# Patient Record
Sex: Male | Born: 1945 | Race: White | Hispanic: No | Marital: Married | Smoking: Former smoker
Health system: Southern US, Community
[De-identification: ages and names within clinical notes are randomized; demographics above are authoritative.]

## PROBLEM LIST (undated history)

## (undated) DIAGNOSIS — I252 Old myocardial infarction: Secondary | ICD-10-CM

## (undated) DIAGNOSIS — J309 Allergic rhinitis, unspecified: Secondary | ICD-10-CM

## (undated) DIAGNOSIS — G4733 Obstructive sleep apnea (adult) (pediatric): Secondary | ICD-10-CM

## (undated) DIAGNOSIS — K219 Gastro-esophageal reflux disease without esophagitis: Secondary | ICD-10-CM

## (undated) DIAGNOSIS — E785 Hyperlipidemia, unspecified: Secondary | ICD-10-CM

## (undated) DIAGNOSIS — Z87442 Personal history of urinary calculi: Secondary | ICD-10-CM

## (undated) DIAGNOSIS — J1282 Pneumonia due to coronavirus disease 2019: Secondary | ICD-10-CM

## (undated) DIAGNOSIS — J45909 Unspecified asthma, uncomplicated: Secondary | ICD-10-CM

## (undated) DIAGNOSIS — I10 Essential (primary) hypertension: Secondary | ICD-10-CM

## (undated) DIAGNOSIS — U071 COVID-19: Secondary | ICD-10-CM

## (undated) DIAGNOSIS — Z955 Presence of coronary angioplasty implant and graft: Secondary | ICD-10-CM

## (undated) DIAGNOSIS — T7840XA Allergy, unspecified, initial encounter: Secondary | ICD-10-CM

## (undated) DIAGNOSIS — N189 Chronic kidney disease, unspecified: Secondary | ICD-10-CM

## (undated) DIAGNOSIS — R001 Bradycardia, unspecified: Secondary | ICD-10-CM

## (undated) DIAGNOSIS — S83206A Unspecified tear of unspecified meniscus, current injury, right knee, initial encounter: Secondary | ICD-10-CM

## (undated) DIAGNOSIS — Z808 Family history of malignant neoplasm of other organs or systems: Secondary | ICD-10-CM

## (undated) DIAGNOSIS — Z87898 Personal history of other specified conditions: Secondary | ICD-10-CM

## (undated) DIAGNOSIS — H269 Unspecified cataract: Secondary | ICD-10-CM

## (undated) DIAGNOSIS — G4730 Sleep apnea, unspecified: Secondary | ICD-10-CM

## (undated) DIAGNOSIS — I2510 Atherosclerotic heart disease of native coronary artery without angina pectoris: Secondary | ICD-10-CM

## (undated) HISTORY — DX: Sleep apnea, unspecified: G47.30

## (undated) HISTORY — DX: Chronic kidney disease, unspecified: N18.9

## (undated) HISTORY — PX: LUMBAR SPINE SURGERY: SHX701

## (undated) HISTORY — PX: COLONOSCOPY: SHX174

## (undated) HISTORY — DX: Atherosclerotic heart disease of native coronary artery without angina pectoris: I25.10

## (undated) HISTORY — PX: TONSILLECTOMY: SUR1361

## (undated) HISTORY — DX: Unspecified cataract: H26.9

## (undated) HISTORY — PX: LITHOTRIPSY: SUR834

## (undated) HISTORY — DX: Allergy, unspecified, initial encounter: T78.40XA

## (undated) HISTORY — DX: Hyperlipidemia, unspecified: E78.5

## (undated) HISTORY — PX: EXTRACORPOREAL SHOCK WAVE LITHOTRIPSY: SHX1557

## (undated) HISTORY — DX: Pneumonia due to coronavirus disease 2019: J12.82

## (undated) HISTORY — DX: COVID-19: U07.1

## (undated) HISTORY — DX: Essential (primary) hypertension: I10

## (undated) HISTORY — DX: Family history of malignant neoplasm of other organs or systems: Z80.8

## (undated) HISTORY — PX: CATARACT EXTRACTION, BILATERAL: SHX1313

---

## 2004-04-17 ENCOUNTER — Ambulatory Visit: Payer: Self-pay | Admitting: Internal Medicine

## 2004-08-08 ENCOUNTER — Ambulatory Visit: Payer: Self-pay | Admitting: Internal Medicine

## 2005-01-10 ENCOUNTER — Ambulatory Visit: Payer: Self-pay | Admitting: Internal Medicine

## 2005-01-14 ENCOUNTER — Ambulatory Visit: Payer: Self-pay | Admitting: Internal Medicine

## 2006-02-02 ENCOUNTER — Ambulatory Visit: Payer: Self-pay | Admitting: Internal Medicine

## 2007-01-22 DIAGNOSIS — J309 Allergic rhinitis, unspecified: Secondary | ICD-10-CM | POA: Insufficient documentation

## 2007-01-22 DIAGNOSIS — K219 Gastro-esophageal reflux disease without esophagitis: Secondary | ICD-10-CM | POA: Insufficient documentation

## 2007-01-22 DIAGNOSIS — R05 Cough: Secondary | ICD-10-CM

## 2007-01-22 DIAGNOSIS — J45909 Unspecified asthma, uncomplicated: Secondary | ICD-10-CM | POA: Insufficient documentation

## 2007-01-25 ENCOUNTER — Encounter: Payer: Self-pay | Admitting: Internal Medicine

## 2007-02-25 ENCOUNTER — Ambulatory Visit: Payer: Self-pay | Admitting: Internal Medicine

## 2007-11-16 ENCOUNTER — Telehealth (INDEPENDENT_AMBULATORY_CARE_PROVIDER_SITE_OTHER): Payer: Self-pay | Admitting: *Deleted

## 2009-04-05 ENCOUNTER — Encounter: Payer: Self-pay | Admitting: Internal Medicine

## 2009-09-27 ENCOUNTER — Encounter: Admission: RE | Admit: 2009-09-27 | Discharge: 2009-09-27 | Payer: Self-pay | Admitting: Podiatry

## 2010-02-10 ENCOUNTER — Encounter: Payer: Self-pay | Admitting: Podiatry

## 2010-02-19 NOTE — Medication Information (Signed)
Summary: CIG[employer]  CIGNA   Imported By: Lester [place] 04/10/2009 10:15:38  _____________________________________________________________________  External Attachment:    Type:   Image     Comment:   External Document

## 2010-06-07 NOTE — Assessment & Plan Note (Signed)
[facility]                             PULMONARY OFFICE NOTE   NAME:[name], Berger                    MRN:          [id number]  DATE:02/02/2006                            DOB:          [birth year]    PROBLEM LIST:  1. Asthma.  2. Allergic rhinitis.  3. He dropped off of allergy vaccine since 6 months ago when the      pharmacy he used stopped carrying the particular size allergy      syringes he had been using. So far he has not had changes and I      would not expect any. He keeps a mild chronic nonproductive cough      which is unchanged. He denies nasal congestion and says he is not      aware of reflux although it was bad in the past. He usually      expects to cough a little more in the winter. 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   MEDICATIONS:  1. Advair 100/50.  2. Flonase.  3. Allegra D used only in the morning.  4. Occasional plain Allegra 60 mg.  5. Albuterol inhaler.   No medication allergy.   OBJECTIVE:  Weight 190 pounds, blood pressure 140/82, pulse regular 71,  room air saturation 98%. Lung fields sound clear. He seemed to clear his  throat once. Pharynx is not red, there was no stridor. No evident post  nasal drainage or nasal congestion. Heart sounds regular.   IMPRESSION:  1. Allergic asthma.  2. Allergic rhinitis.  3. Esophageal reflux.  4. Cough probably related to all of the above.   PLAN:  Dust and environmental precautions, reflux precautions. Refill  albuterol inhaler. Schedule return in 1 year, earlier p.r.n.     Clinton D. Maple Hudson, MD, Tonny Bollman, FACP  Electronically Signed    CDY/MedQ  DD: 02/02/2006  DT: 02/03/2006  Job #: [id number]   cc:   Wyvonnia Lora

## 2011-01-21 HISTORY — PX: SHOULDER ARTHROSCOPY: SHX128

## 2013-01-20 DIAGNOSIS — I219 Acute myocardial infarction, unspecified: Secondary | ICD-10-CM

## 2013-01-20 HISTORY — DX: Acute myocardial infarction, unspecified: I21.9

## 2013-08-04 ENCOUNTER — Encounter (HOSPITAL_COMMUNITY): Payer: Self-pay | Admitting: Emergency Medicine

## 2013-08-04 ENCOUNTER — Emergency Department (HOSPITAL_COMMUNITY): Payer: Medicare HMO

## 2013-08-04 ENCOUNTER — Inpatient Hospital Stay (HOSPITAL_COMMUNITY)
Admission: EM | Admit: 2013-08-04 | Discharge: 2013-08-06 | DRG: 247 | Disposition: A | Discharge status: Home / Self Care | Payer: Medicare HMO | Attending: Internal Medicine | Admitting: Internal Medicine

## 2013-08-04 DIAGNOSIS — E785 Hyperlipidemia, unspecified: Secondary | ICD-10-CM | POA: Diagnosis present

## 2013-08-04 DIAGNOSIS — I214 Non-ST elevation (NSTEMI) myocardial infarction: Principal | ICD-10-CM | POA: Diagnosis present

## 2013-08-04 DIAGNOSIS — R0789 Other chest pain: Secondary | ICD-10-CM | POA: Diagnosis present

## 2013-08-04 DIAGNOSIS — E876 Hypokalemia: Secondary | ICD-10-CM | POA: Diagnosis not present

## 2013-08-04 DIAGNOSIS — Z955 Presence of coronary angioplasty implant and graft: Secondary | ICD-10-CM

## 2013-08-04 DIAGNOSIS — K219 Gastro-esophageal reflux disease without esophagitis: Secondary | ICD-10-CM | POA: Diagnosis present

## 2013-08-04 DIAGNOSIS — R059 Cough, unspecified: Secondary | ICD-10-CM

## 2013-08-04 DIAGNOSIS — I2510 Atherosclerotic heart disease of native coronary artery without angina pectoris: Secondary | ICD-10-CM | POA: Diagnosis present

## 2013-08-04 DIAGNOSIS — I25110 Atherosclerotic heart disease of native coronary artery with unstable angina pectoris: Secondary | ICD-10-CM

## 2013-08-04 DIAGNOSIS — E7800 Pure hypercholesterolemia, unspecified: Secondary | ICD-10-CM | POA: Diagnosis present

## 2013-08-04 DIAGNOSIS — R079 Chest pain, unspecified: Secondary | ICD-10-CM

## 2013-08-04 DIAGNOSIS — R072 Precordial pain: Secondary | ICD-10-CM

## 2013-08-04 DIAGNOSIS — I252 Old myocardial infarction: Secondary | ICD-10-CM

## 2013-08-04 DIAGNOSIS — J45909 Unspecified asthma, uncomplicated: Secondary | ICD-10-CM | POA: Diagnosis present

## 2013-08-04 DIAGNOSIS — J309 Allergic rhinitis, unspecified: Secondary | ICD-10-CM

## 2013-08-04 DIAGNOSIS — Z8249 Family history of ischemic heart disease and other diseases of the circulatory system: Secondary | ICD-10-CM

## 2013-08-04 DIAGNOSIS — R05 Cough: Secondary | ICD-10-CM

## 2013-08-04 LAB — CBC
HEMATOCRIT: 43.4 % (ref 39.0–52.0)
HEMATOCRIT: 42.9 % (ref 39.0–52.0)
HEMOGLOBIN: 14.8 g/dL (ref 13.0–17.0)
Hemoglobin: 15.3 g/dL (ref 13.0–17.0)
MCH: 29.1 pg (ref 26.0–34.0)
MCH: 28.6 pg (ref 26.0–34.0)
MCHC: 35.3 g/dL (ref 30.0–36.0)
MCHC: 34.5 g/dL (ref 30.0–36.0)
MCV: 82.7 fL (ref 78.0–100.0)
MCV: 83 fL (ref 78.0–100.0)
PLATELETS: 213 10*3/uL (ref 150–400)
Platelets: 206 10*3/uL (ref 150–400)
RBC: 5.25 MIL/uL (ref 4.22–5.81)
RBC: 5.17 MIL/uL (ref 4.22–5.81)
RDW: 12.8 % (ref 11.5–15.5)
RDW: 12.8 % (ref 11.5–15.5)
WBC: 9.7 10*3/uL (ref 4.0–10.5)
WBC: 11.6 10*3/uL — ABNORMAL HIGH (ref 4.0–10.5)

## 2013-08-04 LAB — BASIC METABOLIC PANEL
Anion gap: 16 — ABNORMAL HIGH (ref 5–15)
BUN: 14 mg/dL (ref 6–23)
CALCIUM: 9.5 mg/dL (ref 8.4–10.5)
CO2: 24 mEq/L (ref 19–32)
Chloride: 103 mEq/L (ref 96–112)
Creatinine, Ser: 1.11 mg/dL (ref 0.50–1.35)
GFR calc Af Amer: 79 mL/min — ABNORMAL LOW (ref >90)
GFR, EST NON AFRICAN AMERICAN: 68 mL/min — AB (ref >90)
Glucose, Bld: 105 mg/dL — ABNORMAL HIGH (ref 70–99)
Potassium: 3.5 mEq/L — ABNORMAL LOW (ref 3.7–5.3)
Sodium: 143 mEq/L (ref 137–147)

## 2013-08-04 LAB — LIPID PANEL
CHOLESTEROL: 233 mg/dL — AB (ref 0–200)
HDL: 38 mg/dL — ABNORMAL LOW (ref >39)
LDL Cholesterol: 154 mg/dL — ABNORMAL HIGH (ref 0–99)
TRIGLYCERIDES: 204 mg/dL — AB (ref <150)
Total CHOL/HDL Ratio: 6.1 RATIO
VLDL: 41 mg/dL — AB (ref 0–40)

## 2013-08-04 LAB — CREATININE, SERUM
Creatinine, Ser: 1.07 mg/dL (ref 0.50–1.35)
GFR calc Af Amer: 82 mL/min — ABNORMAL LOW (ref >90)
GFR calc non Af Amer: 71 mL/min — ABNORMAL LOW (ref >90)

## 2013-08-04 LAB — TROPONIN I: Troponin I: 3.54 ng/mL (ref <0.30)

## 2013-08-04 LAB — PRO B NATRIURETIC PEPTIDE: Pro B Natriuretic peptide (BNP): 83.4 pg/mL (ref 0–125)

## 2013-08-04 LAB — I-STAT TROPONIN, ED: Troponin i, poc: 0.07 ng/mL (ref 0.00–0.08)

## 2013-08-04 LAB — TSH: TSH: 3.33 u[IU]/mL (ref 0.350–4.500)

## 2013-08-04 LAB — MAGNESIUM: Magnesium: 1.9 mg/dL (ref 1.5–2.5)

## 2013-08-04 MED ORDER — ONDANSETRON HCL 4 MG/2ML IJ SOLN
4.0000 mg | Freq: Four times a day (QID) | INTRAMUSCULAR | Status: DC | PRN
Start: 2013-08-04 — End: 2013-08-06

## 2013-08-04 MED ORDER — POTASSIUM CHLORIDE CRYS ER 20 MEQ PO TBCR
40.0000 meq | EXTENDED_RELEASE_TABLET | Freq: Once | ORAL | Status: AC
  Administered 2013-08-04: 40 meq via ORAL
  Filled 2013-08-04: qty 2

## 2013-08-04 MED ORDER — METOPROLOL TARTRATE 12.5 MG HALF TABLET
12.5000 mg | ORAL_TABLET | Freq: Two times a day (BID) | ORAL | Status: DC
  Administered 2013-08-04 – 2013-08-06 (×4): 12.5 mg via ORAL
  Filled 2013-08-04 (×6): qty 1

## 2013-08-04 MED ORDER — ATORVASTATIN CALCIUM 80 MG PO TABS
80.0000 mg | ORAL_TABLET | Freq: Every day | ORAL | Status: DC
  Administered 2013-08-04: 80 mg via ORAL
  Filled 2013-08-04 (×3): qty 1

## 2013-08-04 MED ORDER — ASPIRIN 81 MG PO CHEW
162.0000 mg | CHEWABLE_TABLET | Freq: Once | ORAL | Status: AC
  Administered 2013-08-04: 162 mg via ORAL
  Filled 2013-08-04: qty 2

## 2013-08-04 MED ORDER — HEPARIN BOLUS VIA INFUSION
4000.0000 [IU] | Freq: Once | INTRAVENOUS | Status: AC
Start: 2013-08-04 — End: 2013-08-04
  Administered 2013-08-04: 4000 [IU] via INTRAVENOUS
  Filled 2013-08-04: qty 4000

## 2013-08-04 MED ORDER — HEPARIN (PORCINE) IN NACL 100-0.45 UNIT/ML-% IJ SOLN
1200.0000 [IU]/h | INTRAMUSCULAR | Status: DC
  Administered 2013-08-04: 1200 [IU]/h via INTRAVENOUS
  Filled 2013-08-04 (×2): qty 250

## 2013-08-04 MED ORDER — NITROGLYCERIN 0.4 MG SL SUBL
0.4000 mg | SUBLINGUAL_TABLET | SUBLINGUAL | Status: DC | PRN
  Administered 2013-08-04 (×2): 0.4 mg via SUBLINGUAL
  Filled 2013-08-04: qty 1

## 2013-08-04 MED ORDER — ASPIRIN EC 325 MG PO TBEC: 325.0000 mg | DELAYED_RELEASE_TABLET | Freq: Every day | ORAL | Status: DC

## 2013-08-04 MED ORDER — ZOLPIDEM TARTRATE 5 MG PO TABS
5.0000 mg | ORAL_TABLET | Freq: Every evening | ORAL | Status: DC | PRN
  Administered 2013-08-04: 5 mg via ORAL
  Filled 2013-08-04: qty 1

## 2013-08-04 MED ORDER — HEPARIN SODIUM (PORCINE) 5000 UNIT/ML IJ SOLN: 5000.0000 [IU] | Freq: Three times a day (TID) | INTRAMUSCULAR | Status: DC

## 2013-08-04 MED ORDER — ACETAMINOPHEN 325 MG PO TABS: 650.0000 mg | ORAL_TABLET | ORAL | Status: DC | PRN

## 2013-08-04 NOTE — Consult Note (Signed)
CONSULTATION NOTE  Reason for Consult: Chest pain  Requesting Physician: Dr. Ernestina Patches  Cardiologist: None (NEW)  HPI: This is a 68 y.o. male with a past medical history significant for hyperlipidemia. He presents today with chest pain and pressure while working under his house. He felt somewhat dehydrated and try to  Drink some water but this did not improve his symptoms. He developed progressive onset of nausea shortness of breath and sweatiness. He was given aspirin with some relief in his symptoms and brought to the emergency room with concern for  Unstable angina.  His symptoms have improved once reaching the emergency room. No specific abnormalities were noted on his blood work. His first troponin was negative. There is subtle less than 1 mm ST depression anterolaterally on his EKG.  He was given sublingual nitroglycerin on arrival to the ER which totally resolved his chest pressure.  There is a family history of heart disease in his mother and sister.  PMHx:  History reviewed. No pertinent past medical history. Past Surgical History  Procedure Laterality Date  . Back surgery      FAMHx: Family History  Problem Relation Age of Onset  . Coronary artery disease Mother     CABG  . CAD Sister   . Alzheimer's disease Father     SOCHx:  reports that he has never smoked. He does not have any smokeless tobacco history on file. He reports that he does not drink alcohol. His drug history is not on file.  ALLERGIES: Allergies  Allergen Reactions  . Percocet [Oxycodone-Acetaminophen]     Nausea     ROS: A comprehensive review of systems was negative except for: Cardiovascular: positive for chest pressure/discomfort Gastrointestinal: positive for nausea and diaphoresis  HOME MEDICATIONS:   Medication List    ASK your doctor about these medications       zolpidem 10 MG tablet  Commonly known as:  AMBIEN  Take 10 mg by mouth at bedtime.        HOSPITAL  MEDICATIONS: Prior to Admission:  (Not in a hospital admission)  VITALS: Blood pressure 141/68, pulse 59, temperature 97.6 F (36.4 C), temperature source Oral, resp. rate 20, SpO2 100.00%.  PHYSICAL EXAM: General appearance: alert and no distress Neck: no carotid bruit and no JVD Lungs: clear to auscultation bilaterally Heart: regular rate and rhythm, S1, S2 normal, no murmur, click, rub or gallop Abdomen: soft, non-tender; bowel sounds normal; no masses,  no organomegaly Extremities: extremities normal, atraumatic, no cyanosis or edema Pulses: 2+ and symmetric Skin: Skin color, texture, turgor normal. No rashes or lesions Neurologic: Grossly normal Psych: Normal  LABS: Results for orders placed during the hospital encounter of 08/04/13 (from the past 48 hour(s))  CBC     Status: None   Collection Time    08/04/13  4:08 PM      Result Value Ref Range   WBC 9.7  4.0 - 10.5 K/uL   RBC 5.25  4.22 - 5.81 MIL/uL   Hemoglobin 15.3  13.0 - 17.0 g/dL   HCT 43.4  39.0 - 52.0 %   MCV 82.7  78.0 - 100.0 fL   MCH 29.1  26.0 - 34.0 pg   MCHC 35.3  30.0 - 36.0 g/dL   RDW 12.8  11.5 - 15.5 %   Platelets 213  150 - 400 K/uL  BASIC METABOLIC PANEL     Status: Abnormal   Collection Time    08/04/13  4:08 PM  Result Value Ref Range   Sodium 143  137 - 147 mEq/L   Potassium 3.5 (*) 3.7 - 5.3 mEq/L   Chloride 103  96 - 112 mEq/L   CO2 24  19 - 32 mEq/L   Glucose, Bld 105 (*) 70 - 99 mg/dL   BUN 14  6 - 23 mg/dL   Creatinine, Ser 1.11  0.50 - 1.35 mg/dL   Calcium 9.5  8.4 - 10.5 mg/dL   GFR calc non Af Amer 68 (*) >90 mL/min   GFR calc Af Amer 79 (*) >90 mL/min   Comment: (NOTE)     The eGFR has been calculated using the CKD EPI equation.     This calculation has not been validated in all clinical situations.     eGFR's persistently <90 mL/min signify possible Chronic Kidney     Disease.   Anion gap 16 (*) 5 - 15  PRO B NATRIURETIC PEPTIDE     Status: None   Collection Time     08/04/13  4:08 PM      Result Value Ref Range   Pro B Natriuretic peptide (BNP) 83.4  0 - 125 pg/mL  I-STAT TROPOININ, ED     Status: None   Collection Time    08/04/13  4:13 PM      Result Value Ref Range   Troponin i, poc 0.07  0.00 - 0.08 ng/mL   Comment 3            Comment: Due to the release kinetics of cTnI,     a negative result within the first hours     of the onset of symptoms does not rule out     myocardial infarction with certainty.     If myocardial infarction is still suspected,     repeat the test at appropriate intervals.  CBC     Status: Abnormal   Collection Time    08/04/13  7:40 PM      Result Value Ref Range   WBC 11.6 (*) 4.0 - 10.5 K/uL   RBC 5.17  4.22 - 5.81 MIL/uL   Hemoglobin 14.8  13.0 - 17.0 g/dL   HCT 42.9  39.0 - 52.0 %   MCV 83.0  78.0 - 100.0 fL   MCH 28.6  26.0 - 34.0 pg   MCHC 34.5  30.0 - 36.0 g/dL   RDW 12.8  11.5 - 15.5 %   Platelets 206  150 - 400 K/uL    IMAGING: Dg Chest 2 View  08/04/2013   CLINICAL DATA:  Chest pain, shortness of breath  EXAM: CHEST  2 VIEW  COMPARISON:  None.  FINDINGS: Normal cardiac silhouette and mediastinal contours. There is mild elevation of the right hemidiaphragm. No focal parenchymal opacities. No pleural effusion or pneumothorax. No evidence of edema. No acute osseus abnormalities.  IMPRESSION: No acute cardiopulmonary disease.   Electronically Signed   By: Sandi Mariscal M.D.   On: 08/04/2013 18:10    HOSPITAL DIAGNOSES: Principal Problem:   Chest pain   IMPRESSION: 1. Chest pain, symptoms concerning for possible unstable angina  RECOMMENDATION: 1. Mr. 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 The symptoms were somewhat relieved with aspirin and totally relieved with 2 nitroglycerin. EKG shows subtle anterolateral ST depression. I agree with admission to evaluate for ACS. I would hold off on heparin for now unless he develops chest pain, positive troponin or  dynamic  EKG changes. Please keep NPO p MN for myoview stress test (if cardiac enzymes are negative) or cardiac catheterization if he rules-in.  Thanks for consulting [place].  Time Spent Directly with Patient: 45 minutes  Pixie Casino, MD, [facility] Attending Cardiologist [facility]  Ranald Alessio C 08/04/2013, 7:59 PM

## 2013-08-04 NOTE — ED Notes (Signed)
Dr. Walton at bedside 

## 2013-08-04 NOTE — Progress Notes (Signed)
CRITICAL VALUE ALERT  Critical value received: troponin 3.54  Date of notification:  08/04/2013  Time of notification:  2034  Critical value read back:Yes.    Nurse who received alert:  Nevin Bloodgood  MD notified (1st page):  Monica Becton  Time of first page:  2035  MD notified (2nd page):  Time of second page:  Responding MD:  Jules Husbands, MD  Time MD responded:  2038  New orders to start the pt on Heparin Iv. Will continue to monitor the pt. Hoover Brunette, RN

## 2013-08-04 NOTE — H&P (Signed)
Hospitalist Admission History and Physical  Patient name: Ronald Berger Medical record number: [id number] Date of birth: [birth year] Age: 68 y.o. Gender: male  Primary Care Provider: No primary provider on file.  Chief Complaint: chest pain  History of Present Illness:This is a 68 y.o. year old male with significant past medical history of HLD  presenting with chest pain. 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 Stopped working and drank some water. Pt states that sxs seemed to worsen over this time frame with progressive onset of nausea, dyspnea and diaphoresis. Contacted his wife and son about this time. Son arrived and gave him ASA with some improvement in sxs. Was brought to the ER for further evaluation. Pt denies any prior hx/o sxs like this in the past. CP is predominantly central without radiation and was initally fairly constant.  On presentation, hemodynamically stable. Blood work essentially WNL. Trop neg x1. Initial EKG with faint t wave abnormalities in lateral leads. Was given sub lingual NTG x2 with near resolution of chest pressure.  Heart score: 6 CV RFs: age, hx/o HLD, + family history   Assessment and Plan: Ronald Berger is a 68 y.o. year old male presenting with Chest Pain   Chest Pain: fairly typical sxs on presentation. No active CP currently. Full dose ASA. Prn NTG. Cards consult pending. Risk stratification labs. Telemetry bed.   FEN/GI: heart healthy diet. NPO PMN Prophylaxis: sub q heparin  Disposition: pending further evaluation  Code Status:Full code    Patient Active Problem List   Diagnosis Date Noted  . Chest pain 08/04/2013  . ALLERGIC RHINITIS 01/22/2007  . ASTHMA 01/22/2007  . ESOPHAGEAL REFLUX 01/22/2007  . COUGH 01/22/2007   Past Medical History: History reviewed. No pertinent past medical history.  Past Surgical History: Past Surgical History  Procedure  Laterality Date  . Back surgery      Social History: History   Social History  . Marital Status: Married    Spouse Name: N/A    Number of Children: N/A  . Years of Education: N/A   Social History Main Topics  . Smoking status: Never Smoker   . Smokeless tobacco: None  . Alcohol Use: No  . Drug Use: None  . Sexual Activity: None   Other Topics Concern  . None   Social History Narrative  . None    Family History: No family history on file.  Allergies: Allergies  Allergen Reactions  . Percocet [Oxycodone-Acetaminophen]     Nausea     Current Facility-Administered Medications  Medication Dose Route Frequency Provider Last Rate Last Dose  . acetaminophen (TYLENOL) tablet 650 mg  650 mg Oral Q4H PRN Shanda Howells, MD      . aspirin EC tablet 325 mg  325 mg Oral Daily Shanda Howells, MD      . heparin injection 5,000 Units  5,000 Units Subcutaneous 3 times per day Shanda Howells, MD      . nitroGLYCERIN (NITROSTAT) SL tablet 0.4 mg  0.4 mg Sublingual Q5 min PRN Freddi Che, MD   0.4 mg at 08/04/13 1832  . ondansetron (ZOFRAN) injection 4 mg  4 mg Intravenous Q6H PRN Shanda Howells, MD       Current Outpatient Prescriptions  Medication Sig Dispense Refill  . zolpidem (AMBIEN) 10 MG tablet Take 10 mg by mouth at bedtime.       Review Of Systems: 12 point ROS negative except as noted  above in HPI.  Physical Exam: Filed Vitals:   08/04/13 1815  BP: 143/81  Pulse: 52  Temp:   Resp: 8    General: alert and cooperative HEENT: PERRLA and extra ocular movement intact Heart: S1, S2 normal, no murmur, rub or gallop, regular rate and rhythm Lungs: clear to auscultation, no wheezes or rales and unlabored breathing Abdomen: abdomen is soft without significant tenderness, masses, organomegaly or guarding Extremities: extremities normal, atraumatic, no cyanosis or edema Skin:no rashes Neurology: normal without focal findings  Labs and Imaging: Lab Results  Component  Value Date/Time   NA 143 08/04/2013  4:08 PM   K 3.5* 08/04/2013  4:08 PM   CL 103 08/04/2013  4:08 PM   CO2 24 08/04/2013  4:08 PM   BUN 14 08/04/2013  4:08 PM   CREATININE 1.11 08/04/2013  4:08 PM   GLUCOSE 105* 08/04/2013  4:08 PM   Lab Results  Component Value Date   WBC 9.7 08/04/2013   HGB 15.3 08/04/2013   HCT 43.4 08/04/2013   MCV 82.7 08/04/2013   PLT 213 08/04/2013    Dg Chest 2 View  08/04/2013   CLINICAL DATA:  Chest pain, shortness of breath  EXAM: CHEST  2 VIEW  COMPARISON:  None.  FINDINGS: Normal cardiac silhouette and mediastinal contours. There is mild elevation of the right hemidiaphragm. No focal parenchymal opacities. No pleural effusion or pneumothorax. No evidence of edema. No acute osseus abnormalities.  IMPRESSION: No acute cardiopulmonary disease.   Electronically Signed   By: Sandi Mariscal M.D.   On: 08/04/2013 18:10           Shanda Howells MD  Pager: [phone]

## 2013-08-04 NOTE — ED Provider Notes (Signed)
CSN: [id number]     Arrival date & time 08/04/13  1543 History   First MD Initiated Contact with Patient 08/04/13 1702     Chief Complaint  Patient presents with  . Chest Pain     (Consider location/radiation/quality/duration/timing/severity/associated sxs/prior Treatment) Patient is a 68 y.o. male presenting with chest pain.  Chest Pain Pain location:  Substernal area and R chest Pain quality: pressure   Pain radiates to:  Mid back Pain radiates to the back: yes   Pain severity:  Severe Onset quality:  Gradual Timing:  Constant Progression:  Partially resolved Chronicity:  New Relieved by:  None tried Associated symptoms: diaphoresis, nausea and shortness of breath   Associated symptoms: no abdominal pain, no back pain, no cough, no fever, no headache and not vomiting   Risk factors: male sex   Risk factors: no coronary artery disease, no diabetes mellitus, no high cholesterol, no hypertension, no prior DVT/PE and no smoking     History reviewed. No pertinent past medical history. Past Surgical History  Procedure Laterality Date  . Back surgery     Family History  Problem Relation Age of Onset  . Coronary artery disease Mother     CABG  . CAD Sister   . Alzheimer's disease Father    History  Substance Use Topics  . Smoking status: Never Smoker   . Smokeless tobacco: Not on file  . Alcohol Use: No    Review of Systems  Constitutional: Positive for diaphoresis. Negative for fever and chills.  HENT: Negative for sore throat.   Eyes: Negative for pain.  Respiratory: Positive for shortness of breath. Negative for cough.   Cardiovascular: Positive for chest pain.  Gastrointestinal: Positive for nausea. Negative for vomiting and abdominal pain.  Genitourinary: Negative for dysuria and flank pain.  Musculoskeletal: Negative for back pain and neck pain.  Skin: Negative for rash.  Neurological: Negative for seizures and headaches.      Allergies  Percocet  Home  Medications   Prior to Admission medications   Medication Sig Start Date End Date Taking? Authorizing Provider  zolpidem (AMBIEN) 10 MG tablet Take 10 mg by mouth at bedtime.   Yes Historical Provider, MD   BP 168/82  Pulse 59  Temp(Src) 97.7 F (36.5 C) (Oral)  Resp 20  Ht 5\' 9"  (1.753 m)  Wt 197 lb 4.8 oz (89.495 kg)  BMI 29.12 kg/m2  SpO2 99% Physical Exam  Constitutional: He is oriented to person, place, and time. He appears well-developed and well-nourished. No distress.  HENT:  Head: Normocephalic and atraumatic.  Eyes: Pupils are equal, round, and reactive to light.  Neck: Normal range of motion.  Cardiovascular: Normal rate and regular rhythm.   Pulmonary/Chest: Effort normal and breath sounds normal.  Abdominal: Soft. He exhibits no distension. There is no tenderness.  Musculoskeletal: Normal range of motion.  Neurological: He is alert and oriented to person, place, and time.  Skin: Skin is warm. He is not diaphoretic.    ED Course  Procedures (including critical care time) Labs Review Labs Reviewed  BASIC METABOLIC PANEL - Abnormal; Notable for the following:    Potassium 3.5 (*)    Glucose, Bld 105 (*)    GFR calc non Af Amer 68 (*)    GFR calc Af Amer 79 (*)    Anion gap 16 (*)    All other components within normal limits  TROPONIN I - Abnormal; Notable for the following:    Troponin I  3.54 (*)    All other components within normal limits  CBC - Abnormal; Notable for the following:    WBC 11.6 (*)    All other components within normal limits  CREATININE, SERUM - Abnormal; Notable for the following:    GFR calc non Af Amer 71 (*)    GFR calc Af Amer 82 (*)    All other components within normal limits  LIPID PANEL - Abnormal; Notable for the following:    Cholesterol 233 (*)    Triglycerides 204 (*)    HDL 38 (*)    VLDL 41 (*)    LDL Cholesterol 154 (*)    All other components within normal limits  CBC  PRO B NATRIURETIC PEPTIDE  TSH  MAGNESIUM   HEMOGLOBIN A1C  HEPARIN LEVEL (UNFRACTIONATED)  CBC  TROPONIN I  TROPONIN I  I-STAT TROPOININ, ED    Imaging Review Dg Chest 2 View  08/04/2013   CLINICAL DATA:  Chest pain, shortness of breath  EXAM: CHEST  2 VIEW  COMPARISON:  None.  FINDINGS: Normal cardiac silhouette and mediastinal contours. There is mild elevation of the right hemidiaphragm. No focal parenchymal opacities. No pleural effusion or pneumothorax. No evidence of edema. No acute osseus abnormalities.  IMPRESSION: No acute cardiopulmonary disease.   Electronically Signed   By: Sandi Mariscal M.D.   On: 08/04/2013 18:10     EKG Interpretation   Date/Time:  Thursday August 04 2013 15:44:11 EDT Ventricular Rate:  69 PR Interval:  174 QRS Duration: 94 QT Interval:  370 QTC Calculation: 396 R Axis:   75 Text Interpretation:  Normal sinus rhythm Nonspecific ST abnormality  Abnormal ECG Confirmed by RAY MD, Andee Poles [phone]) on 08/04/2013 5:27:25 PM      MDM   Final diagnoses:  Precordial pain   68 year old male with no significant past medical history(no history of hypertension, hyperlipidemia, diabetes, smoking), who presents today for chest pain that started while working. Patient describes the chest pain has pressure-like, with associated nausea, diaphoresis.   Patient has no chest pain upon arrival.  Patient is hemodynamically stable. Patient is a normal heart rate and a normal blood pressure. He is afebrile. Initial EKG demonstrates nonspecific ST abnormalities in the lateral leads. Patient given aspirin. Nitroglycerin held as the patient is not having any chest pain at this time.   First troponin is negative.  Given the patient's age and description of his chest pain, concern for possible ACS. Will admit to the hospitalist for ACS rule out. Consulted with cardiology, who agree with that plan. Patient was admitted to the floor in stable condition with no chest pain. Patient was seen and evaluated by myself and by the  attending Dr. Jeanell Sparrow.    Freddi Che, MD 08/05/13 0040

## 2013-08-04 NOTE — ED Notes (Signed)
Patient returned from xray.

## 2013-08-04 NOTE — Progress Notes (Addendum)
Brief Cardiology Note  Primary RN notified me Ronald Berger troponin came back at 3.5. He's currently Cp free. ECG reviewed from late afternoon with ST depression V2-V5 with NSR. Cr 1.1. Vitals reviewed; BP 141/68  Pulse 59  Temp(Src) 97.6 F (36.4 C) (Oral)  Resp 20  Ht 5\' 9"  (1.753 m)  Wt 89.495 kg (197 lb 4.8 oz)  BMI 29.12 kg/m2  SpO2 100%Will start IV heparin. No known current bleeding risk, stable h/h. Continue NPO. Likely LHC in AM. Updated Ronald Berger of status of apparent NSTEMI with likely LHC in AM. Will also start atorvastatin 80 mg first dose now and low dose metoprolol 12.5 mg bid.  Jules Husbands, MD

## 2013-08-04 NOTE — ED Notes (Signed)
Pt states that he began having central chest pain today that was constant while working outside. Pt took asprin at home.

## 2013-08-04 NOTE — Progress Notes (Signed)
ANTICOAGULATION CONSULT NOTE - Initial Consult  Pharmacy Consult for Heparin Indication: chest pain/ACS  Allergies  Allergen Reactions  . Percocet [Oxycodone-Acetaminophen]     Nausea     Patient Measurements: Height: 5\' 9"  (175.3 cm) Weight: 197 lb 4.8 oz (89.495 kg) IBW/kg (Calculated) : 70.7 Heparin Dosing Weight: 89 kg  Vital Signs: Temp: 97.7 F (36.5 C) (07/16 2100) Temp src: Oral (07/16 2100) BP: 168/82 mmHg (07/16 2100) Pulse Rate: 59 (07/16 2100)  Labs:  Recent Labs  08/04/13 1608 08/04/13 1940  HGB 15.3 14.8  HCT 43.4 42.9  PLT 213 206  CREATININE 1.11 1.07  TROPONINI  --  3.54*    Estimated Creatinine Clearance: 76.1 ml/min (by C-G formula based on Cr of 1.07).   Medical History: History reviewed. No pertinent past medical history.  Medications:  SQ heparin - no doses given.  No anticoagulation prior to admission reported.   Assessment: 77 YOM admitted for chest pain with a troponin of 3.54 and ST depression on EKG to start IV heparin.  CBc stable. No bleeding reported. SCr 1.07/estCrCl~76 mL/min.   Goal of Therapy:  Heparin level 0.3-0.7 units/ml Monitor platelets by anticoagulation protocol: Yes   Plan:  1. Heparin bolus of 4000 units x1. 2. Heparin drip at 1200 units/hr.  3. Heparin level in 6 hours- ok with AM labs. 4. Daily heparin level and CBC.   Sloan Leiter, PharmD, BCPS Clinical Pharmacist [phone] 08/04/2013,9:17 PM

## 2013-08-05 ENCOUNTER — Encounter (HOSPITAL_COMMUNITY)
Admission: EM | Disposition: A | Discharge status: Home / Self Care | Payer: Medicare HMO | Source: Home / Self Care | Attending: Family Medicine

## 2013-08-05 ENCOUNTER — Encounter (HOSPITAL_COMMUNITY): Payer: Self-pay | Admitting: *Deleted

## 2013-08-05 DIAGNOSIS — I2510 Atherosclerotic heart disease of native coronary artery without angina pectoris: Secondary | ICD-10-CM

## 2013-08-05 DIAGNOSIS — I214 Non-ST elevation (NSTEMI) myocardial infarction: Secondary | ICD-10-CM

## 2013-08-05 DIAGNOSIS — J45909 Unspecified asthma, uncomplicated: Secondary | ICD-10-CM | POA: Diagnosis present

## 2013-08-05 DIAGNOSIS — Z8249 Family history of ischemic heart disease and other diseases of the circulatory system: Secondary | ICD-10-CM | POA: Diagnosis not present

## 2013-08-05 DIAGNOSIS — E7800 Pure hypercholesterolemia, unspecified: Secondary | ICD-10-CM | POA: Diagnosis present

## 2013-08-05 DIAGNOSIS — E876 Hypokalemia: Secondary | ICD-10-CM | POA: Diagnosis not present

## 2013-08-05 DIAGNOSIS — K219 Gastro-esophageal reflux disease without esophagitis: Secondary | ICD-10-CM | POA: Diagnosis present

## 2013-08-05 DIAGNOSIS — R072 Precordial pain: Secondary | ICD-10-CM

## 2013-08-05 DIAGNOSIS — E785 Hyperlipidemia, unspecified: Secondary | ICD-10-CM | POA: Diagnosis present

## 2013-08-05 DIAGNOSIS — I252 Old myocardial infarction: Secondary | ICD-10-CM

## 2013-08-05 HISTORY — PX: LEFT HEART CATHETERIZATION WITH CORONARY ANGIOGRAM: SHX5451

## 2013-08-05 LAB — CBC
HCT: 42.5 % (ref 39.0–52.0)
Hemoglobin: 14.3 g/dL (ref 13.0–17.0)
MCH: 28.5 pg (ref 26.0–34.0)
MCHC: 33.6 g/dL (ref 30.0–36.0)
MCV: 84.8 fL (ref 78.0–100.0)
PLATELETS: 192 10*3/uL (ref 150–400)
RBC: 5.01 MIL/uL (ref 4.22–5.81)
RDW: 13.1 % (ref 11.5–15.5)
WBC: 9.7 10*3/uL (ref 4.0–10.5)

## 2013-08-05 LAB — BASIC METABOLIC PANEL
ANION GAP: 12 (ref 5–15)
BUN: 15 mg/dL (ref 6–23)
CALCIUM: 8.8 mg/dL (ref 8.4–10.5)
CHLORIDE: 106 meq/L (ref 96–112)
CO2: 24 meq/L (ref 19–32)
Creatinine, Ser: 1.15 mg/dL (ref 0.50–1.35)
GFR calc Af Amer: 75 mL/min — ABNORMAL LOW (ref >90)
GFR calc non Af Amer: 65 mL/min — ABNORMAL LOW (ref >90)
Glucose, Bld: 104 mg/dL — ABNORMAL HIGH (ref 70–99)
Potassium: 3.9 mEq/L (ref 3.7–5.3)
SODIUM: 142 meq/L (ref 137–147)

## 2013-08-05 LAB — TROPONIN I
TROPONIN I: 4.92 ng/mL — AB (ref <0.30)
Troponin I: 11.14 ng/mL (ref <0.30)
Troponin I: 10.5 ng/mL (ref <0.30)

## 2013-08-05 LAB — PROTIME-INR
INR: 1.16 (ref 0.00–1.49)
Prothrombin Time: 14.8 seconds (ref 11.6–15.2)

## 2013-08-05 LAB — HEMOGLOBIN A1C
Hgb A1c MFr Bld: 5.7 % — ABNORMAL HIGH (ref <5.7)
MEAN PLASMA GLUCOSE: 117 mg/dL — AB (ref <117)

## 2013-08-05 LAB — HEPARIN LEVEL (UNFRACTIONATED)
HEPARIN UNFRACTIONATED: 0.43 [IU]/mL (ref 0.30–0.70)
Heparin Unfractionated: 0.44 IU/mL (ref 0.30–0.70)
Heparin Unfractionated: 0.44 IU/mL (ref 0.30–0.70)

## 2013-08-05 LAB — POCT ACTIVATED CLOTTING TIME: ACTIVATED CLOTTING TIME: 602 s

## 2013-08-05 SURGERY — LEFT HEART CATHETERIZATION WITH CORONARY ANGIOGRAM
Anesthesia: LOCAL

## 2013-08-05 MED ORDER — ASPIRIN 81 MG PO CHEW: 81.0000 mg | CHEWABLE_TABLET | ORAL | Status: DC

## 2013-08-05 MED ORDER — SODIUM CHLORIDE 0.9 % IJ SOLN: 3.0000 mL | INTRAMUSCULAR | Status: DC | PRN

## 2013-08-05 MED ORDER — PRASUGREL HCL 10 MG PO TABS
10.0000 mg | ORAL_TABLET | Freq: Every day | ORAL | Status: DC
  Administered 2013-08-06: 11:00:00 10 mg via ORAL
  Filled 2013-08-05: qty 1

## 2013-08-05 MED ORDER — PRASUGREL HCL 10 MG PO TABS
ORAL_TABLET | ORAL | Status: AC
  Filled 2013-08-05: qty 1

## 2013-08-05 MED ORDER — SODIUM CHLORIDE 0.9 % IV SOLN: 1.0000 mL/kg/h | INTRAVENOUS | Status: AC

## 2013-08-05 MED ORDER — MIDAZOLAM HCL 2 MG/2ML IJ SOLN
INTRAMUSCULAR | Status: AC
  Filled 2013-08-05: qty 2

## 2013-08-05 MED ORDER — SODIUM CHLORIDE 0.9 % IV SOLN: 250.0000 mL | INTRAVENOUS | Status: DC | PRN

## 2013-08-05 MED ORDER — FENTANYL CITRATE 0.05 MG/ML IJ SOLN
INTRAMUSCULAR | Status: AC
  Filled 2013-08-05: qty 2

## 2013-08-05 MED ORDER — ASPIRIN 81 MG PO CHEW
81.0000 mg | CHEWABLE_TABLET | Freq: Every day | ORAL | Status: DC
  Administered 2013-08-06: 81 mg via ORAL
  Filled 2013-08-05: qty 1

## 2013-08-05 MED ORDER — SODIUM CHLORIDE 0.9 % IV SOLN
INTRAVENOUS | Status: DC
  Administered 2013-08-05: 08:00:00 via INTRAVENOUS

## 2013-08-05 MED ORDER — VERAPAMIL HCL 2.5 MG/ML IV SOLN
INTRAVENOUS | Status: AC
  Filled 2013-08-05: qty 2

## 2013-08-05 MED ORDER — BIVALIRUDIN 250 MG IV SOLR
INTRAVENOUS | Status: AC
  Filled 2013-08-05: qty 250

## 2013-08-05 MED ORDER — HEART ATTACK BOUNCING BOOK
Freq: Once | Status: AC
  Administered 2013-08-05: 21:00:00
  Filled 2013-08-05: qty 1

## 2013-08-05 MED ORDER — ZOLPIDEM TARTRATE 5 MG PO TABS
5.0000 mg | ORAL_TABLET | Freq: Every day | ORAL | Status: DC
  Administered 2013-08-05: 5 mg via ORAL
  Filled 2013-08-05: qty 1

## 2013-08-05 MED ORDER — HEPARIN SODIUM (PORCINE) 1000 UNIT/ML IJ SOLN
INTRAMUSCULAR | Status: AC
  Filled 2013-08-05: qty 1

## 2013-08-05 MED ORDER — PNEUMOCOCCAL VAC POLYVALENT 25 MCG/0.5ML IJ INJ
0.5000 mL | INJECTION | INTRAMUSCULAR | Status: AC
  Administered 2013-08-06: 0.5 mL via INTRAMUSCULAR
  Filled 2013-08-05: qty 0.5

## 2013-08-05 MED ORDER — ASPIRIN 81 MG PO CHEW
81.0000 mg | CHEWABLE_TABLET | ORAL | Status: AC
  Administered 2013-08-05: 81 mg via ORAL
  Filled 2013-08-05: qty 1

## 2013-08-05 MED ORDER — LIDOCAINE HCL (PF) 1 % IJ SOLN
INTRAMUSCULAR | Status: AC
  Filled 2013-08-05: qty 30

## 2013-08-05 MED ORDER — NITROGLYCERIN 0.2 MG/ML ON CALL CATH LAB
INTRAVENOUS | Status: AC
  Filled 2013-08-05: qty 1

## 2013-08-05 MED ORDER — NITROGLYCERIN 1 MG/10 ML FOR IR/CATH LAB
INTRA_ARTERIAL | Status: AC
  Filled 2013-08-05: qty 10

## 2013-08-05 MED ORDER — SODIUM CHLORIDE 0.9 % IJ SOLN: 3.0000 mL | Freq: Two times a day (BID) | INTRAMUSCULAR | Status: DC

## 2013-08-05 MED ORDER — HEPARIN (PORCINE) IN NACL 2-0.9 UNIT/ML-% IJ SOLN
INTRAMUSCULAR | Status: AC
  Filled 2013-08-05: qty 1500

## 2013-08-05 MED ORDER — SODIUM CHLORIDE 0.9 % IJ SOLN
3.0000 mL | Freq: Two times a day (BID) | INTRAMUSCULAR | Status: DC
  Administered 2013-08-05: 3 mL via INTRAVENOUS

## 2013-08-05 MED ORDER — SODIUM CHLORIDE 0.9 % IJ SOLN
3.0000 mL | INTRAMUSCULAR | Status: DC | PRN
Start: 2013-08-05 — End: 2013-08-06

## 2013-08-05 NOTE — Progress Notes (Signed)
In review of labs and notes, I have ordered left heart cath with possible PCI.  NSTEMI.   Dr. Debara Pickett note reviewed.   Candee Furbish, MD

## 2013-08-05 NOTE — Progress Notes (Signed)
[facility] PROGRESS NOTE  Ronald Berger OFB:[id number] DOB: Jun 19, 1945 DOA: 08/04/2013 PCP: Deloria Lair, MD  Assessment/Plan:  Principal Problem:   Chest pain/NSTEMI (non-ST elevated myocardial infarction) - Cardiology on board and managing  Active Problems:   ASTHMA - stable    Esophageal reflux - stable   Code Status: full Family Communication: Discussed with patient and family member at bedside. Disposition Plan: Pending further recommendations from specialist involved.   Consultants:  Cardiology  Procedures:  Cardiac cath  Antibiotics:  none (indicate start date, and stop date if known)  HPI/Subjective: Pt has no new complaints. Denies any chest pain  Objective: Filed Vitals:   08/05/13 1133  BP: 121/76  Pulse: 55  Temp: 97.9 F (36.6 C)  Resp: 19   No intake or output data in the 24 hours ending 08/05/13 1728 Filed Weights   08/04/13 2036  Weight: 89.495 kg (197 lb 4.8 oz)    Exam:   General:  Pt in nad, alert and awake  Cardiovascular: rrr, no rubs  Respiratory: cta bl, no wheezes  Abdomen: soft, Nt, nd  Musculoskeletal: no cyanosis or clubbing   Data Reviewed: Basic Metabolic Panel:  Recent Labs Lab 08/04/13 1608 08/04/13 1940 08/05/13 0727  NA 143  --  142  K 3.5*  --  3.9  CL 103  --  106  CO2 24  --  24  GLUCOSE 105*  --  104*  BUN 14  --  15  CREATININE 1.11 1.07 1.15  CALCIUM 9.5  --  8.8  MG  --  1.9  --    Liver Function Tests: No results found for this basename: AST, ALT, ALKPHOS, BILITOT, PROT, ALBUMIN,  in the last 168 hours No results found for this basename: LIPASE, AMYLASE,  in the last 168 hours No results found for this basename: AMMONIA,  in the last 168 hours CBC:  Recent Labs Lab 08/04/13 1608 08/04/13 1940 08/05/13 0030  WBC 9.7 11.6* 9.7  HGB 15.3 14.8 14.3  HCT 43.4 42.9 42.5  MCV 82.7 83.0 84.8  PLT 213 206 192   Cardiac Enzymes:  Recent Labs Lab 08/04/13 1940  08/05/13 0030 08/05/13 0727 08/05/13 1302  TROPONINI 3.54* 11.14* 10.50* 4.92*   BNP (last 3 results)  Recent Labs  08/04/13 1608  PROBNP 83.4   CBG: No results found for this basename: GLUCAP,  in the last 168 hours  No results found for this or any previous visit (from the past 240 hour(s)).   Studies: Dg Chest 2 View  08/04/2013   CLINICAL DATA:  Chest pain, shortness of breath  EXAM: CHEST  2 VIEW  COMPARISON:  None.  FINDINGS: Normal cardiac silhouette and mediastinal contours. There is mild elevation of the right hemidiaphragm. No focal parenchymal opacities. No pleural effusion or pneumothorax. No evidence of edema. No acute osseus abnormalities.  IMPRESSION: No acute cardiopulmonary disease.   Electronically Signed   By: Sandi Mariscal M.D.   On: 08/04/2013 18:10    Scheduled Meds: . aspirin EC  325 mg Oral Daily  . atorvastatin  80 mg Oral q1800  . metoprolol tartrate  12.5 mg Oral BID  . [START ON 08/06/2013] pneumococcal 23 valent vaccine  0.5 mL Intramuscular Tomorrow-1000  . sodium chloride  3 mL Intravenous Q12H   Continuous Infusions: . sodium chloride 75 mL/hr at 08/05/13 0806  . heparin Stopped (08/05/13 1627)    Time spent: > 35 minutes    Kahne Helfand, [facility]  [facility]  Pager [phone]. If 7PM-7AM, please contact night-coverage at [email or web address], password [facility] 08/05/2013, 5:28 PM  LOS: 1 day

## 2013-08-05 NOTE — Progress Notes (Signed)
ANTICOAGULATION CONSULT NOTE - Follow Up Consult  Pharmacy Consult for heparin Indication: NSTEMI  Labs:  Recent Labs  08/04/13 1608 08/04/13 1940 08/05/13 0030 08/05/13 0630  HGB 15.3 14.8 14.3  --   HCT 43.4 42.9 42.5  --   PLT 213 206 192  --   HEPARINUNFRC  --   --  0.44 0.43  CREATININE 1.11 1.07  --   --   TROPONINI  --  3.54* 11.14*  --     Assessment/Plan:  68yo male therapeutic on heparin with initial dosing for NSTEMI. Will continue gtt at current rate and confirm stable with additional level.   Wynona Neat, PharmD, BCPS  08/05/2013,7:00 AM

## 2013-08-05 NOTE — Progress Notes (Signed)
ANTICOAGULATION CONSULT NOTE - Follow-up  Pharmacy Consult for Heparin Indication: chest pain/ACS  Allergies  Allergen Reactions  . Percocet [Oxycodone-Acetaminophen]     Nausea     Patient Measurements: Height: 5\' 9"  (175.3 cm) Weight: 197 lb 4.8 oz (89.495 kg) IBW/kg (Calculated) : 70.7 Heparin Dosing Weight: 89 kg  Vital Signs: Temp: 97.9 F (36.6 C) (07/17 1133) Temp src: Oral (07/17 1133) BP: 121/76 mmHg (07/17 1133) Pulse Rate: 55 (07/17 1133)  Labs:  Recent Labs  08/04/13 1608  08/04/13 1940 08/05/13 0030 08/05/13 0630 08/05/13 0727 08/05/13 1302  HGB 15.3  --  14.8 14.3  --   --   --   HCT 43.4  --  42.9 42.5  --   --   --   PLT 213  --  206 192  --   --   --   LABPROT  --   --   --   --   --  14.8  --   INR  --   --   --   --   --  1.16  --   HEPARINUNFRC  --   --   --  0.44 0.43  --  0.44  CREATININE 1.11  --  1.07  --   --  1.15  --   TROPONINI  --   < > 3.54* 11.14*  --  10.50* 4.92*  < > = values in this interval not displayed.  Estimated Creatinine Clearance: 70.8 ml/min (by C-G formula based on Cr of 1.15).  Medications:  Heparin gtt 1200 units/hr  Assessment: 67 YOM continues on IV heparin for CP. Repeat heparin level is therapeutic at 0.44. No bleeding noted. Planning cath this afternoon.   Goal of Therapy:  Heparin level 0.3-0.7 units/ml Monitor platelets by anticoagulation protocol: Yes   Plan:  1. Continue heparin gtt 1200 units/hr 2. F/u plans post-cath  Salome Arnt, PharmD, BCPS Pager # [phone] 08/05/2013 1:56 PM

## 2013-08-05 NOTE — Progress Notes (Signed)
UR Completed Yobana Culliton Graves-Bigelow, RN,BSN [phone]  

## 2013-08-05 NOTE — ED Provider Notes (Signed)
68 y.o. Male wit chest pain concerning for acs.  Patient pain free here. EKG with nsst changes and first troponin negative  I performed a history and physical examination of RASHEEM FIGIEL and discussed his management with Dr. Silvio Clayman.  I agree with the history, physical, assessment, and plan of care, with the following exceptions: None  I was present for the following procedures: None Time Spent in Critical Care of the patient: None Time spent in discussions with the patient and family: [place], MD 08/05/13 1649

## 2013-08-05 NOTE — CV Procedure (Signed)
Cardiac Catheterization Procedure Note  Name: Ronald Berger MRN: [id number] DOB: 05-20-1945  Procedure: Left Heart Cath, Selective Coronary Angiography, LV angiography, PTCA and stenting of the LCx, first OM, and mid-LAD  Indication: NSTEMI  Procedural Details:  The right wrist was prepped, draped, and anesthetized with 1% lidocaine. Using the modified Seldinger technique, a 5 French sheath was introduced into the right radial artery. 3 mg of verapamil was administered through the sheath, weight-based unfractionated heparin was administered intravenously. Standard Judkins catheters were used for selective coronary angiography and left ventriculography. Catheter exchanges were performed over an exchange length guidewire.  PROCEDURAL FINDINGS Hemodynamics: AO 124/69 LV 124/20   Coronary angiography: Coronary dominance: right  Left mainstem: The left main is patent with mild 20-30% distal stenosis. It divides into the LAD and left circumflex.  Left anterior descending (LAD): The LAD is a large caliber vessel. There is heavy calcification vessel with associated 75% stenosis. Beyond the area of stenosis the LAD is mildly dilated. There is no further significant disease portion of the LAD there is another 75% stenosis. I  Left circumflex (LCx): The left circumflex is of large caliber. The mid circumflex has a 95% eccentric plaque leading into a large posterolateral branch. The proximal circumflex has 20-30% stenosis. The first OM branch is medium in caliber and it has a 90% stenosis in its midportion. The ostium of that OM has nonobstructive disease with 30% stenosis.  Right coronary artery (RCA): The vessel is diffusely diseased. The proximal vessel is mildly ectatic without significant stenosis. The mid vessel has 30% stenosis. The distal vessel is irregular without high-grade stenosis. The PDA branch is small with no significant disease. PLA branch is medium in caliber with mild  diffuse stenosis. The acute marginal branch is patent without significant stenosis.  Left ventriculography: Left ventricular systolic function is normal, LVEF is estimated at 55-65%, there is no significant mitral regurgitation   PCI Note:  Following the diagnostic procedure, the decision was made to proceed with PCI. The patient had focal disease involving the mid circumflex which I thought was his culprit vessel. He also had severe stenosis of the first OM branch and the mid LAD. Attention was first turned to the mid circumflex lesion as it was the most critical blockage. The patient was loaded with effient 60 mg. Angiomax was used for anticoagulation.  Once a therapeutic ACT was achieved, a 6 [place] XB LAD 3.5 cm guide catheter was inserted.  A cougar coronary guidewire was used to cross the lesion.  The lesion was predilated with a 2.5 mm balloon.  The lesion was then stented with a 3.0 by 63mm Promus premier drug-eluting stent.  The stent was postdilated with a 3.25 mm noncompliant balloon to 16 atmospheres.  Following PCI, there was 0% residual stenosis and TIMI-3 flow. The cougar wire was then redirected beyond the severe stenosis in the first obtuse marginal branch. The lesion was predilated with the same 2.5 x 12 mm balloon to 6 atmospheres. It was then stented with a 2.5 by 44mm Promus premier DES to 12 atmospheres. The stent was well expanded and appear to have a step up and step down off the proximal and distal edges, respectively. Post dilatation was not performed. Attention was then turned to the mid LAD. The same guidewire was advanced across the lesion in the mid LAD. The vessel was predilated with a 2.5 x 12 mm balloon. It was then stented with a 3.5 x 20 mm Promus premier  DES which was deployed at 16 atmospheres. The stent was postdilated with a 3.75 mm noncompliant balloon to 18 atmospheres. There was 0% residual stenosis and TIMI-3 flow. Final angiography confirmed an excellent result. The  patient tolerated the procedure well. There were no immediate procedural complications. A TR band was used for radial hemostasis. The patient was transferred to the post catheterization recovery area for further monitoring.  PCI Data: Lesion 1 Vessel - LCx/Segment - mid Percent Stenosis (pre)  95 TIMI-flow 3 Stent 3.0x16 Promus DES Percent Stenosis (post) 0 TIMI-flow (post) 3  Lesion 2 Vessel - OM1/Segment - proximal Percent Stenosis (pre)  90 TIMI-flow 3 Stent 2.5x16 mm Promus DES Percent Stenosis (post) 0 TIMI-flow (post) 3  Lesion 3 Vessel - LAD/Segment - mid Percent Stenosis (pre)  75 TIMI-flow 3 Stent 3.5x20 mm Promus DES Percent Stenosis (post) 3 TIMI-flow (post) 0  Final Conclusions:   1. Severe 2 vessel coronary artery disease with severe stenosis of the mid left circumflex, first obtuse marginal, and mid LAD 2. Nonobstructive RCA stenosis 3. Preserved LV systolic function 4. Successful PCI of the LAD, left circumflex, and OM branches as outlined above  Recommendations:  Aspirin and effient for a minimum of 12 months.  [name] 08/05/2013, 6:37 PM

## 2013-08-05 NOTE — Interval H&P Note (Signed)
History and Physical Interval Note:  08/05/2013 5:20 PM  Ronald Berger  has presented today for surgery, with the diagnosis of nstemi  The various methods of treatment have been discussed with the patient and family. After consideration of risks, benefits and other options for treatment, the patient has consented to  Procedure(s): LEFT HEART CATHETERIZATION WITH CORONARY ANGIOGRAM (N/A) as a surgical intervention .  The patient's history has been reviewed, patient examined, no change in status, stable for surgery.  I have reviewed the patient's chart and labs.  Questions were answered to the patient's satisfaction.    Cath Lab Visit (complete for each Cath Lab visit)  Clinical Evaluation Leading to the Procedure:   ACS: Yes.    Non-ACS:    Anginal Classification: CCS IV  Anti-ischemic medical therapy: Minimal Therapy (1 class of medications)  Non-Invasive Test Results: No non-invasive testing performed  Prior CABG: No previous CABG       [name]

## 2013-08-05 NOTE — Progress Notes (Signed)
Patient Name: Ronald Berger Date of Encounter: 08/05/2013     Principal Problem:   Chest pain    SUBJECTIVE  No chest pain since admission. He has ruled in for a NSTEMI. EKG is normal this am. CURRENT MEDS . [START ON 08/06/2013] aspirin  81 mg Oral Pre-Cath  . aspirin EC  325 mg Oral Daily  . atorvastatin  80 mg Oral q1800  . metoprolol tartrate  12.5 mg Oral BID  . sodium chloride  3 mL Intravenous Q12H    OBJECTIVE  Filed Vitals:   08/04/13 2100 08/04/13 2246 08/05/13 0010 08/05/13 0500  BP: 168/82 144/66 127/64 125/72  Pulse: 59 59 62 65  Temp: 97.7 F (36.5 C)  98 F (36.7 C) 98.3 F (36.8 C)  TempSrc: Oral  Oral Oral  Resp:   18 18  Height:      Weight:      SpO2: 99%  99% 97%   No intake or output data in the 24 hours ending 08/05/13 0750 Filed Weights   08/04/13 2036  Weight: 197 lb 4.8 oz (89.495 kg)    PHYSICAL EXAM  General: Pleasant, NAD. Neuro: Alert and oriented X 3. Moves all extremities spontaneously. Psych: Normal affect. HEENT:  Normal  Neck: Supple without bruits or JVD. Lungs:  Resp regular and unlabored, CTA. Heart: RRR no s3, s4, or murmurs. Abdomen: Soft, non-tender, non-distended, BS + x 4.  Extremities: No clubbing, cyanosis or edema. DP/PT/Radials 2+ and equal bilaterally.  Accessory Clinical Findings  CBC  Recent Labs  08/04/13 1940 08/05/13 0030  WBC 11.6* 9.7  HGB 14.8 14.3  HCT 42.9 42.5  MCV 83.0 84.8  PLT 206 376   Basic Metabolic Panel  Recent Labs  08/04/13 1608 08/04/13 1940  NA 143  --   K 3.5*  --   CL 103  --   CO2 24  --   GLUCOSE 105*  --   BUN 14  --   CREATININE 1.11 1.07  CALCIUM 9.5  --   MG  --  1.9   Liver Function Tests No results found for this basename: AST, ALT, ALKPHOS, BILITOT, PROT, ALBUMIN,  in the last 72 hours No results found for this basename: LIPASE, AMYLASE,  in the last 72 hours Cardiac Enzymes  Recent Labs  08/04/13 1940 08/05/13 0030  TROPONINI 3.54*  11.14*   BNP No components found with this basename: POCBNP,  D-Dimer No results found for this basename: DDIMER,  in the last 72 hours Hemoglobin A1C  Recent Labs  08/04/13 1940  HGBA1C 5.7*   Fasting Lipid Panel  Recent Labs  08/04/13 1940  CHOL 233*  HDL 38*  LDLCALC 154*  TRIG 204*  CHOLHDL 6.1   Thyroid Function Tests  Recent Labs  08/04/13 1940  TSH 3.330    TELE  NSR  ECG  Normal this am  Radiology/Studies  Dg Chest 2 View  08/04/2013   CLINICAL DATA:  Chest pain, shortness of breath  EXAM: CHEST  2 VIEW  COMPARISON:  None.  FINDINGS: Normal cardiac silhouette and mediastinal contours. There is mild elevation of the right hemidiaphragm. No focal parenchymal opacities. No pleural effusion or pneumothorax. No evidence of edema. No acute osseus abnormalities.  IMPRESSION: No acute cardiopulmonary disease.   Electronically Signed   By: Sandi Mariscal M.D.   On: 08/04/2013 18:10    ASSESSMENT AND PLAN  1. NSTEMI 2. Hypercholesterolemia. 3. Hypokalemia  Plan: Cardiac cath/PCI today. The risks  and benefits of a cardiac catheterization including, but not limited to, death, stroke, MI, kidney damage and bleeding were discussed with the patient who indicates understanding and agrees to proceed.  Recheck BMET this am. He did receive KCL last pm.  Signed, Darlin Coco MD

## 2013-08-05 NOTE — Care Management Note (Unsigned)
    Page 1 of 1   08/05/2013     9:55:46 AM CARE MANAGEMENT NOTE 08/05/2013  Patient:  Ronald Berger, Ronald Berger   Account Number:  [id number]  Date Initiated:  08/05/2013  Documentation initiated by:  GRAVES-BIGELOW,Deonna Krummel  Subjective/Objective Assessment:   Pt admitted for cp-NSTEMI. Plan for cath today.     Action/Plan:   CM will continue to monitor for disposition needs.   Anticipated DC Date:  08/07/2013   Anticipated DC Plan:  [place]  CM consult      Choice offered to / List presented to:             Status of service:  In process, will continue to follow Medicare Important Message given?   (If response is "NO", the following Medicare IM given date fields will be blank) Date Medicare IM given:   Medicare IM given by:   Date Additional Medicare IM given:   Additional Medicare IM given by:    Discharge Disposition:    Per UR Regulation:  Reviewed for med. necessity/level of care/duration of stay  If discussed at [place] of Stay Meetings, dates discussed:    Comments:

## 2013-08-05 NOTE — H&P (View-Only) (Signed)
Patient Name: Ronald Berger Date of Encounter: 08/05/2013     Principal Problem:   Chest pain    SUBJECTIVE  No chest pain since admission. He has ruled in for a NSTEMI. EKG is normal this am. CURRENT MEDS . [START ON 08/06/2013] aspirin  81 mg Oral Pre-Cath  . aspirin EC  325 mg Oral Daily  . atorvastatin  80 mg Oral q1800  . metoprolol tartrate  12.5 mg Oral BID  . sodium chloride  3 mL Intravenous Q12H    OBJECTIVE  Filed Vitals:   08/04/13 2100 08/04/13 2246 08/05/13 0010 08/05/13 0500  BP: 168/82 144/66 127/64 125/72  Pulse: 59 59 62 65  Temp: 97.7 F (36.5 C)  98 F (36.7 C) 98.3 F (36.8 C)  TempSrc: Oral  Oral Oral  Resp:   18 18  Height:      Weight:      SpO2: 99%  99% 97%   No intake or output data in the 24 hours ending 08/05/13 0750 Filed Weights   08/04/13 2036  Weight: 197 lb 4.8 oz (89.495 kg)    PHYSICAL EXAM  General: Pleasant, NAD. Neuro: Alert and oriented X 3. Moves all extremities spontaneously. Psych: Normal affect. HEENT:  Normal  Neck: Supple without bruits or JVD. Lungs:  Resp regular and unlabored, CTA. Heart: RRR no s3, s4, or murmurs. Abdomen: Soft, non-tender, non-distended, BS + x 4.  Extremities: No clubbing, cyanosis or edema. DP/PT/Radials 2+ and equal bilaterally.  Accessory Clinical Findings  CBC  Recent Labs  08/04/13 1940 08/05/13 0030  WBC 11.6* 9.7  HGB 14.8 14.3  HCT 42.9 42.5  MCV 83.0 84.8  PLT 206 782   Basic Metabolic Panel  Recent Labs  08/04/13 1608 08/04/13 1940  NA 143  --   K 3.5*  --   CL 103  --   CO2 24  --   GLUCOSE 105*  --   BUN 14  --   CREATININE 1.11 1.07  CALCIUM 9.5  --   MG  --  1.9   Liver Function Tests No results found for this basename: AST, ALT, ALKPHOS, BILITOT, PROT, ALBUMIN,  in the last 72 hours No results found for this basename: LIPASE, AMYLASE,  in the last 72 hours Cardiac Enzymes  Recent Labs  08/04/13 1940 08/05/13 0030  TROPONINI 3.54*  11.14*   BNP No components found with this basename: POCBNP,  D-Dimer No results found for this basename: DDIMER,  in the last 72 hours Hemoglobin A1C  Recent Labs  08/04/13 1940  HGBA1C 5.7*   Fasting Lipid Panel  Recent Labs  08/04/13 1940  CHOL 233*  HDL 38*  LDLCALC 154*  TRIG 204*  CHOLHDL 6.1   Thyroid Function Tests  Recent Labs  08/04/13 1940  TSH 3.330    TELE  NSR  ECG  Normal this am  Radiology/Studies  Dg Chest 2 View  08/04/2013   CLINICAL DATA:  Chest pain, shortness of breath  EXAM: CHEST  2 VIEW  COMPARISON:  None.  FINDINGS: Normal cardiac silhouette and mediastinal contours. There is mild elevation of the right hemidiaphragm. No focal parenchymal opacities. No pleural effusion or pneumothorax. No evidence of edema. No acute osseus abnormalities.  IMPRESSION: No acute cardiopulmonary disease.   Electronically Signed   By: Sandi Mariscal M.D.   On: 08/04/2013 18:10    ASSESSMENT AND PLAN  1. NSTEMI 2. Hypercholesterolemia. 3. Hypokalemia  Plan: Cardiac cath/PCI today. The risks  and benefits of a cardiac catheterization including, but not limited to, death, stroke, MI, kidney damage and bleeding were discussed with the patient who indicates understanding and agrees to proceed.  Recheck BMET this am. He did receive KCL last pm.  Signed, Darlin Coco MD

## 2013-08-06 DIAGNOSIS — I200 Unstable angina: Secondary | ICD-10-CM

## 2013-08-06 DIAGNOSIS — I2510 Atherosclerotic heart disease of native coronary artery without angina pectoris: Secondary | ICD-10-CM

## 2013-08-06 DIAGNOSIS — Z9861 Coronary angioplasty status: Secondary | ICD-10-CM

## 2013-08-06 DIAGNOSIS — I214 Non-ST elevation (NSTEMI) myocardial infarction: Secondary | ICD-10-CM

## 2013-08-06 DIAGNOSIS — J45909 Unspecified asthma, uncomplicated: Secondary | ICD-10-CM

## 2013-08-06 LAB — BASIC METABOLIC PANEL
ANION GAP: 13 (ref 5–15)
BUN: 12 mg/dL (ref 6–23)
CALCIUM: 8.3 mg/dL — AB (ref 8.4–10.5)
CO2: 22 meq/L (ref 19–32)
CREATININE: 0.98 mg/dL (ref 0.50–1.35)
Chloride: 105 mEq/L (ref 96–112)
GFR calc Af Amer: >90 mL/min (ref >90)
GFR, EST NON AFRICAN AMERICAN: 84 mL/min — AB (ref >90)
Glucose, Bld: 103 mg/dL — ABNORMAL HIGH (ref 70–99)
Potassium: 3.8 mEq/L (ref 3.7–5.3)
SODIUM: 140 meq/L (ref 137–147)

## 2013-08-06 LAB — CBC
HEMATOCRIT: 42 % (ref 39.0–52.0)
Hemoglobin: 14.2 g/dL (ref 13.0–17.0)
MCH: 28.7 pg (ref 26.0–34.0)
MCHC: 33.8 g/dL (ref 30.0–36.0)
MCV: 84.8 fL (ref 78.0–100.0)
PLATELETS: 189 10*3/uL (ref 150–400)
RBC: 4.95 MIL/uL (ref 4.22–5.81)
RDW: 13.5 % (ref 11.5–15.5)
WBC: 10.3 10*3/uL (ref 4.0–10.5)

## 2013-08-06 MED ORDER — NITROGLYCERIN 0.4 MG SL SUBL: 0.4000 mg | SUBLINGUAL_TABLET | SUBLINGUAL | Status: DC | PRN

## 2013-08-06 MED ORDER — METOPROLOL TARTRATE 25 MG PO TABS: 12.5000 mg | ORAL_TABLET | Freq: Two times a day (BID) | ORAL | Status: DC

## 2013-08-06 MED ORDER — PRASUGREL HCL 10 MG PO TABS: 10.0000 mg | ORAL_TABLET | Freq: Every day | ORAL | Status: DC

## 2013-08-06 MED ORDER — ATORVASTATIN CALCIUM 80 MG PO TABS: 80.0000 mg | ORAL_TABLET | Freq: Every day | ORAL | Status: DC

## 2013-08-06 MED ORDER — ASPIRIN 81 MG PO CHEW: 81.0000 mg | CHEWABLE_TABLET | Freq: Every day | ORAL | Status: AC

## 2013-08-06 NOTE — Progress Notes (Signed)
Patient Name: Ronald Berger Date of Encounter: 08/06/2013     Principal Problem:   Chest pain Active Problems:   ASTHMA   Esophageal reflux   NSTEMI (non-ST elevated myocardial infarction)    SUBJECTIVE  NO CP. Ready to go home.  CURRENT MEDS . aspirin  81 mg Oral Daily  . atorvastatin  80 mg Oral q1800  . metoprolol tartrate  12.5 mg Oral BID  . pneumococcal 23 valent vaccine  0.5 mL Intramuscular Tomorrow-1000  . prasugrel  10 mg Oral Daily  . zolpidem  5 mg Oral QHS    OBJECTIVE  Filed Vitals:   08/06/13 0012 08/06/13 0100 08/06/13 0436 08/06/13 0747  BP: 138/80 143/69 114/73 136/72  Pulse: 66 58 64 61  Temp: 97.9 F (36.6 C)  98 F (36.7 C) 98.5 F (36.9 C)  TempSrc: Oral  Oral Oral  Resp: 18  17 18   Height:      Weight: 200 lb 2.8 oz (90.8 kg)     SpO2: 98% 96% 97% 100%    Intake/Output Summary (Last 24 hours) at 08/06/13 0812 Last data filed at 08/06/13 0748  Gross per 24 hour  Intake 1857.5 ml  Output   1400 ml  Net  457.5 ml   Filed Weights   08/04/13 2036 08/06/13 0012  Weight: 197 lb 4.8 oz (89.495 kg) 200 lb 2.8 oz (90.8 kg)    PHYSICAL EXAM  General: Pleasant, NAD. Neuro: Alert and oriented X 3. Moves all extremities spontaneously. Psych: Normal affect. HEENT:  Normal  Neck: Supple without bruits or JVD. Lungs:  Resp regular and unlabored, CTA. Heart: RRR no s3, s4, or murmurs. Abdomen: Soft, non-tender, non-distended, BS + x 4.  Extremities: No clubbing, cyanosis or edema. DP/PT/Radials 2+ and equal bilaterally. Right radial cath site looks great.   Accessory Clinical Findings  CBC  Recent Labs  08/05/13 0030 08/06/13 0345  WBC 9.7 10.3  HGB 14.3 14.2  HCT 42.5 42.0  MCV 84.8 84.8  PLT 192 035   Basic Metabolic Panel  Recent Labs  08/04/13 1608 08/04/13 1940 08/05/13 0727 08/06/13 0345  NA 143  --  142 140  K 3.5*  --  3.9 3.8  CL 103  --  106 105  CO2 24  --  24 22  GLUCOSE 105*  --  104* 103*  BUN 14   --  15 12  CREATININE 1.11 1.07 1.15 0.98  CALCIUM 9.5  --  8.8 8.3*  MG  --  1.9  --   --    L Cardiac Enzymes  Recent Labs  08/05/13 0030 08/05/13 0727 08/05/13 1302  TROPONINI 11.14* 10.50* 4.92*   Hemoglobin A1C  Recent Labs  08/04/13 1940  HGBA1C 5.7*   Fasting Lipid Panel  Recent Labs  08/04/13 1940  CHOL 233*  HDL 38*  LDLCALC 154*  TRIG 204*  CHOLHDL 6.1   Thyroid Function Tests  Recent Labs  08/04/13 1940  TSH 3.330    TELE  NSR, periods of sinus brady. Freq PVCs  Radiology/Studies  Dg Chest 2 View  08/04/2013   CLINICAL DATA:  Chest pain, shortness of breath  EXAM: CHEST  2 VIEW  COMPARISON:  None.  FINDINGS: Normal cardiac silhouette and mediastinal contours. There is mild elevation of the right hemidiaphragm. No focal parenchymal opacities. No pleural effusion or pneumothorax. No evidence of edema. No acute osseus abnormalities.  IMPRESSION: No acute cardiopulmonary disease.  ASSESSMENT AND PLAN Ronald Berger  is a 68 y.o. male with a history of HLD who presented to [facility]13 with chest pain and found to have NSTEMI.  NSTEMI- s/p LHC w/ 1. Severe 2 vessel coronary artery disease with severe stenosis of the mid left circumflex, first obtuse marginal, and mid LAD  2. Nonobstructive RCA stenosis  3. Preserved LV systolic function  4. Successful PCI of the LAD, left circumflex, and OM branches as outlined above  Hypercholesterolemia- continue statin  Hypokalemia - resolved.    Likely okay for discharge home. Radial site stable.  MD to see. I have left a VM with the office to call to make a TOC appointment with the patient. Dr Debara Pickett will be his new cardiologist.    Tyrell Antonio PA-C  Pager [phone]  Attending Note:   The patient was seen and examined.  Agree with assessment and plan as noted above.  Changes made to the above note as needed.  Ronald Berger has done well. No angina. Has ambulated with rehab without  complications. DC to home today ASA and Effient for > 1 year Aspirin 81 mg a day Atorvastatin 80 mg a day Nitroglycerin 0.4 mg sublingually as needed Effient  10 mg a day  Follow up with Dr. Debara Pickett in the office in several weeks .  Nell Range has left message for office to call him.    Thayer Headings, Brooke Bonito., MD, [facility] [address] 08/06/2013, 8:59 AM

## 2013-08-06 NOTE — Progress Notes (Signed)
Still deflating TR band slowly due to oozing, level 1 for bruise and firmness proximal to band. Pt pleasant and cooperative.  BP 138/80.  Tele SB 50's.  MI teaching begun including medications, s/s, when to call MD, SL NTG, activation of EMS.  Pt denies CP or SOB.

## 2013-08-06 NOTE — Progress Notes (Signed)
CARDIAC REHAB PHASE I   PRE:  Rate/Rhythm: 82 SR walking    BP: sitting     SaO2:   MODE:  Ambulation: 450 ft   POST:  Rate/Rhythm: 87 SR    BP: sitting 143/75     SaO2:   Up walking independently. Feels well. Ed completed with good reception. Appropriate questions. Interested in [facility] and will send referral to [place]. [place], [place], ACSM 08/06/2013 9:27 AM

## 2013-08-06 NOTE — Discharge Instructions (Signed)

## 2013-08-06 NOTE — Discharge Summary (Signed)
Physician Discharge Summary  Ronald Berger:[id number] DOB: 1945-12-22 DOA: 08/04/2013  PCP: Deloria Lair, MD  Admit date: 08/04/2013 Discharge date: 08/06/2013  Time spent: greater than 30 minutes  Recommendations for Outpatient Follow-up:  1.   Discharge Diagnoses:  Principal Problem:   Chest pain Active Problems:   ASTHMA   Esophageal reflux   NSTEMI (non-ST elevated myocardial infarction)   Discharge Condition: stable  Filed Weights   08/04/13 2036 08/06/13 0012  Weight: 89.495 kg (197 lb 4.8 oz) 90.8 kg (200 lb 2.8 oz)    History of present illness:  68 y.o. year old male with significant past medical history of HLD presenting with chest pain. 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 Stopped working and drank some water. Pt states that sxs seemed to worsen over this time frame with progressive onset of nausea, dyspnea and diaphoresis. Contacted his wife and son about this time. Son arrived and gave him ASA with some improvement in sxs. Was brought to the ER for further evaluation. Pt denies any prior hx/o sxs like this in the past. CP is predominantly central without radiation and was initally fairly constant.  On presentation, hemodynamically stable. Blood work essentially WNL. Trop neg x1. Initial EKG with faint t wave abnormalities in lateral leads. Was given sub lingual NTG x2 with near resolution of chest pressure.  Heart score: 6.  subtle less than 1 mm ST depression anterolaterally on his EKG CV RFs: age, hx/o HLD, + family history   Hospital Course:  Admitted to tele. Cardiology consulted. Started on aspirin, statin, heparin gtt, beta blocker. Ruled in for MI.  Had cardiac cath and stenting. See below. No futher CP and cardiology has cleared pt for discharge  Procedures: Cardiac catheterization and PCI of LAD, left circomflex and OM branches Final Conclusions:  1. Severe 2 vessel  coronary artery disease with severe stenosis of the mid left circumflex, first obtuse marginal, and mid LAD  2. Nonobstructive RCA stenosis  3. Preserved LV systolic function  4. Successful PCI of the LAD, left circumflex, and OM branches as outlined above  Recommendations:  Aspirin and effient for a minimum of 12 months.  Consultations:  [facility]  Discharge Exam: Filed Vitals:   08/06/13 0747  BP: 136/72  Pulse: 61  Temp: 98.5 F (36.9 C)  Resp: 18    General: comfortable Cardiovascular: RRR Respiratory: CTA  Discharge Instructions   Activity as tolerated - No restrictions    Complete by:  As directed      Amb Referral to Cardiac Rehabilitation    Complete by:  As directed      Diet - low sodium heart healthy    Complete by:  As directed             Medication List         aspirin 81 MG chewable tablet  Chew 1 tablet (81 mg total) by mouth daily.     atorvastatin 80 MG tablet  Commonly known as:  LIPITOR  Take 1 tablet (80 mg total) by mouth daily at 6 PM.     metoprolol tartrate 25 MG tablet  Commonly known as:  LOPRESSOR  Take 0.5 tablets (12.5 mg total) by mouth 2 (two) times daily.     nitroGLYCERIN 0.4 MG SL tablet  Commonly known as:  NITROSTAT  Place 1 tablet (0.4 mg total) under the tongue every 5 (five) minutes as needed for  chest pain.     prasugrel 10 MG Tabs tablet  Commonly known as:  EFFIENT  Take 1 tablet (10 mg total) by mouth daily.     zolpidem 10 MG tablet  Commonly known as:  AMBIEN  Take 10 mg by mouth at bedtime.       Allergies  Allergen Reactions  . Percocet [Oxycodone-Acetaminophen]     Nausea        Follow-up Information   Follow up with Pixie Casino, MD. (The office will call to make an appointment with a PA or NP in the next 1-2 weeks. IF you do not hear from them by monday please call. )    Specialty:  Cardiology   Contact information:   [place] [address] [phone]        Follow up with TAPPER,DAVID B, MD. (As needed)    Specialty:  Family Medicine   Contact information:   [address] [place] [address] [phone]        The results of significant diagnostics from this hospitalization (including imaging, microbiology, ancillary and laboratory) are listed below for reference.    Significant Diagnostic Studies: Dg Chest 2 View  08/04/2013   CLINICAL DATA:  Chest pain, shortness of breath  EXAM: CHEST  2 VIEW  COMPARISON:  None.  FINDINGS: Normal cardiac silhouette and mediastinal contours. There is mild elevation of the right hemidiaphragm. No focal parenchymal opacities. No pleural effusion or pneumothorax. No evidence of edema. No acute osseus abnormalities.  IMPRESSION: No acute cardiopulmonary disease.   Electronically Signed   By: Sandi Mariscal M.D.   On: 08/04/2013 18:10   EKG NSR. Subtle anterolateral ST depression   Microbiology: No results found for this or any previous visit (from the past 240 hour(s)).   Labs: Basic Metabolic Panel:  Recent Labs Lab 08/04/13 1608 08/04/13 1940 08/05/13 0727 08/06/13 0345  NA 143  --  142 140  K 3.5*  --  3.9 3.8  CL 103  --  106 105  CO2 24  --  24 22  GLUCOSE 105*  --  104* 103*  BUN 14  --  15 12  CREATININE 1.11 1.07 1.15 0.98  CALCIUM 9.5  --  8.8 8.3*  MG  --  1.9  --   --    Liver Function Tests: No results found for this basename: AST, ALT, ALKPHOS, BILITOT, PROT, ALBUMIN,  in the last 168 hours No results found for this basename: LIPASE, AMYLASE,  in the last 168 hours No results found for this basename: AMMONIA,  in the last 168 hours CBC:  Recent Labs Lab 08/04/13 1608 08/04/13 1940 08/05/13 0030 08/06/13 0345  WBC 9.7 11.6* 9.7 10.3  HGB 15.3 14.8 14.3 14.2  HCT 43.4 42.9 42.5 42.0  MCV 82.7 83.0 84.8 84.8  PLT 213 206 192 189   Cardiac Enzymes:  Recent Labs Lab 08/04/13 1940 08/05/13 0030 08/05/13 0727 08/05/13 1302  TROPONINI 3.54* 11.14* 10.50* 4.92*    BNP: BNP (last 3 results)  Recent Labs  08/04/13 1608  PROBNP 83.4   CBG: No results found for this basename: GLUCAP,  in the last 168 hours     Signed:  Hayfield L  [facility] 08/06/2013, 9:56 AM

## 2013-08-08 MED FILL — Sodium Chloride IV Soln 0.9%: INTRAVENOUS | Qty: 50 | Status: AC

## 2013-08-16 ENCOUNTER — Telehealth: Payer: Self-pay | Admitting: Internal Medicine

## 2013-08-16 NOTE — Telephone Encounter (Signed)
Pt. Called no answer message left on machine to call me back

## 2013-08-16 NOTE — Telephone Encounter (Signed)
Pt have been having some pressure in his chest. He does not know if he should wait until his appt on 08-23-13.

## 2013-08-18 NOTE — Telephone Encounter (Signed)
Attempted to reach patient. Left message on machine call was returned . Call back if assistance still needed.

## 2013-08-18 NOTE — Telephone Encounter (Signed)
Left another message to call back if assistance still needed.

## 2013-08-23 ENCOUNTER — Encounter: Payer: Self-pay | Admitting: Cardiology

## 2013-08-23 ENCOUNTER — Ambulatory Visit (INDEPENDENT_AMBULATORY_CARE_PROVIDER_SITE_OTHER): Payer: Medicare HMO | Admitting: Cardiology

## 2013-08-23 VITALS — BP 128/80 | HR 55 | Ht 69.0 in | Wt 195.7 lb

## 2013-08-23 DIAGNOSIS — E785 Hyperlipidemia, unspecified: Secondary | ICD-10-CM

## 2013-08-23 DIAGNOSIS — I2510 Atherosclerotic heart disease of native coronary artery without angina pectoris: Secondary | ICD-10-CM

## 2013-08-23 DIAGNOSIS — I214 Non-ST elevation (NSTEMI) myocardial infarction: Secondary | ICD-10-CM

## 2013-08-23 MED ORDER — ATORVASTATIN CALCIUM 80 MG PO TABS: 80.0000 mg | ORAL_TABLET | Freq: Every day | ORAL | Status: DC

## 2013-08-23 MED ORDER — PRASUGREL HCL 10 MG PO TABS: 10.0000 mg | ORAL_TABLET | Freq: Every day | ORAL | Status: DC

## 2013-08-23 MED ORDER — OMEPRAZOLE 20 MG PO CPDR: 20.0000 mg | DELAYED_RELEASE_CAPSULE | Freq: Every day | ORAL | Status: DC

## 2013-08-23 MED ORDER — METOPROLOL TARTRATE 25 MG PO TABS: 12.5000 mg | ORAL_TABLET | Freq: Two times a day (BID) | ORAL | Status: DC

## 2013-08-23 NOTE — Assessment & Plan Note (Signed)
Now on lipitor 80 mg.  Will check lipid panel in 5 weeks.

## 2013-08-23 NOTE — Patient Instructions (Signed)
Your physician recommends that you schedule a follow-up appointment in: [place] physician recommends that you return for lab work in: 5 WEEKS=DO NOT EAT PRIOR TO LAB WORK

## 2013-08-23 NOTE — Assessment & Plan Note (Signed)
Stable without recurrent angina.

## 2013-08-23 NOTE — Progress Notes (Signed)
08/23/2013   PCP: Deloria Lair, MD   Chief Complaint  Patient presents with  . Follow-up    post stent    Primary Cardiologist:Dr. Alma Friendly   HPI:  68 year old male admitted with NSTEMI, undergoing PCI with promus DES to LCX, OM1, LAD, peak troponin was 11.  EF 55-65%.  His LDL was 154.  He is back today for follow up.  He had some chest discomfort after he got home, he called the office but never heard back.  He took a rolaids and it resolved.  He is on prilosec.  No further chest pain, he is walking an hour in the morning and some in the evening.  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  He is eating healthy.  No complaints today.  We discussed it would be beneficial to go to cardiac rehab.  He is planning to go to [facility] in Sept.   Allergies  Allergen Reactions  . Percocet [Oxycodone-Acetaminophen]     Nausea     Current Outpatient Prescriptions  Medication Sig Dispense Refill  . aspirin 81 MG chewable tablet Chew 1 tablet (81 mg total) by mouth daily.      Marland Kitchen atorvastatin (LIPITOR) 80 MG tablet Take 1 tablet (80 mg total) by mouth daily at 6 PM.  90 tablet  3  . metoprolol tartrate (LOPRESSOR) 25 MG tablet Take 0.5 tablets (12.5 mg total) by mouth 2 (two) times daily.  90 tablet  3  . nitroGLYCERIN (NITROSTAT) 0.4 MG SL tablet Place 1 tablet (0.4 mg total) under the tongue every 5 (five) minutes as needed for chest pain.  20 tablet  1  . omeprazole (PRILOSEC) 20 MG capsule Take 1 capsule (20 mg total) by mouth daily.  90 capsule  3  . prasugrel (EFFIENT) 10 MG TABS tablet Take 1 tablet (10 mg total) by mouth daily.  90 tablet  4  . zolpidem (AMBIEN) 10 MG tablet Take 10 mg by mouth at bedtime.       No current facility-administered medications for this visit.    Past Medical History  Diagnosis Date  . Myocardial infarction 08/02/13  . Environmental allergies   . Kidney stone   . Transfusion history     autologous  transfusion during back surgery  . NSTEMI, initial episode of care 07/2013  . 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 Hyperlipidemia LDL goal <70     Past Surgical History  Procedure Laterality Date  . Back surgery      x3  . Kidney stone surgery    . Coronary angioplasty with stent placement  08/06/13    Promus DES to LCX, OM1, & LAD     QIH:KVQQVZD:GL colds or fevers, no weight changes Skin:no rashes or ulcers HEENT:no blurred vision, no congestion CV:see HPI PUL:see HPI GI:no diarrhea constipation or melena, no indigestion GU:no hematuria, no dysuria MS:no joint pain, no claudication Neuro:no syncope, no lightheadedness Endo:no diabetes, no thyroid disease  Wt Readings from Last 3 Encounters:  08/23/13 195 lb 11.2 oz (88.769 kg)  08/06/13 200 lb 2.8 oz (90.8 kg)  08/06/13 200 lb 2.8 oz (90.8 kg)    PHYSICAL EXAM BP 128/80  Pulse 55  Ht 5\' 9"  (1.753 m)  Wt 195 lb 11.2 oz (88.769 kg)  BMI 28.89 kg/m2 General:Pleasant affect, NAD Skin:Warm and dry,  brisk capillary refill HEENT:normocephalic, sclera clear, mucus membranes moist Neck:supple, no JVD, no bruits  Heart:S1S2 RRR without murmur, gallup, rub or click Lungs:clear without rales, rhonchi, or wheezes RCB:ULAG, non tender, + BS, do not palpate liver spleen or masses Ext:no lower ext edema, 2+ pedal pulses, 2+ radial pulses Neuro:alert and oriented, MAE, follows commands, + facial symmetry  EKG:S brady with no acute changes  ASSESSMENT AND PLAN NSTEMI (non-ST elevated myocardial infarction) Stable without recurrent angina.  CAD in native artery Promus DESs placed to LCX, OM1 and LAD.  Stable today.  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  He will follow up with Dr. Alma Friendly in 6 weeks, but would like him seen prior to trip [place].    Hyperlipidemia LDL goal <70 Now on lipitor 80 mg.  Will check lipid panel in 5 weeks.

## 2013-08-23 NOTE — Assessment & Plan Note (Addendum)
Promus DESs placed to LCX, OM1 and LAD.  Stable today.  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  He will follow up with Dr. Alma Friendly in 6 weeks, but would like him seen prior to trip to [place].

## 2013-08-24 ENCOUNTER — Telehealth: Payer: Self-pay | Admitting: Internal Medicine

## 2013-08-25 NOTE — Telephone Encounter (Signed)
Closed encounter °

## 2013-08-29 ENCOUNTER — Telehealth: Payer: Self-pay | Admitting: Cardiovascular Disease

## 2013-08-29 NOTE — Telephone Encounter (Signed)
Calling about her echo that she had a few weeks ago . Please Call    Thanks

## 2013-08-29 NOTE — Telephone Encounter (Signed)
Spoke with pt, he is wondering if he needs to do blood work prior to seeing dr Debara Pickett. Explained his blood work would be 5 weeks after starting the lipitor 80 mg. Patient voiced understanding

## 2013-09-06 ENCOUNTER — Encounter: Payer: Self-pay | Admitting: Internal Medicine

## 2013-09-06 ENCOUNTER — Other Ambulatory Visit: Payer: Self-pay | Admitting: *Deleted

## 2013-09-06 ENCOUNTER — Ambulatory Visit (INDEPENDENT_AMBULATORY_CARE_PROVIDER_SITE_OTHER): Payer: Medicare HMO | Admitting: Internal Medicine

## 2013-09-06 VITALS — BP 132/76 | HR 56 | Ht 69.0 in | Wt 192.9 lb

## 2013-09-06 DIAGNOSIS — I2510 Atherosclerotic heart disease of native coronary artery without angina pectoris: Secondary | ICD-10-CM

## 2013-09-06 DIAGNOSIS — E785 Hyperlipidemia, unspecified: Secondary | ICD-10-CM

## 2013-09-06 DIAGNOSIS — I214 Non-ST elevation (NSTEMI) myocardial infarction: Secondary | ICD-10-CM

## 2013-09-06 MED ORDER — PRASUGREL HCL 10 MG PO TABS: 10.0000 mg | ORAL_TABLET | Freq: Every day | ORAL | Status: DC

## 2013-09-06 NOTE — Patient Instructions (Signed)
Your physician wants you to follow-up in: 6 months with Dr. Hilty. You will receive a reminder letter in the mail two months in advance. If you don't receive a letter, please call our office to schedule the follow-up appointment.    

## 2013-09-06 NOTE — Progress Notes (Signed)
Patient ID: Ronald Berger, male   DOB: [birth year], 68 y.o.   MRN: [id number]    OFFICE NOTE  Chief Complaint:  Follow-up status post cath and stent placement  Primary Care Physician: Ronald Lair, MD  HPI:  Ronald Berger 68 year old male admitted with NSTEMI, undergoing PCI with promus DES to LCX, OM1, LAD, peak troponin was 11. EF 55-65%. His LDL was 154. He is back today for follow up.   States he feels well. No recurrence of chest pain, dyspnea, palpitation, light headedness, or syncope. Does note some fatigue late in the day. Notes this is after being particularly active. This has been getting better. Is going to cardiac rehab in September. Is going to [place] in September. Notes taking nitroglycerin one time a few days after leaving hospital. He discussed this with Ronald Berger at his last visit. Notes this did not help chest pressure. Took tums and this resolved pain. No issues with this since that time.  Notes bruising easier than previously. Small amount of bleeding with brushing teeth. All since starting on effient.  PMHx:  Past Medical History  Diagnosis Date  . Myocardial infarction 08/02/13  . Environmental allergies   . Kidney stone   . Transfusion history     autologous transfusion during back surgery  . NSTEMI, initial episode of care 07/2013  . 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 Hyperlipidemia LDL goal <70     Past Surgical History  Procedure Laterality Date  . Back surgery      x3  . Kidney stone surgery    . Coronary angioplasty with stent placement  08/06/13    Promus DES to LCX, OM1, & LAD     FAMHx:  Family History  Problem Relation Age of Onset  . Coronary artery disease Mother     CABG  . CAD Sister   . Cancer Sister   . Alzheimer's disease Father   . Cancer Brother     SOCHx:   reports that he has never smoked. He has never used smokeless tobacco. He reports that he drinks about .6 ounces of alcohol per  week. He reports that he does not use illicit drugs.  ALLERGIES:  Allergies  Allergen Reactions  . Percocet [Oxycodone-Acetaminophen]     Nausea     ROS: A comprehensive review of systems was negative except for: Hematologic/lymphatic: positive for bleeding and easy bruising  HOME MEDS: Current Outpatient Prescriptions  Medication Sig Dispense Refill  . aspirin 81 MG chewable tablet Chew 1 tablet (81 mg total) by mouth daily.      Marland Kitchen atorvastatin (LIPITOR) 80 MG tablet Take 1 tablet (80 mg total) by mouth daily at 6 PM.  90 tablet  3  . metoprolol tartrate (LOPRESSOR) 25 MG tablet Take 0.5 tablets (12.5 mg total) by mouth 2 (two) times daily.  90 tablet  3  . nitroGLYCERIN (NITROSTAT) 0.4 MG SL tablet Place 1 tablet (0.4 mg total) under the tongue every 5 (five) minutes as needed for chest pain.  20 tablet  1  . omeprazole (PRILOSEC) 20 MG capsule Take 1 capsule (20 mg total) by mouth daily.  90 capsule  3  . prasugrel (EFFIENT) 10 MG TABS tablet Take 1 tablet (10 mg total) by mouth daily.  90 tablet  4  . zolpidem (AMBIEN) 10 MG tablet Take 10 mg by mouth at bedtime.       No current  facility-administered medications for this visit.    LABS/IMAGING: No results found for this or any previous visit (from the past 48 hour(s)). No results found.  VITALS: BP 132/76  Pulse 56  Ht 5\' 9"  (1.753 m)  Wt 192 lb 14.4 oz (87.499 kg)  BMI 28.47 kg/m2  EXAM: General appearance: alert, cooperative and no distress Neck: no adenopathy, no carotid bruit, no JVD and supple, symmetrical, trachea midline Lungs: clear to auscultation bilaterally Heart: regular rate and rhythm, S1, S2 normal, no murmur, click, rub or gallop Abdomen: soft, non-tender; bowel sounds normal; no masses,  no organomegaly Extremities: extremities normal, atraumatic, no cyanosis or edema Pulses: 2+ and symmetric radial    ASSESSMENT: 1. NSTEMI - stable with no recurrent symptoms 2. CAD in native artery - Promus  DESs placed to LCX, OM1 and LAD. Stable today. 3. Hyperlipidemia - LDL goal <70, last LDL 154  PLAN: 1.   Will continue effient and aspirin at this time. Discussed continuing effient at least for the next 6 months, with goal for up to one year. Follow-up in 6 months. 2.   Plan to start cardiac rehab in mid-September once he returns from his [place]. He is to start walking at home currently. 3.   Will continue lipitor 80 mg daily.  4.   Will return for labs at his convenience in the next several weeks to recheck lipid panel.  Ronald Berger 09/06/2013, 10:17 AM  Pt. Seen and examined. Agree with the resident note as written.  Ronald Berger is doing remarkably better after his multivessel PCI. He continues to have some problems with easy bruising and bleeding and is concerned about the cost of Effient, however understand the importance of this medicine. I would like for him to stand this plus aspirin for at least 6 months and then we could consider switching to Plavix at that time. He plans to start cardiac rehabilitation after he returns from his trip to [place]. Continue his high-dose statin and we will recheck a lipid profile as previously ordered.  Ronald Casino, MD, [facility] Attending Cardiologist Ronald Berger

## 2013-09-14 LAB — LIPID PANEL
CHOL/HDL RATIO: 2.2 ratio
CHOLESTEROL: 80 mg/dL (ref 0–200)
HDL: 36 mg/dL — AB (ref >39)
LDL Cholesterol: 31 mg/dL (ref 0–99)
Triglycerides: 66 mg/dL (ref <150)
VLDL: 13 mg/dL (ref 0–40)

## 2013-09-14 LAB — COMPREHENSIVE METABOLIC PANEL
ALK PHOS: 93 U/L (ref 39–117)
ALT: 34 U/L (ref 0–53)
AST: 26 U/L (ref 0–37)
Albumin: 4 g/dL (ref 3.5–5.2)
BILIRUBIN TOTAL: 0.5 mg/dL (ref 0.2–1.2)
BUN: 14 mg/dL (ref 6–23)
CO2: 28 mEq/L (ref 19–32)
Calcium: 9.1 mg/dL (ref 8.4–10.5)
Chloride: 105 mEq/L (ref 96–112)
Creat: 1.06 mg/dL (ref 0.50–1.35)
Glucose, Bld: 96 mg/dL (ref 70–99)
Potassium: 4.3 mEq/L (ref 3.5–5.3)
Sodium: 139 mEq/L (ref 135–145)
Total Protein: 6.8 g/dL (ref 6.0–8.3)

## 2013-09-20 ENCOUNTER — Telehealth: Payer: Self-pay | Admitting: *Deleted

## 2013-09-20 MED ORDER — ATORVASTATIN CALCIUM 40 MG PO TABS: 40.0000 mg | ORAL_TABLET | Freq: Every day | ORAL | Status: DC

## 2013-09-20 NOTE — Progress Notes (Signed)
LMTCB for lab results.  

## 2013-09-20 NOTE — Telephone Encounter (Signed)
Message copied by Fidel Levy on Tue Sep 20, 2013 11:45 AM ------      Message from: Isaiah Serge      Created: Mon Sep 19, 2013 10:08 PM       Can you notify mr. Loschiavo       ----- Message -----         From: Pixie Casino, MD         Sent: 09/19/2013   1:14 PM           To: Cecilie Kicks, NP            Yes .. Based on those numbers, please decrease lipitor to 40 mg. Newer randomized studies do not show benefit from fish oil, so I do not recommend that.            ----- Message -----         From: Cecilie Kicks, NP         Sent: 09/16/2013   8:03 AM           To: Pixie Casino, MD            Dr. Debara Pickett:  Could we decrease Lipitor to 40 mg and add fish oil?             ------

## 2013-09-20 NOTE — Telephone Encounter (Signed)
Patient notified of lab results and instructed to decrease lipitor to 40mg . Patient agreed with plan and voiced understanding. Rx for lipitor 40mg  sent to pharmacy

## 2013-10-06 ENCOUNTER — Ambulatory Visit: Payer: Medicare HMO | Admitting: Internal Medicine

## 2013-10-06 ENCOUNTER — Telehealth: Payer: Self-pay | Admitting: *Deleted

## 2013-10-06 NOTE — Telephone Encounter (Signed)
Faxed orders for phase 2 cardiac rehab - no graded exercise required.

## 2013-11-17 ENCOUNTER — Encounter (HOSPITAL_COMMUNITY): Payer: Self-pay

## 2013-11-17 ENCOUNTER — Encounter (HOSPITAL_COMMUNITY)
Admission: RE | Admit: 2013-11-17 | Discharge: 2013-11-17 | Disposition: A | Discharge status: Home / Self Care | Payer: Medicare HMO | Source: Ambulatory Visit | Attending: Internal Medicine | Admitting: Internal Medicine

## 2013-11-17 VITALS — BP 104/52 | HR 60 | Ht 69.0 in | Wt 183.3 lb

## 2013-11-17 DIAGNOSIS — I214 Non-ST elevation (NSTEMI) myocardial infarction: Secondary | ICD-10-CM

## 2013-11-17 DIAGNOSIS — Z9861 Coronary angioplasty status: Secondary | ICD-10-CM

## 2013-11-17 NOTE — Progress Notes (Signed)
Referred by Dr. Debara Pickett post MI ([phone])/Cardiac stent x 3 V45.82. During orientation advised patient on arrival and appointment times what to wear, what to do before, during and after exercise. Reviewed attendance and class policy. Talked about inclement weather and class consultation policy. Pt is scheduled to start Cardiac Rehab on 11/21/2013 at 0815. Pt was advised to come to class 5 minutes before class starts. He was also given instructions on meeting with the dietician and attending the Family Structure classes. Pt is eager to get started.  Patient was able to finish six minute walk test.

## 2013-11-17 NOTE — Patient Instructions (Signed)
Pt has finished orientation and is scheduled to start CR on 11/21/13 at 0815. Pt has been instructed to arrive to class 15 minutes early for scheduled class. Pt has been instructed to wear comfortable clothing and shoes with rubber soles. Pt has been told to take their medications 1 hour prior to coming to class.  If the patient is not going to attend class, he/she has been instructed to call.

## 2013-11-21 ENCOUNTER — Encounter (HOSPITAL_COMMUNITY)
Admission: RE | Admit: 2013-11-21 | Discharge: 2013-11-21 | Disposition: A | Discharge status: Home / Self Care | Payer: Medicare HMO | Source: Ambulatory Visit | Attending: Internal Medicine | Admitting: Internal Medicine

## 2013-11-21 DIAGNOSIS — Z5189 Encounter for other specified aftercare: Secondary | ICD-10-CM | POA: Insufficient documentation

## 2013-11-21 DIAGNOSIS — Z955 Presence of coronary angioplasty implant and graft: Secondary | ICD-10-CM | POA: Diagnosis not present

## 2013-11-21 DIAGNOSIS — I252 Old myocardial infarction: Secondary | ICD-10-CM | POA: Diagnosis not present

## 2013-11-23 ENCOUNTER — Encounter (HOSPITAL_COMMUNITY): Payer: Medicare HMO

## 2013-11-23 DIAGNOSIS — Z5189 Encounter for other specified aftercare: Secondary | ICD-10-CM | POA: Diagnosis not present

## 2013-11-25 ENCOUNTER — Encounter (HOSPITAL_COMMUNITY): Payer: Medicare HMO

## 2013-11-28 ENCOUNTER — Encounter (HOSPITAL_COMMUNITY)
Admission: RE | Admit: 2013-11-28 | Discharge: 2013-11-28 | Disposition: A | Discharge status: Home / Self Care | Payer: Medicare HMO | Source: Ambulatory Visit | Attending: Internal Medicine | Admitting: Internal Medicine

## 2013-11-28 DIAGNOSIS — Z5189 Encounter for other specified aftercare: Secondary | ICD-10-CM | POA: Diagnosis not present

## 2013-11-28 NOTE — Progress Notes (Signed)
Cardiac Rehabilitation Program Outcomes Report   Orientation:  11/17/13 Graduate Date:  tbd Discharge Date:  tbd # of sessions completed: 3  Cardiologist: Hilty Family MD:  Lorette Ang Time:  0815  A.  Exercise Program:  Tolerates exercise @ 4.0 METS for 15 minutes and Walk Test Results:  Pre: 3.3 mets  B.  Mental Health:  Good mental attitude  C.  Education/Instruction/Skills  Accurately checks own pulse.  Rest:  50  Exercise:  119 and Knows THR for exercise  Uses Perceived Exertion Scale and/or Dyspnea Scale  D.  Nutrition/Weight Control/Body Composition:  Adherence to prescribed nutrition program: good    E.  Blood Lipids    Lab Results  Component Value Date   CHOL 80 09/14/2013   HDL 36* 09/14/2013   LDLCALC 31 09/14/2013   TRIG 66 09/14/2013   CHOLHDL 2.2 09/14/2013    F.  Lifestyle Changes:  Making positive lifestyle changes  G.  Symptoms noted with exercise:  Asymptomatic  Report Completed By:  Benay Pillow RN   Comments:  First week progress note.

## 2013-11-30 ENCOUNTER — Encounter (HOSPITAL_COMMUNITY): Payer: Medicare HMO

## 2013-12-02 ENCOUNTER — Encounter (HOSPITAL_COMMUNITY)
Admission: RE | Admit: 2013-12-02 | Discharge: 2013-12-02 | Disposition: A | Discharge status: Home / Self Care | Payer: Medicare HMO | Source: Ambulatory Visit | Attending: Internal Medicine | Admitting: Internal Medicine

## 2013-12-02 DIAGNOSIS — Z5189 Encounter for other specified aftercare: Secondary | ICD-10-CM | POA: Diagnosis not present

## 2013-12-05 ENCOUNTER — Encounter (HOSPITAL_COMMUNITY)
Admission: RE | Admit: 2013-12-05 | Discharge: 2013-12-05 | Disposition: A | Discharge status: Home / Self Care | Payer: Medicare HMO | Source: Ambulatory Visit | Attending: Internal Medicine | Admitting: Internal Medicine

## 2013-12-05 DIAGNOSIS — Z5189 Encounter for other specified aftercare: Secondary | ICD-10-CM | POA: Diagnosis not present

## 2013-12-07 ENCOUNTER — Encounter (HOSPITAL_COMMUNITY)
Admission: RE | Admit: 2013-12-07 | Discharge: 2013-12-07 | Disposition: A | Discharge status: Home / Self Care | Payer: Medicare HMO | Source: Ambulatory Visit | Attending: Internal Medicine | Admitting: Internal Medicine

## 2013-12-07 DIAGNOSIS — Z5189 Encounter for other specified aftercare: Secondary | ICD-10-CM | POA: Diagnosis not present

## 2013-12-09 ENCOUNTER — Encounter (HOSPITAL_COMMUNITY)
Admission: RE | Admit: 2013-12-09 | Discharge: 2013-12-09 | Disposition: A | Discharge status: Home / Self Care | Payer: Medicare HMO | Source: Ambulatory Visit | Attending: Internal Medicine | Admitting: Internal Medicine

## 2013-12-09 DIAGNOSIS — Z5189 Encounter for other specified aftercare: Secondary | ICD-10-CM | POA: Diagnosis not present

## 2013-12-12 ENCOUNTER — Encounter (HOSPITAL_COMMUNITY)
Admission: RE | Admit: 2013-12-12 | Discharge: 2013-12-12 | Disposition: A | Discharge status: Home / Self Care | Payer: Medicare HMO | Source: Ambulatory Visit | Attending: Internal Medicine | Admitting: Internal Medicine

## 2013-12-12 DIAGNOSIS — Z5189 Encounter for other specified aftercare: Secondary | ICD-10-CM | POA: Diagnosis not present

## 2013-12-14 ENCOUNTER — Encounter (HOSPITAL_COMMUNITY)
Admission: RE | Admit: 2013-12-14 | Discharge: 2013-12-14 | Disposition: A | Discharge status: Home / Self Care | Payer: Medicare HMO | Source: Ambulatory Visit | Attending: Internal Medicine | Admitting: Internal Medicine

## 2013-12-14 DIAGNOSIS — Z5189 Encounter for other specified aftercare: Secondary | ICD-10-CM | POA: Diagnosis not present

## 2013-12-16 ENCOUNTER — Encounter (HOSPITAL_COMMUNITY): Payer: Medicare HMO

## 2013-12-19 ENCOUNTER — Encounter (HOSPITAL_COMMUNITY)
Admission: RE | Admit: 2013-12-19 | Discharge: 2013-12-19 | Disposition: A | Discharge status: Home / Self Care | Payer: Medicare HMO | Source: Ambulatory Visit | Attending: Internal Medicine | Admitting: Internal Medicine

## 2013-12-19 DIAGNOSIS — Z5189 Encounter for other specified aftercare: Secondary | ICD-10-CM | POA: Diagnosis not present

## 2013-12-21 ENCOUNTER — Encounter (HOSPITAL_COMMUNITY)
Admission: RE | Admit: 2013-12-21 | Discharge: 2013-12-21 | Disposition: A | Discharge status: Home / Self Care | Payer: Medicare HMO | Source: Ambulatory Visit | Attending: Internal Medicine | Admitting: Internal Medicine

## 2013-12-21 DIAGNOSIS — Z5189 Encounter for other specified aftercare: Secondary | ICD-10-CM | POA: Insufficient documentation

## 2013-12-21 DIAGNOSIS — I252 Old myocardial infarction: Secondary | ICD-10-CM | POA: Diagnosis not present

## 2013-12-21 DIAGNOSIS — Z955 Presence of coronary angioplasty implant and graft: Secondary | ICD-10-CM | POA: Diagnosis not present

## 2013-12-22 ENCOUNTER — Telehealth: Payer: Self-pay | Admitting: Internal Medicine

## 2013-12-22 ENCOUNTER — Other Ambulatory Visit: Payer: Self-pay | Admitting: *Deleted

## 2013-12-22 MED ORDER — NITROGLYCERIN 0.4 MG SL SUBL: 0.4000 mg | SUBLINGUAL_TABLET | SUBLINGUAL | Status: DC | PRN

## 2013-12-22 NOTE — Telephone Encounter (Signed)
Script sent to the pharm. 

## 2013-12-22 NOTE — Telephone Encounter (Signed)
Pt need a new prescription for his nitroglycerin. Please call to [phone].

## 2013-12-23 ENCOUNTER — Encounter (HOSPITAL_COMMUNITY)
Admission: RE | Admit: 2013-12-23 | Discharge: 2013-12-23 | Disposition: A | Discharge status: Home / Self Care | Payer: Medicare HMO | Source: Ambulatory Visit | Attending: Internal Medicine | Admitting: Internal Medicine

## 2013-12-23 DIAGNOSIS — Z5189 Encounter for other specified aftercare: Secondary | ICD-10-CM | POA: Diagnosis not present

## 2013-12-26 ENCOUNTER — Encounter (HOSPITAL_COMMUNITY)
Admission: RE | Admit: 2013-12-26 | Discharge: 2013-12-26 | Disposition: A | Discharge status: Home / Self Care | Payer: Medicare HMO | Source: Ambulatory Visit | Attending: Internal Medicine | Admitting: Internal Medicine

## 2013-12-26 DIAGNOSIS — Z5189 Encounter for other specified aftercare: Secondary | ICD-10-CM | POA: Diagnosis not present

## 2013-12-28 ENCOUNTER — Encounter (HOSPITAL_COMMUNITY)
Admission: RE | Admit: 2013-12-28 | Discharge: 2013-12-28 | Disposition: A | Discharge status: Home / Self Care | Payer: Medicare HMO | Source: Ambulatory Visit | Attending: Internal Medicine | Admitting: Internal Medicine

## 2013-12-28 DIAGNOSIS — Z5189 Encounter for other specified aftercare: Secondary | ICD-10-CM | POA: Diagnosis not present

## 2013-12-29 ENCOUNTER — Encounter (HOSPITAL_COMMUNITY): Payer: Self-pay | Admitting: Cardiovascular Disease

## 2013-12-30 ENCOUNTER — Encounter (HOSPITAL_COMMUNITY)
Admission: RE | Admit: 2013-12-30 | Discharge: 2013-12-30 | Disposition: A | Discharge status: Home / Self Care | Payer: Medicare HMO | Source: Ambulatory Visit | Attending: Internal Medicine | Admitting: Internal Medicine

## 2013-12-30 DIAGNOSIS — Z5189 Encounter for other specified aftercare: Secondary | ICD-10-CM | POA: Diagnosis not present

## 2014-01-02 ENCOUNTER — Encounter (HOSPITAL_COMMUNITY)
Admission: RE | Admit: 2014-01-02 | Discharge: 2014-01-02 | Disposition: A | Discharge status: Home / Self Care | Payer: Medicare HMO | Source: Ambulatory Visit | Attending: Internal Medicine | Admitting: Internal Medicine

## 2014-01-02 DIAGNOSIS — Z5189 Encounter for other specified aftercare: Secondary | ICD-10-CM | POA: Diagnosis not present

## 2014-01-04 ENCOUNTER — Encounter (HOSPITAL_COMMUNITY)
Admission: RE | Admit: 2014-01-04 | Discharge: 2014-01-04 | Disposition: A | Discharge status: Home / Self Care | Payer: Medicare HMO | Source: Ambulatory Visit | Attending: Internal Medicine | Admitting: Internal Medicine

## 2014-01-04 DIAGNOSIS — Z5189 Encounter for other specified aftercare: Secondary | ICD-10-CM | POA: Diagnosis not present

## 2014-01-06 ENCOUNTER — Encounter (HOSPITAL_COMMUNITY)
Admission: RE | Admit: 2014-01-06 | Discharge: 2014-01-06 | Disposition: A | Discharge status: Home / Self Care | Payer: Medicare HMO | Source: Ambulatory Visit | Attending: Internal Medicine | Admitting: Internal Medicine

## 2014-01-06 DIAGNOSIS — Z5189 Encounter for other specified aftercare: Secondary | ICD-10-CM | POA: Diagnosis not present

## 2014-01-06 NOTE — Progress Notes (Signed)
.  cr                           Cardiac Rehabilitation Program Outcomes Report   Orientation:  11/17/13 Graduate Date:  tbd  Discharge Date:  tbd # of sessions completed: 18  Cardiologist: Hilty Family MD:  Lorette Ang Time:  0815  A.  Exercise Program:  Tolerates exercise @ 3.56 METS for 30 minutes  B.  Mental Health:  Good mental attitude  C.  Education/Instruction/Skills  Accurately checks own pulse.  Rest:  54  Exercise:  99, Knows THR for exercise and Uses Perceived Exertion Scale and/or Dyspnea Scale   D.  Nutrition/Weight Control/Body Composition:  Adherence to prescribed nutrition program: good    E.  Blood Lipids    Lab Results  Component Value Date   CHOL 80 09/14/2013   HDL 36* 09/14/2013   LDLCALC 31 09/14/2013   TRIG 66 09/14/2013   CHOLHDL 2.2 09/14/2013    F.  Lifestyle Changes:  Making positive lifestyle changes  G.  Symptoms noted with exercise:  Asymptomatic  Report Completed By:  Felicity Coyer, RN   Comments:  Halfway note.

## 2014-01-09 ENCOUNTER — Encounter (HOSPITAL_COMMUNITY)
Admission: RE | Admit: 2014-01-09 | Discharge: 2014-01-09 | Disposition: A | Discharge status: Home / Self Care | Payer: Medicare HMO | Source: Ambulatory Visit | Attending: Internal Medicine | Admitting: Internal Medicine

## 2014-01-09 DIAGNOSIS — Z5189 Encounter for other specified aftercare: Secondary | ICD-10-CM | POA: Diagnosis not present

## 2014-01-11 ENCOUNTER — Encounter (HOSPITAL_COMMUNITY)
Admission: RE | Admit: 2014-01-11 | Discharge: 2014-01-11 | Disposition: A | Discharge status: Home / Self Care | Payer: Medicare HMO | Source: Ambulatory Visit | Attending: Internal Medicine | Admitting: Internal Medicine

## 2014-01-11 DIAGNOSIS — Z5189 Encounter for other specified aftercare: Secondary | ICD-10-CM | POA: Diagnosis not present

## 2014-01-13 ENCOUNTER — Encounter (HOSPITAL_COMMUNITY): Payer: Medicare HMO

## 2014-01-16 ENCOUNTER — Encounter (HOSPITAL_COMMUNITY)
Admission: RE | Admit: 2014-01-16 | Discharge: 2014-01-16 | Disposition: A | Discharge status: Home / Self Care | Payer: Medicare HMO | Source: Ambulatory Visit | Attending: Internal Medicine | Admitting: Internal Medicine

## 2014-01-16 DIAGNOSIS — Z5189 Encounter for other specified aftercare: Secondary | ICD-10-CM | POA: Diagnosis not present

## 2014-01-18 ENCOUNTER — Encounter (HOSPITAL_COMMUNITY)
Admission: RE | Admit: 2014-01-18 | Discharge: 2014-01-18 | Disposition: A | Discharge status: Home / Self Care | Payer: Medicare HMO | Source: Ambulatory Visit | Attending: Internal Medicine | Admitting: Internal Medicine

## 2014-01-18 DIAGNOSIS — Z5189 Encounter for other specified aftercare: Secondary | ICD-10-CM | POA: Diagnosis not present

## 2014-01-20 ENCOUNTER — Encounter (HOSPITAL_COMMUNITY): Payer: Medicare HMO

## 2014-01-23 ENCOUNTER — Encounter (HOSPITAL_COMMUNITY)
Admission: RE | Admit: 2014-01-23 | Discharge: 2014-01-23 | Disposition: A | Discharge status: Home / Self Care | Payer: Medicare HMO | Source: Ambulatory Visit | Attending: Internal Medicine | Admitting: Internal Medicine

## 2014-01-23 DIAGNOSIS — I252 Old myocardial infarction: Secondary | ICD-10-CM | POA: Diagnosis not present

## 2014-01-23 DIAGNOSIS — Z955 Presence of coronary angioplasty implant and graft: Secondary | ICD-10-CM | POA: Diagnosis not present

## 2014-01-23 DIAGNOSIS — Z5189 Encounter for other specified aftercare: Secondary | ICD-10-CM | POA: Insufficient documentation

## 2014-01-25 ENCOUNTER — Encounter (HOSPITAL_COMMUNITY)
Admission: RE | Admit: 2014-01-25 | Discharge: 2014-01-25 | Disposition: A | Discharge status: Home / Self Care | Payer: Medicare HMO | Source: Ambulatory Visit | Attending: Internal Medicine | Admitting: Internal Medicine

## 2014-01-25 DIAGNOSIS — Z5189 Encounter for other specified aftercare: Secondary | ICD-10-CM | POA: Diagnosis not present

## 2014-01-27 ENCOUNTER — Encounter (HOSPITAL_COMMUNITY)
Admission: RE | Admit: 2014-01-27 | Discharge: 2014-01-27 | Disposition: A | Discharge status: Home / Self Care | Payer: Medicare HMO | Source: Ambulatory Visit | Attending: Internal Medicine | Admitting: Internal Medicine

## 2014-01-27 DIAGNOSIS — Z5189 Encounter for other specified aftercare: Secondary | ICD-10-CM | POA: Diagnosis not present

## 2014-01-30 ENCOUNTER — Encounter (HOSPITAL_COMMUNITY)
Admission: RE | Admit: 2014-01-30 | Discharge: 2014-01-30 | Disposition: A | Discharge status: Home / Self Care | Payer: Medicare HMO | Source: Ambulatory Visit | Attending: Internal Medicine | Admitting: Internal Medicine

## 2014-01-30 DIAGNOSIS — Z5189 Encounter for other specified aftercare: Secondary | ICD-10-CM | POA: Diagnosis not present

## 2014-02-01 ENCOUNTER — Encounter (HOSPITAL_COMMUNITY)
Admission: RE | Admit: 2014-02-01 | Discharge: 2014-02-01 | Disposition: A | Discharge status: Home / Self Care | Payer: Medicare HMO | Source: Ambulatory Visit | Attending: Internal Medicine | Admitting: Internal Medicine

## 2014-02-01 DIAGNOSIS — Z5189 Encounter for other specified aftercare: Secondary | ICD-10-CM | POA: Diagnosis not present

## 2014-02-03 ENCOUNTER — Encounter (HOSPITAL_COMMUNITY)
Admission: RE | Admit: 2014-02-03 | Discharge: 2014-02-03 | Disposition: A | Discharge status: Home / Self Care | Payer: Medicare HMO | Source: Ambulatory Visit | Attending: Internal Medicine | Admitting: Internal Medicine

## 2014-02-03 DIAGNOSIS — Z5189 Encounter for other specified aftercare: Secondary | ICD-10-CM | POA: Diagnosis not present

## 2014-02-06 ENCOUNTER — Encounter (HOSPITAL_COMMUNITY)
Admission: RE | Admit: 2014-02-06 | Discharge: 2014-02-06 | Disposition: A | Discharge status: Home / Self Care | Payer: Medicare HMO | Source: Ambulatory Visit | Attending: Internal Medicine | Admitting: Internal Medicine

## 2014-02-06 DIAGNOSIS — Z5189 Encounter for other specified aftercare: Secondary | ICD-10-CM | POA: Diagnosis not present

## 2014-02-08 ENCOUNTER — Encounter (HOSPITAL_COMMUNITY): Payer: Medicare HMO

## 2014-02-10 ENCOUNTER — Encounter (HOSPITAL_COMMUNITY): Payer: Medicare HMO

## 2014-02-14 ENCOUNTER — Observation Stay (HOSPITAL_COMMUNITY)
Admission: EM | Admit: 2014-02-14 | Discharge: 2014-02-15 | Disposition: A | Discharge status: Home / Self Care | Payer: Medicare HMO | Attending: Cardiovascular Disease | Admitting: Cardiovascular Disease

## 2014-02-14 ENCOUNTER — Telehealth: Payer: Self-pay | Admitting: Internal Medicine

## 2014-02-14 ENCOUNTER — Emergency Department (HOSPITAL_COMMUNITY): Payer: Medicare HMO

## 2014-02-14 ENCOUNTER — Encounter (HOSPITAL_COMMUNITY): Payer: Self-pay

## 2014-02-14 DIAGNOSIS — K219 Gastro-esophageal reflux disease without esophagitis: Secondary | ICD-10-CM | POA: Diagnosis present

## 2014-02-14 DIAGNOSIS — Z87442 Personal history of urinary calculi: Secondary | ICD-10-CM | POA: Insufficient documentation

## 2014-02-14 DIAGNOSIS — R079 Chest pain, unspecified: Secondary | ICD-10-CM

## 2014-02-14 DIAGNOSIS — R0789 Other chest pain: Principal | ICD-10-CM | POA: Diagnosis present

## 2014-02-14 DIAGNOSIS — G4733 Obstructive sleep apnea (adult) (pediatric): Secondary | ICD-10-CM | POA: Insufficient documentation

## 2014-02-14 DIAGNOSIS — I252 Old myocardial infarction: Secondary | ICD-10-CM | POA: Insufficient documentation

## 2014-02-14 DIAGNOSIS — Z955 Presence of coronary angioplasty implant and graft: Secondary | ICD-10-CM | POA: Diagnosis not present

## 2014-02-14 DIAGNOSIS — I2510 Atherosclerotic heart disease of native coronary artery without angina pectoris: Secondary | ICD-10-CM | POA: Diagnosis not present

## 2014-02-14 DIAGNOSIS — R001 Bradycardia, unspecified: Secondary | ICD-10-CM | POA: Diagnosis present

## 2014-02-14 DIAGNOSIS — E785 Hyperlipidemia, unspecified: Secondary | ICD-10-CM | POA: Diagnosis present

## 2014-02-14 HISTORY — DX: Bradycardia, unspecified: R00.1

## 2014-02-14 HISTORY — DX: Gastro-esophageal reflux disease without esophagitis: K21.9

## 2014-02-14 HISTORY — DX: Obstructive sleep apnea (adult) (pediatric): G47.33

## 2014-02-14 HISTORY — DX: Unspecified asthma, uncomplicated: J45.909

## 2014-02-14 LAB — BASIC METABOLIC PANEL
Anion gap: 6 (ref 5–15)
BUN: 14 mg/dL (ref 6–23)
CO2: 28 mmol/L (ref 19–32)
Calcium: 9.3 mg/dL (ref 8.4–10.5)
Chloride: 107 mmol/L (ref 96–112)
Creatinine, Ser: 1.12 mg/dL (ref 0.50–1.35)
GFR calc Af Amer: 77 mL/min — ABNORMAL LOW (ref >90)
GFR, EST NON AFRICAN AMERICAN: 67 mL/min — AB (ref >90)
Glucose, Bld: 77 mg/dL (ref 70–99)
Potassium: 4.2 mmol/L (ref 3.5–5.1)
Sodium: 141 mmol/L (ref 135–145)

## 2014-02-14 LAB — CBC
HCT: 45.8 % (ref 39.0–52.0)
Hemoglobin: 15.5 g/dL (ref 13.0–17.0)
MCH: 28.4 pg (ref 26.0–34.0)
MCHC: 33.8 g/dL (ref 30.0–36.0)
MCV: 84 fL (ref 78.0–100.0)
Platelets: 182 10*3/uL (ref 150–400)
RBC: 5.45 MIL/uL (ref 4.22–5.81)
RDW: 12.7 % (ref 11.5–15.5)
WBC: 9 10*3/uL (ref 4.0–10.5)

## 2014-02-14 LAB — TROPONIN I
Troponin I: <0.03 ng/mL (ref <0.031)
Troponin I: <0.03 ng/mL (ref <0.031)

## 2014-02-14 LAB — I-STAT TROPONIN, ED: Troponin i, poc: 0 ng/mL (ref 0.00–0.08)

## 2014-02-14 LAB — TSH: TSH: 4.248 u[IU]/mL (ref 0.350–4.500)

## 2014-02-14 MED ORDER — PANTOPRAZOLE SODIUM 40 MG PO TBEC
40.0000 mg | DELAYED_RELEASE_TABLET | Freq: Every day | ORAL | Status: DC
  Administered 2014-02-15: 40 mg via ORAL
  Filled 2014-02-14: qty 1

## 2014-02-14 MED ORDER — ZOLPIDEM TARTRATE 5 MG PO TABS: 10.0000 mg | ORAL_TABLET | Freq: Every day | ORAL | Status: DC

## 2014-02-14 MED ORDER — KETOROLAC TROMETHAMINE 30 MG/ML IJ SOLN
30.0000 mg | Freq: Four times a day (QID) | INTRAMUSCULAR | Status: DC | PRN
  Filled 2014-02-14: qty 1

## 2014-02-14 MED ORDER — NITROGLYCERIN 0.4 MG SL SUBL: 0.4000 mg | SUBLINGUAL_TABLET | SUBLINGUAL | Status: DC | PRN

## 2014-02-14 MED ORDER — ATORVASTATIN CALCIUM 40 MG PO TABS
40.0000 mg | ORAL_TABLET | Freq: Every day | ORAL | Status: DC
  Administered 2014-02-14: 40 mg via ORAL
  Filled 2014-02-14 (×2): qty 1

## 2014-02-14 MED ORDER — HEPARIN SODIUM (PORCINE) 5000 UNIT/ML IJ SOLN
5000.0000 [IU] | Freq: Three times a day (TID) | INTRAMUSCULAR | Status: DC
  Filled 2014-02-14 (×3): qty 1

## 2014-02-14 MED ORDER — METOPROLOL TARTRATE 12.5 MG HALF TABLET
12.5000 mg | ORAL_TABLET | Freq: Two times a day (BID) | ORAL | Status: DC
  Administered 2014-02-14 – 2014-02-15 (×2): 12.5 mg via ORAL
  Filled 2014-02-14 (×3): qty 1

## 2014-02-14 MED ORDER — ONDANSETRON HCL 4 MG/2ML IJ SOLN: 4.0000 mg | Freq: Four times a day (QID) | INTRAMUSCULAR | Status: DC | PRN

## 2014-02-14 MED ORDER — ZOLPIDEM TARTRATE 5 MG PO TABS
5.0000 mg | ORAL_TABLET | Freq: Every day | ORAL | Status: DC
  Administered 2014-02-14: 5 mg via ORAL
  Filled 2014-02-14: qty 1

## 2014-02-14 MED ORDER — PRASUGREL HCL 10 MG PO TABS
10.0000 mg | ORAL_TABLET | Freq: Every day | ORAL | Status: DC
  Administered 2014-02-15: 10 mg via ORAL
  Filled 2014-02-14: qty 1

## 2014-02-14 MED ORDER — ASPIRIN 81 MG PO CHEW
81.0000 mg | CHEWABLE_TABLET | Freq: Every day | ORAL | Status: DC
Start: 2014-02-15 — End: 2014-02-15
  Administered 2014-02-15: 81 mg via ORAL
  Filled 2014-02-14: qty 1

## 2014-02-14 MED ORDER — ACETAMINOPHEN 325 MG PO TABS: 650.0000 mg | ORAL_TABLET | ORAL | Status: DC | PRN

## 2014-02-14 NOTE — ED Provider Notes (Signed)
CSN: [id number]     Arrival date & time 02/14/14  1239 History   First MD Initiated Contact with Patient 02/14/14 1445     Chief Complaint  Patient presents with  . Chest Pain     (Consider location/radiation/quality/duration/timing/severity/associated sxs/prior Treatment) Patient is a 69 y.o. male presenting with chest pain. The history is provided by the patient. No language interpreter was used.  Chest Pain Pain location:  L chest Pain quality: aching   Pain radiates to:  Does not radiate Pain radiates to the back: no   Pain severity:  Moderate Onset quality:  Unable to specify Timing:  Constant Progression:  Worsening Chronicity:  New Relieved by:  Nothing Worsened by:  Nothing tried Ineffective treatments:  None tried Associated symptoms: no nausea, no numbness and no orthopnea   Risk factors: no hypertension   Pt had an MI  With  cardiac cath and 3 vessel bypass August 05 2013.  Pt reports he had chest tightness for the past 3 days.  Pt reports pain left chest today.   Pt called Dr. Lysbeth Penner office and he was advised to take nitroglycerin.   Pt reports no relief.  Pt reports this pain reminds him of pain her had when he had an MI.  Past Medical History  Diagnosis Date  . Myocardial infarction 08/02/13  . Environmental allergies   . Kidney stone   . Transfusion history     autologous transfusion during back surgery  . NSTEMI, initial episode of care 07/2013  . 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 Hyperlipidemia LDL goal <70    Past Surgical History  Procedure Laterality Date  . Back surgery      x3  . Kidney stone surgery    . Coronary angioplasty with stent placement  08/06/13    Promus DES to LCX, OM1, & LAD   . Left heart catheterization with coronary angiogram N/A 08/05/2013    Procedure: LEFT HEART CATHETERIZATION WITH CORONARY ANGIOGRAM;  Surgeon: Blane Ohara, MD;  Location: [facility] CATH LAB;  Service: Cardiovascular;  Laterality: N/A;    Family History  Problem Relation Age of Onset  . Coronary artery disease Mother     CABG  . CAD Sister   . Cancer Sister   . Alzheimer's disease Father   . Cancer Brother    History  Substance Use Topics  . Smoking status: Never Smoker   . Smokeless tobacco: Never Used  . Alcohol Use: 0.6 oz/week    1 Cans of beer per week     Comment: occ     Review of Systems  Cardiovascular: Positive for chest pain. Negative for orthopnea.  Gastrointestinal: Negative for nausea.  Neurological: Negative for numbness.  All other systems reviewed and are negative.     Allergies  Percocet  Home Medications   Prior to Admission medications   Medication Sig Start Date End Date Taking? Authorizing Provider  aspirin 81 MG chewable tablet Chew 1 tablet (81 mg total) by mouth daily. 08/06/13   Delfina Redwood, MD  atorvastatin (LIPITOR) 40 MG tablet Take 1 tablet (40 mg total) by mouth daily at 6 PM. 09/20/13   Isaiah Serge, NP  metoprolol tartrate (LOPRESSOR) 25 MG tablet Take 0.5 tablets (12.5 mg total) by mouth 2 (two) times daily. 08/23/13   Isaiah Serge, NP  nitroGLYCERIN (NITROSTAT) 0.4 MG SL tablet Place 1 tablet (0.4 mg total) under the tongue  every 5 (five) minutes as needed for chest pain. 12/22/13   Lelon Perla, MD  omeprazole (PRILOSEC) 20 MG capsule Take 1 capsule (20 mg total) by mouth daily. 08/23/13   Isaiah Serge, NP  prasugrel (EFFIENT) 10 MG TABS tablet Take 1 tablet (10 mg total) by mouth daily. 09/06/13   Pixie Casino, MD  zolpidem (AMBIEN) 10 MG tablet Take 10 mg by mouth at bedtime.    Historical Provider, MD   BP 119/71 mmHg  Pulse 58  Temp(Src) 97.9 F (36.6 C) (Oral)  Resp 15  Ht 5\' 10"  (1.778 m)  Wt 180 lb (81.647 kg)  BMI 25.83 kg/m2  SpO2 99% Physical Exam  Constitutional: He is oriented to person, place, and time. He appears well-developed and well-nourished.  HENT:  Head: Normocephalic and atraumatic.  Mouth/Throat: Oropharynx is clear and  moist.  Eyes: Conjunctivae and EOM are normal. Pupils are equal, round, and reactive to light.  Neck: Normal range of motion.  Cardiovascular: Normal rate and regular rhythm.   Pulmonary/Chest: Effort normal and breath sounds normal.  Abdominal: Soft. He exhibits no distension.  Musculoskeletal: Normal range of motion.  Neurological: He is alert and oriented to person, place, and time.  Skin: Skin is warm.  Psychiatric: He has a normal mood and affect.  Nursing note and vitals reviewed.   ED Course  Procedures (including critical care time) Labs Review Labs Reviewed  BASIC METABOLIC PANEL - Abnormal; Notable for the following:    GFR calc non Af Amer 67 (*)    GFR calc Af Amer 77 (*)    All other components within normal limits  CBC  I-STAT TROPOININ, ED    Imaging Review Dg Chest 2 View  02/14/2014   CLINICAL DATA:  Three-day history of left-sided chest pain  EXAM: CHEST  2 VIEW  COMPARISON:  August 04, 2013  FINDINGS: There is no edema or consolidation. The heart size and pulmonary vascularity are normal. No adenopathy. No bone lesions. There are stents in the left anterior descending and circumflex coronary arteries.  IMPRESSION: No edema or consolidation.   Electronically Signed   By: Lowella Grip M.D.   On: 02/14/2014 13:57     EKG Interpretation None    sinus brady 59 normal axis, no st or t changes.   MDM   Final diagnoses:  Chest pain    I spoke with trish with cardiology.   Cardiology will see and evaluate.    Hollace Kinnier [place], PA-C 02/14/14 [place], MD 02/14/14 [phone]

## 2014-02-14 NOTE — H&P (Signed)
History and Physical  Patient ID: Ronald Berger MRN: [id number], DOB: 05-09-1945 Date of Encounter: 02/14/2014, 4:24 PM Primary Physician: Deloria Lair, MD Primary Cardiologist: Dr. Debara Pickett  Chief Complaint: chest pain Reason for Admission: chest pain  HPI: Ronald Berger is a 69 y/o M with history of CAD (NSTEMI 07/2013 s/p DES to mLCx/OM1/mLAD with nonobstructive RCA disease, normal EF), HLD, GERD who presented to [facility] today with constant chest pain.  He has done well since his PCI. He has been participating regularly in cardiac rehab without any recent exertional symptoms. He has been compliant with medication. For the past 3 days he developed sharp chest discomfort slightly to the left of his sternum. It does not radiate. He denies any associated SOB, nausea, vomiting, diaphoresis, palpitations, near-syncope, syncope or bleeding. The pain is not similar to his prior angina (that was more pressure-like and with associated sx). It is worse with palpation. It is not usually worse with exertion but has been on a few occasions. Pain is not worse with inspiration or recumbency. He took Tums and several SL NTG without any change in discomfort. The discomfort has tended to come on in the morning, last all day long, then resolve at night when lying down. He walks his dog 3-4 times a day and wonders if walking her in the snow (which was more difficult) could've led to some shoulder straining. No recent travel, surgery, bedrest, LEE, or orthopnea. His wife is a Marine scientist and urged him to get checked out today. In the ED, VSS except mild sinus bradycardia, not tachycardic, tachypneic or hypoxic. Normal CBC, BMET, troponin x 1. CXR no edema or consolidation.   Past Medical History  Diagnosis Date  . Myocardial infarction 08/02/13  . Environmental allergies   . Kidney stone   . Transfusion history     autologous transfusion during back surgery  . 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 NSTEMI 07/2013 - DES to mLCx,  DES to prox OM1, DES to mLAD with nonobstructive RCA stenosis, EF 55-65%.  . Hyperlipidemia LDL goal <70   . GERD (gastroesophageal reflux disease)   . Sinus bradycardia      Most Recent Cardiac Studies: Cath 07/2013 Cardiac Catheterization Procedure Note  Procedure: Left Heart Cath, Selective Coronary Angiography, LV angiography, PTCA and stenting of the LCx, first OM, and mid-LAD  Indication: NSTEMI  Procedural Details: The right wrist was prepped, draped, and anesthetized with 1% lidocaine. Using the modified Seldinger technique, a 5 French sheath was introduced into the right radial artery. 3 mg of verapamil was administered through the sheath, weight-based unfractionated heparin was administered intravenously. Standard Judkins catheters were used for selective coronary angiography and left ventriculography. Catheter exchanges were performed over an exchange length guidewire.  PROCEDURAL FINDINGS Hemodynamics: AO 124/69 LV 124/20  Coronary angiography: Coronary dominance: right  Left mainstem: The left main is patent with mild 20-30% distal stenosis. It divides into the LAD and left circumflex.  Left anterior descending (LAD): The LAD is a large caliber vessel. There is heavy calcification vessel with associated 75% stenosis. Beyond the area of stenosis the LAD is mildly dilated. There is no further significant disease portion of the LAD there is another 75% stenosis. I  Left circumflex (LCx): The left circumflex is of large caliber. The mid circumflex has a 95% eccentric plaque leading into a large posterolateral branch. The proximal circumflex has 20-30% stenosis. The first OM branch is medium in caliber and it has a 90% stenosis in  its midportion. The ostium of that OM has nonobstructive disease with 30% stenosis.  Right coronary artery (RCA): The vessel is diffusely diseased. The proximal vessel is mildly ectatic without significant stenosis. The mid vessel has 30%  stenosis. The distal vessel is irregular without high-grade stenosis. The PDA branch is small with no significant disease. PLA branch is medium in caliber with mild diffuse stenosis. The acute marginal branch is patent without significant stenosis.  Left ventriculography: Left ventricular systolic function is normal, LVEF is estimated at 55-65%, there is no significant mitral regurgitation   PCI Note: Following the diagnostic procedure, the decision was made to proceed with PCI. The patient had focal disease involving the mid circumflex which I thought was his culprit vessel. He also had severe stenosis of the first OM branch and the mid LAD. Attention was first turned to the mid circumflex lesion as it was the most critical blockage. The patient was loaded with effient 60 mg. Angiomax was used for anticoagulation. Once a therapeutic ACT was achieved, a 6 [place] XB LAD 3.5 cm guide catheter was inserted. A cougar coronary guidewire was used to cross the lesion. The lesion was predilated with a 2.5 mm balloon. The lesion was then stented with a 3.0 by 62mm Promus premier drug-eluting stent. The stent was postdilated with a 3.25 mm noncompliant balloon to 16 atmospheres. Following PCI, there was 0% residual stenosis and TIMI-3 flow. The cougar wire was then redirected beyond the severe stenosis in the first obtuse marginal branch. The lesion was predilated with the same 2.5 x 12 mm balloon to 6 atmospheres. It was then stented with a 2.5 by 94mm Promus premier DES to 12 atmospheres. The stent was well expanded and appear to have a step up and step down off the proximal and distal edges, respectively. Post dilatation was not performed. Attention was then turned to the mid LAD. The same guidewire was advanced across the lesion in the mid LAD. The vessel was predilated with a 2.5 x 12 mm balloon. It was then stented with a 3.5 x 20 mm Promus premier DES which was deployed at 16 atmospheres. The stent was  postdilated with a 3.75 mm noncompliant balloon to 18 atmospheres. There was 0% residual stenosis and TIMI-3 flow. Final angiography confirmed an excellent result. The patient tolerated the procedure well. There were no immediate procedural complications. A TR band was used for radial hemostasis. The patient was transferred to the post catheterization recovery area for further monitoring.  PCI Data: Lesion 1 Vessel - LCx/Segment - mid Percent Stenosis (pre) 95 TIMI-flow 3 Stent 3.0x16 Promus DES Percent Stenosis (post) 0 TIMI-flow (post) 3  Lesion 2 Vessel - OM1/Segment - proximal Percent Stenosis (pre) 90 TIMI-flow 3 Stent 2.5x16 mm Promus DES Percent Stenosis (post) 0 TIMI-flow (post) 3  Lesion 3 Vessel - LAD/Segment - mid Percent Stenosis (pre) 75 TIMI-flow 3 Stent 3.5x20 mm Promus DES Percent Stenosis (post) 3 TIMI-flow (post) 0  Final Conclusions:  1. Severe 2 vessel coronary artery disease with severe stenosis of the mid left circumflex, first obtuse marginal, and mid LAD 2. Nonobstructive RCA stenosis 3. Preserved LV systolic function 4. Successful PCI of the LAD, left circumflex, and OM branches as outlined above Recommendations:  Aspirin and effient for a minimum of 12 months. [name] 08/05/2013, 6:37 PM   Surgical History:  Past Surgical History  Procedure Laterality Date  . Back surgery      x3  . Kidney stone surgery    .  Coronary angioplasty with stent placement  08/06/13    Promus DES to LCX, OM1, & LAD   . Left heart catheterization with coronary angiogram N/A 08/05/2013    Procedure: LEFT HEART CATHETERIZATION WITH CORONARY ANGIOGRAM;  Surgeon: Blane Ohara, MD;  Location: [facility] CATH LAB;  Service: Cardiovascular;  Laterality: N/A;     Home Meds: Prior to Admission medications   Medication Sig Start Date End Date Taking? Authorizing Provider  aspirin 81 MG chewable tablet Chew 1 tablet (81 mg total) by mouth daily. 08/06/13   Delfina Redwood, MD  atorvastatin (LIPITOR) 40 MG tablet Take 1 tablet (40 mg total) by mouth daily at 6 PM. 09/20/13   Isaiah Serge, NP  metoprolol tartrate (LOPRESSOR) 25 MG tablet Take 0.5 tablets (12.5 mg total) by mouth 2 (two) times daily. 08/23/13   Isaiah Serge, NP  nitroGLYCERIN (NITROSTAT) 0.4 MG SL tablet Place 1 tablet (0.4 mg total) under the tongue every 5 (five) minutes as needed for chest pain. 12/22/13   Lelon Perla, MD  omeprazole (PRILOSEC) 20 MG capsule Take 1 capsule (20 mg total) by mouth daily. 08/23/13   Isaiah Serge, NP  prasugrel (EFFIENT) 10 MG TABS tablet Take 1 tablet (10 mg total) by mouth daily. 09/06/13   Pixie Casino, MD  zolpidem (AMBIEN) 10 MG tablet Take 10 mg by mouth at bedtime.    Historical Provider, MD    Allergies:  Allergies  Allergen Reactions  . Percocet [Oxycodone-Acetaminophen]     Nausea     History   Social History  . Marital Status: Married    Spouse Name: N/A    Number of Children: N/A  . Years of Education: N/A   Occupational History  . Not on file.   Social History Main Topics  . Smoking status: Never Smoker   . Smokeless tobacco: Never Used  . Alcohol Use: 0.6 oz/week    1 Cans of beer per week     Comment: occ   . Drug Use: No  . Sexual Activity: Yes   Other Topics Concern  . Not on file   Social History Narrative     Family History  Problem Relation Age of Onset  . Coronary artery disease Mother     CABG  . CAD Sister   . Cancer Sister   . Alzheimer's disease Father   . Cancer Brother     Review of Systems: General: negative for chills, fever, night sweats or weight changes.  Cardiovascular: see above Dermatological: negative for rash Respiratory: he tends to get a dry cough this time of year but this is unchanged - no hemoptysis Urologic: negative for hematuria Abdominal: negative for nausea, vomiting, diarrhea, bright red blood per rectum, melena, or hematemesis Neurologic: negative for visual  changes, syncope, or dizziness All other systems reviewed and are otherwise negative except as noted above.  Labs:   Lab Results  Component Value Date   WBC 9.0 02/14/2014   HGB 15.5 02/14/2014   HCT 45.8 02/14/2014   MCV 84.0 02/14/2014   PLT 182 02/14/2014    Recent Labs Lab 02/14/14 1302  NA 141  K 4.2  CL 107  CO2 28  BUN 14  CREATININE 1.12  CALCIUM 9.3  GLUCOSE 77    Lab Results  Component Value Date   CHOL 80 09/14/2013   HDL 36* 09/14/2013   LDLCALC 31 09/14/2013   TRIG 66 09/14/2013   Troponin neg x 1  Radiology/Studies:  Dg Chest 2 View  02/14/2014   CLINICAL DATA:  Three-day history of left-sided chest pain  EXAM: CHEST  2 VIEW  COMPARISON:  August 04, 2013  FINDINGS: There is no edema or consolidation. The heart size and pulmonary vascularity are normal. No adenopathy. No bone lesions. There are stents in the left anterior descending and circumflex coronary arteries.  IMPRESSION: No edema or consolidation.   Electronically Signed   By: Lowella Grip M.D.   On: 02/14/2014 13:57   Wt Readings from Last 3 Encounters:  02/14/14 180 lb (81.647 kg)  09/06/13 192 lb 14.4 oz (87.499 kg)  08/23/13 195 lb 11.2 oz (88.769 kg)   EKG: sinus bradycardia 59bpm, minimal ST upsloped appearance in V4-V6  Physical Exam: Blood pressure 125/73, pulse 49, temperature 97.9 F (36.6 C), temperature source Oral, resp. rate 12, height 5\' 10"  (1.778 m), weight 180 lb (81.647 kg), SpO2 99 %. General: Well developed, well appearing calm WM in no acute distress. Head: Normocephalic, atraumatic, sclera non-icteric, no xanthomas, nares are without discharge.  Neck: Negative for carotid bruits. JVD not elevated. Lungs: Clear bilaterally to auscultation without wheezes, rales, or rhonchi. Breathing is unlabored. Heart: RRR with S1 S2. No murmurs, rubs, or gallops appreciated. Abdomen: Soft, non-tender, non-distended with normoactive bowel sounds. No hepatomegaly. No  rebound/guarding. No obvious abdominal masses. Msk:  Strength and tone appear normal for age. Chest wall TTP to left of sternum - patient says "that's the place" Extremities: No clubbing or cyanosis. No edema.  Distal pedal pulses are 2+ and equal bilaterally. Neuro: Alert and oriented X 3. No focal deficit. No facial asymmetry. Moves all extremities spontaneously. Psych:  Responds to questions appropriately with a normal affect.    ASSESSMENT AND PLAN:   1. Chest discomfort, atypical, suspect musculoskeletal 2. CAD with history of NSTEMI/PCI in 07/2013 as above 3. Hyperlipidemia, on statin 4. GERD, controlled on PPI  Mr. Marschall symptoms are atypical for cardiac pain. Pain is worse with palpation. It also increased slightly a few times with exertion but not every time. Despite symptoms for 3 days, troponin is negative. EKG with minimal nonspecific changes. Given his history we will admit him overnight and continue to cycle enzymes. Will try Toradol for chest wall discomfort. If he rules out and EKG remains unchanged, will plan discharge with outpatient followup. Otherwise continue home regimen. HR upper 40s-upper 50s in ER and tolerating this, thus will continue low dose BB. No symptoms to suggest symptomatic bradycardia. Will repeat lipids in AM.   Signed, Dayna Dunn PA-C 02/14/2014, 4:24 PM   Patient seen and examined. Agree with assessment and plan. Very pleasant 69 yr old WM who is s/p PCI to mid LCX, OM1/and mid LAD with DES stenting. The pain at that time was a pressure sensation.  Today he presents with a different chest pain, more sharp, and suggestive of musculoskeletal etiology. He had been walking his 60 lbs dog with a leash which he held in his left hand over the weekend. Pain is more localized and different than prior chest tightness and is in the left constochondral region. ECG is unremarkable. Hemodynamics are stable. Initial trop is negative. Pts wife is a Marine scientist and she was  very concerned with his 3 day history. He had concomitant CAD at cath.  Will cycle enzymes; keep overnight for observation and give iv toradol.  Probably can dc in am. Consider f/u myoview study as an outpatient 6 mo post multivessel PCI with concomitant CAD.  Troy Sine, MD, [facility] 02/14/2014 5:02 PM

## 2014-02-14 NOTE — ED Notes (Signed)
Pt reports 3 days left chest pressure.  Pt took NTG  x 2 today, no relief in chest pain.  Pt tok ASA today.  No other s/s noted.

## 2014-02-14 NOTE — Progress Notes (Signed)
Pt arrived to unit from ED.  VSS, CCMD notified.  Pt resting comfortably with call bell within reach. Claudette Stapler, RN

## 2014-02-14 NOTE — ED Provider Notes (Signed)
Care assumed from [facility], [place].  Ronald Berger is a 69 y.o. male presents with left aching chest pain for the last 3 days. Patient with CABG and three-vessel bypass on 08/05/2013.  Patient sees Dr. Debara Pickett.  He called the cardiology office and was advised to take a nitroglycerin which gave him no relief. Patient reports chest pain is similar when he had an MRI.   Physical Exam  BP 125/73 mmHg  Pulse 49  Temp(Src) 97.9 F (36.6 C) (Oral)  Resp 12  Ht 5\' 10"  (1.778 m)  Wt 180 lb (81.647 kg)  BMI 25.83 kg/m2  SpO2 99%  Physical Exam   Face to face Exam:   General: Awake  HEENT: Atraumatic  Resp: Normal effort  Abd: Nondistended  Neuro:No focal weakness  Lymph: No adenopathy    EKG Interpretation  Date/Time:  Tuesday February 14 2014 12:58:52 EST Ventricular Rate:  59 PR Interval:  184 QRS Duration: 86 QT Interval:  380 QTC Calculation: 376 R Axis:   73 Text Interpretation:  Sinus bradycardia Otherwise normal ECG Sinus bradycardia Artifact Abnormal ekg Confirmed by Carmin Muskrat  MD [phone]) on 02/14/2014 4:24:44 PM        ED Course  Procedures  MDM   Plan: Patient pending a cardiology assessment and disposition.  6:08 PM Cardiology has assessed and given his history will admit overnight and continue to cycle enzymes  BP 125/73 mmHg  Pulse 49  Temp(Src) 97.9 F (36.6 C) (Oral)  Resp 12  Ht 5\' 10"  (1.778 m)  Wt 180 lb (81.647 kg)  BMI 25.83 kg/m2  SpO2 99%       Abigail Butts, PA-C 02/14/14 1809  Quintella Reichert, MD 02/14/14 1905

## 2014-02-14 NOTE — Telephone Encounter (Signed)
SPOKE TO PATIENT PATIENT STATES HE HAS BEEN HAVING CHEST DISCOMFORT OFF /ON  FOR 3 DAYS, NO CHANGES WHEN USE NTG  PATIENT STATES PRESSURE RATE ABOUT 2 TO 3  - FOR ABOUT AN HOUR  NO BLOOD PRESSURE OBTAINED. NTG x1  Awaited 5 mins,- per patient no change in pressure. Patient states he took medication this morning. Discuss  With Dr Debara Pickett. Per Dr Debara Pickett, try NTG X 1 -if no relief , go to closest ER. Patient states he would if no relief wilL go to [place]

## 2014-02-15 ENCOUNTER — Encounter (HOSPITAL_COMMUNITY): Payer: Self-pay | Admitting: Physician Assistant

## 2014-02-15 LAB — LIPID PANEL
CHOL/HDL RATIO: 3.3 ratio
Cholesterol: 95 mg/dL (ref 0–200)
HDL: 29 mg/dL — AB (ref >39)
LDL Cholesterol: 40 mg/dL (ref 0–99)
TRIGLYCERIDES: 130 mg/dL (ref <150)
VLDL: 26 mg/dL (ref 0–40)

## 2014-02-15 LAB — CBC
HCT: 43.5 % (ref 39.0–52.0)
Hemoglobin: 14.8 g/dL (ref 13.0–17.0)
MCH: 28.5 pg (ref 26.0–34.0)
MCHC: 34 g/dL (ref 30.0–36.0)
MCV: 83.7 fL (ref 78.0–100.0)
PLATELETS: 174 10*3/uL (ref 150–400)
RBC: 5.2 MIL/uL (ref 4.22–5.81)
RDW: 12.9 % (ref 11.5–15.5)
WBC: 7.6 10*3/uL (ref 4.0–10.5)

## 2014-02-15 LAB — BASIC METABOLIC PANEL
Anion gap: 8 (ref 5–15)
BUN: 18 mg/dL (ref 6–23)
CALCIUM: 8.9 mg/dL (ref 8.4–10.5)
CHLORIDE: 107 mmol/L (ref 96–112)
CO2: 24 mmol/L (ref 19–32)
Creatinine, Ser: 1.07 mg/dL (ref 0.50–1.35)
GFR, EST AFRICAN AMERICAN: 82 mL/min — AB (ref >90)
GFR, EST NON AFRICAN AMERICAN: 70 mL/min — AB (ref >90)
Glucose, Bld: 103 mg/dL — ABNORMAL HIGH (ref 70–99)
Potassium: 4.1 mmol/L (ref 3.5–5.1)
SODIUM: 139 mmol/L (ref 135–145)

## 2014-02-15 LAB — TROPONIN I: Troponin I: <0.03 ng/mL (ref <0.031)

## 2014-02-15 MED ORDER — ATORVASTATIN CALCIUM 40 MG PO TABS: 40.0000 mg | ORAL_TABLET | Freq: Every evening | ORAL | Status: DC

## 2014-02-15 NOTE — Progress Notes (Signed)
Discharge instructions given. Pt verbalized understanding and all questions were answered.  

## 2014-02-15 NOTE — Discharge Summary (Signed)
Discharge Summary   Patient ID: Ronald Berger MRN: [id number], DOB/AGE: 67-18-1947 67 y.o. Admit date: 02/14/2014 D/C date:     02/15/2014  Primary Care Provider: Deloria Lair, MD Primary Cardiologist: [facility]  Primary Discharge Diagnoses:  1. Chest discomfort, atypical, suspect musculoskeletal 2. CAD with history of NSTEMI 07/2013 s/p DES to mLCx/OM1/mLAD with nonobstructive RCA disease, normal EF 3. Hyperlipidemia, on statin 4. GERD, controlled on PPI 5. Chronic sinus bradycardia 6. OSA, did not previously tolerate CPAP  Secondary Discharge Diagnoses:  1. H/o environmental allergies 2. H/o kidney stone 3. 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  He has done well since his PCI. He has been participating regularly in cardiac rehab without any recent exertional symptoms. He has been compliant with medication. For the past 3 days he developed sharp chest discomfort slightly to the left of his sternum. It does not radiate. He denies any associated SOB, nausea, vomiting, diaphoresis, palpitations, near-syncope, syncope or bleeding. The pain is not similar to his prior angina (that was more pressure-like and with associated sx). It is worse with palpation. It is not usually worse with exertion but has been on a few occasions. Pain is not worse with inspiration or recumbency. He took Tums and several SL NTG without any change in discomfort. The discomfort has tended to come on in the morning, last all day long, then resolve at night when lying down. He walks his dog 3-4 times a day and wonders if walking her in the snow (which was more difficult) could've led to some shoulder straining. No recent travel, surgery, bedrest, LEE, or orthopnea. His wife is a Marine scientist and urged  him to get checked out. In the ED, VSS except mild sinus bradycardia, not tachycardic, tachypneic or hypoxic. Normal CBC, BMET, troponin x 1. CXR no edema or consolidation. He was admitted for observation and treated with IV ketorolac with improvement in symptoms. He remained CP free. Troponins remained negative x 5 and labs remained stable. TSH 4.2. He did have some one episode of sinus bradycardia overnight in the low 40's while sleeping (asymptomatic). He does admit to a h/o OSA but has not been able to tolerate CPAP. He denies any dizziness, near-syncope, syncope or any symptoms of bradycardia. LDL remains at goal at 40. He is feeling good today. No further CP. Dr. Gwenlyn Found has seen and examined the patient today and feels he is stable for discharge. He was instructed to f/u PCP to reconsider different mask for OSA. I have sent a message to our [facility] office's scheduler requesting a follow-up appointment with Dr. Debara Pickett, and our office will call the patient with this information.    Vital Signs. BP 106/67 mmHg  Pulse 52  Temp(Src) 98.1 F (36.7 C) (Oral)  Resp 20  Ht 5\' 9"  (1.753 m)  Wt 182 lb 15.7 oz (83 kg)  BMI 27.01 kg/m2  SpO2 100% General: Well developed, well nourished, in no acute distress. Head: Normocephalic, atraumatic, sclera non-icteric, no xanthomas, nares are without discharge. Neck: Negative for carotid bruits. JVP not elevated. Lungs: Clear bilaterally to auscultation without wheezes, rales, or rhonchi. Breathing is unlabored. Heart: RRR S1 S2 without murmurs, rubs, or gallops.  Abdomen: Soft, non-tender, non-distended with normoactive bowel sounds. No rebound/guarding. Extremities: No  clubbing or cyanosis. No edema. Distal pedal pulses are 2+ and equal bilaterally. Neuro: Alert and oriented X 3. Moves all extremities spontaneously. Psych:  Responds to questions appropriately with a normal affect.  Labs: Lab Results  Component Value Date   WBC 7.6 02/15/2014   HGB 14.8  02/15/2014   HCT 43.5 02/15/2014   MCV 83.7 02/15/2014   PLT 174 02/15/2014    Recent Labs Lab 02/15/14 0730  NA 139  K 4.1  CL 107  CO2 24  BUN 18  CREATININE 1.07  CALCIUM 8.9  GLUCOSE 103*    Recent Labs  02/14/14 1628 02/14/14 2010 02/15/14 0154 02/15/14 0730  TROPONINI <0.03 <0.03 <0.03 <0.03   Lab Results  Component Value Date   CHOL 95 02/15/2014   HDL 29* 02/15/2014   LDLCALC 40 02/15/2014   TRIG 130 02/15/2014   Diagnostic Studies/Procedures   Dg Chest 2 View  02/14/2014   CLINICAL DATA:  Three-day history of left-sided chest pain  EXAM: CHEST  2 VIEW  COMPARISON:  August 04, 2013  FINDINGS: There is no edema or consolidation. The heart size and pulmonary vascularity are normal. No adenopathy. No bone lesions. There are stents in the left anterior descending and circumflex coronary arteries.  IMPRESSION: No edema or consolidation.   Electronically Signed   By: Lowella Grip M.D.   On: 02/14/2014 13:57    Discharge Medications     Medication List    TAKE these medications        aspirin 81 MG chewable tablet  Chew 1 tablet (81 mg total) by mouth daily.     atorvastatin 40 MG tablet  Commonly known as:  LIPITOR  Take 1 tablet (40 mg total) by mouth every evening.     metoprolol tartrate 25 MG tablet  Commonly known as:  LOPRESSOR  Take 0.5 tablets (12.5 mg total) by mouth 2 (two) times daily.     nitroGLYCERIN 0.4 MG SL tablet  Commonly known as:  NITROSTAT  Place 1 tablet (0.4 mg total) under the tongue every 5 (five) minutes as needed for chest pain.     omeprazole 20 MG capsule  Commonly known as:  PRILOSEC  Take 1 capsule (20 mg total) by mouth daily.     prasugrel 10 MG Tabs tablet  Commonly known as:  EFFIENT  Take 1 tablet (10 mg total) by mouth daily.     zolpidem 10 MG tablet  Commonly known as:  AMBIEN  Take 10 mg by mouth at bedtime.        . Disposition   The patient will be discharged in stable condition to  home. Discharge Instructions    Diet - low sodium heart healthy    Complete by:  As directed      Increase activity slowly    Complete by:  As directed           Follow-up Information    Follow up with Pixie Casino, MD.   Specialty:  Cardiology   Why:  Office will call you to verify your followup appointment. Call office if you have not heard back in 3 days.   Contact information:   [place] [address] [phone]         Duration of Discharge Encounter: Greater than 30 minutes including physician and PA time.  Signed, Lisbeth Renshaw Dunn PA-C 02/15/2014, 1:13 PM

## 2014-02-15 NOTE — Progress Notes (Signed)
UR completed 

## 2014-02-17 ENCOUNTER — Telehealth: Payer: Self-pay | Admitting: Internal Medicine

## 2014-02-20 ENCOUNTER — Encounter (HOSPITAL_COMMUNITY)
Admission: RE | Admit: 2014-02-20 | Discharge: 2014-02-20 | Disposition: A | Discharge status: Home / Self Care | Payer: Medicare HMO | Source: Ambulatory Visit | Attending: Internal Medicine | Admitting: Internal Medicine

## 2014-02-20 DIAGNOSIS — Z5189 Encounter for other specified aftercare: Secondary | ICD-10-CM | POA: Diagnosis not present

## 2014-02-20 DIAGNOSIS — Z955 Presence of coronary angioplasty implant and graft: Secondary | ICD-10-CM | POA: Insufficient documentation

## 2014-02-20 DIAGNOSIS — I252 Old myocardial infarction: Secondary | ICD-10-CM | POA: Diagnosis not present

## 2014-02-22 ENCOUNTER — Encounter (HOSPITAL_COMMUNITY)
Admission: RE | Admit: 2014-02-22 | Discharge: 2014-02-22 | Disposition: A | Discharge status: Home / Self Care | Payer: Medicare HMO | Source: Ambulatory Visit | Attending: Internal Medicine | Admitting: Internal Medicine

## 2014-02-22 DIAGNOSIS — Z5189 Encounter for other specified aftercare: Secondary | ICD-10-CM | POA: Diagnosis not present

## 2014-02-24 ENCOUNTER — Encounter (HOSPITAL_COMMUNITY)
Admission: RE | Admit: 2014-02-24 | Discharge: 2014-02-24 | Disposition: A | Discharge status: Home / Self Care | Payer: Medicare HMO | Source: Ambulatory Visit | Attending: Internal Medicine | Admitting: Internal Medicine

## 2014-02-24 DIAGNOSIS — Z5189 Encounter for other specified aftercare: Secondary | ICD-10-CM | POA: Diagnosis not present

## 2014-02-27 ENCOUNTER — Encounter (HOSPITAL_COMMUNITY)
Admission: RE | Admit: 2014-02-27 | Discharge: 2014-02-27 | Disposition: A | Discharge status: Home / Self Care | Payer: Medicare HMO | Source: Ambulatory Visit | Attending: Internal Medicine | Admitting: Internal Medicine

## 2014-02-27 DIAGNOSIS — Z5189 Encounter for other specified aftercare: Secondary | ICD-10-CM | POA: Diagnosis not present

## 2014-03-01 ENCOUNTER — Encounter (HOSPITAL_COMMUNITY)
Admission: RE | Admit: 2014-03-01 | Discharge: 2014-03-01 | Disposition: A | Discharge status: Home / Self Care | Payer: Medicare HMO | Source: Ambulatory Visit | Attending: Internal Medicine | Admitting: Internal Medicine

## 2014-03-01 DIAGNOSIS — Z5189 Encounter for other specified aftercare: Secondary | ICD-10-CM | POA: Diagnosis not present

## 2014-03-01 NOTE — Telephone Encounter (Signed)
Closed encounter °

## 2014-03-02 ENCOUNTER — Telehealth: Payer: Self-pay | Admitting: *Deleted

## 2014-03-02 NOTE — Telephone Encounter (Signed)
Faxed order for [facility] cardiac re[facility]  - MI, stent - maintenance referral program

## 2014-03-03 ENCOUNTER — Encounter (HOSPITAL_COMMUNITY)
Admission: RE | Admit: 2014-03-03 | Discharge: 2014-03-03 | Disposition: A | Discharge status: Home / Self Care | Payer: Medicare HMO | Source: Ambulatory Visit | Attending: Internal Medicine | Admitting: Internal Medicine

## 2014-03-03 DIAGNOSIS — Z5189 Encounter for other specified aftercare: Secondary | ICD-10-CM | POA: Diagnosis not present

## 2014-03-06 ENCOUNTER — Encounter (HOSPITAL_COMMUNITY): Payer: Medicare HMO

## 2014-03-08 ENCOUNTER — Encounter (HOSPITAL_COMMUNITY)
Admission: RE | Admit: 2014-03-08 | Discharge: 2014-03-08 | Disposition: A | Discharge status: Home / Self Care | Payer: Medicare HMO | Source: Ambulatory Visit | Attending: Internal Medicine | Admitting: Internal Medicine

## 2014-03-08 DIAGNOSIS — Z5189 Encounter for other specified aftercare: Secondary | ICD-10-CM | POA: Diagnosis not present

## 2014-03-09 NOTE — Progress Notes (Signed)
Cardiac Rehabilitation Program Outcomes Report   Orientation:  11/17/13 Graduate Date:  03/08/14 Discharge Date:  03/08/14 # of sessions completed: 36  Cardiologist: Dr. Debara Pickett Family MD:  Dr. Lorette Ang Time:  5456  A.  Exercise Program:  Tolerates exercise @ 3.53 METS for 30 minutes, Walk Test Results:  Pre: 1650 ft at 3.3 mets, Improved functional capacity  4.13 %, Improved  muscular strength  4.7 %, Improved  flexibility 5.17 %, Progressed to Phase 4 maintenance program and Discharged  B.  Mental Health:  Good mental attitude and Quality of Life (QOL)  improvements:  Overall  4.13 %, Health/Functioning 1.37 %, Socioeconomics 8.11 %, Psych/Spiritual 11.11 %, Family 0 %    C.  Education/Instruction/Skills  Accurately checks own pulse.  Rest:  48  Exercise:  109, Knows THR for exercise, Uses Perceived Exertion Scale and/or Dyspnea Scale and Attended 100% education classes  Uses Perceived Exertion Scale and/or Dyspnea Scale  D.  Nutrition/Weight Control/Body Composition:  Adherence to prescribed nutrition program: good    E.  Blood Lipids    Lab Results  Component Value Date   CHOL 95 02/15/2014   HDL 29* 02/15/2014   LDLCALC 40 02/15/2014   TRIG 130 02/15/2014   CHOLHDL 3.3 02/15/2014    F.  Lifestyle Changes:  Making positive lifestyle changes  G.  Symptoms noted with exercise:  Asymptomatic  Report Completed By:  Felicity Coyer, RN   Comments:  Graduation note.  Pt plans to join CR maintenance program.

## 2014-03-09 NOTE — Progress Notes (Signed)
Patient is discharged from [facility] program today, March 08, 2014 with 36 sessions.  He achieved LTG of 30 minutes of aerobic exercise at max met level of 3.53.  All patient vitals are WNL.  Patient has met with dietician.  Discharge instructions have been reviewed in detail and patient expressed an understanding of material given.  Patient plans to join the maintenance program. Cardiac Rehab will make 1 month, 6 month and 1 year call backs.  Patient had no complaints of any abnormal S/S or pain on their exit visit.

## 2014-03-10 ENCOUNTER — Encounter (HOSPITAL_COMMUNITY)
Admission: RE | Admit: 2014-03-10 | Discharge: 2014-03-10 | Disposition: A | Discharge status: Home / Self Care | Payer: Self-pay | Source: Ambulatory Visit | Attending: Internal Medicine | Admitting: Internal Medicine

## 2014-03-10 DIAGNOSIS — Z955 Presence of coronary angioplasty implant and graft: Secondary | ICD-10-CM | POA: Insufficient documentation

## 2014-03-10 DIAGNOSIS — I252 Old myocardial infarction: Secondary | ICD-10-CM | POA: Insufficient documentation

## 2014-03-13 ENCOUNTER — Encounter (HOSPITAL_COMMUNITY)
Admission: RE | Admit: 2014-03-13 | Discharge: 2014-03-13 | Disposition: A | Discharge status: Home / Self Care | Payer: Self-pay | Source: Ambulatory Visit | Attending: Internal Medicine | Admitting: Internal Medicine

## 2014-03-15 ENCOUNTER — Encounter (HOSPITAL_COMMUNITY)
Admission: RE | Admit: 2014-03-15 | Discharge: 2014-03-15 | Disposition: A | Discharge status: Home / Self Care | Payer: Self-pay | Source: Ambulatory Visit | Attending: Internal Medicine | Admitting: Internal Medicine

## 2014-03-17 ENCOUNTER — Encounter (HOSPITAL_COMMUNITY)
Admission: RE | Admit: 2014-03-17 | Discharge: 2014-03-17 | Disposition: A | Discharge status: Home / Self Care | Payer: Self-pay | Source: Ambulatory Visit | Attending: Internal Medicine | Admitting: Internal Medicine

## 2014-03-20 ENCOUNTER — Encounter (HOSPITAL_COMMUNITY)
Admission: RE | Admit: 2014-03-20 | Discharge: 2014-03-20 | Disposition: A | Discharge status: Home / Self Care | Payer: Self-pay | Source: Ambulatory Visit | Attending: Internal Medicine | Admitting: Internal Medicine

## 2014-03-22 ENCOUNTER — Encounter (HOSPITAL_COMMUNITY)
Admission: RE | Admit: 2014-03-22 | Discharge: 2014-03-22 | Disposition: A | Discharge status: Home / Self Care | Payer: Self-pay | Source: Ambulatory Visit | Attending: Internal Medicine | Admitting: Internal Medicine

## 2014-03-22 DIAGNOSIS — Z955 Presence of coronary angioplasty implant and graft: Secondary | ICD-10-CM | POA: Insufficient documentation

## 2014-03-22 DIAGNOSIS — I252 Old myocardial infarction: Secondary | ICD-10-CM | POA: Insufficient documentation

## 2014-03-24 ENCOUNTER — Encounter (HOSPITAL_COMMUNITY)
Admission: RE | Admit: 2014-03-24 | Discharge: 2014-03-24 | Disposition: A | Discharge status: Home / Self Care | Payer: Self-pay | Source: Ambulatory Visit | Attending: Internal Medicine | Admitting: Internal Medicine

## 2014-03-27 ENCOUNTER — Encounter (HOSPITAL_COMMUNITY)
Admission: RE | Admit: 2014-03-27 | Discharge: 2014-03-27 | Disposition: A | Discharge status: Home / Self Care | Payer: Self-pay | Source: Ambulatory Visit | Attending: Internal Medicine | Admitting: Internal Medicine

## 2014-03-29 ENCOUNTER — Encounter (HOSPITAL_COMMUNITY): Payer: Self-pay

## 2014-03-31 ENCOUNTER — Encounter: Payer: Self-pay | Admitting: Internal Medicine

## 2014-03-31 ENCOUNTER — Encounter (HOSPITAL_COMMUNITY)
Admission: RE | Admit: 2014-03-31 | Discharge: 2014-03-31 | Disposition: A | Discharge status: Home / Self Care | Payer: Self-pay | Source: Ambulatory Visit | Attending: Internal Medicine | Admitting: Internal Medicine

## 2014-03-31 ENCOUNTER — Ambulatory Visit (INDEPENDENT_AMBULATORY_CARE_PROVIDER_SITE_OTHER): Payer: Medicare HMO | Admitting: Internal Medicine

## 2014-03-31 VITALS — BP 122/70 | HR 50 | Ht 69.0 in | Wt 186.7 lb

## 2014-03-31 DIAGNOSIS — I2510 Atherosclerotic heart disease of native coronary artery without angina pectoris: Secondary | ICD-10-CM | POA: Diagnosis not present

## 2014-03-31 DIAGNOSIS — I214 Non-ST elevation (NSTEMI) myocardial infarction: Secondary | ICD-10-CM

## 2014-03-31 DIAGNOSIS — E785 Hyperlipidemia, unspecified: Secondary | ICD-10-CM

## 2014-03-31 NOTE — Progress Notes (Signed)
[facility]    OFFICE NOTE  Chief Complaint:  Hospital follow-up  Primary Care Physician: Deloria Lair, MD  HPI:  Ronald Berger is a 69 y/o M with history of CAD (NSTEMI 07/2013 s/p DES to mLCx/OM1/mLAD with nonobstructive RCA disease, normal EF), HLD, and GERD. He has done well since his PCI. He has been participating regularly in cardiac rehab without any recent exertional symptoms. He has been compliant with medication. He was recently seen in the ER for sharp chest discomfort slightly to the left of his sternum. It does not radiate. He denies any associated SOB, nausea, vomiting, diaphoresis, palpitations, near-syncope, syncope or bleeding. The pain is not similar to his prior angina (that was more pressure-like and with associated sx). It is worse with palpation. It is not usually worse with exertion but has been on a few occasions. Pain is not worse with inspiration or recumbency. He took Tums and several SL NTG without any change in discomfort. The discomfort has tended to come on in the morning, last all day long, then resolve at night when lying down. He walks his dog 3-4 times a day and wonders if walking her in the snow (which was more difficult) could've led to some shoulder straining. No recent travel, surgery, bedrest, LEE, or orthopnea. His wife is a Marine scientist and urged him to get checked out. In the ED, VSS except mild sinus bradycardia, not tachycardic, tachypneic or hypoxic. Normal CBC, BMET, troponin x 1. CXR no edema or consolidation. He was admitted for observation and treated with IV ketorolac with improvement in symptoms. He remained CP free. Troponins remained negative x 5 and labs remained stable. TSH 4.2. He did have some one episode of sinus bradycardia overnight in the low 40's while sleeping (asymptomatic). He does admit to a h/o OSA but has not been able to tolerate CPAP. He denies any dizziness, near-syncope, syncope or any symptoms of bradycardia. LDL remains at goal at 40.  Ronald Berger returns today for follow-up. He denies any chest pain or worsening shortness of breath. He is resume cardiac rehabilitation and doing well. He will need to continue on pressor Grell at least until this summer.  PMHx:  Past Medical History  Diagnosis Date  . Myocardial infarction 08/02/13  . Environmental allergies   . Kidney stone   . Transfusion history     autologous transfusion during back surgery  . 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 NSTEMI 07/2013 - DES to mLCx, DES to prox OM1, DES to mLAD with nonobstructive RCA stenosis, EF 55-65%.  . Hyperlipidemia LDL goal <70   . GERD (gastroesophageal reflux disease)   . Sinus bradycardia   . 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  . OSA (obstructive sleep apnea)     Past Surgical History  Procedure Laterality Date  . Back surgery      x3  . Kidney stone surgery    . Coronary angioplasty with stent placement  08/06/13    Promus DES to LCX, OM1, & LAD   . Left heart catheterization with coronary angiogram N/A 08/05/2013    Procedure: LEFT HEART CATHETERIZATION WITH CORONARY ANGIOGRAM;  Surgeon: Blane Ohara, MD;  Location: [facility] CATH LAB;  Service: Cardiovascular;  Laterality: N/A;  . Shoulder surgery Right     FAMHx:  Family History  Problem Relation Age of Onset  . Coronary artery disease  Mother     CABG  . CAD Sister   . Cancer Sister   . Alzheimer's disease Father   . Cancer Brother     SOCHx:   reports that he has never smoked. He has never used smokeless tobacco. He reports that he drinks about 0.6 oz of alcohol per week. He reports that he does not use illicit drugs.  ALLERGIES:  Allergies  Allergen Reactions  . Percocet [Oxycodone-Acetaminophen]     Nausea     ROS: A comprehensive review of systems was negative.  HOME MEDS: Current Outpatient Prescriptions  Medication Sig Dispense Refill  . aspirin 81 MG chewable tablet  Chew 1 tablet (81 mg total) by mouth daily.    [facility] atorvastatin (LIPITOR) 40 MG tablet Take 1 tablet (40 mg total) by mouth every evening.    . metoprolol tartrate (LOPRESSOR) 25 MG tablet Take 0.5 tablets (12.5 mg total) by mouth 2 (two) times daily. 90 tablet 3  . nitroGLYCERIN (NITROSTAT) 0.4 MG SL tablet Place 1 tablet (0.4 mg total) under the tongue every 5 (five) minutes as needed for chest pain. 25 tablet 12  . omeprazole (PRILOSEC) 20 MG capsule Take 1 capsule (20 mg total) by mouth daily. 90 capsule 3  . prasugrel (EFFIENT) 10 MG TABS tablet Take 1 tablet (10 mg total) by mouth daily. 35 tablet 0  . zolpidem (AMBIEN) 10 MG tablet Take 10 mg by mouth at bedtime.     No current facility-administered medications for this visit.    LABS/IMAGING: No results found for this or any previous visit (from the past 48 hour(s)). No results found.  VITALS: BP 122/70 mmHg  Pulse 50  Ht 5\' 9"  (1.753 m)  Wt 186 lb 11.2 oz (84.687 kg)  BMI 27.56 kg/m2  EXAM: General appearance: alert and no distress Neck: no carotid bruit and no JVD Lungs: clear to auscultation bilaterally Heart: regular rate and rhythm, S1, S2 normal, no murmur, click, rub or gallop Abdomen: soft, non-tender; bowel sounds normal; no masses,  no organomegaly Extremities: extremities normal, atraumatic, no cyanosis or edema Pulses: 2+ and symmetric Skin: Skin color, texture, turgor normal. No rashes or lesions Neurologic: Grossly normal PSych: Pleasant  EKG: Sinus bradycardia 50  ASSESSMENT: 1. Coronary artery disease with NSTEMI (PCI to mLCX, OM1 and mLAD-07/2013) 2. Dyslipidemia 3. GERD  PLAN: 1.   Ronald Berger sees to be doing well and had what was atypical chest pain. He continues in cardiac rehabilitation maintenance per his request. I recommend that we continue him on dual antiplatelet therapy at least until this summer in July, at which time we could consider either coming off of Brilinta or switching him to  Plavix. Cholesterol was recently assessed at the hospital and is at goal with LDL of 40. Reflux is not bothersome on Prilosec. Plan to see him back in a few months.  Pixie Casino, MD, [facility] Attending Cardiologist [facility]  Adore Kithcart C 03/31/2014, 6:27 PM

## 2014-03-31 NOTE — Patient Instructions (Signed)
Please follow up with Dr. Debara Pickett in July 2016.

## 2014-04-03 ENCOUNTER — Encounter (HOSPITAL_COMMUNITY)
Admission: RE | Admit: 2014-04-03 | Discharge: 2014-04-03 | Disposition: A | Discharge status: Home / Self Care | Payer: Self-pay | Source: Ambulatory Visit | Attending: Internal Medicine | Admitting: Internal Medicine

## 2014-04-05 ENCOUNTER — Encounter (HOSPITAL_COMMUNITY)
Admission: RE | Admit: 2014-04-05 | Discharge: 2014-04-05 | Disposition: A | Discharge status: Home / Self Care | Payer: Self-pay | Source: Ambulatory Visit | Attending: Internal Medicine | Admitting: Internal Medicine

## 2014-04-07 ENCOUNTER — Encounter (HOSPITAL_COMMUNITY)
Admission: RE | Admit: 2014-04-07 | Discharge: 2014-04-07 | Disposition: A | Discharge status: Home / Self Care | Payer: Self-pay | Source: Ambulatory Visit | Attending: Internal Medicine | Admitting: Internal Medicine

## 2014-04-10 ENCOUNTER — Encounter (HOSPITAL_COMMUNITY): Payer: Self-pay

## 2014-04-12 ENCOUNTER — Encounter (HOSPITAL_COMMUNITY): Payer: Self-pay

## 2014-04-14 ENCOUNTER — Encounter (HOSPITAL_COMMUNITY): Payer: Self-pay

## 2014-04-17 ENCOUNTER — Encounter (HOSPITAL_COMMUNITY): Payer: Self-pay

## 2014-04-19 ENCOUNTER — Encounter (HOSPITAL_COMMUNITY): Payer: Self-pay

## 2014-04-21 ENCOUNTER — Encounter (HOSPITAL_COMMUNITY): Payer: Self-pay

## 2014-04-24 ENCOUNTER — Encounter (HOSPITAL_COMMUNITY)
Admission: RE | Admit: 2014-04-24 | Discharge: 2014-04-24 | Disposition: A | Discharge status: Home / Self Care | Payer: Self-pay | Source: Ambulatory Visit | Attending: Internal Medicine | Admitting: Internal Medicine

## 2014-04-24 DIAGNOSIS — I252 Old myocardial infarction: Secondary | ICD-10-CM | POA: Insufficient documentation

## 2014-04-24 DIAGNOSIS — Z955 Presence of coronary angioplasty implant and graft: Secondary | ICD-10-CM | POA: Insufficient documentation

## 2014-04-26 ENCOUNTER — Encounter (HOSPITAL_COMMUNITY)
Admission: RE | Admit: 2014-04-26 | Discharge: 2014-04-26 | Disposition: A | Discharge status: Home / Self Care | Payer: Self-pay | Source: Ambulatory Visit | Attending: Internal Medicine | Admitting: Internal Medicine

## 2014-04-28 ENCOUNTER — Encounter (HOSPITAL_COMMUNITY)
Admission: RE | Admit: 2014-04-28 | Discharge: 2014-04-28 | Disposition: A | Discharge status: Home / Self Care | Payer: Self-pay | Source: Ambulatory Visit | Attending: Internal Medicine | Admitting: Internal Medicine

## 2014-05-01 ENCOUNTER — Encounter (HOSPITAL_COMMUNITY)
Admission: RE | Admit: 2014-05-01 | Discharge: 2014-05-01 | Disposition: A | Discharge status: Home / Self Care | Payer: Self-pay | Source: Ambulatory Visit | Attending: Internal Medicine | Admitting: Internal Medicine

## 2014-05-03 ENCOUNTER — Encounter (HOSPITAL_COMMUNITY)
Admission: RE | Admit: 2014-05-03 | Discharge: 2014-05-03 | Disposition: A | Discharge status: Home / Self Care | Payer: Self-pay | Source: Ambulatory Visit | Attending: Internal Medicine | Admitting: Internal Medicine

## 2014-05-05 ENCOUNTER — Encounter (HOSPITAL_COMMUNITY)
Admission: RE | Admit: 2014-05-05 | Discharge: 2014-05-05 | Disposition: A | Discharge status: Home / Self Care | Payer: Self-pay | Source: Ambulatory Visit | Attending: Internal Medicine | Admitting: Internal Medicine

## 2014-05-05 ENCOUNTER — Telehealth: Payer: Self-pay | Admitting: Internal Medicine

## 2014-05-08 ENCOUNTER — Encounter (HOSPITAL_COMMUNITY)
Admission: RE | Admit: 2014-05-08 | Discharge: 2014-05-08 | Disposition: A | Discharge status: Home / Self Care | Payer: Self-pay | Source: Ambulatory Visit | Attending: Internal Medicine | Admitting: Internal Medicine

## 2014-05-08 NOTE — Telephone Encounter (Signed)
Close encounter 

## 2014-05-10 ENCOUNTER — Encounter (HOSPITAL_COMMUNITY)
Admission: RE | Admit: 2014-05-10 | Discharge: 2014-05-10 | Disposition: A | Discharge status: Home / Self Care | Payer: Self-pay | Source: Ambulatory Visit | Attending: Internal Medicine | Admitting: Internal Medicine

## 2014-05-12 ENCOUNTER — Encounter (HOSPITAL_COMMUNITY): Payer: Self-pay

## 2014-05-15 ENCOUNTER — Encounter (HOSPITAL_COMMUNITY)
Admission: RE | Admit: 2014-05-15 | Discharge: 2014-05-15 | Disposition: A | Discharge status: Home / Self Care | Payer: Self-pay | Source: Ambulatory Visit | Attending: Internal Medicine | Admitting: Internal Medicine

## 2014-05-17 ENCOUNTER — Encounter (HOSPITAL_COMMUNITY)
Admission: RE | Admit: 2014-05-17 | Discharge: 2014-05-17 | Disposition: A | Discharge status: Home / Self Care | Payer: Self-pay | Source: Ambulatory Visit | Attending: Internal Medicine | Admitting: Internal Medicine

## 2014-05-19 ENCOUNTER — Encounter (HOSPITAL_COMMUNITY)
Admission: RE | Admit: 2014-05-19 | Discharge: 2014-05-19 | Disposition: A | Discharge status: Home / Self Care | Payer: Self-pay | Source: Ambulatory Visit | Attending: Internal Medicine | Admitting: Internal Medicine

## 2014-05-22 ENCOUNTER — Encounter (HOSPITAL_COMMUNITY)
Admission: RE | Admit: 2014-05-22 | Discharge: 2014-05-22 | Disposition: A | Discharge status: Home / Self Care | Payer: Self-pay | Source: Ambulatory Visit | Attending: Internal Medicine | Admitting: Internal Medicine

## 2014-05-22 DIAGNOSIS — Z955 Presence of coronary angioplasty implant and graft: Secondary | ICD-10-CM | POA: Insufficient documentation

## 2014-05-22 DIAGNOSIS — I252 Old myocardial infarction: Secondary | ICD-10-CM | POA: Insufficient documentation

## 2014-05-24 ENCOUNTER — Encounter (HOSPITAL_COMMUNITY)
Admission: RE | Admit: 2014-05-24 | Discharge: 2014-05-24 | Disposition: A | Discharge status: Home / Self Care | Payer: Self-pay | Source: Ambulatory Visit | Attending: Internal Medicine | Admitting: Internal Medicine

## 2014-05-26 ENCOUNTER — Encounter (HOSPITAL_COMMUNITY): Payer: Self-pay

## 2014-05-29 ENCOUNTER — Encounter (HOSPITAL_COMMUNITY)
Admission: RE | Admit: 2014-05-29 | Discharge: 2014-05-29 | Disposition: A | Discharge status: Home / Self Care | Payer: Self-pay | Source: Ambulatory Visit | Attending: Internal Medicine | Admitting: Internal Medicine

## 2014-05-31 ENCOUNTER — Encounter (HOSPITAL_COMMUNITY): Payer: Self-pay

## 2014-06-02 ENCOUNTER — Encounter (HOSPITAL_COMMUNITY)
Admission: RE | Admit: 2014-06-02 | Discharge: 2014-06-02 | Disposition: A | Discharge status: Home / Self Care | Payer: Self-pay | Source: Ambulatory Visit | Attending: Internal Medicine | Admitting: Internal Medicine

## 2014-06-05 ENCOUNTER — Encounter (HOSPITAL_COMMUNITY)
Admission: RE | Admit: 2014-06-05 | Discharge: 2014-06-05 | Disposition: A | Discharge status: Home / Self Care | Payer: Self-pay | Source: Ambulatory Visit | Attending: Internal Medicine | Admitting: Internal Medicine

## 2014-06-07 ENCOUNTER — Encounter (HOSPITAL_COMMUNITY)
Admission: RE | Admit: 2014-06-07 | Discharge: 2014-06-07 | Disposition: A | Discharge status: Home / Self Care | Payer: Self-pay | Source: Ambulatory Visit | Attending: Internal Medicine | Admitting: Internal Medicine

## 2014-06-09 ENCOUNTER — Encounter (HOSPITAL_COMMUNITY)
Admission: RE | Admit: 2014-06-09 | Discharge: 2014-06-09 | Disposition: A | Discharge status: Home / Self Care | Payer: Self-pay | Source: Ambulatory Visit | Attending: Internal Medicine | Admitting: Internal Medicine

## 2014-06-12 ENCOUNTER — Encounter (HOSPITAL_COMMUNITY)
Admission: RE | Admit: 2014-06-12 | Discharge: 2014-06-12 | Disposition: A | Discharge status: Home / Self Care | Payer: Self-pay | Source: Ambulatory Visit | Attending: Internal Medicine | Admitting: Internal Medicine

## 2014-06-14 ENCOUNTER — Encounter (HOSPITAL_COMMUNITY)
Admission: RE | Admit: 2014-06-14 | Discharge: 2014-06-14 | Disposition: A | Discharge status: Home / Self Care | Payer: Self-pay | Source: Ambulatory Visit | Attending: Internal Medicine | Admitting: Internal Medicine

## 2014-06-16 ENCOUNTER — Encounter (HOSPITAL_COMMUNITY): Payer: Self-pay

## 2014-06-19 ENCOUNTER — Encounter (HOSPITAL_COMMUNITY): Payer: Self-pay

## 2014-06-21 ENCOUNTER — Encounter (HOSPITAL_COMMUNITY)
Admission: RE | Admit: 2014-06-21 | Discharge: 2014-06-21 | Disposition: A | Discharge status: Home / Self Care | Payer: Self-pay | Source: Ambulatory Visit | Attending: Internal Medicine | Admitting: Internal Medicine

## 2014-06-21 DIAGNOSIS — I252 Old myocardial infarction: Secondary | ICD-10-CM | POA: Insufficient documentation

## 2014-06-21 DIAGNOSIS — Z955 Presence of coronary angioplasty implant and graft: Secondary | ICD-10-CM | POA: Insufficient documentation

## 2014-06-22 ENCOUNTER — Other Ambulatory Visit: Payer: Self-pay | Admitting: Cardiology

## 2014-06-22 NOTE — Telephone Encounter (Signed)
Rx has been sent to the pharmacy electronically. ° °

## 2014-06-23 ENCOUNTER — Encounter (HOSPITAL_COMMUNITY)
Admission: RE | Admit: 2014-06-23 | Discharge: 2014-06-23 | Disposition: A | Discharge status: Home / Self Care | Payer: Self-pay | Source: Ambulatory Visit | Attending: Internal Medicine | Admitting: Internal Medicine

## 2014-06-23 ENCOUNTER — Telehealth: Payer: Self-pay | Admitting: Internal Medicine

## 2014-06-23 MED ORDER — ATORVASTATIN CALCIUM 40 MG PO TABS: 40.0000 mg | ORAL_TABLET | Freq: Every evening | ORAL | Status: DC

## 2014-06-23 MED ORDER — PRASUGREL HCL 10 MG PO TABS: 10.0000 mg | ORAL_TABLET | Freq: Every day | ORAL | Status: DC

## 2014-06-23 NOTE — Telephone Encounter (Signed)
Refill submitted to patient's preferred pharmacy. Informed patient. Pt voiced understanding, no other stated concerns at this time.  Also informed/reminded of f/u appt for 7/26.

## 2014-06-23 NOTE — Telephone Encounter (Signed)
°  1. Which medications need to be refilled? Effient and Lipitor-new prescriptions  2. Which pharmacy is medication to be sent to?[facility]  3. Do they need a 30 day or 90 day supply? 90 and refills  4. Would they like a call back once the medication has been sent to the pharmacy? yes

## 2014-06-26 ENCOUNTER — Encounter (HOSPITAL_COMMUNITY)
Admission: RE | Admit: 2014-06-26 | Discharge: 2014-06-26 | Disposition: A | Discharge status: Home / Self Care | Payer: Self-pay | Source: Ambulatory Visit | Attending: Internal Medicine | Admitting: Internal Medicine

## 2014-06-28 ENCOUNTER — Encounter (HOSPITAL_COMMUNITY)
Admission: RE | Admit: 2014-06-28 | Discharge: 2014-06-28 | Disposition: A | Discharge status: Home / Self Care | Payer: Self-pay | Source: Ambulatory Visit | Attending: Internal Medicine | Admitting: Internal Medicine

## 2014-06-30 ENCOUNTER — Encounter (HOSPITAL_COMMUNITY)
Admission: RE | Admit: 2014-06-30 | Discharge: 2014-06-30 | Disposition: A | Discharge status: Home / Self Care | Payer: Self-pay | Source: Ambulatory Visit | Attending: Internal Medicine | Admitting: Internal Medicine

## 2014-07-03 ENCOUNTER — Encounter (HOSPITAL_COMMUNITY)
Admission: RE | Admit: 2014-07-03 | Discharge: 2014-07-03 | Disposition: A | Discharge status: Home / Self Care | Payer: Self-pay | Source: Ambulatory Visit | Attending: Internal Medicine | Admitting: Internal Medicine

## 2014-07-05 ENCOUNTER — Encounter (HOSPITAL_COMMUNITY)
Admission: RE | Admit: 2014-07-05 | Discharge: 2014-07-05 | Disposition: A | Discharge status: Home / Self Care | Payer: Self-pay | Source: Ambulatory Visit | Attending: Internal Medicine | Admitting: Internal Medicine

## 2014-07-07 ENCOUNTER — Encounter (HOSPITAL_COMMUNITY): Payer: Self-pay

## 2014-07-10 ENCOUNTER — Encounter (HOSPITAL_COMMUNITY)
Admission: RE | Admit: 2014-07-10 | Discharge: 2014-07-10 | Disposition: A | Discharge status: Home / Self Care | Payer: Self-pay | Source: Ambulatory Visit | Attending: Internal Medicine | Admitting: Internal Medicine

## 2014-07-12 ENCOUNTER — Encounter (HOSPITAL_COMMUNITY)
Admission: RE | Admit: 2014-07-12 | Discharge: 2014-07-12 | Disposition: A | Discharge status: Home / Self Care | Payer: Self-pay | Source: Ambulatory Visit | Attending: Internal Medicine | Admitting: Internal Medicine

## 2014-07-14 ENCOUNTER — Encounter (HOSPITAL_COMMUNITY)
Admission: RE | Admit: 2014-07-14 | Discharge: 2014-07-14 | Disposition: A | Discharge status: Home / Self Care | Payer: Self-pay | Source: Ambulatory Visit | Attending: Internal Medicine | Admitting: Internal Medicine

## 2014-07-17 ENCOUNTER — Encounter (HOSPITAL_COMMUNITY)
Admission: RE | Admit: 2014-07-17 | Discharge: 2014-07-17 | Disposition: A | Discharge status: Home / Self Care | Payer: Self-pay | Source: Ambulatory Visit | Attending: Internal Medicine | Admitting: Internal Medicine

## 2014-07-19 ENCOUNTER — Encounter (HOSPITAL_COMMUNITY)
Admission: RE | Admit: 2014-07-19 | Discharge: 2014-07-19 | Disposition: A | Discharge status: Home / Self Care | Payer: Self-pay | Source: Ambulatory Visit | Attending: Internal Medicine | Admitting: Internal Medicine

## 2014-07-21 ENCOUNTER — Encounter (HOSPITAL_COMMUNITY): Payer: Self-pay

## 2014-07-24 ENCOUNTER — Encounter (HOSPITAL_COMMUNITY): Payer: Self-pay

## 2014-07-26 ENCOUNTER — Encounter (HOSPITAL_COMMUNITY): Payer: Self-pay

## 2014-07-28 ENCOUNTER — Encounter (HOSPITAL_COMMUNITY): Payer: Self-pay

## 2014-07-28 ENCOUNTER — Telehealth: Payer: Self-pay | Admitting: Internal Medicine

## 2014-07-28 DIAGNOSIS — R002 Palpitations: Secondary | ICD-10-CM

## 2014-07-28 NOTE — Telephone Encounter (Signed)
Please call,pt says he have been having some atrial fib,also a little dizzy when he gets up.

## 2014-07-28 NOTE — Telephone Encounter (Signed)
Agree with recommendations  Peter Jordan MD, FACC  

## 2014-07-28 NOTE — Telephone Encounter (Signed)
Patient of Dr. Debara Pickett.  He notes last day or so, he noticed heart palpitations. Feels irregular. Wife who is RN listened to his heart, noted it sounded irregular. Usually HR runs in 50s - he does not give indication that it is running faster or slower, just notes it feels/sounds irregular.  Has CAD hx. He does not note a history of A Fib. Does state he was working in yard yesterday and thinks may have been overdone it a little.  Denies any chest pain, SOB, fatigue.  Recently upon standing, he gets lightheaded briefly. This is new. He notes his BP typically runs on the low side (95-100/55-60) but usually not symptomatic.  Spoke w/ Roderic Palau at [facility] - she suggests EKG check, BP & HR.  Patient enrolled in cardiac rehab at [facility]. If this plan advisable, will see if he can go there to get checked rather than have to drive to [place]. Will defer to Dr. Martinique ([facility]) for further recommendations.

## 2014-07-28 NOTE — Telephone Encounter (Signed)
That sounds fine. Thanks.  Dr. Lemmie Evens

## 2014-07-28 NOTE — Addendum Note (Signed)
Addended by: Theodore Demark on: 07/28/2014 12:32 PM   Modules accepted: Orders

## 2014-07-28 NOTE — Telephone Encounter (Signed)
Ronald Berger at [facility] confirmed receipt of EKG order.

## 2014-07-28 NOTE — Telephone Encounter (Signed)
Called [facility]. Cardiac Rehab put me in touch w/ Respiratory dept who do the EKGs for outpatient. Spoke w/ them, they requested order to be faxed. Faxed EKG order w/ note requesting BP also, and notification when performed - if no findings may need to put pt on event monitor - will defer to Dr. Debara Pickett for next steps further out. Spoke w/ patient - indicated he was agreeable to plan - would like to have EKG - if further intervention needed (event monitor), will set this up if contacted today, or next week. In the meantime, instructed patient to call for any new concerns. Pt voiced understanding.

## 2014-07-28 NOTE — Telephone Encounter (Signed)
Pt called back - informed me Ronald Berger would not be able to schedule him until week after next. Cannot get to [facility] today.  Advised him to come into our office Monday - will perform EKG check then, BP/orthostatics, event monitor if needed. Pt agreeable - added to calendar for Monday nurse schedule.

## 2014-07-31 ENCOUNTER — Encounter (HOSPITAL_COMMUNITY): Payer: Self-pay

## 2014-07-31 ENCOUNTER — Ambulatory Visit (INDEPENDENT_AMBULATORY_CARE_PROVIDER_SITE_OTHER): Payer: Medicare HMO | Admitting: *Deleted

## 2014-07-31 VITALS — BP 108/68 | HR 60

## 2014-07-31 DIAGNOSIS — R002 Palpitations: Secondary | ICD-10-CM

## 2014-07-31 NOTE — Patient Instructions (Signed)
Follow up with Dr. Debara Pickett at next appointment.

## 2014-07-31 NOTE — Progress Notes (Signed)
Patient came in for complaints listed in last telephone note. States no problems over weekend, issues of palpitations and fatigue resolved. Conditional monitor placement, pt wanted to wait since no problems within past 5 days - if problems recur he will notify.   Pt elected to do EKG & orthostatic BPs this visit.   Orthostatic change negative (see documented vitals) EKG interpreted and signed by Dr. Claiborne Billings.

## 2014-08-02 ENCOUNTER — Encounter (HOSPITAL_COMMUNITY): Payer: Self-pay

## 2014-08-04 ENCOUNTER — Encounter (HOSPITAL_COMMUNITY): Payer: Self-pay

## 2014-08-07 ENCOUNTER — Encounter (HOSPITAL_COMMUNITY): Payer: Self-pay

## 2014-08-09 ENCOUNTER — Encounter (HOSPITAL_COMMUNITY): Payer: Self-pay

## 2014-08-09 ENCOUNTER — Encounter (HOSPITAL_COMMUNITY): Payer: Medicare HMO

## 2014-08-11 ENCOUNTER — Encounter (HOSPITAL_COMMUNITY): Payer: Self-pay

## 2014-08-14 ENCOUNTER — Encounter (HOSPITAL_COMMUNITY): Payer: Self-pay

## 2014-08-15 ENCOUNTER — Encounter: Payer: Self-pay | Admitting: Internal Medicine

## 2014-08-15 ENCOUNTER — Ambulatory Visit (INDEPENDENT_AMBULATORY_CARE_PROVIDER_SITE_OTHER): Payer: Medicare HMO | Admitting: Internal Medicine

## 2014-08-15 VITALS — BP 130/62 | HR 56 | Ht 64.0 in | Wt 185.3 lb

## 2014-08-15 DIAGNOSIS — I214 Non-ST elevation (NSTEMI) myocardial infarction: Secondary | ICD-10-CM | POA: Diagnosis not present

## 2014-08-15 DIAGNOSIS — E785 Hyperlipidemia, unspecified: Secondary | ICD-10-CM

## 2014-08-15 DIAGNOSIS — I2510 Atherosclerotic heart disease of native coronary artery without angina pectoris: Secondary | ICD-10-CM | POA: Diagnosis not present

## 2014-08-15 NOTE — Patient Instructions (Addendum)
Happy Early Rudene Anda!  Your physician has recommended you make the following change in your medication - STOP Effient  Your physician wants you to follow-up in: 1 year with Dr. Debara Pickett. You will receive a reminder letter in the mail two months in advance. If you don't receive a letter, please call our office to schedule the follow-up appointment.

## 2014-08-15 NOTE — Progress Notes (Signed)
Marland Kitchen    OFFICE NOTE  Chief Complaint:  Follow-up palpitations  Primary Care Physician: Deloria Lair, MD  HPI:  FINDLEY VI is a 69 y/o M with history of CAD (NSTEMI 07/2013 s/p DES to mLCx/OM1/mLAD with nonobstructive RCA disease, normal EF), HLD, and GERD. He has done well since his PCI. He has been participating regularly in cardiac rehab without any recent exertional symptoms. He has been compliant with medication. He was recently seen in the ER for sharp chest discomfort slightly to the left of his sternum. It does not radiate. He denies any associated SOB, nausea, vomiting, diaphoresis, palpitations, near-syncope, syncope or bleeding. The pain is not similar to his prior angina (that was more pressure-like and with associated sx). It is worse with palpation. It is not usually worse with exertion but has been on a few occasions. Pain is not worse with inspiration or recumbency. He took Tums and several SL NTG without any change in discomfort. The discomfort has tended to come on in the morning, last all day long, then resolve at night when lying down. He walks his dog 3-4 times a day and wonders if walking her in the snow (which was more difficult) could've led to some shoulder straining. No recent travel, surgery, bedrest, LEE, or orthopnea. His wife is a Marine scientist and urged him to get checked out. In the ED, VSS except mild sinus bradycardia, not tachycardic, tachypneic or hypoxic. Normal CBC, BMET, troponin x 1. CXR no edema or consolidation. He was admitted for observation and treated with IV ketorolac with improvement in symptoms. He remained CP free. Troponins remained negative x 5 and labs remained stable. TSH 4.2. He did have some one episode of sinus bradycardia overnight in the low 40's while sleeping (asymptomatic). He does admit to a h/o OSA but has not been able to tolerate CPAP. He denies any dizziness, near-syncope, syncope or any symptoms of bradycardia. LDL remains at goal at  40.  Mr. Plant returns today for follow-up. He denies any chest pain or worsening shortness of breath. He is resume cardiac rehabilitation and doing well. He will need to continue on Prasugrel at least until this summer.  I saw Mr. Sarvis in the office today. Recently he called in as he woke up one morning and was noted to have a low heart rate and some palpitations. Apparently his wife listened to him who is a Marine scientist and thought he may have A. fib. Ultimately came in for an EKG which was normal. He has not had any reoccurrence. We contemplated a monitor however since he's not had recurrence at this point I don't think there is evidence to monitor him as he has another episode. Were also seeing him today as it's been 1 year since his prior stent. I believe at this point he could come off of Effient. He is not having any chest pain and is physically active.  PMHx:  Past Medical History  Diagnosis Date  . Myocardial infarction 08/02/13  . Environmental allergies   . Kidney stone   . Transfusion history     autologous transfusion during back surgery  . 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 NSTEMI 07/2013 - DES to mLCx, DES to prox OM1, DES to mLAD with nonobstructive RCA stenosis, EF 55-65%.  . Hyperlipidemia LDL goal <70   . GERD (gastroesophageal reflux disease)   . Sinus bradycardia   . 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  . OSA (obstructive sleep apnea)     Past Surgical History  Procedure Laterality Date  . Back surgery      x3  . Kidney stone surgery    . Coronary angioplasty with stent placement  08/06/13    Promus DES to LCX, OM1, & LAD   . Left heart catheterization with coronary angiogram N/A 08/05/2013    Procedure: LEFT HEART CATHETERIZATION WITH CORONARY ANGIOGRAM;  Surgeon: Blane Ohara, MD;  Location: [facility] CATH LAB;  Service: Cardiovascular;  Laterality: N/A;  . Shoulder surgery Right      FAMHx:  Family History  Problem Relation Age of Onset  . Coronary artery disease Mother     CABG  . CAD Sister   . Cancer Sister   . Alzheimer's disease Father   . Cancer Brother     SOCHx:   reports that he has never smoked. He has never used smokeless tobacco. He reports that he drinks about 0.6 oz of alcohol per week. He reports that he does not use illicit drugs.  ALLERGIES:  Allergies  Allergen Reactions  . Percocet [Oxycodone-Acetaminophen]     Nausea     ROS: A comprehensive review of systems was negative.  HOME MEDS: Current Outpatient Prescriptions  Medication Sig Dispense Refill  . aspirin 81 MG chewable tablet Chew 1 tablet (81 mg total) by mouth daily.    Marland Kitchen atorvastatin (LIPITOR) 40 MG tablet Take 1 tablet (40 mg total) by mouth every evening. 90 tablet 1  . metoprolol tartrate (LOPRESSOR) 25 MG tablet TAKE 1/2 TABLET TWICE DAILY 90 tablet 3  . nitroGLYCERIN (NITROSTAT) 0.4 MG SL tablet Place 1 tablet (0.4 mg total) under the tongue every 5 (five) minutes as needed for chest pain. 25 tablet 12  . omeprazole (PRILOSEC) 20 MG capsule Take 1 capsule (20 mg total) by mouth daily. 90 capsule 3  . zolpidem (AMBIEN) 10 MG tablet Take 10 mg by mouth at bedtime.     No current facility-administered medications for this visit.    LABS/IMAGING: No results found for this or any previous visit (from the past 48 hour(s)). No results found.  VITALS: BP 130/62 mmHg  Pulse 56  Ht 5\' 4"  (1.626 m)  Wt 185 lb 4.8 oz (84.052 kg)  BMI 31.79 kg/m2  EXAM: Deferred  EKG: Deferred  ASSESSMENT: 1. Coronary artery disease with NSTEMI (PCI to mLCX, OM1 and mLAD-07/2013) 2. Dyslipidemia 3. GERD 4. Palpitations  PLAN: 1.   Mr. Embleton has had no chest pain or worsening shortness of breath. It's been 1 year since his stent and I feel that he could come off of Effient at this time. He should continue on low-dose aspirin. His cholesterol has been well controlled and he'll  be due for repeat lipid profile next year. He takes Prilosec for his reflux. He recently had an episode of palpitations and irregular heartbeat one morning when waking up. Apparently his wife listened to him and thought he might have A. fib but we do not see any reoccurrence and he is not complaining of any further palpitations. I think we need to see more of frequent episodes before considering monitoring to try to detect atrial fibrillation because there is only been one episode. I advised him to contact me if further palpitations occurring we could consider a monitor. Otherwise plan to see him back annually or sooner as necessary.  Pixie Casino, MD, [facility] Attending  Cardiologist [place] 08/15/2014, 3:45 PM

## 2014-08-16 ENCOUNTER — Encounter (HOSPITAL_COMMUNITY): Payer: Self-pay

## 2014-08-18 ENCOUNTER — Encounter (HOSPITAL_COMMUNITY): Payer: Self-pay

## 2014-08-21 ENCOUNTER — Encounter (HOSPITAL_COMMUNITY): Payer: Self-pay

## 2014-08-23 ENCOUNTER — Encounter (HOSPITAL_COMMUNITY): Payer: Self-pay

## 2014-08-25 ENCOUNTER — Encounter (HOSPITAL_COMMUNITY): Payer: Self-pay

## 2014-08-28 ENCOUNTER — Encounter (HOSPITAL_COMMUNITY): Payer: Self-pay

## 2014-08-30 ENCOUNTER — Encounter (HOSPITAL_COMMUNITY): Payer: Self-pay

## 2014-09-01 ENCOUNTER — Encounter (HOSPITAL_COMMUNITY): Payer: Self-pay

## 2014-09-04 ENCOUNTER — Encounter (HOSPITAL_COMMUNITY): Payer: Self-pay

## 2014-09-06 ENCOUNTER — Encounter (HOSPITAL_COMMUNITY): Payer: Self-pay

## 2014-10-02 ENCOUNTER — Other Ambulatory Visit: Payer: Self-pay | Admitting: Cardiology

## 2014-10-02 NOTE — Telephone Encounter (Signed)
Rx has been sent to the pharmacy electronically. ° °

## 2015-04-24 ENCOUNTER — Telehealth: Payer: Self-pay | Admitting: Internal Medicine

## 2015-04-24 NOTE — Telephone Encounter (Signed)
1. Type of surgery: right knee: KA - medial OR lateral menisectomy  2. Date of surgery: 05/02/2015 3. Surgeon: Dr. Hector Shade 4. Medications that need to be held & how long: none specified - takes aspirin 81mg  5. Fax and/or Phone: (p) [phone]  (f) [phone] -- Fabio Asa (surgical scheduler)

## 2015-04-26 NOTE — Telephone Encounter (Signed)
Clearance printed and faxed to [facility]

## 2015-04-26 NOTE — Telephone Encounter (Signed)
Low risk for surgery. Ok to proceed. Hold aspirin for 7 days prior to surgery and restart as soon as practical afterward.  Dr. Debara Pickett

## 2015-04-26 NOTE — Telephone Encounter (Signed)
Clearance letter routed via EPIC to Dr. Peri Maris office.

## 2015-04-27 ENCOUNTER — Encounter (HOSPITAL_BASED_OUTPATIENT_CLINIC_OR_DEPARTMENT_OTHER): Payer: Self-pay | Admitting: *Deleted

## 2015-04-27 NOTE — Progress Notes (Signed)
NPO AFTER MN.  ARRIVE AT 0830.  NEEDS ISTAT.  CURRENT EKG IN CHART AND EPIC.  WILL TAKE METOPROLOL AM DOS W/ SIPS OF WATER.

## 2015-04-29 ENCOUNTER — Ambulatory Visit: Payer: Self-pay | Admitting: Orthopedic Surgery

## 2015-04-29 NOTE — Progress Notes (Signed)
Preoperative surgical orders have been place into the Epic hospital system for Ronald Berger on 04/29/2015, 7:29 PM  by Mickel Crow for surgery on 05/02/15.  Preop Knee Scope orders including IV Tylenol and IV Decadron as long as there are no contraindications to the above medications. [name], PA-C

## 2015-05-02 ENCOUNTER — Ambulatory Visit (HOSPITAL_BASED_OUTPATIENT_CLINIC_OR_DEPARTMENT_OTHER): Payer: Medicare HMO | Admitting: Anesthesiology

## 2015-05-02 ENCOUNTER — Encounter (HOSPITAL_BASED_OUTPATIENT_CLINIC_OR_DEPARTMENT_OTHER)
Admission: RE | Disposition: A | Discharge status: Home / Self Care | Payer: Self-pay | Source: Ambulatory Visit | Attending: Orthopedic Surgery

## 2015-05-02 ENCOUNTER — Encounter (HOSPITAL_BASED_OUTPATIENT_CLINIC_OR_DEPARTMENT_OTHER): Payer: Self-pay | Admitting: *Deleted

## 2015-05-02 ENCOUNTER — Ambulatory Visit (HOSPITAL_BASED_OUTPATIENT_CLINIC_OR_DEPARTMENT_OTHER)
Admission: RE | Admit: 2015-05-02 | Discharge: 2015-05-02 | Disposition: A | Discharge status: Home / Self Care | Payer: Medicare HMO | Source: Ambulatory Visit | Attending: Orthopedic Surgery | Admitting: Orthopedic Surgery

## 2015-05-02 DIAGNOSIS — K219 Gastro-esophageal reflux disease without esophagitis: Secondary | ICD-10-CM | POA: Insufficient documentation

## 2015-05-02 DIAGNOSIS — Z7982 Long term (current) use of aspirin: Secondary | ICD-10-CM | POA: Insufficient documentation

## 2015-05-02 DIAGNOSIS — S83241A Other tear of medial meniscus, current injury, right knee, initial encounter: Secondary | ICD-10-CM | POA: Insufficient documentation

## 2015-05-02 DIAGNOSIS — G4730 Sleep apnea, unspecified: Secondary | ICD-10-CM | POA: Diagnosis not present

## 2015-05-02 DIAGNOSIS — E785 Hyperlipidemia, unspecified: Secondary | ICD-10-CM | POA: Diagnosis not present

## 2015-05-02 DIAGNOSIS — G4733 Obstructive sleep apnea (adult) (pediatric): Secondary | ICD-10-CM | POA: Insufficient documentation

## 2015-05-02 DIAGNOSIS — I2510 Atherosclerotic heart disease of native coronary artery without angina pectoris: Secondary | ICD-10-CM | POA: Diagnosis not present

## 2015-05-02 DIAGNOSIS — I252 Old myocardial infarction: Secondary | ICD-10-CM | POA: Diagnosis not present

## 2015-05-02 DIAGNOSIS — Z79899 Other long term (current) drug therapy: Secondary | ICD-10-CM | POA: Insufficient documentation

## 2015-05-02 DIAGNOSIS — X58XXXA Exposure to other specified factors, initial encounter: Secondary | ICD-10-CM | POA: Insufficient documentation

## 2015-05-02 DIAGNOSIS — S83249A Other tear of medial meniscus, current injury, unspecified knee, initial encounter: Secondary | ICD-10-CM | POA: Diagnosis present

## 2015-05-02 DIAGNOSIS — Z87891 Personal history of nicotine dependence: Secondary | ICD-10-CM | POA: Diagnosis not present

## 2015-05-02 HISTORY — DX: Old myocardial infarction: I25.2

## 2015-05-02 HISTORY — PX: KNEE ARTHROSCOPY: SHX127

## 2015-05-02 HISTORY — DX: Unspecified tear of unspecified meniscus, current injury, right knee, initial encounter: S83.206A

## 2015-05-02 HISTORY — DX: Allergic rhinitis, unspecified: J30.9

## 2015-05-02 HISTORY — DX: Presence of coronary angioplasty implant and graft: Z95.5

## 2015-05-02 HISTORY — DX: Personal history of urinary calculi: Z87.442

## 2015-05-02 HISTORY — DX: Hyperlipidemia, unspecified: E78.5

## 2015-05-02 HISTORY — DX: Personal history of other specified conditions: Z87.898

## 2015-05-02 LAB — POCT I-STAT 4, (NA,K, GLUC, HGB,HCT)
Glucose, Bld: 107 mg/dL — ABNORMAL HIGH (ref 65–99)
HEMATOCRIT: 44 % (ref 39.0–52.0)
HEMOGLOBIN: 15 g/dL (ref 13.0–17.0)
Potassium: 4.2 mmol/L (ref 3.5–5.1)
Sodium: 142 mmol/L (ref 135–145)

## 2015-05-02 SURGERY — ARTHROSCOPY, KNEE
Anesthesia: General | Site: Knee | Laterality: Right

## 2015-05-02 MED ORDER — ACETAMINOPHEN 10 MG/ML IV SOLN
1000.0000 mg | Freq: Once | INTRAVENOUS | Status: DC
  Filled 2015-05-02: qty 100

## 2015-05-02 MED ORDER — DEXAMETHASONE SODIUM PHOSPHATE 10 MG/ML IJ SOLN
10.0000 mg | Freq: Once | INTRAMUSCULAR | Status: DC
  Filled 2015-05-02: qty 1

## 2015-05-02 MED ORDER — PROPOFOL 10 MG/ML IV BOLUS
INTRAVENOUS | Status: AC
  Filled 2015-05-02: qty 40

## 2015-05-02 MED ORDER — MIDAZOLAM HCL 5 MG/5ML IJ SOLN
INTRAMUSCULAR | Status: DC | PRN
  Administered 2015-05-02: 2 mg via INTRAVENOUS

## 2015-05-02 MED ORDER — HYDROMORPHONE HCL 2 MG PO TABS: 2.0000 mg | ORAL_TABLET | ORAL | Status: DC | PRN

## 2015-05-02 MED ORDER — MIDAZOLAM HCL 2 MG/2ML IJ SOLN
INTRAMUSCULAR | Status: AC
  Filled 2015-05-02: qty 2

## 2015-05-02 MED ORDER — ONDANSETRON HCL 4 MG/2ML IJ SOLN
INTRAMUSCULAR | Status: DC | PRN
  Administered 2015-05-02: 4 mg via INTRAVENOUS

## 2015-05-02 MED ORDER — CHLORHEXIDINE GLUCONATE 4 % EX LIQD
60.0000 mL | Freq: Once | CUTANEOUS | Status: AC
  Administered 2015-05-02: 4 via TOPICAL
  Filled 2015-05-02: qty 60

## 2015-05-02 MED ORDER — KETOROLAC TROMETHAMINE 30 MG/ML IJ SOLN
INTRAMUSCULAR | Status: AC
  Filled 2015-05-02: qty 1

## 2015-05-02 MED ORDER — ACETAMINOPHEN 10 MG/ML IV SOLN
INTRAVENOUS | Status: AC
  Filled 2015-05-02: qty 100

## 2015-05-02 MED ORDER — METHOCARBAMOL 500 MG PO TABS
ORAL_TABLET | ORAL | Status: AC
  Filled 2015-05-02: qty 1

## 2015-05-02 MED ORDER — HYDROMORPHONE HCL 2 MG PO TABS
2.0000 mg | ORAL_TABLET | Freq: Once | ORAL | Status: AC
  Administered 2015-05-02: 2 mg via ORAL
  Filled 2015-05-02: qty 1

## 2015-05-02 MED ORDER — BUPIVACAINE-EPINEPHRINE 0.25% -1:200000 IJ SOLN
INTRAMUSCULAR | Status: DC | PRN
  Administered 2015-05-02: 20 mL

## 2015-05-02 MED ORDER — CEFAZOLIN SODIUM-DEXTROSE 2-4 GM/100ML-% IV SOLN
INTRAVENOUS | Status: AC
  Filled 2015-05-02: qty 100

## 2015-05-02 MED ORDER — ONDANSETRON HCL 4 MG PO TABS: 4.0000 mg | ORAL_TABLET | Freq: Three times a day (TID) | ORAL | Status: DC | PRN

## 2015-05-02 MED ORDER — METHOCARBAMOL 500 MG PO TABS: 500.0000 mg | ORAL_TABLET | Freq: Four times a day (QID) | ORAL | Status: DC

## 2015-05-02 MED ORDER — FENTANYL CITRATE (PF) 100 MCG/2ML IJ SOLN
INTRAMUSCULAR | Status: DC | PRN
  Administered 2015-05-02: 50 ug via INTRAVENOUS
  Administered 2015-05-02: 100 ug via INTRAVENOUS
  Administered 2015-05-02: 50 ug via INTRAVENOUS

## 2015-05-02 MED ORDER — METHOCARBAMOL 500 MG PO TABS
500.0000 mg | ORAL_TABLET | Freq: Once | ORAL | Status: AC
  Administered 2015-05-02: 500 mg via ORAL
  Filled 2015-05-02: qty 1

## 2015-05-02 MED ORDER — SODIUM CHLORIDE 0.9 % IV SOLN
INTRAVENOUS | Status: DC
  Administered 2015-05-02 (×2): via INTRAVENOUS
  Filled 2015-05-02: qty 1000

## 2015-05-02 MED ORDER — SODIUM CHLORIDE 0.9 % IR SOLN
Status: DC | PRN
  Administered 2015-05-02: 6000 mL

## 2015-05-02 MED ORDER — METOCLOPRAMIDE HCL 5 MG/ML IJ SOLN
10.0000 mg | Freq: Once | INTRAMUSCULAR | Status: DC | PRN
  Filled 2015-05-02: qty 2

## 2015-05-02 MED ORDER — ACETAMINOPHEN 10 MG/ML IV SOLN
INTRAVENOUS | Status: DC | PRN
  Administered 2015-05-02: 1000 mg via INTRAVENOUS

## 2015-05-02 MED ORDER — FENTANYL CITRATE (PF) 100 MCG/2ML IJ SOLN
INTRAMUSCULAR | Status: AC
  Filled 2015-05-02: qty 2

## 2015-05-02 MED ORDER — KETOROLAC TROMETHAMINE 30 MG/ML IJ SOLN
INTRAMUSCULAR | Status: DC | PRN
  Administered 2015-05-02: 30 mg via INTRAVENOUS

## 2015-05-02 MED ORDER — HYDROMORPHONE HCL 2 MG PO TABS
ORAL_TABLET | ORAL | Status: AC
  Filled 2015-05-02: qty 1

## 2015-05-02 MED ORDER — LACTATED RINGERS IV SOLN
INTRAVENOUS | Status: DC
  Filled 2015-05-02: qty 1000

## 2015-05-02 MED ORDER — OMEPRAZOLE 20 MG PO CPDR: 20.0000 mg | DELAYED_RELEASE_CAPSULE | Freq: Every evening | ORAL | Status: DC

## 2015-05-02 MED ORDER — POVIDONE-IODINE 10 % EX SWAB
2.0000 "application " | Freq: Once | CUTANEOUS | Status: DC
  Filled 2015-05-02: qty 2

## 2015-05-02 MED ORDER — LIDOCAINE HCL (CARDIAC) 20 MG/ML IV SOLN
INTRAVENOUS | Status: DC | PRN
  Administered 2015-05-02: 80 mg via INTRAVENOUS

## 2015-05-02 MED ORDER — FENTANYL CITRATE (PF) 100 MCG/2ML IJ SOLN
25.0000 ug | INTRAMUSCULAR | Status: DC | PRN
  Filled 2015-05-02: qty 1

## 2015-05-02 MED ORDER — PROPOFOL 10 MG/ML IV BOLUS
INTRAVENOUS | Status: DC | PRN
  Administered 2015-05-02: 30 mg via INTRAVENOUS
  Administered 2015-05-02: 170 mg via INTRAVENOUS

## 2015-05-02 MED ORDER — MEPERIDINE HCL 25 MG/ML IJ SOLN
6.2500 mg | INTRAMUSCULAR | Status: DC | PRN
  Filled 2015-05-02: qty 1

## 2015-05-02 MED ORDER — DEXAMETHASONE SODIUM PHOSPHATE 4 MG/ML IJ SOLN
INTRAMUSCULAR | Status: DC | PRN
  Administered 2015-05-02: 10 mg via INTRAVENOUS

## 2015-05-02 MED ORDER — CEFAZOLIN SODIUM-DEXTROSE 2-4 GM/100ML-% IV SOLN
2.0000 g | INTRAVENOUS | Status: AC
  Administered 2015-05-02: 2 g via INTRAVENOUS
  Filled 2015-05-02: qty 100

## 2015-05-02 MED FILL — METHOCARBAMOL 500 MG TABLET: 500 | 7 days supply | Qty: 30 | Fill #0

## 2015-05-02 MED FILL — HYDROmorphone HCL 2 MG TABS: 2 | 3 days supply | Qty: 30 | Fill #0

## 2015-05-02 MED FILL — ONDANSETRON HCL 4 MG TABLET: 4 | 6 days supply | Qty: 20 | Fill #0

## 2015-05-02 SURGICAL SUPPLY — 42 items
BANDAGE ACE 6X5 VEL STRL LF (GAUZE/BANDAGES/DRESSINGS) ×3 IMPLANT
BANDAGE ELASTIC 6 VELCRO ST LF (GAUZE/BANDAGES/DRESSINGS) ×3 IMPLANT
BLADE 4.2CUDA (BLADE) ×3 IMPLANT
BLADE CUDA SHAVER 3.5 (BLADE) IMPLANT
BLADE CUTTER GATOR 3.5 (BLADE) IMPLANT
CANISTER SUCTION 2500CC (MISCELLANEOUS) IMPLANT
CLOTH BEACON ORANGE TIMEOUT ST (SAFETY) ×3 IMPLANT
CUFF TOURN SGL QUICK 34 (TOURNIQUET CUFF) ×2
CUFF TRNQT CYL 34X4X40X1 (TOURNIQUET CUFF) ×1 IMPLANT
DRAPE ARTHROSCOPY W/POUCH 114 (DRAPES) ×3 IMPLANT
DRAPE U-SHAPE 47X51 STRL (DRAPES) ×3 IMPLANT
DRSG EMULSION OIL 3X3 NADH (GAUZE/BANDAGES/DRESSINGS) ×3 IMPLANT
DRSG PAD ABDOMINAL 8X10 ST (GAUZE/BANDAGES/DRESSINGS) ×3 IMPLANT
DURAPREP 26ML APPLICATOR (WOUND CARE) ×3 IMPLANT
GLOVE BIO SURGEON STRL SZ8 (GLOVE) ×3 IMPLANT
GLOVE INDICATOR 8.0 STRL GRN (GLOVE) ×3 IMPLANT
GOWN STRL REUS W/ TWL LRG LVL3 (GOWN DISPOSABLE) ×1 IMPLANT
GOWN STRL REUS W/ TWL XL LVL3 (GOWN DISPOSABLE) ×1 IMPLANT
GOWN STRL REUS W/TWL LRG LVL3 (GOWN DISPOSABLE) ×2
GOWN STRL REUS W/TWL XL LVL3 (GOWN DISPOSABLE) ×2
IV NS IRRIG 3000ML ARTHROMATIC (IV SOLUTION) ×6 IMPLANT
KIT ROOM TURNOVER WOR (KITS) ×3 IMPLANT
KNEE WRAP E Z 3 GEL PACK (MISCELLANEOUS) ×3 IMPLANT
MANIFOLD NEPTUNE II (INSTRUMENTS) IMPLANT
PACK ARTHROSCOPY DSU (CUSTOM PROCEDURE TRAY) ×3 IMPLANT
PACK BASIN DAY SURGERY FS (CUSTOM PROCEDURE TRAY) ×3 IMPLANT
PADDING CAST ABS 4INX4YD NS (CAST SUPPLIES) ×2
PADDING CAST ABS 6INX4YD NS (CAST SUPPLIES) ×2
PADDING CAST ABS COTTON 4X4 ST (CAST SUPPLIES) ×1 IMPLANT
PADDING CAST ABS COTTON 6X4 NS (CAST SUPPLIES) ×1 IMPLANT
PADDING CAST COTTON 6X4 STRL (CAST SUPPLIES) ×3 IMPLANT
SET ARTHROSCOPY TUBING (MISCELLANEOUS) ×2
SET ARTHROSCOPY TUBING LN (MISCELLANEOUS) ×1 IMPLANT
SPONGE GAUZE 4X4 12PLY (GAUZE/BANDAGES/DRESSINGS) ×3 IMPLANT
SPONGE GAUZE 4X4 12PLY STER LF (GAUZE/BANDAGES/DRESSINGS) ×3 IMPLANT
SUT ETHILON 4 0 PS 2 18 (SUTURE) ×3 IMPLANT
TOWEL OR 17X24 6PK STRL BLUE (TOWEL DISPOSABLE) ×3 IMPLANT
TUBE CONNECTING 12'X1/4 (SUCTIONS)
TUBE CONNECTING 12X1/4 (SUCTIONS) IMPLANT
WAND 30 DEG SABER W/CORD (SURGICAL WAND) IMPLANT
WAND 90 DEG TURBOVAC W/CORD (SURGICAL WAND) ×3 IMPLANT
WATER STERILE IRR 500ML POUR (IV SOLUTION) ×3 IMPLANT

## 2015-05-02 NOTE — Discharge Instructions (Signed)
° °Dr. Frank Aluisio °Total Joint Specialist °[facility] °[address] °[place], [place] [address] °[phone] ° ° °Arthroscopic Procedure, Knee °An arthroscopic procedure can find what is wrong with your knee. °PROCEDURE °Arthroscopy is a surgical technique that allows your orthopedic surgeon to diagnose and treat your knee injury with accuracy. They will look into your knee through a small instrument. This is almost like a small (pencil sized) telescope. Because arthroscopy affects your knee less than open knee surgery, you can anticipate a more rapid recovery. Taking an active role by following your caregiver's instructions will help with rapid and complete recovery. Use crutches, rest, elevation, ice, and knee exercises as instructed. The length of recovery depends on various factors including type of injury, age, physical condition, medical conditions, and your rehabilitation. °Your knee is the joint between the large bones (femur and tibia) in your leg. Cartilage covers these bone ends which are smooth and slippery and allow your knee to bend and move smoothly. Two menisci, thick, semi-lunar shaped pads of cartilage which form a rim inside the joint, help absorb shock and stabilize your knee. Ligaments bind the bones together and support your knee joint. Muscles move the joint, help support your knee, and take stress off the joint itself. Because of this all programs and physical therapy to rehabilitate an injured or repaired knee require rebuilding and strengthening your muscles. °AFTER THE PROCEDURE °· After the procedure, you will be moved to a recovery area until most of the effects of the medication have worn off. Your caregiver will discuss the test results with you.  °· Only take over-the-counter or prescription medicines for pain, discomfort, or fever as directed by your caregiver.  °SEEK MEDICAL CARE IF:  °· You have increased bleeding from your wounds.  °· You see  redness, swelling, or have increasing pain in your wounds.  °· You have pus coming from your wound.  °· You have an oral temperature above 102° F (38.9° C).  °· You notice a bad smell coming from the wound or dressing.  °· You have severe pain with any motion of your knee.  °SEEK IMMEDIATE MEDICAL CARE IF:  °· You develop a rash.  °· You have difficulty breathing.  °· You have any allergic problems.  °FURTHER INSTRUCTIONS:  °· ICE to the affected knee every three hours for 30 minutes at a time and then as needed for pain and swelling.  Continue to use ice on the knee for pain and swelling from surgery. You may notice swelling that will progress down to the foot and ankle.  This is normal after surgery.  Elevate the leg when you are not up walking on it.   ° °DIET °You may resume your previous home diet once your are discharged from the hospital. ° °DRESSING / WOUND CARE / SHOWERING °You may start showering two days after being discharged home but do not submerge the incisions under water.  °Change dressing 48 hours after the procedure and then cover the small incisions with band aids until your follow up visit. °Change the surgical dressings daily and reapply a dry dressing each time.  ° °ACTIVITY °Walk with your walker as instructed. °Use walker as long as suggested by your caregivers. °Avoid periods of inactivity such as sitting longer than an hour when not asleep. This helps prevent blood clots.  °You may resume a sexual relationship in one month or when given the OK by your doctor.  °You may return to   work once you are cleared by your doctor.  °Do not drive a car for 6 weeks or until released by you surgeon.  °Do not drive while taking narcotics. ° °WEIGHT BEARING AS TOLERATED ° °POSTOPERATIVE CONSTIPATION PROTOCOL °Constipation - defined medically as fewer than three stools per week and severe constipation as less than one stool per week. ° °One of the most common issues patients have following surgery is  constipation.  Even if you have a regular bowel pattern at home, your normal regimen is likely to be disrupted due to multiple reasons following surgery.  Combination of anesthesia, postoperative narcotics, change in appetite and fluid intake all can affect your bowels.  In order to avoid complications following surgery, here are some recommendations in order to help you during your recovery period. ° °Colace (docusate) - Pick up an over-the-counter form of Colace or another stool softener and take twice a day as long as you are requiring postoperative pain medications.  Take with a full glass of water daily.  If you experience loose stools or diarrhea, hold the colace until you stool forms back up.  If your symptoms do not get better within 1 week or if they get worse, check with your doctor. ° °Dulcolax (bisacodyl) - Pick up over-the-counter and take as directed by the product packaging as needed to assist with the movement of your bowels.  Take with a full glass of water.  Use this product as needed if not relieved by Colace only.  ° °MiraLax (polyethylene glycol) - Pick up over-the-counter to have on hand.  MiraLax is a solution that will increase the amount of water in your bowels to assist with bowel movements.  Take as directed and can mix with a glass of water, juice, soda, coffee, or tea.  Take if you go more than two days without a movement. °Do not use MiraLax more than once per day. Call your doctor if you are still constipated or irregular after using this medication for 7 days in a row. ° °If you continue to have problems with postoperative constipation, please contact the office for further assistance and recommendations.  If you experience "the worst abdominal pain ever" or develop nausea or vomiting, please contact the office immediatly for further recommendations for treatment. ° °ITCHING ° If you experience itching with your medications, try taking only a single pain pill, or even half a pain pill  at a time.  You can also use Benadryl over the counter for itching or also to help with sleep.  ° °TED HOSE STOCKINGS °Wear the elastic stockings on both legs for three weeks following surgery during the day but you may remove then at night for sleeping. ° °MEDICATIONS °See your medication summary on the “After Visit Summary” that the nursing staff will review with you prior to discharge.  You may have some home medications which will be placed on hold until you complete the course of blood thinner medication.  It is important for you to complete the blood thinner medication as prescribed by your surgeon.  Continue your approved medications as instructed at time of discharge. °Do not drive while taking narcotics.  ° °PRECAUTIONS °If you experience chest pain or shortness of breath - call 911 immediately for transfer to the hospital emergency department.  °If you develop a fever greater that 101 F, purulent drainage from wound, increased redness or drainage from wound, foul odor from the wound/dressing, or calf pain - CONTACT YOUR SURGEON.   °                                                °  FOLLOW-UP APPOINTMENTS °Make sure you keep all of your appointments after your operation with your surgeon and caregivers. You should call the office at [phone]  and make an appointment for approximately one week after the date of your surgery or on the date instructed by your surgeon outlined in the "After Visit Summary". ° °RANGE OF MOTION AND STRENGTHENING EXERCISES  °Rehabilitation of the knee is important following a knee injury or an operation. After just a few days of immobilization, the muscles of the thigh which control the knee become weakened and shrink (atrophy). Knee exercises are designed to build up the tone and strength of the thigh muscles and to improve knee motion. Often times heat used for twenty to thirty minutes before working out will loosen up your tissues and help with improving the range of motion  but do not use heat for the first two weeks following surgery. These exercises can be done on a training (exercise) mat, on the floor, on a table or on a bed. Use what ever works the best and is most comfortable for you Knee exercises include: ° °QUAD STRENGTHENING EXERCISES °Strengthening Quadriceps Sets ° °Tighten muscles on top of thigh by pushing knees down into floor or table. °Hold for 20 seconds. Repeat 10 times. °Do 2 sessions per day. ° ° ° ° °Strengthening Terminal Knee Extension ° °With knee bent over bolster, straighten knee by tightening muscle on top of thigh. Be sure to keep bottom of knee on bolster. °Hold for 20 seconds. Repeat 10 times. °Do 2 sessions per day. ° ° °Straight Leg with Bent Knee ° °Lie on back with opposite leg bent. Keep involved knee slightly bent at knee and raise leg 4-6". Hold for 10 seconds. °Repeat 20 times per set. °Do 2 sets per session. °Do 2 sessions per day. ° ° °Post Anesthesia Home Care Instructions ° °Activity: °Get plenty of rest for the remainder of the day. A responsible adult should stay with you for 24 hours following the procedure.  °For the next 24 hours, DO NOT: °-Drive a car °-Operate machinery °-Drink alcoholic beverages °-Take any medication unless instructed by your physician °-Make any legal decisions or sign important papers. ° °Meals: °Start with liquid foods such as gelatin or soup. Progress to regular foods as tolerated. Avoid greasy, spicy, heavy foods. If nausea and/or vomiting occur, drink only clear liquids until the nausea and/or vomiting subsides. Call your physician if vomiting continues. ° °Special Instructions/Symptoms: °Your throat may feel dry or sore from the anesthesia or the breathing tube placed in your throat during surgery. If this causes discomfort, gargle with warm salt water. The discomfort should disappear within 24 hours. ° °If you had a scopolamine patch placed behind your ear for the management of post- operative nausea and/or  vomiting: ° °1. The medication in the patch is effective for 72 hours, after which it should be removed.  Wrap patch in a tissue and discard in the trash. Wash hands thoroughly with soap and water. °2. You may remove the patch earlier than 72 hours if you experience unpleasant side effects which may include dry mouth, dizziness or visual disturbances. °3. Avoid touching the patch. Wash your hands with soap and water after contact with the patch. °  ° °

## 2015-05-02 NOTE — Anesthesia Procedure Notes (Signed)
Procedure Name: LMA Insertion Date/Time: 05/02/2015 10:29 AM Performed by: Wanita Chamberlain Pre-anesthesia Checklist: Patient identified, Timeout performed, Emergency Drugs available, Suction available and Patient being monitored Patient Re-evaluated:Patient Re-evaluated prior to inductionOxygen Delivery Method: Circle system utilized Preoxygenation: Pre-oxygenation with 100% oxygen Intubation Type: IV induction Ventilation: Mask ventilation without difficulty LMA: LMA inserted LMA Size: 4.0 Number of attempts: 1 Placement Confirmation: breath sounds checked- equal and bilateral and positive ETCO2 Tube secured with: Tape Dental Injury: Teeth and Oropharynx as per pre-operative assessment

## 2015-05-02 NOTE — Anesthesia Preprocedure Evaluation (Addendum)
Anesthesia Evaluation  Patient identified by MRN, date of birth, ID band Patient awake    Reviewed: Allergy & Precautions, NPO status , Patient's Chart, lab work & pertinent test results  Airway Mallampati: II  TM Distance: >3 FB Neck ROM: Full    Dental no notable dental hx. (+) Partial Upper, Dental Advisory Given   Pulmonary asthma , sleep apnea , former smoker,    Pulmonary exam normal breath sounds clear to auscultation       Cardiovascular Exercise Tolerance: Good + CAD, + Past MI and + Cardiac Stents (2015 DESx3)  Normal cardiovascular exam Rhythm:Regular Rate:Normal     Neuro/Psych negative neurological ROS  negative psych ROS   GI/Hepatic negative GI ROS, Neg liver ROS, GERD  Medicated and Controlled,  Endo/Other  negative endocrine ROS  Renal/GU negative Renal ROS  negative genitourinary   Musculoskeletal negative musculoskeletal ROS (+)   Abdominal   Peds negative pediatric ROS (+)  Hematology negative hematology ROS (+)   Anesthesia Other Findings   Reproductive/Obstetrics negative OB ROS                          Anesthesia Physical Anesthesia Plan  ASA: III  Anesthesia Plan: General   Post-op Pain Management:    Induction: Intravenous  Airway Management Planned: LMA  Additional Equipment:   Intra-op Plan:   Post-operative Plan: Extubation in OR  Informed Consent: I have reviewed the patients History and Physical, chart, labs and discussed the procedure including the risks, benefits and alternatives for the proposed anesthesia with the patient or authorized representative who has indicated his/her understanding and acceptance.   Dental advisory given  Plan Discussed with: CRNA  Anesthesia Plan Comments:        Anesthesia Quick Evaluation

## 2015-05-02 NOTE — Transfer of Care (Signed)
Immediate Anesthesia Transfer of Care Note  Patient: Ronald Berger  Procedure(s) Performed: Procedure(s): RIGHT KNEE ARTHROSCOPY WITH medial meniscal DEBRIDEMENT  (Right)  Patient Location: PACU  Anesthesia Type:General  Level of Consciousness: awake, alert , oriented and patient cooperative  Airway & Oxygen Therapy: Patient Spontanous Breathing and Patient connected to nasal cannula oxygen  Post-op Assessment: Report given to RN and Post -op Vital signs reviewed and stable  Post vital signs: Reviewed and stable  Last Vitals:  Filed Vitals:   05/02/15 0903 05/02/15 1120  BP: 126/67   Pulse: 49 59  Temp: 36.5 C   Resp: 14 17    Complications: No apparent anesthesia complications

## 2015-05-02 NOTE — Interval H&P Note (Signed)
History and Physical Interval Note:  05/02/2015 10:20 AM  Ronald Berger  has presented today for surgery, with the diagnosis of RIGHT KNEE MEDIAL MENISCUS TEAR   The various methods of treatment have been discussed with the patient and family. After consideration of risks, benefits and other options for treatment, the patient has consented to  Procedure(s): RIGHT KNEE ARTHROSCOPY WITH DEBRIDEMENT  (Right) as a surgical intervention .  The patient's history has been reviewed, patient examined, no change in status, stable for surgery.  I have reviewed the patient's chart and labs.  Questions were answered to the patient's satisfaction.     Gearlean Alf

## 2015-05-02 NOTE — H&P (Signed)
CC- Ronald Berger is a 70 y.o. male who presents with right knee pain.  HPI- . Knee Pain: Patient presents with knee pain involving the  right knee. Onset of the symptoms was several months ago. Inciting event: slipped in [place] while [place]. Current symptoms include giving out, pain located medially and popping sensation. Pain is aggravated by lateral movements, pivoting, rising after sitting and squatting.  Patient has not hadprior knee problems. Evaluation to date: MRI: abnormal medial meniscal tear. Treatment to date: rest.  Past Medical History  Diagnosis Date  . GERD (gastroesophageal reflux disease)   . Sinus bradycardia   . 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  . CAD, multiple vessel CARDIOLOGIST-  DR HILTY    a. NSTEMI 07/2013 - DES to mLCx, DES to prox OM1, DES to mLAD with nonobstructive RCA stenosis, EF 55-65%.  . History of non-ST elevation myocardial infarction (NSTEMI)     07/ 2015  S/P  DES X3  . S/P drug eluting coronary stent placement     07/ 2015  x3  to mLCFX, OM1, mLAD  . Dyslipidemia   . OSA (obstructive sleep apnea)     cpap intolerant  . Allergic rhinitis   . History of kidney stones   . Right knee meniscal tear   . History of palpitations     Past Surgical History  Procedure Laterality Date  . Left heart catheterization with coronary angiogram N/A 08/05/2013    Procedure: LEFT HEART CATHETERIZATION WITH CORONARY ANGIOGRAM;  Surgeon: Blane Ohara, MD;  Location: [facility] CATH LAB;  Service: Cardiovascular;  Laterality: N/A;  Promus DES's to  mLCFx,  OM1,  mLAD (total 3)/   mRCA 30%,  dLM  20-30%,  preserved LVSF, ef 55-65%  . Lumbar spine surgery  x3  last one 1980's  . Extracorporeal shock wave lithotripsy  x2 last one 2005  . Shoulder arthroscopy Right 2013    Prior to Admission medications   Medication Sig Start Date End Date Taking? Authorizing Provider  aspirin  81 MG chewable tablet Chew 1 tablet (81 mg total) by mouth daily. 08/06/13  Yes Delfina Redwood, MD  atorvastatin (LIPITOR) 40 MG tablet TAKE 1 TABLET DAILY AT 6PM -DOSE CHANGE 10/02/14  Yes Pixie Casino, MD  metoprolol tartrate (LOPRESSOR) 25 MG tablet TAKE 1/2 TABLET TWICE DAILY 06/22/14  Yes Pixie Casino, MD  nitroGLYCERIN (NITROSTAT) 0.4 MG SL tablet Place 1 tablet (0.4 mg total) under the tongue every 5 (five) minutes as needed for chest pain. 12/22/13  Yes Lelon Perla, MD  omeprazole (PRILOSEC) 20 MG capsule Take 1 capsule (20 mg total) by mouth daily. Patient taking differently: Take 20 mg by mouth every evening.  08/23/13  Yes Isaiah Serge, NP  zolpidem (AMBIEN) 10 MG tablet Take 10 mg by mouth at bedtime.   Yes Historical Provider, MD   KNEE EXAM antalgic gait, soft tissue tenderness over medial joint line, effusion, negative drawer sign, collateral ligaments intact  Physical Examination: General appearance - alert, well appearing, and in no distress Mental status - alert, oriented to person, place, and time Chest - clear to auscultation, no wheezes, rales or rhonchi, symmetric air entry Heart - normal rate, regular rhythm, normal S1, S2, no murmurs, rubs, clicks or gallops Abdomen - soft, nontender, nondistended, no masses or organomegaly Neurological - alert, oriented, normal speech, no focal  findings or movement disorder noted    Asessment/Plan--- Right knee medial meniscal tear- - Plan right knee arthroscopy with meniscal debridement. Procedure risks and potential comps discussed with patient who elects to proceed. Goals are decreased pain and increased function with a high likelihood of achieving both

## 2015-05-02 NOTE — Op Note (Signed)
Preoperative diagnosis-  Right knee medial meniscal tear  Postoperative diagnosis Right- knee medial meniscal tear  Procedure- Right knee arthroscopy with medial  meniscal debridement    Surgeon- Dione Plover. Arless Vineyard, MD  Anesthesia-General  EBL-  Minimal  Complications- None  Condition- PACU - hemodynamically stable.  Brief clinical note- -Ronald Berger is a 70 y.o.  male with a several month history of right knee pain and mechanical symptoms. Exam and history suggested medial meniscal tear confirmed by MRI. The patient presents now for arthroscopy and debridement   Procedure in detail -       After successful administration of General anesthetic, a tourmiquet is placed high on the Right  thigh and the Right lower extremity is prepped and draped in the usual sterile fashion. Time out is performed by the surgical team. Standard superomedial and inferolateral portal sites are marked and incisions made with an 11 blade. The inflow cannula is passed through the superomedial portal and camera through the inferolateral portal and inflow is initiated. Arthroscopic visualization proceeds.      The undersurface of the patella and trochlea are visualized and they are normal. The medial and lateral gutters are visualized and there are no loose bodies. Flexion and valgus force is applied to the knee and the medial compartment is entered. A spinal needle is passed into the joint through the site marked for the inferomedial portal. A small incision is made and the dilator passed into the joint. The findings for the medial compartment are unstable tear of body and posterior horn of the medial meniscus . The tear is debrided to a stable base with baskets and a shaver and sealed off with the Arthrocare.  It is probed and found to be stable.    The intercondylar notch is visualized and the ACL appears normal. The lateral compartment is entered and the findings are normal .     The joint is again inspected  and there are no other tears, defects or loose bodies identified. The arthroscopic equipment is then removed from the inferior portals which are closed with interrupted 4-0 nylon. 20 ml of .25% Marcaine with epinephrine are injected through the inflow cannula and the cannula is then removed and the portal closed with nylon. The incisions are cleaned and dried and a bulky sterile dressing is applied. The patient is then awakened and transported to recovery in stable condition.   05/02/2015, 11:01 AM

## 2015-05-03 ENCOUNTER — Encounter (HOSPITAL_BASED_OUTPATIENT_CLINIC_OR_DEPARTMENT_OTHER): Payer: Self-pay | Admitting: Orthopedic Surgery

## 2015-05-03 NOTE — Anesthesia Postprocedure Evaluation (Signed)
Anesthesia Post Note  Patient: Ronald Berger  Procedure(s) Performed: Procedure(s) (LRB): RIGHT KNEE ARTHROSCOPY WITH medial meniscal DEBRIDEMENT  (Right)  Patient location during evaluation: PACU Anesthesia Type: General Level of consciousness: awake and alert Pain management: pain level controlled Vital Signs Assessment: post-procedure vital signs reviewed and stable Respiratory status: spontaneous breathing, nonlabored ventilation, respiratory function stable and patient connected to nasal cannula oxygen Cardiovascular status: blood pressure returned to baseline and stable Postop Assessment: no signs of nausea or vomiting Anesthetic complications: no    Last Vitals:  Filed Vitals:   05/02/15 1200 05/02/15 1330  BP: 105/66 132/69  Pulse: 54 51  Temp:  36.6 C  Resp: 18 16    Last Pain:  Filed Vitals:   05/03/15 0906  PainSc: 0-No pain                 [name]

## 2015-05-05 ENCOUNTER — Other Ambulatory Visit: Payer: Self-pay | Admitting: Internal Medicine

## 2015-05-07 NOTE — Telephone Encounter (Signed)
Rx(s) sent to pharmacy electronically.  

## 2015-06-19 ENCOUNTER — Other Ambulatory Visit: Payer: Self-pay | Admitting: *Deleted

## 2015-06-19 DIAGNOSIS — E785 Hyperlipidemia, unspecified: Secondary | ICD-10-CM

## 2015-06-23 LAB — LIPID PANEL
CHOL/HDL RATIO: 2.1 ratio (ref <=5.0)
Cholesterol: 111 mg/dL — ABNORMAL LOW (ref 125–200)
HDL: 53 mg/dL (ref >=40)
LDL CALC: 41 mg/dL (ref <130)
Triglycerides: 84 mg/dL (ref <150)
VLDL: 17 mg/dL (ref <30)

## 2015-07-06 ENCOUNTER — Telehealth: Payer: Self-pay | Admitting: Internal Medicine

## 2015-07-06 NOTE — Telephone Encounter (Signed)
F/u  Pt stated he was called about lab results. Please call back and discuss.

## 2015-07-06 NOTE — Telephone Encounter (Signed)
Results discussed, pt verbalized understanding and thanks.

## 2015-07-09 ENCOUNTER — Other Ambulatory Visit: Payer: Self-pay | Admitting: Internal Medicine

## 2015-07-16 ENCOUNTER — Other Ambulatory Visit: Payer: Self-pay | Admitting: Internal Medicine

## 2015-07-16 NOTE — Telephone Encounter (Signed)
Rx request sent to pharmacy.  

## 2015-08-20 ENCOUNTER — Ambulatory Visit: Payer: Medicare HMO | Admitting: Internal Medicine

## 2015-08-29 ENCOUNTER — Ambulatory Visit (INDEPENDENT_AMBULATORY_CARE_PROVIDER_SITE_OTHER): Payer: Medicare HMO | Admitting: Internal Medicine

## 2015-08-29 ENCOUNTER — Encounter: Payer: Self-pay | Admitting: Internal Medicine

## 2015-08-29 VITALS — BP 134/81 | HR 49 | Ht 69.0 in | Wt 206.2 lb

## 2015-08-29 DIAGNOSIS — E785 Hyperlipidemia, unspecified: Secondary | ICD-10-CM

## 2015-08-29 DIAGNOSIS — I2510 Atherosclerotic heart disease of native coronary artery without angina pectoris: Secondary | ICD-10-CM

## 2015-08-29 DIAGNOSIS — R002 Palpitations: Secondary | ICD-10-CM | POA: Diagnosis not present

## 2015-08-29 NOTE — Patient Instructions (Signed)
Your physician wants you to follow-up in: 1 year with Dr. Hilty. You will receive a reminder letter in the mail two months in advance. If you don't receive a letter, please call our office to schedule the follow-up appointment.  

## 2015-08-29 NOTE — Progress Notes (Signed)
[facility]    OFFICE NOTE  Chief Complaint:  Routine follow-up  Primary Care Physician: Deloria Lair, MD  HPI:  Ronald Berger is a 70 y/o M with history of CAD (NSTEMI 07/2013 s/p DES to mLCx/OM1/mLAD with nonobstructive RCA disease, normal EF), HLD, and GERD. He has done well since his PCI. He has been participating regularly in cardiac rehab without any recent exertional symptoms. He has been compliant with medication. He was recently seen in the ER for sharp chest discomfort slightly to the left of his sternum. It does not radiate. He denies any associated SOB, nausea, vomiting, diaphoresis, palpitations, near-syncope, syncope or bleeding. The pain is not similar to his prior angina (that was more pressure-like and with associated sx). It is worse with palpation. It is not usually worse with exertion but has been on a few occasions. Pain is not worse with inspiration or recumbency. He took Tums and several SL NTG without any change in discomfort. The discomfort has tended to come on in the morning, last all day long, then resolve at night when lying down. He walks his dog 3-4 times a day and wonders if walking her in the snow (which was more difficult) could've led to some shoulder straining. No recent travel, surgery, bedrest, LEE, or orthopnea. His wife is a Marine scientist and urged him to get checked out. In the ED, VSS except mild sinus bradycardia, not tachycardic, tachypneic or hypoxic. Normal CBC, BMET, troponin x 1. CXR no edema or consolidation. He was admitted for observation and treated with IV ketorolac with improvement in symptoms. He remained CP free. Troponins remained negative x 5 and labs remained stable. TSH 4.2. He did have some one episode of sinus bradycardia overnight in the low 40's while sleeping (asymptomatic). He does admit to a h/o OSA but has not been able to tolerate CPAP. He denies any dizziness, near-syncope, syncope or any symptoms of bradycardia. LDL remains at goal at 40.  [name] returns today for follow-up. He denies any chest pain or worsening shortness of breath. He is resume cardiac rehabilitation and doing well. He will need to continue on Prasugrel at least until this summer.  I saw Ronald Berger in the office today. Recently he called in as he woke up one morning and was noted to have a low heart rate and some palpitations. Apparently his wife listened to him who is a Marine scientist and thought he may have A. fib. Ultimately came in for an EKG which was normal. He has not had any reoccurrence. We contemplated a monitor however since he's not had recurrence at this point I don't think there is evidence to monitor him as he has another episode. Were also seeing him today as it's been 1 year since his prior stent. I believe at this point he could come off of Effient. He is not having any chest pain and is physically active.  08/29/2015  Ronald Berger was seen today in the office in follow-up. Over the past year he denies any cardiac complaints such as chest pain or worsening shortness of breath. Unfortunately recent knee injury and underwent the surgery which was arthroscopic in April. He still recovering from that. [name] amount of weight gain. Despite this recent cholesterol profile in June showed total cholesterol 111, triglycerides 84, HDL 53 and LDL 41. This represents good control. Heart rate remains slow around 50 on low-dose metoprolol, but he is asymptomatic with this. He does not appreciate any significant palpitations although they  are picked up by his wife is a Marine scientist.  PMHx:  Past Medical History:  Diagnosis Date  . Allergic rhinitis   . 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  . CAD, multiple vessel CARDIOLOGIST-  DR Aluel Berger   a. NSTEMI 07/2013 - DES to mLCx, DES to prox OM1, DES to mLAD with nonobstructive RCA stenosis, EF 55-65%.  . Dyslipidemia   . GERD (gastroesophageal reflux  disease)   . History of kidney stones   . History of non-ST elevation myocardial infarction (NSTEMI)    07/ 2015  S/P  DES X3  . History of palpitations   . OSA (obstructive sleep apnea)    cpap intolerant  . Right knee meniscal tear   . S/P drug eluting coronary stent placement    07/ 2015  x3  to mLCFX, OM1, mLAD  . Sinus bradycardia     Past Surgical History:  Procedure Laterality Date  . EXTRACORPOREAL SHOCK WAVE LITHOTRIPSY  x2 last one 2005  . KNEE ARTHROSCOPY Right 05/02/2015   Procedure: RIGHT KNEE ARTHROSCOPY WITH medial meniscal DEBRIDEMENT ;  Surgeon: Gaynelle Arabian, MD;  Location: [facility];  Service: Orthopedics;  Laterality: Right;  . LEFT HEART CATHETERIZATION WITH CORONARY ANGIOGRAM N/A 08/05/2013   Procedure: LEFT HEART CATHETERIZATION WITH CORONARY ANGIOGRAM;  Surgeon: Blane Ohara, MD;  Location: [facility] CATH LAB;  Service: Cardiovascular;  Laterality: N/A;  Promus DES's to  mLCFx,  OM1,  mLAD (total 3)/   mRCA 30%,  dLM  20-30%,  preserved LVSF, ef 55-65%  . LUMBAR SPINE SURGERY  x3  last one 1980's  . SHOULDER ARTHROSCOPY Right 2013    FAMHx:  Family History  Problem Relation Age of Onset  . Coronary artery disease Mother     CABG  . CAD Sister   . Cancer Sister   . Alzheimer's disease Father   . Cancer Brother     SOCHx:   reports that he quit smoking about 49 years ago. His smoking use included Cigarettes. He quit after 1.00 year of use. He has never used smokeless tobacco. He reports that he drinks alcohol. He reports that he does not use drugs.  ALLERGIES:  Allergies  Allergen Reactions  . Oxycodone Nausea And Vomiting    ROS: Pertinent items noted in HPI and remainder of comprehensive ROS otherwise negative.  HOME MEDS: Current Outpatient Prescriptions  Medication Sig Dispense Refill  . aspirin 81 MG chewable tablet Chew 1 tablet (81 mg total) by mouth daily.    [facility] atorvastatin (LIPITOR) 40 MG tablet TAKE 1 TABLET EVERY DAY 90  tablet 0  . HYDROmorphone (DILAUDID) 2 MG tablet Take 1-2 tablets (2-4 mg total) by mouth every 4 (four) hours as needed for severe pain. 30 tablet 0  . metoprolol tartrate (LOPRESSOR) 25 MG tablet TAKE 1/2 TABLET TWICE DAILY 90 tablet 2  . nitroGLYCERIN (NITROSTAT) 0.4 MG SL tablet Place 1 tablet (0.4 mg total) under the tongue every 5 (five) minutes as needed for chest pain. 25 tablet 12  . omeprazole (PRILOSEC) 20 MG capsule Take 1 capsule (20 mg total) by mouth every evening. 90 capsule 3  . ondansetron (ZOFRAN) 4 MG tablet Take 1 tablet (4 mg total) by mouth every 8 (eight) hours as needed for nausea or vomiting. 20 tablet 0  . zolpidem (AMBIEN) 10 MG tablet Take 10 mg by mouth at bedtime.  No current facility-administered medications for this visit.     LABS/IMAGING: No results found for this or any previous visit (from the past 48 hour(s)). No results found.  VITALS: BP 134/81   Pulse (!) 49   Ht 5\' 9"  (1.753 m)   Wt 206 lb 3.2 oz (93.5 kg)   BMI 30.45 kg/m   EXAM: General appearance: alert and no distress Neck: no carotid bruit and no JVD Lungs: clear to auscultation bilaterally Heart: regular rate and rhythm, S1, S2 normal, no murmur, click, rub or gallop Abdomen: soft, non-tender; bowel sounds normal; no masses,  no organomegaly Extremities: extremities normal, atraumatic, no cyanosis or edema Pulses: 2+ and symmetric Skin: Skin color, texture, turgor normal. No rashes or lesions Neurologic: Grossly normal Psych: Pleasant  EKG: Sinus bradycardia at 49  ASSESSMENT: 1. Coronary artery disease with NSTEMI (PCI to mLCX, OM1 and mLAD-07/2013) 2. Dyslipidemia 3. GERD 4. Palpitations - rare  PLAN: 1.   Mr. Sarabia is doing very well. He is recovering from arthroscopic knee surgery to the right knee. He is cholesterol is at goal. He's had no other chest pain or shortness of breath complaints. He started to become more physically active because of a small amount of  weight gain. He has rare palpitations which he is not aware of but are picked up by his wife is a Marine scientist. She is concerned about his bradycardia but he's asymptomatic with this and is able to do exercise and a lot of physical activity without any limitation. Continue current medicines for now. Plan to see him back in a year or sooner as necessary.  Pixie Casino, MD, [facility] Attending Cardiologist [place] 08/29/2015, 8:22 AM

## 2015-09-10 ENCOUNTER — Other Ambulatory Visit: Payer: Self-pay | Admitting: Internal Medicine

## 2016-02-11 ENCOUNTER — Other Ambulatory Visit: Payer: Self-pay | Admitting: Internal Medicine

## 2016-04-19 IMAGING — DX DG CHEST 2V
2 series · 2 of 2 positions shown · non-contrast
Comparison: August 04, 2013

CLINICAL DATA: Three-day history of left-sided chest pain

EXAM:
CHEST  2 VIEW

[chest pa]
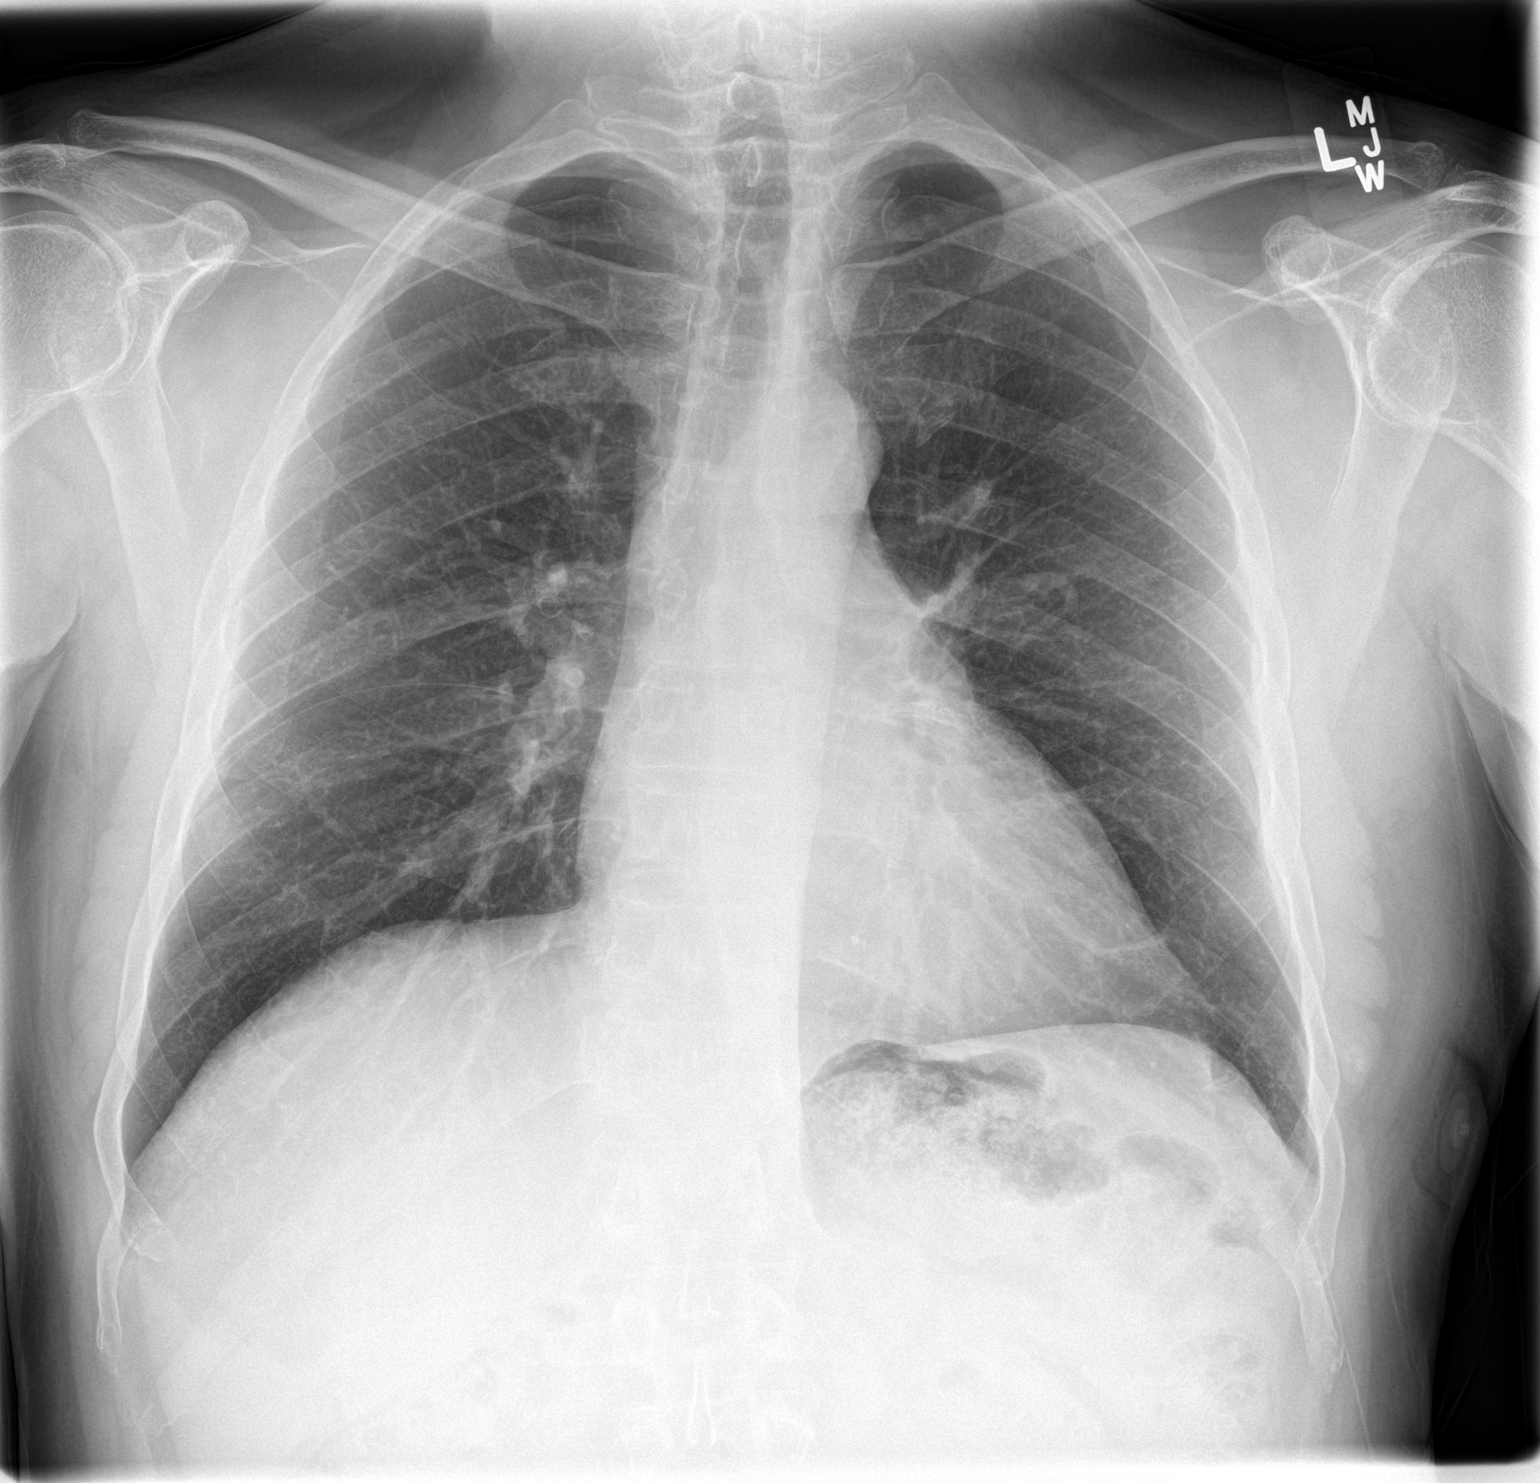

[chest lat]
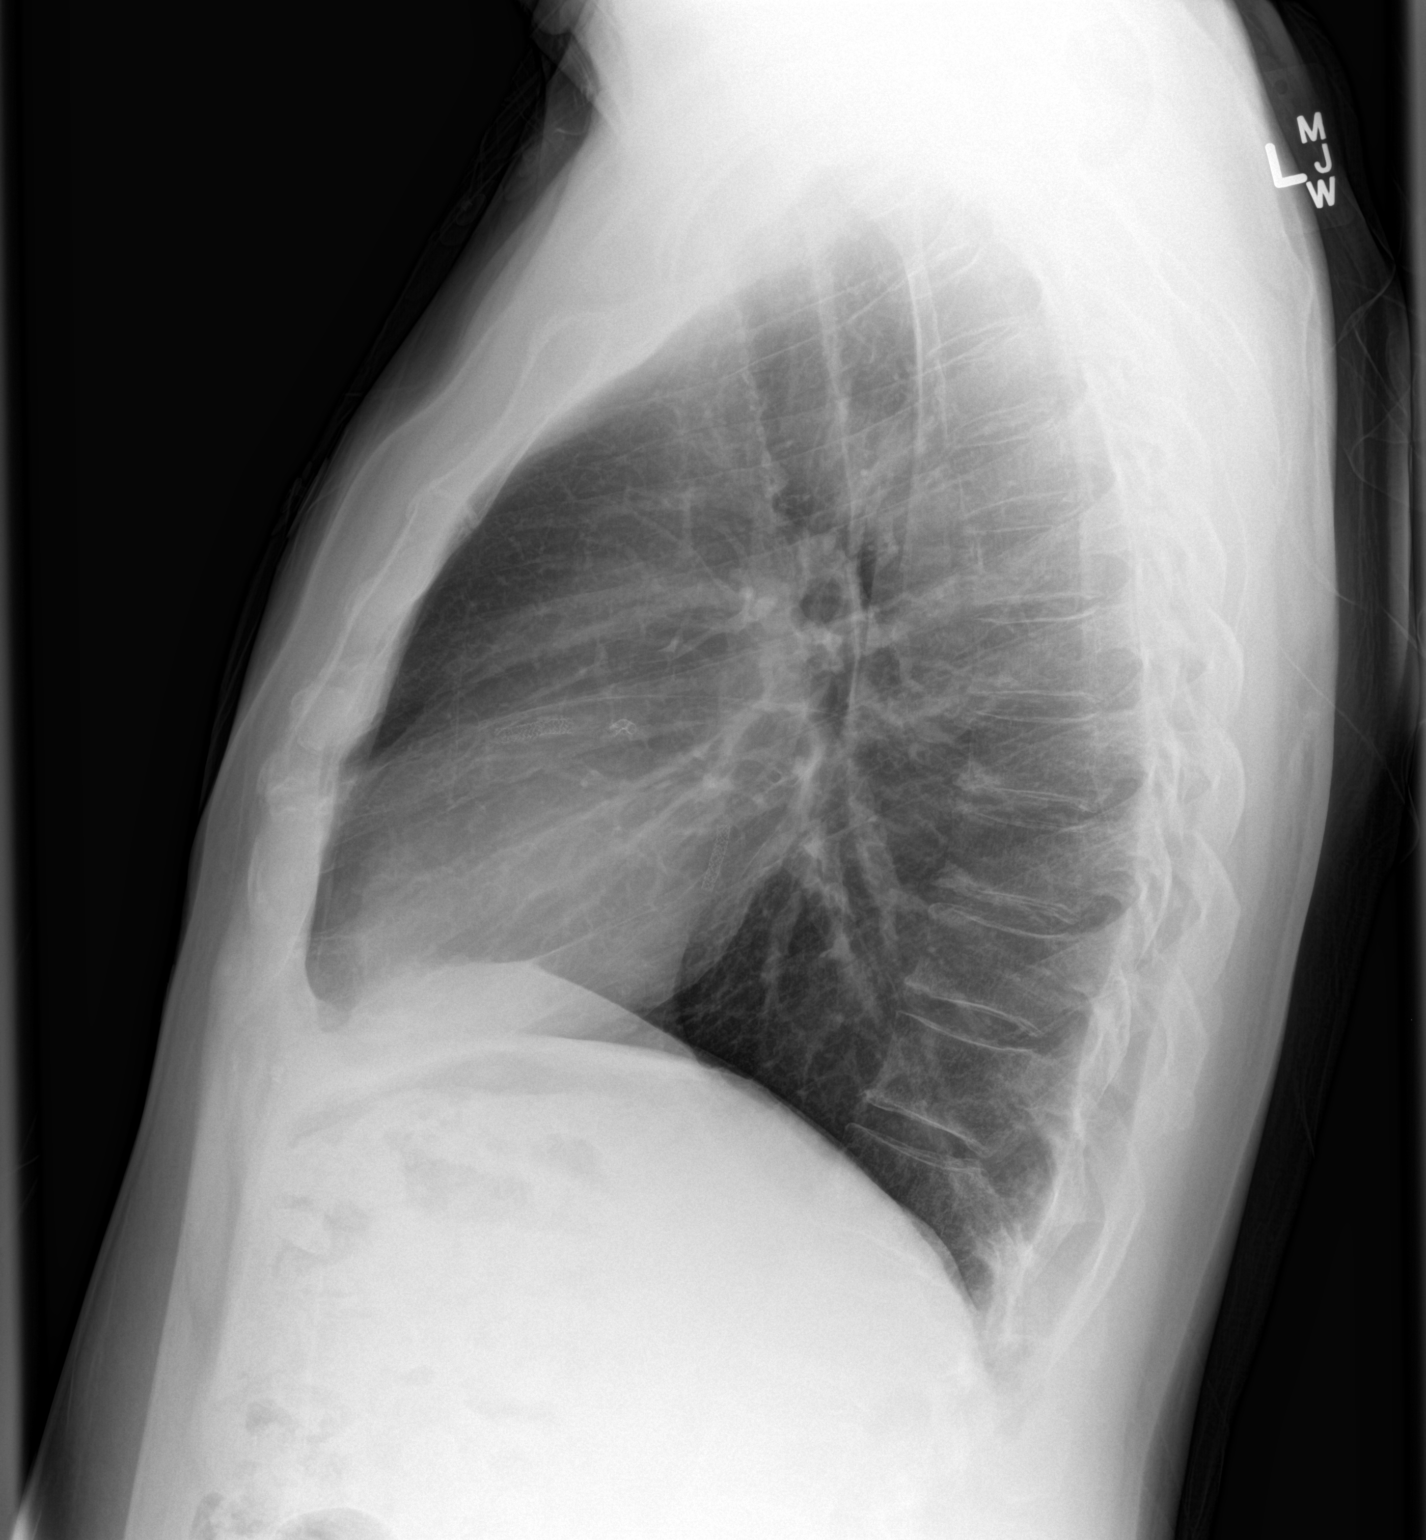

[2 of 2 positions shown; findings below may reference images not displayed]

FINDINGS: There is no edema or consolidation. The heart size and pulmonary
vascularity are normal. No adenopathy. No bone lesions. There are
stents in the left anterior descending and circumflex coronary
arteries.
IMPRESSION: No edema or consolidation.

## 2016-07-22 ENCOUNTER — Other Ambulatory Visit: Payer: Self-pay | Admitting: Internal Medicine

## 2016-08-18 ENCOUNTER — Encounter: Payer: Self-pay | Admitting: *Deleted

## 2016-08-28 ENCOUNTER — Encounter: Payer: Self-pay | Admitting: Internal Medicine

## 2016-08-28 ENCOUNTER — Ambulatory Visit (INDEPENDENT_AMBULATORY_CARE_PROVIDER_SITE_OTHER): Payer: Medicare HMO | Admitting: Internal Medicine

## 2016-08-28 VITALS — BP 130/76 | HR 52 | Ht 69.0 in | Wt 191.0 lb

## 2016-08-28 DIAGNOSIS — E785 Hyperlipidemia, unspecified: Secondary | ICD-10-CM

## 2016-08-28 DIAGNOSIS — R002 Palpitations: Secondary | ICD-10-CM | POA: Diagnosis not present

## 2016-08-28 DIAGNOSIS — I2510 Atherosclerotic heart disease of native coronary artery without angina pectoris: Secondary | ICD-10-CM

## 2016-08-28 NOTE — Progress Notes (Signed)
Marland Kitchen    OFFICE NOTE  Chief Complaint:  No complaints  Primary Care Physician: Deloria Lair., MD  HPI:  Ronald Berger is a 71 y/o M with history of CAD (NSTEMI 07/2013 s/p DES to mLCx/OM1/mLAD with nonobstructive RCA disease, normal EF), HLD, and GERD. He has done well since his PCI. He has been participating regularly in cardiac rehab without any recent exertional symptoms. He has been compliant with medication. He was recently seen in the ER for sharp chest discomfort slightly to the left of his sternum. It does not radiate. He denies any associated SOB, nausea, vomiting, diaphoresis, palpitations, near-syncope, syncope or bleeding. The pain is not similar to his prior angina (that was more pressure-like and with associated sx). It is worse with palpation. It is not usually worse with exertion but has been on a few occasions. Pain is not worse with inspiration or recumbency. He took Tums and several SL NTG without any change in discomfort. The discomfort has tended to come on in the morning, last all day long, then resolve at night when lying down. He walks his dog 3-4 times a day and wonders if walking her in the snow (which was more difficult) could've led to some shoulder straining. No recent travel, surgery, bedrest, LEE, or orthopnea. His wife is a Marine scientist and urged him to get checked out. In the ED, VSS except mild sinus bradycardia, not tachycardic, tachypneic or hypoxic. Normal CBC, BMET, troponin x 1. CXR no edema or consolidation. He was admitted for observation and treated with IV ketorolac with improvement in symptoms. He remained CP free. Troponins remained negative x 5 and labs remained stable. TSH 4.2. He did have some one episode of sinus bradycardia overnight in the low 40's while sleeping (asymptomatic). He does admit to a h/o OSA but has not been able to tolerate CPAP. He denies any dizziness, near-syncope, syncope or any symptoms of bradycardia. LDL remains at goal at 40.  [name] returns today for follow-up. He denies any chest pain or worsening shortness of breath. He is resume cardiac rehabilitation and doing well. He will need to continue on Prasugrel at least until this summer.  I saw Ronald Berger in the office today. Recently he called in as he woke up one morning and was noted to have a low heart rate and some palpitations. Apparently his wife listened to him who is a Marine scientist and thought he may have A. fib. Ultimately came in for an EKG which was normal. He has not had any reoccurrence. We contemplated a monitor however since he's not had recurrence at this point I don't think there is evidence to monitor him as he has another episode. Were also seeing him today as it's been 1 year since his prior stent. I believe at this point he could come off of Effient. He is not having any chest pain and is physically active.  08/29/2015  Ronald Berger was seen today in the office in follow-up. Over the past year he denies any cardiac complaints such as chest pain or worsening shortness of breath. Unfortunately recent knee injury and underwent the surgery which was arthroscopic in April. He still recovering from that. [name] amount of weight gain. Despite this recent cholesterol profile in June showed total cholesterol 111, triglycerides 84, HDL 53 and LDL 41. This represents good control. Heart rate remains slow around 50 on low-dose metoprolol, but he is asymptomatic with this. He does not appreciate any significant palpitations although  they are picked up by his wife is a Marine scientist.  08/28/2016  Ronald Berger returns today for follow-up. Is been exactly one year since I last saw him. He denies any chest pain or worsening shortness of breath. 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 Cholesterol is very well controlled. Blood pressure is at goal. He denies any recurrent chest pain.  PMHx:  Past Medical History:  Diagnosis Date  . Allergic rhinitis   .  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  . 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 NSTEMI 07/2013 - DES to mLCx, DES to prox OM1, DES to mLAD with nonobstructive RCA stenosis, EF 55-65%.  . Dyslipidemia   . GERD (gastroesophageal reflux disease)   . History of kidney stones   . History of non-ST elevation myocardial infarction (NSTEMI)    07/ 2015  S/P  DES X3  . History of palpitations   . OSA (obstructive sleep apnea)    cpap intolerant  . Right knee meniscal tear   . S/P drug eluting coronary stent placement    07/ 2015  x3  to mLCFX, OM1, mLAD  . Sinus bradycardia     Past Surgical History:  Procedure Laterality Date  . EXTRACORPOREAL SHOCK WAVE LITHOTRIPSY  x2 last one 2005  . KNEE ARTHROSCOPY Right 05/02/2015   Procedure: RIGHT KNEE ARTHROSCOPY WITH medial meniscal DEBRIDEMENT ;  Surgeon: Gaynelle Arabian, MD;  Location: [facility];  Service: Orthopedics;  Laterality: Right;  . LEFT HEART CATHETERIZATION WITH CORONARY ANGIOGRAM N/A 08/05/2013   Procedure: LEFT HEART CATHETERIZATION WITH CORONARY ANGIOGRAM;  Surgeon: Blane Ohara, MD;  Location: [facility] CATH LAB;  Service: Cardiovascular;  Laterality: N/A;  Promus DES's to  mLCFx,  OM1,  mLAD (total 3)/   mRCA 30%,  dLM  20-30%,  preserved LVSF, ef 55-65%  . LUMBAR SPINE SURGERY  x3  last one 1980's  . SHOULDER ARTHROSCOPY Right 2013    FAMHx:  Family History  Problem Relation Age of Onset  . Coronary artery disease Mother        CABG  . CAD Sister   . Cancer Sister   . Alzheimer's disease Father   . Cancer Brother     SOCHx:   reports that he quit smoking about 50 years ago. His smoking use included Cigarettes. He quit after 1.00 year of use. He has never used smokeless tobacco. He reports that he drinks alcohol. He reports that he does not use drugs.  ALLERGIES:  Allergies  Allergen Reactions  . Oxycodone Nausea And  Vomiting    ROS: Pertinent items noted in HPI and remainder of comprehensive ROS otherwise negative.  HOME MEDS: Current Outpatient Prescriptions  Medication Sig Dispense Refill  . aspirin 81 MG chewable tablet Chew 1 tablet (81 mg total) by mouth daily.    Marland Kitchen atorvastatin (LIPITOR) 40 MG tablet TAKE 1 TABLET EVERY DAY 90 tablet 0  . HYDROmorphone (DILAUDID) 2 MG tablet Take 1-2 tablets (2-4 mg total) by mouth every 4 (four) hours as needed for severe pain. 30 tablet 0  . metoprolol tartrate (LOPRESSOR) 25 MG tablet TAKE 1/2 TABLET TWICE DAILY 90 tablet 2  . nitroGLYCERIN (NITROSTAT) 0.4 MG SL tablet Place 1 tablet (0.4 mg total) under the tongue every 5 (five) minutes as needed for chest pain. 25 tablet 12  . omeprazole (  PRILOSEC) 20 MG capsule Take 1 capsule (20 mg total) by mouth every evening. 90 capsule 3  . ondansetron (ZOFRAN) 4 MG tablet Take 1 tablet (4 mg total) by mouth every 8 (eight) hours as needed for nausea or vomiting. 20 tablet 0  . zolpidem (AMBIEN) 10 MG tablet Take 10 mg by mouth at bedtime.     No current facility-administered medications for this visit.     LABS/IMAGING: No results found for this or any previous visit (from the past 48 hour(s)). No results found.  VITALS: BP 130/76   Pulse (!) 52   Ht 5\' 9"  (1.753 m)   Wt 191 lb (86.6 kg)   BMI 28.21 kg/m   EXAM: General appearance: alert and no distress Neck: no carotid bruit and no JVD Lungs: clear to auscultation bilaterally Heart: regular rate and rhythm, S1, S2 normal, no murmur, click, rub or gallop Abdomen: soft, non-tender; bowel sounds normal; no masses,  no organomegaly Extremities: extremities normal, atraumatic, no cyanosis or edema Pulses: 2+ and symmetric Skin: Skin color, texture, turgor normal. No rashes or lesions Neurologic: Grossly normal Psych: Pleasant  EKG: Sinus bradycardia at 52  ASSESSMENT: 1. Coronary artery disease with NSTEMI (PCI to mLCX, OM1 and  mLAD-07/2013) 2. Dyslipidemia 3. GERD 4. Palpitations - rare  PLAN: 1.   Ronald Berger continues to feel well without any new symptoms such as chest pain, palpitations or other associated coronary events. Cholesterol well controlled. He is physically active and has managed to lose a little bit of weight but is encouraged to lose a little more. Plan follow-up with me annually or sooner as necessary.  Pixie Casino, MD, [facility] Attending Cardiologist [place] 08/28/2016, 4:48 PM

## 2016-08-28 NOTE — Patient Instructions (Signed)
Your physician wants you to follow-up in: ONE YEAR with Dr. Hilty. You will receive a reminder letter in the mail two months in advance. If you don't receive a letter, please call our office to schedule the follow-up appointment.  

## 2016-09-15 ENCOUNTER — Other Ambulatory Visit: Payer: Self-pay | Admitting: Internal Medicine

## 2016-09-25 ENCOUNTER — Telehealth: Payer: Self-pay | Admitting: Internal Medicine

## 2016-09-25 ENCOUNTER — Other Ambulatory Visit: Payer: Self-pay | Admitting: Internal Medicine

## 2016-09-25 NOTE — Telephone Encounter (Signed)
S/w pt he states that he saw his PCP and she told him to make an appt with his eye doctor because she saw something on the back of his eye, he went to see eye doctor and she stated that pt had "plaque and blood clots on the back of his eye" she said that this is not good and should tell Dr Debara Pickett, she also referred him to a retina specialist, Dr Dahlia Client to have a retinal scan next week-tuesday. Do you have any instructions before his going to the specialist and having a procedure done? Please advise. Please leave detailed mess if pt does not answer.

## 2016-09-25 NOTE — Telephone Encounter (Signed)
Please call,have something going on with his eye. He wants to talk to you about what is going on and see if this is something that Dr Debara Pickett needs to be aware of.

## 2016-09-26 ENCOUNTER — Encounter (INDEPENDENT_AMBULATORY_CARE_PROVIDER_SITE_OTHER): Payer: Self-pay | Admitting: Ophthalmology

## 2016-09-26 NOTE — Telephone Encounter (Signed)
Ok thanks. I don't have anything else to offer. We are aggressively treating his risk. If there is anything else the ophthalmologist thinks we should look at, they should contact me.  Dr. Lemmie Evens

## 2016-09-26 NOTE — Telephone Encounter (Signed)
Pt notified he will tell the doctor on tuesday

## 2016-09-30 ENCOUNTER — Encounter (INDEPENDENT_AMBULATORY_CARE_PROVIDER_SITE_OTHER): Payer: Medicare HMO | Admitting: Ophthalmology

## 2016-09-30 DIAGNOSIS — I10 Essential (primary) hypertension: Secondary | ICD-10-CM | POA: Diagnosis not present

## 2016-09-30 DIAGNOSIS — H2513 Age-related nuclear cataract, bilateral: Secondary | ICD-10-CM | POA: Diagnosis not present

## 2016-09-30 DIAGNOSIS — H43813 Vitreous degeneration, bilateral: Secondary | ICD-10-CM

## 2016-09-30 DIAGNOSIS — H35033 Hypertensive retinopathy, bilateral: Secondary | ICD-10-CM

## 2016-09-30 DIAGNOSIS — H35371 Puckering of macula, right eye: Secondary | ICD-10-CM

## 2017-03-30 ENCOUNTER — Encounter (INDEPENDENT_AMBULATORY_CARE_PROVIDER_SITE_OTHER): Payer: Medicare HMO | Admitting: Ophthalmology

## 2017-03-30 DIAGNOSIS — I10 Essential (primary) hypertension: Secondary | ICD-10-CM

## 2017-03-30 DIAGNOSIS — H2513 Age-related nuclear cataract, bilateral: Secondary | ICD-10-CM | POA: Diagnosis not present

## 2017-03-30 DIAGNOSIS — H4313 Vitreous hemorrhage, bilateral: Secondary | ICD-10-CM

## 2017-03-30 DIAGNOSIS — H35373 Puckering of macula, bilateral: Secondary | ICD-10-CM

## 2017-03-30 DIAGNOSIS — H35033 Hypertensive retinopathy, bilateral: Secondary | ICD-10-CM | POA: Diagnosis not present

## 2017-04-30 DIAGNOSIS — G4733 Obstructive sleep apnea (adult) (pediatric): Secondary | ICD-10-CM | POA: Insufficient documentation

## 2017-07-09 ENCOUNTER — Other Ambulatory Visit: Payer: Self-pay | Admitting: Internal Medicine

## 2017-07-09 NOTE — Telephone Encounter (Signed)
Rx(s) sent to pharmacy electronically.  

## 2017-09-09 ENCOUNTER — Other Ambulatory Visit: Payer: Self-pay | Admitting: Internal Medicine

## 2017-12-01 LAB — HM HEPATITIS C SCREENING LAB: HM Hepatitis Screen: NEGATIVE

## 2017-12-08 ENCOUNTER — Encounter (INDEPENDENT_AMBULATORY_CARE_PROVIDER_SITE_OTHER): Payer: Self-pay | Admitting: *Deleted

## 2017-12-30 ENCOUNTER — Encounter (INDEPENDENT_AMBULATORY_CARE_PROVIDER_SITE_OTHER): Payer: Medicare HMO | Admitting: Ophthalmology

## 2018-01-21 ENCOUNTER — Encounter (INDEPENDENT_AMBULATORY_CARE_PROVIDER_SITE_OTHER): Payer: Medicare HMO | Admitting: Ophthalmology

## 2018-01-21 DIAGNOSIS — I10 Essential (primary) hypertension: Secondary | ICD-10-CM | POA: Diagnosis not present

## 2018-01-21 DIAGNOSIS — H35373 Puckering of macula, bilateral: Secondary | ICD-10-CM | POA: Diagnosis not present

## 2018-01-21 DIAGNOSIS — H35033 Hypertensive retinopathy, bilateral: Secondary | ICD-10-CM

## 2018-01-21 DIAGNOSIS — H43813 Vitreous degeneration, bilateral: Secondary | ICD-10-CM

## 2018-04-22 ENCOUNTER — Encounter (INDEPENDENT_AMBULATORY_CARE_PROVIDER_SITE_OTHER): Payer: Medicare HMO | Admitting: Ophthalmology

## 2018-04-22 ENCOUNTER — Other Ambulatory Visit: Payer: Self-pay

## 2018-04-22 DIAGNOSIS — H43813 Vitreous degeneration, bilateral: Secondary | ICD-10-CM

## 2018-04-22 DIAGNOSIS — I10 Essential (primary) hypertension: Secondary | ICD-10-CM

## 2018-04-22 DIAGNOSIS — H35033 Hypertensive retinopathy, bilateral: Secondary | ICD-10-CM

## 2018-04-22 DIAGNOSIS — H35373 Puckering of macula, bilateral: Secondary | ICD-10-CM

## 2018-04-22 DIAGNOSIS — H2512 Age-related nuclear cataract, left eye: Secondary | ICD-10-CM

## 2018-08-25 ENCOUNTER — Other Ambulatory Visit: Payer: Self-pay

## 2018-08-25 ENCOUNTER — Encounter (INDEPENDENT_AMBULATORY_CARE_PROVIDER_SITE_OTHER): Payer: Medicare HMO | Admitting: Ophthalmology

## 2018-08-25 DIAGNOSIS — H2512 Age-related nuclear cataract, left eye: Secondary | ICD-10-CM

## 2018-08-25 DIAGNOSIS — H35373 Puckering of macula, bilateral: Secondary | ICD-10-CM

## 2018-08-25 DIAGNOSIS — H35033 Hypertensive retinopathy, bilateral: Secondary | ICD-10-CM

## 2018-08-25 DIAGNOSIS — H43813 Vitreous degeneration, bilateral: Secondary | ICD-10-CM | POA: Diagnosis not present

## 2018-08-25 DIAGNOSIS — I10 Essential (primary) hypertension: Secondary | ICD-10-CM

## 2018-10-25 ENCOUNTER — Encounter (INDEPENDENT_AMBULATORY_CARE_PROVIDER_SITE_OTHER): Payer: Medicare HMO | Admitting: Ophthalmology

## 2018-11-15 ENCOUNTER — Encounter (INDEPENDENT_AMBULATORY_CARE_PROVIDER_SITE_OTHER): Payer: Medicare HMO | Admitting: Ophthalmology

## 2018-11-15 DIAGNOSIS — H35033 Hypertensive retinopathy, bilateral: Secondary | ICD-10-CM

## 2018-11-15 DIAGNOSIS — H35373 Puckering of macula, bilateral: Secondary | ICD-10-CM

## 2018-11-15 DIAGNOSIS — H43813 Vitreous degeneration, bilateral: Secondary | ICD-10-CM | POA: Diagnosis not present

## 2018-11-15 DIAGNOSIS — I10 Essential (primary) hypertension: Secondary | ICD-10-CM | POA: Diagnosis not present

## 2018-11-22 ENCOUNTER — Telehealth: Payer: Self-pay | Admitting: Internal Medicine

## 2018-11-22 NOTE — Telephone Encounter (Signed)
Left message with Dr. West Pugh office to verify the type of eye surgery in order to figure out bleeding risk and clearance

## 2018-11-22 NOTE — Telephone Encounter (Signed)
Received call from Dr. Zigmund Daniel  Surgery is [place] Vitrectomy renal surgery. Non-urgent. Done under general anesthesia Need to hold aspirin or any blood thinner for 5 days Also recent recent cardiac study      Primary Cardiologist:Kenneth C Hilty, MD  Chart reviewed as part of pre-operative protocol coverage. Because of Abukar Harp Brzozowski's past medical history and time since last visit, he/she will require a follow-up visit in order to better assess preoperative cardiovascular risk.  Pre-op covering staff: - Please schedule appointment and call patient to inform them. - Please contact requesting surgeon's office via preferred method (i.e, phone, fax) to inform them of need for appointment prior to surgery.  If applicable, this message will also be routed to pharmacy pool and/or primary cardiologist for input on holding anticoagulant/antiplatelet agent as requested below so that this information is available at time of patient's appointment.   Callback pool, please also find the original preop clearance paper that was dropped off by the patient. It has detailed info on there written by the anesthesiologist. It needs to be given to the provider who sees the patient.   Almyra Deforest, [place]  11/22/2018, 4:37 PM

## 2018-11-22 NOTE — Telephone Encounter (Signed)
Note, patient has not been seen since 2018, if the surgery is cataract surgery, then no need to hold aspirin and patient may be cleared given very low risk nature, however if surgery carries more bleeding risk than that and require him to be off of aspirin, then will need preop followup.

## 2018-11-22 NOTE — Telephone Encounter (Signed)
   [facility] Pre-operative Risk Assessment    Request for surgical clearance:  1. What type of surgery is being performed? Ophthalmic surgery    2. When is this surgery scheduled? TBD   3. What type of clearance is required (medical clearance vs. Pharmacy clearance to hold med vs. Both)? Both   4. Are there any medications that need to be held prior to surgery and how long? Aspirin - 5 days prior    5. Practice name and name of physician performing surgery? Dr. Tempie Hoist ([facility])   6. What is your office phone number [phone]    7.   What is your office fax number [phone] (Attn: Dr. Tempie Hoist)  8.   Anesthesia type (None, local, MAC, general) ? General anesthesia   ** request for most recent cardiac studies, echocardiograms, stress test, cath reports, EKG   Ronald Berger 11/22/2018, 12:32 PM  _________________________________________________________________   (provider comments below)

## 2018-11-23 NOTE — Telephone Encounter (Signed)
Note for upcoming appointment has been updated to reflect PreOp clearance.

## 2018-12-02 ENCOUNTER — Other Ambulatory Visit: Payer: Self-pay

## 2018-12-03 ENCOUNTER — Encounter: Payer: Self-pay | Admitting: Family Medicine

## 2018-12-03 ENCOUNTER — Ambulatory Visit (INDEPENDENT_AMBULATORY_CARE_PROVIDER_SITE_OTHER): Payer: Medicare HMO | Admitting: Family Medicine

## 2018-12-03 VITALS — BP 123/69 | HR 55 | Temp 97.4°F | Ht 69.0 in | Wt 205.0 lb

## 2018-12-03 DIAGNOSIS — K219 Gastro-esophageal reflux disease without esophagitis: Secondary | ICD-10-CM

## 2018-12-03 DIAGNOSIS — J302 Other seasonal allergic rhinitis: Secondary | ICD-10-CM | POA: Diagnosis not present

## 2018-12-03 DIAGNOSIS — R002 Palpitations: Secondary | ICD-10-CM

## 2018-12-03 DIAGNOSIS — G8929 Other chronic pain: Secondary | ICD-10-CM

## 2018-12-03 DIAGNOSIS — G4700 Insomnia, unspecified: Secondary | ICD-10-CM

## 2018-12-03 DIAGNOSIS — E785 Hyperlipidemia, unspecified: Secondary | ICD-10-CM | POA: Diagnosis not present

## 2018-12-03 DIAGNOSIS — Z79899 Other long term (current) drug therapy: Secondary | ICD-10-CM | POA: Diagnosis not present

## 2018-12-03 DIAGNOSIS — G4733 Obstructive sleep apnea (adult) (pediatric): Secondary | ICD-10-CM

## 2018-12-03 DIAGNOSIS — Z23 Encounter for immunization: Secondary | ICD-10-CM | POA: Diagnosis not present

## 2018-12-03 DIAGNOSIS — Z130 Encounter for screening for diseases of the blood and blood-forming organs and certain disorders involving the immune mechanism: Secondary | ICD-10-CM

## 2018-12-03 DIAGNOSIS — M25569 Pain in unspecified knee: Secondary | ICD-10-CM

## 2018-12-03 LAB — CMP14+EGFR
ALT: 29 IU/L (ref 0–44)
AST: 25 IU/L (ref 0–40)
Albumin/Globulin Ratio: 1.7 (ref 1.2–2.2)
Albumin: 4.3 g/dL (ref 3.7–4.7)
Alkaline Phosphatase: 96 IU/L (ref 39–117)
BUN/Creatinine Ratio: 12 (ref 10–24)
BUN: 13 mg/dL (ref 8–27)
Bilirubin Total: 0.6 mg/dL (ref 0.0–1.2)
CO2: 26 mmol/L (ref 20–29)
Calcium: 9.4 mg/dL (ref 8.6–10.2)
Chloride: 101 mmol/L (ref 96–106)
Creatinine, Ser: 1.08 mg/dL (ref 0.76–1.27)
GFR calc Af Amer: 79 mL/min/{1.73_m2} (ref >59)
GFR calc non Af Amer: 69 mL/min/{1.73_m2} (ref >59)
Globulin, Total: 2.5 g/dL (ref 1.5–4.5)
Glucose: 100 mg/dL — ABNORMAL HIGH (ref 65–99)
Potassium: 4.6 mmol/L (ref 3.5–5.2)
Sodium: 139 mmol/L (ref 134–144)
Total Protein: 6.8 g/dL (ref 6.0–8.5)

## 2018-12-03 LAB — LIPID PANEL
Chol/HDL Ratio: 2.7 ratio (ref 0.0–5.0)
Cholesterol, Total: 134 mg/dL (ref 100–199)
HDL: 50 mg/dL (ref >39)
LDL Chol Calc (NIH): 54 mg/dL (ref 0–99)
Triglycerides: 184 mg/dL — ABNORMAL HIGH (ref 0–149)
VLDL Cholesterol Cal: 30 mg/dL (ref 5–40)

## 2018-12-03 LAB — CBC WITH DIFFERENTIAL/PLATELET
Basophils Absolute: 0.1 10*3/uL (ref 0.0–0.2)
Basos: 1 %
EOS (ABSOLUTE): 0.2 10*3/uL (ref 0.0–0.4)
Eos: 2 %
Hematocrit: 45.1 % (ref 37.5–51.0)
Hemoglobin: 15.2 g/dL (ref 13.0–17.7)
Immature Grans (Abs): 0 10*3/uL (ref 0.0–0.1)
Immature Granulocytes: 0 %
Lymphocytes Absolute: 2.8 10*3/uL (ref 0.7–3.1)
Lymphs: 38 %
MCH: 29.2 pg (ref 26.6–33.0)
MCHC: 33.7 g/dL (ref 31.5–35.7)
MCV: 87 fL (ref 79–97)
Monocytes Absolute: 0.6 10*3/uL (ref 0.1–0.9)
Monocytes: 9 %
Neutrophils Absolute: 3.6 10*3/uL (ref 1.4–7.0)
Neutrophils: 50 %
Platelets: 217 10*3/uL (ref 150–450)
RBC: 5.2 x10E6/uL (ref 4.14–5.80)
RDW: 12.5 % (ref 11.6–15.4)
WBC: 7.3 10*3/uL (ref 3.4–10.8)

## 2018-12-03 MED ORDER — TEMAZEPAM 15 MG PO CAPS: 15.0000 mg | ORAL_CAPSULE | Freq: Every evening | ORAL | 2 refills | Status: DC | PRN

## 2018-12-03 MED ORDER — METOPROLOL TARTRATE 25 MG PO TABS: 12.5000 mg | ORAL_TABLET | Freq: Two times a day (BID) | ORAL | 3 refills | Status: DC

## 2018-12-03 MED ORDER — ATORVASTATIN CALCIUM 40 MG PO TABS: 40.0000 mg | ORAL_TABLET | Freq: Every evening | ORAL | 3 refills | Status: DC

## 2018-12-03 MED ORDER — DICLOFENAC SODIUM 1 % EX GEL: 4.0000 g | Freq: Four times a day (QID) | CUTANEOUS | 5 refills | Status: DC

## 2018-12-03 MED ORDER — FLUTICASONE PROPIONATE 50 MCG/ACT NA SUSP: 2.0000 | Freq: Every day | NASAL | 5 refills | Status: DC

## 2018-12-03 MED ORDER — OMEPRAZOLE 20 MG PO CPDR: 20.0000 mg | DELAYED_RELEASE_CAPSULE | Freq: Every evening | ORAL | 3 refills | Status: DC

## 2018-12-03 MED ORDER — TETANUS-DIPHTH-ACELL PERTUSSIS 5-2.5-18.5 LF-MCG/0.5 IM SUSP: 0.5000 mL | Freq: Once | INTRAMUSCULAR | 0 refills | Status: AC

## 2018-12-03 MED ORDER — SHINGRIX 50 MCG/0.5ML IM SUSR: 0.5000 mL | Freq: Once | INTRAMUSCULAR | 0 refills | Status: AC

## 2018-12-03 MED ORDER — LEVOCETIRIZINE DIHYDROCHLORIDE 5 MG PO TABS: 5.0000 mg | ORAL_TABLET | Freq: Every evening | ORAL | 3 refills | Status: DC

## 2018-12-03 NOTE — Progress Notes (Signed)
New Patient Office Visit  Assessment & Plan:  1. Insomnia, unspecified type - Patient is going to switch from Ambien 5 mg to temazepam 15 mg to help with sleep. - temazepam (RESTORIL) 15 MG capsule; Take 1 capsule (15 mg total) by mouth at bedtime as needed for sleep.  Dispense: 30 capsule; Refill: 2 - DRUG SCREEN-TOXASSURE  2. Controlled substance agreement signed - Signed for temazepam. - DRUG SCREEN-TOXASSURE  3. Seasonal allergies - Well controlled on current regimen.  - fluticasone (FLONASE) 50 MCG/ACT nasal spray; Place 2 sprays into both nostrils daily.  Dispense: 16 g; Refill: 5 - levocetirizine (XYZAL) 5 MG tablet; Take 1 tablet (5 mg total) by mouth every evening.  Dispense: 90 tablet; Refill: 3  4. Hyperlipidemia LDL goal <70 - Well controlled on current regimen.  - atorvastatin (LIPITOR) 40 MG tablet; Take 1 tablet (40 mg total) by mouth every evening.  Dispense: 90 tablet; Refill: 3 - CMP14+EGFR - Lipid Panel  5. Gastroesophageal reflux disease, unspecified whether esophagitis present - Well controlled on current regimen.  - omeprazole (PRILOSEC) 20 MG capsule; Take 1 capsule (20 mg total) by mouth every evening.  Dispense: 90 capsule; Refill: 3 - CMP14+EGFR  6. Palpitations - Well controlled on current regimen.  - metoprolol tartrate (LOPRESSOR) 25 MG tablet; Take 0.5 tablets (12.5 mg total) by mouth 2 (two) times daily.  Dispense: 90 tablet; Refill: 3 - CMP14+EGFR  7. Chronic knee pain, unspecified laterality - Well controlled on current regimen.  - diclofenac Sodium (VOLTAREN) 1 % GEL; Apply 4 g topically 4 (four) times daily.  Dispense: 350 g; Refill: 5  8. OSA (obstructive sleep apnea) - Does not tolerate CPAP.   9. Immunization due - Tdap (BOOSTRIX) 5-2.5-18.5 LF-MCG/0.5 injection; Inject 0.5 mLs into the muscle once for 1 dose.  Dispense: 0.5 mL; Refill: 0 - SHINGRIX injection; Inject 0.5 mLs into the muscle once for 1 dose.  Dispense: 0.5 mL; Refill:  0  10. Need for immunization against influenza - Flu Vaccine QUAD High Dose(Fluad)  11. Screening for deficiency anemia - CBC with Differential/Platelet   Follow-up: Return in about 6 months (around 06/02/2019) for follow-up of chronic medication conditions.   Hendricks Limes, MSN, APRN, FNP-C [facility]  Subjective:  Patient ID: Ronald Berger, male    DOB: 04-Jan-1946  Age: 73 y.o. MRN: [id number]  Patient Care Team: Loman Brooklyn, FNP as PCP - General (Family Medicine) Debara Pickett Nadean Corwin, MD as PCP - Cardiology (Cardiology)  CC:  Chief Complaint  Patient presents with  . New Patient (Initial Visit)    HPI EDI GORNIAK presents to establish care.  He is transferring care from [place] [facility].    Patient has sleep apnea but is unable to tolerate a CPAP.  Patient has seasonal allergies for which he takes levocetirizine 5 mg once daily and Flonase.  Acid reflux controlled with omeprazole 20 mg once daily.  Patient has insomnia for which he was previously taking Ambien 10 mg at at bedtime.  His previous PCP reduced him to 5 mg due to his age.  He reports he has not been sleeping well since this change was made but he understands why it was made.  He is interested in alternatives.   Review of Systems  Constitutional: Negative for chills, fever, malaise/fatigue and weight loss.  HENT: Negative for congestion, ear discharge, ear pain, nosebleeds, sinus pain, sore throat and tinnitus.   Eyes: Negative for blurred  vision, double vision, pain, discharge and redness.  Respiratory: Negative for cough, shortness of breath and wheezing.   Cardiovascular: Negative for chest pain, palpitations and leg swelling.  Gastrointestinal: Negative for abdominal pain, constipation, diarrhea, heartburn, nausea and vomiting.  Genitourinary: Negative for dysuria, frequency and urgency.  Musculoskeletal: Positive for joint pain. Negative for myalgias.  Skin:  Negative for rash.  Neurological: Negative for dizziness, seizures, weakness and headaches.  Psychiatric/Behavioral: Negative for depression, substance abuse and suicidal ideas. The patient has insomnia. The patient is not nervous/anxious.     Current Outpatient Medications:  .  aspirin 81 MG chewable tablet, Chew 1 tablet (81 mg total) by mouth daily., Disp: , Rfl:  .  atorvastatin (LIPITOR) 40 MG tablet, Take 1 tablet (40 mg total) by mouth every evening., Disp: 90 tablet, Rfl: 3 .  diclofenac Sodium (VOLTAREN) 1 % GEL, Apply 4 g topically 4 (four) times daily., Disp: 350 g, Rfl: 5 .  fluticasone (FLONASE) 50 MCG/ACT nasal spray, Place 2 sprays into both nostrils daily., Disp: 16 g, Rfl: 5 .  levocetirizine (XYZAL) 5 MG tablet, Take 1 tablet (5 mg total) by mouth every evening., Disp: 90 tablet, Rfl: 3 .  metoprolol tartrate (LOPRESSOR) 25 MG tablet, Take 0.5 tablets (12.5 mg total) by mouth 2 (two) times daily., Disp: 90 tablet, Rfl: 3 .  nitroGLYCERIN (NITROSTAT) 0.4 MG SL tablet, Place 1 tablet (0.4 mg total) under the tongue every 5 (five) minutes as needed for chest pain., Disp: 25 tablet, Rfl: 12 .  omeprazole (PRILOSEC) 20 MG capsule, Take 1 capsule (20 mg total) by mouth every evening., Disp: 90 capsule, Rfl: 3 .  temazepam (RESTORIL) 15 MG capsule, Take 1 capsule (15 mg total) by mouth at bedtime as needed for sleep., Disp: 30 capsule, Rfl: 2  Allergies  Allergen Reactions  . Oxycodone Nausea And Vomiting    Past Medical History:  Diagnosis Date  . Allergic rhinitis   . 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  . 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 NSTEMI 07/2013 - DES to mLCx, DES to prox OM1, DES to mLAD with nonobstructive RCA stenosis, EF 55-65%.  . Dyslipidemia   . GERD (gastroesophageal reflux disease)   . History of kidney stones   . History of non-ST elevation myocardial  infarction (NSTEMI)    07/ 2015  S/P  DES X3  . History of palpitations   . Hyperlipidemia   . Hypertension   . OSA (obstructive sleep apnea)    cpap intolerant  . Right knee meniscal tear   . S/P drug eluting coronary stent placement    07/ 2015  x3  to mLCFX, OM1, mLAD  . Sinus bradycardia     Past Surgical History:  Procedure Laterality Date  . EXTRACORPOREAL SHOCK WAVE LITHOTRIPSY  x2 last one 2005  . KNEE ARTHROSCOPY Right 05/02/2015   Procedure: RIGHT KNEE ARTHROSCOPY WITH medial meniscal DEBRIDEMENT ;  Surgeon: Gaynelle Arabian, MD;  [facility];  Service: Orthopedics;  Laterality: Right;  . LEFT HEART CATHETERIZATION WITH CORONARY ANGIOGRAM N/A 08/05/2013   Procedure: LEFT HEART CATHETERIZATION WITH CORONARY ANGIOGRAM;  Surgeon: Blane Ohara, MD;  Location: [facility] CATH LAB;  Service: Cardiovascular;  Laterality: N/A;  Promus DES's to  mLCFx,  OM1,  mLAD (total 3)/   mRCA 30%,  dLM  20-30%,  preserved LVSF, ef 55-65%  .  LUMBAR SPINE SURGERY  x3  last one 1980's  . SHOULDER ARTHROSCOPY Right 2013    Family History  Problem Relation Age of Onset  . Coronary artery disease Mother        CABG  . CAD Sister   . Alzheimer's disease Father   . Aneurysm Brother        brain  . Lung cancer Sister   . Bone cancer Sister     Social History   Socioeconomic History  . Marital status: Married    Spouse name: Not on file  . Number of children: Not on file  . Years of education: Not on file  . Highest education level: Not on file  Occupational History    Comment: Retired - prior [employer]  . Financial resource strain: Not on file  . Food insecurity    Worry: Not on file    Inability: Not on file  . Transportation needs    Medical: Not on file    Non-medical: Not on file  Tobacco Use  . Smoking status: Former Smoker    Years: 1.00    Types: Cigarettes    Quit date: 04/27/1966    Years since quitting: 52.6  . Smokeless tobacco: Never  Used  Substance and Sexual Activity  . Alcohol use: Yes    Comment: occasional  . Drug use: No  . Sexual activity: Not on file  Lifestyle  . Physical activity    Days per week: Not on file    Minutes per session: Not on file  . Stress: Not on file  Relationships  . Social Herbalist on phone: Not on file    Gets together: Not on file    Attends religious service: Not on file    Active member of club or organization: Not on file    Attends meetings of clubs or organizations: Not on file    Relationship status: Not on file  . Intimate partner violence    Fear of current or ex partner: Not on file    Emotionally abused: Not on file    Physically abused: Not on file    Forced sexual activity: Not on file  Other Topics Concern  . Not on file  Social History Narrative  . Not on file    Objective:   Today's Vitals: BP 123/69   Pulse (!) 55   Temp (!) 97.4 F (36.3 C) (Temporal)   Ht _0  (1.753 m)   Wt 205 lb (93 kg)   SpO2 98%   BMI 30.27 kg/m   Physical Exam Vitals signs reviewed.  Constitutional:      General: He is not in acute distress.    Appearance: Normal appearance. He is obese. He is not ill-appearing, toxic-appearing or diaphoretic.  HENT:     Head: Normocephalic and atraumatic.  Eyes:     General: No scleral icterus.       Right eye: No discharge.        Left eye: No discharge.     Conjunctiva/sclera: Conjunctivae normal.  Neck:     Musculoskeletal: Normal range of motion.  Cardiovascular:     Rate and Rhythm: Normal rate and regular rhythm.     Heart sounds: Normal heart sounds. No murmur. No friction rub. No gallop.   Pulmonary:     Effort: Pulmonary effort is normal. No respiratory distress.     Breath sounds: Normal breath sounds. No stridor. No wheezing,  rhonchi or rales.  Musculoskeletal: Normal range of motion.  Skin:    General: Skin is warm and dry.  Neurological:     Mental Status: He is alert and oriented to person, place,  and time. Mental status is at baseline.  Psychiatric:        Mood and Affect: Mood normal.        Behavior: Behavior normal.        Thought Content: Thought content normal.        Judgment: Judgment normal.

## 2018-12-08 ENCOUNTER — Encounter: Payer: Self-pay | Admitting: Family Medicine

## 2018-12-08 DIAGNOSIS — D369 Benign neoplasm, unspecified site: Secondary | ICD-10-CM | POA: Insufficient documentation

## 2018-12-08 DIAGNOSIS — G4700 Insomnia, unspecified: Secondary | ICD-10-CM

## 2018-12-08 DIAGNOSIS — G5603 Carpal tunnel syndrome, bilateral upper limbs: Secondary | ICD-10-CM

## 2018-12-08 HISTORY — DX: Insomnia, unspecified: G47.00

## 2018-12-08 HISTORY — DX: Carpal tunnel syndrome, bilateral upper limbs: G56.03

## 2018-12-09 LAB — TOXASSURE SELECT 13 (MW), URINE

## 2018-12-10 ENCOUNTER — Telehealth: Payer: Self-pay | Admitting: Family Medicine

## 2018-12-10 DIAGNOSIS — Z1211 Encounter for screening for malignant neoplasm of colon: Secondary | ICD-10-CM

## 2018-12-10 NOTE — Telephone Encounter (Signed)
Patient called stating that he was told at his last visit with Hendricks Limes, for him to decide which office he wanted to be referred to, to have his Colonoscopy. Patient said he would like to be referred to [facility].

## 2018-12-14 ENCOUNTER — Encounter: Payer: Self-pay | Admitting: Gastroenterology

## 2018-12-20 ENCOUNTER — Ambulatory Visit: Payer: Medicare HMO | Admitting: Internal Medicine

## 2019-01-06 ENCOUNTER — Telehealth: Payer: Self-pay | Admitting: *Deleted

## 2019-01-06 NOTE — Telephone Encounter (Signed)
Patient no show PV today. Called patient no answer, left message for the patient to call me back before 5 pm to reschedule the nurse visit. If he does not call back today PV and colonoscopy appointments will be cancelled and NO show letter will be mailed.

## 2019-01-20 ENCOUNTER — Encounter: Payer: Medicare HMO | Admitting: Gastroenterology

## 2019-01-27 ENCOUNTER — Ambulatory Visit (AMBULATORY_SURGERY_CENTER): Payer: Medicare HMO | Admitting: *Deleted

## 2019-01-27 ENCOUNTER — Other Ambulatory Visit: Payer: Self-pay

## 2019-01-27 VITALS — Temp 96.9°F | Ht 69.0 in | Wt 210.0 lb

## 2019-01-27 DIAGNOSIS — Z1211 Encounter for screening for malignant neoplasm of colon: Secondary | ICD-10-CM

## 2019-01-27 DIAGNOSIS — Z1159 Encounter for screening for other viral diseases: Secondary | ICD-10-CM

## 2019-01-27 MED ORDER — SUPREP BOWEL PREP KIT 17.5-3.13-1.6 GM/177ML PO SOLN: 1.0000 | Freq: Once | ORAL | 0 refills | Status: AC

## 2019-01-27 NOTE — Progress Notes (Signed)

## 2019-01-28 ENCOUNTER — Ambulatory Visit: Payer: Medicare HMO | Admitting: Internal Medicine

## 2019-01-31 ENCOUNTER — Encounter: Payer: Self-pay | Admitting: Internal Medicine

## 2019-01-31 ENCOUNTER — Other Ambulatory Visit: Payer: Self-pay

## 2019-01-31 ENCOUNTER — Ambulatory Visit (INDEPENDENT_AMBULATORY_CARE_PROVIDER_SITE_OTHER): Payer: Medicare HMO | Admitting: Internal Medicine

## 2019-01-31 VITALS — BP 148/84 | HR 68 | Temp 97.2°F | Ht 69.0 in | Wt 217.0 lb

## 2019-01-31 DIAGNOSIS — E782 Mixed hyperlipidemia: Secondary | ICD-10-CM | POA: Diagnosis not present

## 2019-01-31 DIAGNOSIS — R002 Palpitations: Secondary | ICD-10-CM | POA: Diagnosis not present

## 2019-01-31 DIAGNOSIS — I2510 Atherosclerotic heart disease of native coronary artery without angina pectoris: Secondary | ICD-10-CM

## 2019-01-31 NOTE — Progress Notes (Signed)
Marland Kitchen    OFFICE NOTE  Chief Complaint:  No complaints  Primary Care Physician: Loman Brooklyn, FNP  HPI:  ELDEN AN is a 74 y/o M with history of CAD (NSTEMI 07/2013 s/p DES to mLCx/OM1/mLAD with nonobstructive RCA disease, normal EF), HLD, and GERD. He has done well since his PCI. He has been participating regularly in cardiac rehab without any recent exertional symptoms. He has been compliant with medication. He was recently seen in the ER for sharp chest discomfort slightly to the left of his sternum. It does not radiate. He denies any associated SOB, nausea, vomiting, diaphoresis, palpitations, near-syncope, syncope or bleeding. The pain is not similar to his prior angina (that was more pressure-like and with associated sx). It is worse with palpation. It is not usually worse with exertion but has been on a few occasions. Pain is not worse with inspiration or recumbency. He took Tums and several SL NTG without any change in discomfort. The discomfort has tended to come on in the morning, last all day long, then resolve at night when lying down. He walks his dog 3-4 times a day and wonders if walking her in the snow (which was more difficult) could've led to some shoulder straining. No recent travel, surgery, bedrest, LEE, or orthopnea. His wife is a Marine scientist and urged him to get checked out. In the ED, VSS except mild sinus bradycardia, not tachycardic, tachypneic or hypoxic. Normal CBC, BMET, troponin x 1. CXR no edema or consolidation. He was admitted for observation and treated with IV ketorolac with improvement in symptoms. He remained CP free. Troponins remained negative x 5 and labs remained stable. TSH 4.2. He did have some one episode of sinus bradycardia overnight in the low 40's while sleeping (asymptomatic). He does admit to a h/o OSA but has not been able to tolerate CPAP. He denies any dizziness, near-syncope, syncope or any symptoms of bradycardia. LDL remains at goal at 40.  Mr.  Marsden returns today for follow-up. He denies any chest pain or worsening shortness of breath. He is resume cardiac rehabilitation and doing well. He will need to continue on Prasugrel at least until this summer.  I saw Mr. Troutman in the office today. Recently he called in as he woke up one morning and was noted to have a low heart rate and some palpitations. Apparently his wife listened to him who is a Marine scientist and thought he may have A. fib. Ultimately came in for an EKG which was normal. He has not had any reoccurrence. We contemplated a monitor however since he's not had recurrence at this point I don't think there is evidence to monitor him as he has another episode. Were also seeing him today as it's been 1 year since his prior stent. I believe at this point he could come off of Effient. He is not having any chest pain and is physically active.  08/29/2015  Mr. Demary was seen today in the office in follow-up. Over the past year he denies any cardiac complaints such as chest pain or worsening shortness of breath. Unfortunately recent knee injury and underwent the surgery which was arthroscopic in April. He still recovering from that. [name] amount of weight gain. Despite this recent cholesterol profile in June showed total cholesterol 111, triglycerides 84, HDL 53 and LDL 41. This represents good control. Heart rate remains slow around 50 on low-dose metoprolol, but he is asymptomatic with this. He does not appreciate any significant palpitations although  they are picked up by his wife is a Marine scientist.  08/28/2016  Mr. Luetkemeyer returns today for follow-up. Is been exactly one year since I last saw him. He denies any chest pain or worsening shortness of breath. 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 Cholesterol is very well controlled. Blood pressure is at goal. He denies any recurrent chest pain.  01/31/2019  Mr. Sandra is seen today in follow-up.  He is doing well  denies any chest pain or worsening shortness of breath.  He does report some mild increase in fatigue but he says this has been slowly developing.  He also thinks it may have been because he took 5 weeks off of work and is now getting back [place] the park.  He is pending eye surgery and also is having elective colonoscopy in the near future.  I have no hesitation for him to come off his aspirin if necessary.  Labs a month ago showed total cholesterol 134, triglycerides 184, HDL 50 and LDL 54, however this was nonfasting.  PMHx:  Past Medical History:  Diagnosis Date  . Allergic rhinitis   . Allergy   . 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  . Bilateral carpal tunnel syndrome 12/08/2018  . CAD, multiple vessel CARDIOLOGIST-  DR Remingtyn Depaola   a. NSTEMI 07/2013 - DES to mLCx, DES to prox OM1, DES to mLAD with nonobstructive RCA stenosis, EF 55-65%.  . Chronic kidney disease    kidney stones   . Dyslipidemia   . GERD (gastroesophageal reflux disease)   . History of kidney stones   . History of non-ST elevation myocardial infarction (NSTEMI)    07/ 2015  S/P  DES X3  . History of palpitations   . Hyperlipidemia   . Hypertension   . Insomnia 12/08/2018   x many years  . Myocardial infarction ([place]) 2015   NSTEMI- 3 stents   . OSA (obstructive sleep apnea)    cpap intolerant  . Right knee meniscal tear   . S/P drug eluting coronary stent placement    07/ 2015  x3  to mLCFX, OM1, mLAD  . Sinus bradycardia   . Sleep apnea    no cpap     Past Surgical History:  Procedure Laterality Date  . CATARACT EXTRACTION, BILATERAL    . COLONOSCOPY     >10 yrs ago-polyps per pt- we have no report   . EXTRACORPOREAL SHOCK WAVE LITHOTRIPSY  x2 last one 2005  . KNEE ARTHROSCOPY Right 05/02/2015   Procedure: RIGHT KNEE ARTHROSCOPY WITH medial meniscal DEBRIDEMENT ;  Surgeon: Gaynelle Arabian, MD;  Location: [facility];  Service: Orthopedics;  Laterality: Right;  . LEFT HEART CATHETERIZATION WITH CORONARY ANGIOGRAM N/A 08/05/2013   Procedure: LEFT HEART CATHETERIZATION WITH CORONARY ANGIOGRAM;  Surgeon: Blane Ohara, MD;  Location: [facility] CATH LAB;  Service: Cardiovascular;  Laterality: N/A;  Promus DES's to  mLCFx,  OM1,  mLAD (total 3)/   mRCA 30%,  dLM  20-30%,  preserved LVSF, ef 55-65%  . LUMBAR SPINE SURGERY  x3  last one 1980's  . SHOULDER ARTHROSCOPY Right 2013    FAMHx:  Family History  Problem Relation Age of Onset  . Coronary artery disease Mother        CABG  . CAD Sister   . Bone cancer Sister   .  Alzheimer's disease Father   . Aneurysm Brother        brain  . Lung cancer Sister   . Bone cancer Sister   . Colon cancer Neg Hx   . Colon polyps Neg Hx   . Esophageal cancer Neg Hx   . Rectal cancer Neg Hx   . Stomach cancer Neg Hx     SOCHx:   reports that he quit smoking about 52 years ago. His smoking use included cigarettes. He quit after 1.00 year of use. He has never used smokeless tobacco. He reports current alcohol use. He reports that he does not use drugs.  ALLERGIES:  Allergies  Allergen Reactions  . Oxycodone Nausea And Vomiting    ROS: Pertinent items noted in HPI and remainder of comprehensive ROS otherwise negative.  HOME MEDS: Current Outpatient Medications  Medication Sig Dispense Refill  . aspirin 81 MG chewable tablet Chew 1 tablet (81 mg total) by mouth daily.    Marland Kitchen atorvastatin (LIPITOR) 40 MG tablet Take 1 tablet (40 mg total) by mouth every evening. 90 tablet 3  . diclofenac Sodium (VOLTAREN) 1 % GEL Apply 4 g topically 4 (four) times daily. 350 g 5  . fluticasone (FLONASE) 50 MCG/ACT nasal spray Place 2 sprays into both nostrils daily. 16 g 5  . ibuprofen (ADVIL) 200 MG tablet Take 200 mg by mouth every 6 (six) hours as needed.    Marland Kitchen levocetirizine (XYZAL) 5 MG tablet Take 1 tablet (5 mg total) by mouth every evening. 90 tablet 3  .  metoprolol tartrate (LOPRESSOR) 25 MG tablet Take 0.5 tablets (12.5 mg total) by mouth 2 (two) times daily. 90 tablet 3  . nitroGLYCERIN (NITROSTAT) 0.4 MG SL tablet Place 1 tablet (0.4 mg total) under the tongue every 5 (five) minutes as needed for chest pain. 25 tablet 12  . omeprazole (PRILOSEC) 20 MG capsule Take 1 capsule (20 mg total) by mouth every evening. 90 capsule 3  . temazepam (RESTORIL) 15 MG capsule Take 1 capsule (15 mg total) by mouth at bedtime as needed for sleep. 30 capsule 2   No current facility-administered medications for this visit.    LABS/IMAGING: No results found for this or any previous visit (from the past 48 hour(s)). No results found.  VITALS: BP (!) 148/84 (BP Location: Left Arm, Patient Position: Sitting, Cuff Size: Normal)   Pulse 68   Temp (!) 97.2 F (36.2 C)   Ht 5\' 9"  (1.753 m)   Wt 217 lb (98.4 kg)   BMI 32.05 kg/m   EXAM: General appearance: alert and no distress Neck: no carotid bruit and no JVD Lungs: clear to auscultation bilaterally Heart: regular rate and rhythm, S1, S2 normal, no murmur, click, rub or gallop Abdomen: soft, non-tender; bowel sounds normal; no masses,  no organomegaly Extremities: extremities normal, atraumatic, no cyanosis or edema Pulses: 2+ and symmetric Skin: Skin color, texture, turgor normal. No rashes or lesions Neurologic: Grossly normal Psych: Pleasant  EKG: Normal sinus rhythm 68-personally reviewed  ASSESSMENT: 1. Coronary artery disease with NSTEMI (PCI to mLCX, OM1 and mLAD-07/2013) 2. Dyslipidemia 3. GERD 4. Palpitations - rare  PLAN: 1.   Mr. Helman feels well denies chest pain or worsening shortness of breath.  He has had no recurrent palpitations.  His LDL is at goal less than 70.  Denies any further chest pain.  We will plan a follow-up annually or sooner as necessary.  Pixie Casino, MD, [facility], [place]  [facility] of the [facility] Lipid [facility] &   Cardiovascular Risk [facility] Diplomate of the American Board of Clinical Lipidology Attending Cardiologist  Direct Dial: [phone]  Fax: [phone]  Website:  [email or web address] 01/31/2019, 4:11 PM

## 2019-01-31 NOTE — Patient Instructions (Signed)
Medication Instructions:  NO CHANGES *If you need a refill on your cardiac medications before your next appointment, please call your pharmacy*  Lab Work: If you have labs (blood work) drawn today and your tests are completely normal, you will receive your results only by: Marland Kitchen MyChart Message (if you have MyChart) OR . A paper copy in the mail If you have any lab test that is abnormal or we need to change your treatment, we will call you to review the results.  Follow-Up: At [facility], you and your health needs are our priority.  As part of our continuing mission to provide you with exceptional heart care, we have created designated Provider Care Teams.  These Care Teams include your primary Cardiologist (physician) and Advanced Practice Providers (APPs -  Physician Assistants and Nurse Practitioners) who all work together to provide you with the care you need, when you need it.  Your next appointment:   12 month(s)  The format for your next appointment:   Either In Person or Virtual  Provider:   Raliegh Ip Mali Hilty, MD

## 2019-02-07 ENCOUNTER — Other Ambulatory Visit: Payer: Self-pay

## 2019-02-07 ENCOUNTER — Other Ambulatory Visit (HOSPITAL_COMMUNITY)
Admission: RE | Admit: 2019-02-07 | Discharge: 2019-02-07 | Disposition: A | Discharge status: Home / Self Care | Payer: Medicare HMO | Source: Ambulatory Visit | Attending: Gastroenterology | Admitting: Gastroenterology

## 2019-02-07 DIAGNOSIS — Z20822 Contact with and (suspected) exposure to covid-19: Secondary | ICD-10-CM | POA: Insufficient documentation

## 2019-02-07 DIAGNOSIS — Z01812 Encounter for preprocedural laboratory examination: Secondary | ICD-10-CM | POA: Diagnosis present

## 2019-02-07 LAB — SARS CORONAVIRUS 2 (TAT 6-24 HRS): SARS Coronavirus 2: NEGATIVE

## 2019-02-09 ENCOUNTER — Encounter: Payer: Medicare HMO | Admitting: Gastroenterology

## 2019-02-10 ENCOUNTER — Ambulatory Visit (AMBULATORY_SURGERY_CENTER): Payer: Medicare HMO | Admitting: Gastroenterology

## 2019-02-10 ENCOUNTER — Other Ambulatory Visit: Payer: Self-pay

## 2019-02-10 ENCOUNTER — Encounter: Payer: Self-pay | Admitting: Gastroenterology

## 2019-02-10 VITALS — BP 127/78 | HR 55 | Temp 98.3°F | Resp 16 | Ht 69.0 in | Wt 210.0 lb

## 2019-02-10 DIAGNOSIS — Z1211 Encounter for screening for malignant neoplasm of colon: Secondary | ICD-10-CM | POA: Diagnosis not present

## 2019-02-10 DIAGNOSIS — D123 Benign neoplasm of transverse colon: Secondary | ICD-10-CM | POA: Diagnosis not present

## 2019-02-10 DIAGNOSIS — D122 Benign neoplasm of ascending colon: Secondary | ICD-10-CM

## 2019-02-10 DIAGNOSIS — D125 Benign neoplasm of sigmoid colon: Secondary | ICD-10-CM

## 2019-02-10 DIAGNOSIS — D120 Benign neoplasm of cecum: Secondary | ICD-10-CM

## 2019-02-10 DIAGNOSIS — D128 Benign neoplasm of rectum: Secondary | ICD-10-CM

## 2019-02-10 MED ORDER — SODIUM CHLORIDE 0.9 % IV SOLN: 500.0000 mL | Freq: Once | INTRAVENOUS | Status: DC

## 2019-02-10 NOTE — Progress Notes (Signed)
Called to room to assist during endoscopic procedure.  Patient ID and intended procedure confirmed with present staff. Received instructions for my participation in the procedure from the performing physician.  

## 2019-02-10 NOTE — Progress Notes (Signed)
Vitals-DT Temp-JB  Pt's states no medical or surgical changes since previsit or office visit. 

## 2019-02-10 NOTE — Op Note (Signed)
[facility] Patient Name: Ronald Berger Procedure Date: 02/10/2019 10:24 AM MRN: [id number] Endoscopist: Thornton Park MD, MD Age: 74 Referring MD:  Date of Birth: 11/20/1945 Gender: Male Account #: [id number] Procedure:                Colonoscopy Indications:              Screening for colorectal malignant neoplasm                           Patient reports a colonoscopy 10 years ago in                            [place]. Polyps were removed.                           No known family history of colon cancer or polyps. Medicines:                Monitored Anesthesia Care Procedure:                Pre-Anesthesia Assessment:                           - Prior to the procedure, a History and Physical                            was performed, and patient medications and                            allergies were reviewed. The patient's tolerance of                            previous anesthesia was also reviewed. The risks                            and benefits of the procedure and the sedation                            options and risks were discussed with the patient.                            All questions were answered, and informed consent                            was obtained. Prior Anticoagulants: The patient has                            taken no previous anticoagulant or antiplatelet                            agents. ASA Grade Assessment: III - A patient with                            severe systemic disease. After reviewing the risks  and benefits, the patient was deemed in                            satisfactory condition to undergo the procedure.                           After obtaining informed consent, the colonoscope                            was passed under direct vision. Throughout the                            procedure, the patient's blood pressure, pulse, and                            oxygen saturations were monitored  continuously. The                            Colonoscope was introduced through the anus and                            advanced to the 3 cm into the ileum. A second                            forward view of the right colon was performed. The                            colonoscopy was performed without difficulty. The                            patient tolerated the procedure well. The quality                            of the bowel preparation was good. The terminal                            ileum, ileocecal valve, appendiceal orifice, and                            rectum were photographed. Scope In: 10:29:01 AM Scope Out: 10:48:28 AM Scope Withdrawal Time: 0 hours 17 minutes 19 seconds  Total Procedure Duration: 0 hours 19 minutes 27 seconds  Findings:                 Hemorrhoids were found on perianal exam.                           Non-bleeding internal hemorrhoids were found. The                            hemorrhoids were small.                           Ten sessile polyps were found in the rectum (1),  transverse colon (1), hepatic flexure (2),                            ascending colon (4) and cecum (20. The polyps were                            <1 to 1 mm in size. These polyps were removed with                            a cold snare. Resection and retrieval were                            complete. Estimated blood loss was minimal.                           The exam was otherwise without abnormality on                            direct and retroflexion views. Complications:            No immediate complications. Estimated blood loss:                            Minimal. Estimated Blood Loss:     Estimated blood loss was minimal. Impression:               - Hemorrhoids found on perianal exam.                           - Non-bleeding internal hemorrhoids.                           - Ten <1 to 1 mm polyps in the rectum, in the                             transverse colon, at the hepatic flexure, in the                            ascending colon and in the cecum, removed with a                            cold snare. Resected and retrieved.                           - The examination was otherwise normal on direct                            and retroflexion views. Recommendation:           - Patient has a contact number available for                            emergencies. The signs and symptoms of potential  delayed complications were discussed with the                            patient. Return to normal activities tomorrow.                            Written discharge instructions were provided to the                            patient.                           - Resume previous diet.                           - Continue present medications.                           - Await pathology results.                           - Repeat colonoscopy date to be determined after                            pending pathology results are reviewed for                            surveillance. Thornton Park MD, MD 02/10/2019 10:57:58 AM This report has been signed electronically.

## 2019-02-10 NOTE — Progress Notes (Signed)
To PACU, VSS. Report to [email or web address] 

## 2019-02-10 NOTE — Patient Instructions (Signed)
HANDOUTS PROVIDED ON: POLYPS & HEMORRHOIDS  The polyps removed today have been sent for pathology.  The results can take 1-3 weeks to receive.  When your next colonoscopy should occur will be based on the pathology results.    You may resume your previous diet and medication schedule.  Thank you for allowing [place] to care for you today!!!  YOU HAD AN ENDOSCOPIC PROCEDURE TODAY AT [place]:   Refer to the procedure report that was given to you for any specific questions about what was found during the examination.  If the procedure report does not answer your questions, please call your gastroenterologist to clarify.  If you requested that your care partner not be given the details of your procedure findings, then the procedure report has been included in a sealed envelope for you to review at your convenience later.  YOU SHOULD EXPECT: Some feelings of bloating in the abdomen. Passage of more gas than usual.  Walking can help get rid of the air that was put into your GI tract during the procedure and reduce the bloating. If you had a lower endoscopy (such as a colonoscopy or flexible sigmoidoscopy) you may notice spotting of blood in your stool or on the toilet paper. If you underwent a bowel prep for your procedure, you may not have a normal bowel movement for a few days.  Please Note:  You might notice some irritation and congestion in your nose or some drainage.  This is from the oxygen used during your procedure.  There is no need for concern and it should clear up in a day or so.  SYMPTOMS TO REPORT IMMEDIATELY:   Following lower endoscopy (colonoscopy or flexible sigmoidoscopy):  Excessive amounts of blood in the stool  Significant tenderness or worsening of abdominal pains  Swelling of the abdomen that is new, acute  Fever of 100F or higher  For urgent or emergent issues, a gastroenterologist can be reached at any hour by calling [phone].   DIET:  We do  recommend a small meal at first, but then you may proceed to your regular diet.  Drink plenty of fluids but you should avoid alcoholic beverages for 24 hours.  ACTIVITY:  You should plan to take it easy for the rest of today and you should NOT DRIVE or use heavy machinery until tomorrow (because of the sedation medicines used during the test).    FOLLOW UP: Our staff will call the number listed on your records 48-72 hours following your procedure to check on you and address any questions or concerns that you may have regarding the information given to you following your procedure. If we do not reach you, we will leave a message.  We will attempt to reach you two times.  During this call, we will ask if you have developed any symptoms of COVID 19. If you develop any symptoms (ie: fever, flu-like symptoms, shortness of breath, cough etc.) before then, please call [phone].  If you test positive for Covid 19 in the 2 weeks post procedure, please call and report this information to [place].    If any biopsies were taken you will be contacted by phone or by letter within the next 1-3 weeks.  Please call us at [phone] if you have not heard about the biopsies in 3 weeks.    SIGNATURES/CONFIDENTIALITY: You and/or your care partner have signed paperwork which will be entered into your electronic medical record.  These signatures attest  to the fact that that the information above on your After Visit Summary has been reviewed and is understood.  Full responsibility of the confidentiality of this discharge information lies with you and/or your care-partner.

## 2019-02-14 ENCOUNTER — Telehealth: Payer: Self-pay

## 2019-02-14 NOTE — Telephone Encounter (Signed)
  Follow up Call-  Call back number 02/10/2019  Post procedure Call Back phone  # [phone]  Permission to leave phone message Yes  Some recent data might be hidden     Patient questions:  Do you have a fever, pain , or abdominal swelling? No. Pain Score  0 *  Have you tolerated food without any problems? Yes.    Have you been able to return to your normal activities? Yes.    Do you have any questions about your discharge instructions: Diet   No. Medications  No. Follow up visit  No.  Do you have questions or concerns about your Care? No.  Actions: * If pain score is 4 or above: No action needed, pain <4.    1. Have you developed a fever since your procedure? No  2.   Have you had an respiratory symptoms (SOB or cough) since your procedure? No  3.   Have you tested positive for COVID 19 since your procedure No  4.   Have you had any family members/close contacts diagnosed with the COVID 19 since your procedure?  No   If yes to any of these questions please route to Joylene John, RN and Alphonsa Gin, RN.

## 2019-02-15 ENCOUNTER — Encounter: Payer: Self-pay | Admitting: Gastroenterology

## 2019-02-15 ENCOUNTER — Other Ambulatory Visit: Payer: Self-pay | Admitting: *Deleted

## 2019-02-15 DIAGNOSIS — Z86010 Personal history of colonic polyps: Secondary | ICD-10-CM

## 2019-02-17 ENCOUNTER — Telehealth: Payer: Self-pay | Admitting: Licensed Clinical Social Worker

## 2019-02-17 NOTE — Telephone Encounter (Signed)
Received a genetic counseling referral from [facility] for hx of colon polyps. Ronald Berger has been cld and scheduled to see Faith Rogue for an in person visit on 2/8 at 3pm. Pt doesn't have access to a camera. He's been made aware to arrive 15 minutes early.

## 2019-02-28 ENCOUNTER — Inpatient Hospital Stay: Payer: Medicare HMO | Attending: Genetic Counselor | Admitting: Licensed Clinical Social Worker

## 2019-02-28 ENCOUNTER — Inpatient Hospital Stay: Payer: Medicare HMO

## 2019-02-28 ENCOUNTER — Other Ambulatory Visit: Payer: Self-pay

## 2019-02-28 ENCOUNTER — Encounter: Payer: Self-pay | Admitting: Licensed Clinical Social Worker

## 2019-02-28 ENCOUNTER — Other Ambulatory Visit: Payer: Self-pay | Admitting: Licensed Clinical Social Worker

## 2019-02-28 DIAGNOSIS — D369 Benign neoplasm, unspecified site: Secondary | ICD-10-CM | POA: Diagnosis not present

## 2019-02-28 DIAGNOSIS — Z808 Family history of malignant neoplasm of other organs or systems: Secondary | ICD-10-CM

## 2019-02-28 NOTE — Progress Notes (Signed)
REFERRING PROVIDER: Thornton Park, MD [place],  [place] [address]  PRIMARY PROVIDER:  Loman Brooklyn, FNP  PRIMARY REASON FOR VISIT:  1. Adenomatous polyps   2. Family history of bone cancer      HISTORY OF PRESENT ILLNESS:   Ronald Berger, a 74 y.o. male, was seen for a [facility] cancer genetics consultation at the request of Dr. Tarri Glenn due to a personal history of colon polyps.  Ronald Berger presents to clinic today to discuss the possibility of a hereditary predisposition to cancer, genetic testing, and to further clarify his future cancer risks, as well as potential cancer risks for family members.    Ronald Berger is a 74 y.o. male with no personal history of cancer.    In 2021, he had a colonoscopy that revealed 10 tubular adenomas. He reports history of 4 additional polyps, total of 3 colonoscopies.   CANCER HISTORY:  Oncology History   No history exists.     Past Medical History:  Diagnosis Date  . Allergic rhinitis   . Allergy   . 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  . Bilateral carpal tunnel syndrome 12/08/2018  . 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 NSTEMI 07/2013 - DES to mLCx, DES to prox OM1, DES to mLAD with nonobstructive RCA stenosis, EF 55-65%.  . Chronic kidney disease    kidney stones   . Dyslipidemia   . Family history of bone cancer   . GERD (gastroesophageal reflux disease)   . History of kidney stones   . History of non-ST elevation myocardial infarction (NSTEMI)    07/ 2015  S/P  DES X3  . History of palpitations   . Hyperlipidemia   . Hypertension   . Insomnia 12/08/2018   x many years  . Myocardial infarction ([place]) 2015   NSTEMI- 3 stents   . OSA (obstructive sleep apnea)    cpap intolerant  . Right knee meniscal tear   . S/P drug eluting coronary stent placement    07/ 2015  x3  to mLCFX, OM1, mLAD  . Sinus bradycardia   .  Sleep apnea    no cpap     Past Surgical History:  Procedure Laterality Date  . CATARACT EXTRACTION, BILATERAL    . COLONOSCOPY     >10 yrs ago-polyps per pt- we have no report   . EXTRACORPOREAL SHOCK WAVE LITHOTRIPSY  x2 last one 2005  . KNEE ARTHROSCOPY Right 05/02/2015   Procedure: RIGHT KNEE ARTHROSCOPY WITH medial meniscal DEBRIDEMENT ;  Surgeon: Gaynelle Arabian, MD;  [facility];  Service: Orthopedics;  Laterality: Right;  . LEFT HEART CATHETERIZATION WITH CORONARY ANGIOGRAM N/A 08/05/2013   Procedure: LEFT HEART CATHETERIZATION WITH CORONARY ANGIOGRAM;  Surgeon: Blane Ohara, MD;  Location: [facility] CATH LAB;  Service: Cardiovascular;  Laterality: N/A;  Promus DES's to  mLCFx,  OM1,  mLAD (total 3)/   mRCA 30%,  dLM  20-30%,  preserved LVSF, ef 55-65%  . LUMBAR SPINE SURGERY  x3  last one 1980's  . SHOULDER ARTHROSCOPY Right 2013    Social History   Socioeconomic History  . Marital status: Married    Spouse name: Not on file  . Number of children: Not on file  . Years of education: Not on file  . Highest education level: Not on file  Occupational History  Comment: Retired - prior Oceanographer  Tobacco Use  . Smoking status: Former Smoker    Years: 1.00    Types: Cigarettes    Quit date: 04/27/1966    Years since quitting: 52.8  . Smokeless tobacco: Never Used  Substance and Sexual Activity  . Alcohol use: Yes    Comment: occasional  . Drug use: No  . Sexual activity: Not on file  Other Topics Concern  . Not on file  Social History Narrative  . Not on file   Social Determinants of Health   Financial Resource Strain:   . Difficulty of Paying Living Expenses: Not on file  Food Insecurity:   . Worried About Charity fundraiser in the Last Year: Not on file  . Ran Out of Food in the Last Year: Not on file  Transportation Needs:   . Lack of Transportation (Medical): Not on file  . Lack of Transportation (Non-Medical): Not on file  Physical  Activity:   . Days of Exercise per Week: Not on file  . Minutes of Exercise per Session: Not on file  Stress:   . Feeling of Stress : Not on file  Social Connections:   . Frequency of Communication with Friends and Family: Not on file  . Frequency of Social Gatherings with Friends and Family: Not on file  . Attends Religious Services: Not on file  . Active Member of Clubs or Organizations: Not on file  . Attends Archivist Meetings: Not on file  . Marital Status: Not on file     FAMILY HISTORY:  We obtained a detailed, 4-generation family history.  Significant diagnoses are listed below: Family History  Problem Relation Age of Onset  . Coronary artery disease Mother        CABG  . CAD Sister   . Bone cancer Sister   . Alzheimer's disease Father   . Aneurysm Brother        brain  . Heart failure Sister   . Bone cancer Sister   . Colon cancer Neg Hx   . Colon polyps Neg Hx   . Esophageal cancer Neg Hx   . Rectal cancer Neg Hx   . Stomach cancer Neg Hx    Ronald Berger has 1 son, 12, no history of cancer. Patient had 1 full sister, 2 maternal half sisters and 1 maternal half brother. His full sister and one of his half sisters both had bone cancer. No known history of colon polyps.  Ronald Berger mother died at 65 due to coronary artery disease. Patient has limited information about this side of the family. He is unsure how many maternal aunts/uncles/cousins he has, and does not know of history of cancer or polyps. Maternal grandparents passed in their 63s, no known cancers/polyps.  Ronald Berger father passed in his 75s due to Alzheimer's, no history of cancer or polyps. Patient had 12 paternal aunts/uncles, but he does not have info about their medical history. No information about medical history for paternal cousins. Paternal grandfather passed at 63. Paternal grandmother passed in her 49s.  Ronald Berger is unaware of previous family history of genetic testing for  hereditary cancer risks. Patient's maternal ancestors are of [place] descent, and paternal ancestors are of [place] descent. There is no reported Ashkenazi Jewish ancestry. There is no known consanguinity.  GENETIC COUNSELING ASSESSMENT: Ronald Berger is a 74 y.o. male with a personal history of polyps which is somewhat suggestive of a hereditary polyposis condition  and predisposition to cancer. We, therefore, discussed and recommended the following at today's visit.   DISCUSSION: We discussed that polyps in general are common, however, most people have fewer than 5 lifetime polyps.  When an individual has 10 or more polyps we become concerned about an underlying polyposis syndrome.  The most common hereditary polyposis syndromes are caused by problems in the APC and MUTYH genes, however, more recently, mutations in the [place] and MSH3 genes have been identified in some polyposis families.  We discussed that testing is beneficial for several reasons including knowing how to follow individuals for cancer screenings, and understand if other family members could be at risk for cancer and allow them to undergo genetic testing.   We reviewed the characteristics, features and inheritance patterns of hereditary cancer syndromes. We also discussed genetic testing, including the appropriate family members to test, the process of testing, insurance coverage and turn-around-time for results. We discussed the implications of a negative, positive and/or variant of uncertain significant result. We recommended Ronald Berger pursue genetic testing for the [facility] Common Hereditary Cancers gene panel.   The Common Hereditary Cancers Panel offered by [facility] includes sequencing and/or deletion duplication testing of the following 48 genes: APC, ATM, AXIN2, BARD1, BMPR1A, BRCA1, BRCA2, BRIP1, CDH1, CDKN2A (p14ARF), CDKN2A (p16INK4a), CKD4, CHEK2, CTNNA1, DICER1, EPCAM (Deletion/duplication testing only), GREM1 (promoter  region deletion/duplication testing only), KIT, MEN1, MLH1, MSH2, MSH3, MSH6, MUTYH, NBN, NF1, NHTL1, PALB2, PDGFRA, PMS2, POLD1, POLE, PTEN, RAD50, RAD51C, RAD51D, RNF43, SDHB, SDHC, SDHD, SMAD4, SMARCA4. STK11, TP53, TSC1, TSC2, and VHL.  The following genes were evaluated for sequence changes only: SDHA and HOXB13 c.251G>A variant only.  Based on Ronald Berger personal history of polyps, he meets medical criteria for genetic testing. Despite that he meets criteria, he may still have an out of pocket cost.   PLAN: After considering the risks, benefits, and limitations, Ronald Berger provided informed consent to pursue genetic testing and the blood sample was sent to [facility] for analysis of the Common Hereditary Cancers Panel. Results should be available within approximately 2-3 weeks' time, at which point they will be disclosed by telephone to Ronald Berger, as will any additional recommendations warranted by these results. Ronald Berger will receive a summary of his genetic counseling visit and a copy of his results once available. This information will also be available in Epic.   Ronald Berger questions were answered to his satisfaction today. Our contact information was provided should additional questions or concerns arise. Thank you for the referral and allowing [place] to share in the care of your patient.   Faith Rogue, MS, [facility] Genetic Counselor [email or web address] Phone: [phone]  The patient was seen for a total of 40 minutes in face-to-face genetic counseling.  Drs. Magrinat, Lindi Adie and/or Burr Medico were available for discussion regarding this case.   _______________________________________________________________________ For Office Staff:  Number of people involved in session: 1 Was an Intern/ student involved with case: no

## 2019-03-11 ENCOUNTER — Other Ambulatory Visit: Payer: Self-pay | Admitting: Family Medicine

## 2019-03-11 ENCOUNTER — Ambulatory Visit: Payer: Medicare HMO | Admitting: Family Medicine

## 2019-03-11 ENCOUNTER — Telehealth: Payer: Self-pay | Admitting: Licensed Clinical Social Worker

## 2019-03-11 ENCOUNTER — Ambulatory Visit: Payer: Self-pay | Admitting: Licensed Clinical Social Worker

## 2019-03-11 DIAGNOSIS — G4700 Insomnia, unspecified: Secondary | ICD-10-CM

## 2019-03-11 DIAGNOSIS — Z1379 Encounter for other screening for genetic and chromosomal anomalies: Secondary | ICD-10-CM | POA: Insufficient documentation

## 2019-03-11 DIAGNOSIS — D369 Benign neoplasm, unspecified site: Secondary | ICD-10-CM

## 2019-03-11 DIAGNOSIS — Z808 Family history of malignant neoplasm of other organs or systems: Secondary | ICD-10-CM

## 2019-03-11 NOTE — Progress Notes (Signed)
HPI:  Mr. Amis was previously seen in the [facility] clinic due to a personal history of colon polyps and concerns regarding a hereditary predisposition to cancer. Please refer to our prior cancer genetics clinic note for more information regarding our discussion, assessment and recommendations, at the time. Mr. Maddix recent genetic test results were disclosed to him, as were recommendations warranted by these results. These results and recommendations are discussed in more detail below.  CANCER HISTORY:  Oncology History   No history exists.    FAMILY HISTORY:  We obtained a detailed, 4-generation family history.  Significant diagnoses are listed below: Family History  Problem Relation Age of Onset  . Coronary artery disease Mother        CABG  . CAD Sister   . Bone cancer Sister   . Alzheimer's disease Father   . Aneurysm Brother        brain  . Heart failure Sister   . Bone cancer Sister   . Colon cancer Neg Hx   . Colon polyps Neg Hx   . Esophageal cancer Neg Hx   . Rectal cancer Neg Hx   . Stomach cancer Neg Hx    Mr. Abercrombie has 1 son, 56, no history of cancer. Patient had 1 full sister, 2 maternal half sisters and 1 maternal half brother. His full sister and one of his half sisters both had bone cancer. No known history of colon polyps.  Mr. Ganas mother died at 36 due to coronary artery disease. Patient has limited information about this side of the family. He is unsure how many maternal aunts/uncles/cousins he has, and does not know of history of cancer or polyps. Maternal grandparents passed in their 53s, no known cancers/polyps.  Mr. Nakama father passed in his 56s due to Alzheimer's, no history of cancer or polyps. Patient had 12 paternal aunts/uncles, but he does not have info about their medical history. No information about medical history for paternal cousins. Paternal grandfather passed at 23. Paternal grandmother passed in her  86s.  Mr. Crear is unaware of previous family history of genetic testing for hereditary cancer risks. Patient's maternal ancestors are of [place] descent, and paternal ancestors are of [place] descent. There is no reported Ashkenazi Jewish ancestry. There is no known consanguinity.    GENETIC TEST RESULTS: Genetic testing reported out on 03/10/2019 through the [facility] Common Hereditary cancer panel found no pathogenic mutations.  The Common Hereditary Cancers Panel offered by [facility] includes sequencing and/or deletion duplication testing of the following 48 genes: APC, ATM, AXIN2, BARD1, BMPR1A, BRCA1, BRCA2, BRIP1, CDH1, CDKN2A (p14ARF), CDKN2A (p16INK4a), CKD4, CHEK2, CTNNA1, DICER1, EPCAM (Deletion/duplication testing only), GREM1 (promoter region deletion/duplication testing only), KIT, MEN1, MLH1, MSH2, MSH3, MSH6, MUTYH, NBN, NF1, NHTL1, PALB2, PDGFRA, PMS2, POLD1, POLE, PTEN, RAD50, RAD51C, RAD51D, RNF43, SDHB, SDHC, SDHD, SMAD4, SMARCA4. STK11, TP53, TSC1, TSC2, and VHL.  The following genes were evaluated for sequence changes only: SDHA and HOXB13 c.251G>A variant only.  The test report has been scanned into EPIC and is located under the Molecular Pathology section of the Results Review tab.  A portion of the result report is included below for reference.     We discussed with Mr. Lightner that because current genetic testing is not perfect, it is possible there may be a gene mutation in one of these genes that current testing cannot detect, but that chance is small.  We also discussed, that there could be another gene that has  not yet been discovered, or that we have not yet tested, that is responsible for the colon polyps/cancer diagnoses in the family. It is also possible there is a hereditary cause for the cancer in the family that Mr. Klas did not inherit and therefore was not identified in his testing.  Therefore, it is important to remain in touch with cancer genetics in the  future so that we can continue to offer Mr. Kopka the most up to date genetic testing.   ADDITIONAL GENETIC TESTING: We discussed with Mr. Toledo that his genetic testing was fairly extensive.  If there are genes identified to increase cancer risk that can be analyzed in the future, we would be happy to discuss and coordinate this testing at that time.    CANCER SCREENING RECOMMENDATIONS: Mr. Bevis test result is considered negative (normal).  This means that we have not identified a hereditary cause for his personal history of colon polyps at this time.  This negative genetic test simply tells [place] that we cannot yet define why Mr. Hershman has had an increased number of colorectal polyps. Mr. [facility] medical management and screening should be based on the prospect that he will likely form more colon polyps and should, therefore, undergo more frequent colonoscopy screening at intervals determined by his GI providers.  We also recommended that Mr. Varnell have an upper endoscopy periodically.  While reassuring, this does not definitively rule out a hereditary predisposition to cancer. It is still possible that there could be genetic mutations that are undetectable by current technology. There could be genetic mutations in genes that have not been tested or identified to increase cancer risk.  Therefore, it is recommended he continue to follow the cancer management and screening guidelines provided by his oncology and primary healthcare provider.   An individual's cancer risk and medical management are not determined by genetic test results alone. Overall cancer risk assessment incorporates additional factors, including personal medical history, family history, and any available genetic information that may result in a personalized plan for cancer prevention and surveillance.  RECOMMENDATIONS FOR FAMILY MEMBERS:  Relatives in this family might be at some increased risk of developing cancer, over the  general population risk, simply due to the family history of cancer.  We recommended male relatives in this family have a yearly mammogram beginning at age 71, or 36 years younger than the earliest onset of cancer, an annual clinical breast exam, and perform monthly breast self-exams. Male relatives in this family should also have a gynecological exam as recommended by their primary provider. All family members should have a colonoscopy by age 30, or as directed by their physicians. We recommended his son be aware of the family history of polyps as he may need a colonoscopy before age 63.  FOLLOW-UP: Lastly, we discussed with Mr. Riendeau that cancer genetics is a rapidly advancing field and it is possible that new genetic tests will be appropriate for him and/or his family members in the future. We encouraged him to remain in contact with cancer genetics on an annual basis so we can update his personal and family histories and let him know of advances in cancer genetics that may benefit this family.   Our contact number was provided. Mr. Alton questions were answered to his satisfaction, and he knows he is welcome to call us at anytime with additional questions or concerns.   Faith Rogue, MS, [facility] Genetic Counselor [email or web address] Phone: [phone]]

## 2019-03-11 NOTE — Telephone Encounter (Signed)
error 

## 2019-03-11 NOTE — Telephone Encounter (Signed)
Revealed negative genetic testing. We discussed that we do not know why he has colon polyps. It could be due to a different gene that we are not testing, or something our current technology cannot pick up.  It will be important for him to keep in contact with genetics to learn if additional testing may be needed in the future.

## 2019-04-01 ENCOUNTER — Ambulatory Visit (INDEPENDENT_AMBULATORY_CARE_PROVIDER_SITE_OTHER): Payer: Medicare HMO | Admitting: *Deleted

## 2019-04-01 VITALS — Ht 69.0 in | Wt 210.1 lb

## 2019-04-01 DIAGNOSIS — Z0000 Encounter for general adult medical examination without abnormal findings: Secondary | ICD-10-CM

## 2019-04-01 NOTE — Progress Notes (Signed)
MEDICARE ANNUAL WELLNESS VISIT  04/01/2019  Telephone Visit Disclaimer This Medicare AWV was conducted by telephone due to national recommendations for restrictions regarding the COVID-19 Pandemic (e.g. social distancing).  I verified, using two identifiers, that I am speaking with Ronald Berger or their authorized healthcare agent. I discussed the limitations, risks, security, and privacy concerns of performing an evaluation and management service by telephone and the potential availability of an in-person appointment in the future. The patient expressed understanding and agreed to proceed.   Subjective:  [name] is a 74 y.o. male patient of Loman Brooklyn, FNP who had a Medicare Annual Wellness Visit today via telephone. Saiyan is Working full time and lives with their spouse. he has 1 child. he reports that he is socially active and does interact with friends/family regularly. he is minimally physically active and enjoys fishing.  Patient Care Team: Loman Brooklyn, FNP as PCP - General (Family Medicine) Pixie Casino, MD as PCP - Cardiology (Cardiology)  Advanced Directives 04/01/2019 04/01/2019 05/02/2015 02/14/2014 02/14/2014 11/17/2013 08/04/2013  Does Patient Have a Medical Advance Directive? Yes No No No No Yes;No Patient would not like information  Type of Scientist, forensic Power of [place];Living will - - - - - -  Does patient want to make changes to medical advance directive? No - Patient declined - - - - No - Patient declined -  Copy of [place] in Chart? No - copy requested - - - - No - copy requested -  Would patient like information on creating a medical advance directive? No - Patient declined Yes (MAU/Ambulatory/Procedural Areas - Information given) Yes - Educational materials given No - patient declined information - No - patient declined information -  Pre-existing out of facility DNR order (yellow form or pink MOST form) -  - - - - - No    Hospital Utilization Over the Past 12 Months: # of hospitalizations or ER visits: 0 # of surgeries: 0  Review of Systems    Patient reports that his overall health is unchanged compared to last year.  History obtained from chart review and the patient General ROS: negative  Patient Reported Readings (BP, Pulse, CBG, Weight, etc) none  Pain Assessment Pain : No/denies pain     Current Medications & Allergies (verified) Allergies as of 04/01/2019      Reactions   Oxycodone Nausea And Vomiting      Medication List       Accurate as of April 01, 2019 10:41 AM. If you have any questions, ask your nurse or doctor.        Advil 200 MG tablet Generic drug: ibuprofen Take 200 mg by mouth every 6 (six) hours as needed.   aspirin 81 MG chewable tablet Chew 1 tablet (81 mg total) by mouth daily.   atorvastatin 40 MG tablet Commonly known as: LIPITOR Take 1 tablet (40 mg total) by mouth every evening.   diclofenac Sodium 1 % Gel Commonly known as: Voltaren Apply 4 g topically 4 (four) times daily.   fluticasone 50 MCG/ACT nasal spray Commonly known as: FLONASE Place 2 sprays into both nostrils daily.   levocetirizine 5 MG tablet Commonly known as: XYZAL Take 1 tablet (5 mg total) by mouth every evening.   metoprolol tartrate 25 MG tablet Commonly known as: LOPRESSOR Take 0.5 tablets (12.5 mg total) by mouth 2 (two) times daily.   nitroGLYCERIN 0.4 MG SL tablet Commonly known  as: NITROSTAT Place 1 tablet (0.4 mg total) under the tongue every 5 (five) minutes as needed for chest pain.   omeprazole 20 MG capsule Commonly known as: PRILOSEC Take 1 capsule (20 mg total) by mouth every evening.   temazepam 15 MG capsule Commonly known as: RESTORIL TAKE 1 CAPSULE (15 MG TOTAL) BY MOUTH AT BEDTIME AS NEEDED FOR SLEEP.       History (reviewed): Past Medical History:  Diagnosis Date  . Allergic rhinitis   . Allergy   . 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  . Bilateral carpal tunnel syndrome 12/08/2018  . 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 NSTEMI 07/2013 - DES to mLCx, DES to prox OM1, DES to mLAD with nonobstructive RCA stenosis, EF 55-65%.  . Chronic kidney disease    kidney stones   . Dyslipidemia   . Family history of bone cancer   . GERD (gastroesophageal reflux disease)   . History of kidney stones   . History of non-ST elevation myocardial infarction (NSTEMI)    07/ 2015  S/P  DES X3  . History of palpitations   . Hyperlipidemia   . Hypertension   . Insomnia 12/08/2018   x many years  . Myocardial infarction ([place]) 2015   NSTEMI- 3 stents   . OSA (obstructive sleep apnea)    cpap intolerant  . Right knee meniscal tear   . S/P drug eluting coronary stent placement    07/ 2015  x3  to mLCFX, OM1, mLAD  . Sinus bradycardia   . Sleep apnea    no cpap    Past Surgical History:  Procedure Laterality Date  . CATARACT EXTRACTION, BILATERAL    . COLONOSCOPY     >10 yrs ago-polyps per pt- we have no report   . EXTRACORPOREAL SHOCK WAVE LITHOTRIPSY  x2 last one 2005  . KNEE ARTHROSCOPY Right 05/02/2015   Procedure: RIGHT KNEE ARTHROSCOPY WITH medial meniscal DEBRIDEMENT ;  Surgeon: Gaynelle Arabian, MD;  Location: [facility];  Service: Orthopedics;  Laterality: Right;  . LEFT HEART CATHETERIZATION WITH CORONARY ANGIOGRAM N/A 08/05/2013   Procedure: LEFT HEART CATHETERIZATION WITH CORONARY ANGIOGRAM;  Surgeon: Blane Ohara, MD;  Location: [facility] CATH LAB;  Service: Cardiovascular;  Laterality: N/A;  Promus DES's to  mLCFx,  OM1,  mLAD (total 3)/   mRCA 30%,  dLM  20-30%,  preserved LVSF, ef 55-65%  . LUMBAR SPINE SURGERY  x3  last one 1980's  . SHOULDER ARTHROSCOPY Right 2013   Family History  Problem Relation Age of Onset  . Coronary artery disease Mother        CABG  . CAD Sister   . Bone cancer  Sister   . Alzheimer's disease Father   . Aneurysm Brother        brain  . Heart failure Sister   . Bone cancer Sister   . Colon cancer Neg Hx   . Colon polyps Neg Hx   . Esophageal cancer Neg Hx   . Rectal cancer Neg Hx   . Stomach cancer Neg Hx    Social History   Socioeconomic History  . Marital status: Married    Spouse name: Joelene Millin  . Number of children: 1  . Years of education: 39  . Highest education level: Some college, no degree  Occupational History    Comment: Retired -  prior surveyor  Tobacco Use  . Smoking status: Former Smoker    Years: 1.00    Types: Cigarettes    Quit date: 04/27/1966    Years since quitting: 52.9  . Smokeless tobacco: Never Used  Substance and Sexual Activity  . Alcohol use: Yes    Comment: occasional  . Drug use: No  . Sexual activity: Yes  Other Topics Concern  . Not on file  Social History Narrative  . Not on file   Social Determinants of Health   Financial Resource Strain:   . Difficulty of Paying Living Expenses:   Food Insecurity:   . Worried About Charity fundraiser in the Last Year:   . Arboriculturist in the Last Year:   Transportation Needs:   . Film/video editor (Medical):   Marland Kitchen Lack of Transportation (Non-Medical):   Physical Activity:   . Days of Exercise per Week:   . Minutes of Exercise per Session:   Stress:   . Feeling of Stress :   Social Connections:   . Frequency of Communication with Friends and Family:   . Frequency of Social Gatherings with Friends and Family:   . Attends Religious Services:   . Active Member of Clubs or Organizations:   . Attends Archivist Meetings:   Marland Kitchen Marital Status:     Activities of Daily Living In your present state of health, do you have any difficulty performing the following activities: 04/01/2019  Hearing? N  Vision? N  Difficulty concentrating or making decisions? N  Walking or climbing stairs? N  Dressing or bathing? N  Doing errands, shopping? N    Preparing Food and eating ? N  Using the Toilet? N  In the past six months, have you accidently leaked urine? N  Do you have problems with loss of bowel control? N  Managing your Medications? N  Managing your Finances? N  Housekeeping or managing your Housekeeping? N  Some recent data might be hidden    Patient Education/ Literacy How often do you need to have someone help you when you read instructions, pamphlets, or other written materials from your doctor or pharmacy?: 1 - Never What is the last grade level you completed in school?: Some College  Exercise Current Exercise Habits: The patient does not participate in regular exercise at present, Exercise limited by: None identified  Diet Patient reports consuming 3 meals a day and 1 snack(s) a day Patient reports that his primary diet is: Regular Patient reports that she does have regular access to food.   Depression Screen PHQ 2/9 Scores 04/01/2019 12/03/2018 11/17/2013  PHQ - 2 Score 0 0 0  PHQ- 9 Score - 0 -     Fall Risk Fall Risk  04/01/2019 12/03/2018 11/17/2013  Falls in the past year? 0 0 No  Number falls in past yr: 0 - -  Injury with Fall? 0 - -  Risk for fall due to : No Fall Risks - -  Follow up Falls evaluation completed - -     Objective:  Ronald Berger seemed alert and oriented and he participated appropriately during our telephone visit.  Blood Pressure Weight BMI  BP Readings from Last 3 Encounters:  02/10/19 127/78  01/31/19 (!) 148/84  12/03/18 123/69   Wt Readings from Last 3 Encounters:  04/01/19 210 lb 1.6 oz (95.3 kg)  02/10/19 210 lb (95.3 kg)  01/31/19 217 lb (98.4 kg)   BMI Readings from  Last 1 Encounters:  04/01/19 31.03 kg/m    *Unable to obtain current vital signs, weight, and BMI due to telephone visit type  Hearing/Vision  . Braelynn did not seem to have difficulty with hearing/understanding during the telephone conversation . Reports that he has had a formal eye exam by an  eye care professional within the past year . Reports that he has not had a formal hearing evaluation within the past year *Unable to fully assess hearing and vision during telephone visit type  Cognitive Function: 6CIT Screen 04/01/2019  What Year? 0 points  What month? 0 points  What time? 0 points  Count back from 20 0 points  Months in reverse 0 points  Repeat phrase 0 points  Total Score 0   (Normal:0-7, Significant for Dysfunction: >8)  Normal Cognitive Function Screening: Yes   Immunization & Health Maintenance Record Immunization History  Administered Date(s) Administered  . Fluad Quad(high Dose 65+) 12/03/2018  . Influenza,inj,Quad PF,6+ Mos 11/10/2016, 12/01/2017  . Influenza-Unspecified 11/27/2015  . Pneumococcal Conjugate-13 02/23/2015  . Pneumococcal Polysaccharide-23 08/06/2013    Health Maintenance  Topic Date Due  . TETANUS/TDAP  Never done  . COLONOSCOPY  02/09/2029  . INFLUENZA VACCINE  Completed  . Hepatitis C Screening  Completed  . PNA vac Low Risk Adult  Completed       Assessment  This is a routine wellness examination for Ronald Berger.  Health Maintenance: Due or Overdue Health Maintenance Due  Topic Date Due  . TETANUS/TDAP  Never done    Ronald Berger does not need a referral for Community Assistance: Care Management:   no Social Work:    no Prescription Assistance:  no Nutrition/Diabetes Education:  no   Plan:  Personalized Goals Goals Addressed            This Visit's Progress   . Exercise 3x per week (30 min per time)       Try to exercise for at least 30 minutes, 3 times weekly      Personalized Health Maintenance & Screening Recommendations  Td vaccine  Lung Cancer Screening Recommended: no (Low Dose CT Chest recommended if Age 53-80 years, 30 pack-year currently smoking OR have quit w/in past 15 years) Hepatitis C Screening recommended: no HIV Screening recommended: no  Advanced Directives: Written  information was not prepared per patient's request.  Referrals & Orders No orders of the defined types were placed in this encounter.   Follow-up Plan . Follow-up with Loman Brooklyn, FNP as planned    I have personally reviewed and noted the following in the patient's chart:   . Medical and social history . Use of alcohol, tobacco or illicit drugs  . Current medications and supplements . Functional ability and status . Nutritional status . Physical activity . Advanced directives . List of other physicians . Hospitalizations, surgeries, and ER visits in previous 12 months . Vitals . Screenings to include cognitive, depression, and falls . Referrals and appointments  In addition, I have reviewed and discussed with Ronald Berger certain preventive protocols, quality metrics, and best practice recommendations. A written personalized care plan for preventive services as well as general preventive health recommendations is available and can be mailed to the patient at his request.      Wardell Heath, LPN  [id number]

## 2019-04-01 NOTE — Patient Instructions (Signed)
  Ronald Berger , Thank you for taking time to come for your Medicare Wellness Visit. I appreciate your ongoing commitment to your health goals. Please review the following plan we discussed and let me know if I can assist you in the future.   These are the goals we discussed: Goals    . Exercise 3x per week (30 min per time)     Try to exercise for at least 30 minutes, 3 times weekly       This is a list of the screening recommended for you and due dates:  Health Maintenance  Topic Date Due  . Tetanus Vaccine  Never done  . Colon Cancer Screening  02/09/2029  . Flu Shot  Completed  .  Hepatitis C: One time screening is recommended by Center for Disease Control  (CDC) for  adults born from [birth year] through 1963.   Completed  . Pneumonia vaccines  Completed

## 2019-04-04 ENCOUNTER — Telehealth: Payer: Self-pay | Admitting: Family Medicine

## 2019-04-04 DIAGNOSIS — G8929 Other chronic pain: Secondary | ICD-10-CM

## 2019-04-04 MED ORDER — DICLOFENAC SODIUM 1 % EX GEL: 4.0000 g | Freq: Four times a day (QID) | CUTANEOUS | 3 refills | Status: DC

## 2019-04-04 NOTE — Telephone Encounter (Signed)
Sent to [facility] and pt aware

## 2019-04-04 NOTE — Telephone Encounter (Signed)
  Medication Request  04/04/2019  What is the name of the medication? diclofenac Sodium (VOLTAREN) 1 % GEL    Have you contacted your pharmacy to request a refill? yes  Which pharmacy would you like this sent to? [facility] mail order [facility]    Patient notified that their request is being sent to the clinical staff for review and that they should receive a call once it is complete. If they do not receive a call within 24 hours they can check with their pharmacy or our office.

## 2019-05-30 NOTE — Progress Notes (Signed)
Assessment & Plan:  1. Insomnia, unspecified type - Temazepam D/C'd. Rx'd Ambien CR 6.25 mg.   2-3. Hyperlipidemia LDL goal <70/CAD in native artery - Well controlled on current regimen. Continue statin therapy.  - CMP14+EGFR; Future - Lipid panel; Future  4. Seasonal allergic rhinitis, unspecified trigger - Well controlled on current regimen.   5. Gastroesophageal reflux disease, unspecified whether esophagitis present - Well controlled on current regimen.   6. Palpitations - Well controlled on current regimen.   7. Immunization due - Tdap (BOOSTRIX) 5-2.5-18.5 LF-MCG/0.5 injection; Inject 0.5 mLs into the muscle once for 1 dose.  Dispense: 0.5 mL; Refill: 0   Return in about 4 weeks (around 06/30/2019) for sleep (may do telephone if needed).  Hendricks Limes, MSN, APRN, FNP-C [facility]  Subjective:    Patient ID: Ronald Berger, male    DOB: 1945-03-03, 74 y.o.   MRN: [id number]  Patient Care Team: Loman Brooklyn, FNP as PCP - General (Family Medicine) Debara Pickett Nadean Corwin, MD as PCP - Cardiology (Cardiology)   Chief Complaint:  Chief Complaint  Patient presents with  . Hyperlipidemia    6 month follow up    HPI: Ronald Berger is a 74 y.o. male presenting on 06/02/2019 for Hyperlipidemia (6 month follow up)  Patient reports he is still not sleeping well with temazepam. He was sleeping well with Ambien 10 mg, but his previous PCP decreased his dosage to 5 mg due to his age. He did not sleep well with the lower dosage which was when I switched him to temazepam. He is interested in trying something different.   New complaints: None  Social history:  Relevant past medical, surgical, family and social history reviewed and updated as indicated. Interim medical history since our last visit reviewed.  Allergies and medications reviewed and updated.  DATA REVIEWED: CHART IN EPIC  ROS: Negative unless specifically indicated above in HPI.     Current Outpatient Medications:  .  aspirin 81 MG chewable tablet, Chew 1 tablet (81 mg total) by mouth daily., Disp: , Rfl:  .  atorvastatin (LIPITOR) 40 MG tablet, Take 1 tablet (40 mg total) by mouth every evening., Disp: 90 tablet, Rfl: 3 .  diclofenac Sodium (VOLTAREN) 1 % GEL, Apply 4 g topically 4 (four) times daily., Disp: 350 g, Rfl: 3 .  fluticasone (FLONASE) 50 MCG/ACT nasal spray, Place 2 sprays into both nostrils daily., Disp: 16 g, Rfl: 5 .  ibuprofen (ADVIL) 200 MG tablet, Take 200 mg by mouth every 6 (six) hours as needed., Disp: , Rfl:  .  levocetirizine (XYZAL) 5 MG tablet, Take 1 tablet (5 mg total) by mouth every evening., Disp: 90 tablet, Rfl: 3 .  metoprolol tartrate (LOPRESSOR) 25 MG tablet, Take 0.5 tablets (12.5 mg total) by mouth 2 (two) times daily., Disp: 90 tablet, Rfl: 3 .  nitroGLYCERIN (NITROSTAT) 0.4 MG SL tablet, Place 1 tablet (0.4 mg total) under the tongue every 5 (five) minutes as needed for chest pain., Disp: 25 tablet, Rfl: 12 .  omeprazole (PRILOSEC) 20 MG capsule, Take 1 capsule (20 mg total) by mouth every evening., Disp: 90 capsule, Rfl: 3 .  traZODone (DESYREL) 50 MG tablet, Take 1-2 tablets (50-100 mg total) by mouth at bedtime as needed for sleep., Disp: 30 tablet, Rfl: 2   Allergies  Allergen Reactions  . Oxycodone Nausea And Vomiting   Past Medical History:  Diagnosis Date  . Allergic rhinitis   . Allergy   .  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  . Bilateral carpal tunnel syndrome 12/08/2018  . 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 NSTEMI 07/2013 - DES to mLCx, DES to prox OM1, DES to mLAD with nonobstructive RCA stenosis, EF 55-65%.  . Chronic kidney disease    kidney stones   . Dyslipidemia   . Family history of bone cancer   . GERD (gastroesophageal reflux disease)   . History of kidney stones   . History of non-ST elevation myocardial  infarction (NSTEMI)    07/ 2015  S/P  DES X3  . History of palpitations   . Hyperlipidemia   . Hypertension   . Insomnia 12/08/2018   x many years  . Myocardial infarction ([place]) 2015   NSTEMI- 3 stents   . OSA (obstructive sleep apnea)    cpap intolerant  . Right knee meniscal tear   . S/P drug eluting coronary stent placement    07/ 2015  x3  to mLCFX, OM1, mLAD  . Sinus bradycardia   . Sleep apnea    no cpap     Past Surgical History:  Procedure Laterality Date  . CATARACT EXTRACTION, BILATERAL    . COLONOSCOPY     >10 yrs ago-polyps per pt- we have no report   . EXTRACORPOREAL SHOCK WAVE LITHOTRIPSY  x2 last one 2005  . KNEE ARTHROSCOPY Right 05/02/2015   Procedure: RIGHT KNEE ARTHROSCOPY WITH medial meniscal DEBRIDEMENT ;  Surgeon: Gaynelle Arabian, MD;  Location[facility];  Service: Orthopedics;  Laterality: Right;  . LEFT HEART CATHETERIZATION WITH CORONARY ANGIOGRAM N/A 08/05/2013   Procedure: LEFT HEART CATHETERIZATION WITH CORONARY ANGIOGRAM;  Surgeon: Blane Ohara, MD;  Location: [facility] CATH LAB;  Service: Cardiovascular;  Laterality: N/A;  Promus DES's to  mLCFx,  OM1,  mLAD (total 3)/   mRCA 30%,  dLM  20-30%,  preserved LVSF, ef 55-65%  . LUMBAR SPINE SURGERY  x3  last one 1980's  . SHOULDER ARTHROSCOPY Right 2013    Social History   Socioeconomic History  . Marital status: Married    Spouse name: Joelene Millin  . Number of children: 1  . Years of education: 78  . Highest education level: Some college, no degree  Occupational History    Comment: Retired - prior Oceanographer  Tobacco Use  . Smoking status: Former Smoker    Years: 1.00    Types: Cigarettes    Quit date: 04/27/1966    Years since quitting: 53.1  . Smokeless tobacco: Never Used  Substance and Sexual Activity  . Alcohol use: Yes    Comment: occasional  . Drug use: No  . Sexual activity: Yes  Other Topics Concern  . Not on file  Social History Narrative  . Not on file   Social  Determinants of Health   Financial Resource Strain:   . Difficulty of Paying Living Expenses:   Food Insecurity:   . Worried About Charity fundraiser in the Last Year:   . Arboriculturist in the Last Year:   Transportation Needs:   . Film/video editor (Medical):   Marland Kitchen Lack of Transportation (Non-Medical):   Physical Activity:   . Days of Exercise per Week:   . Minutes of Exercise per Session:   Stress:   . Feeling of Stress :   Social Connections:   . Frequency  of Communication with Friends and Family:   . Frequency of Social Gatherings with Friends and Family:   . Attends Religious Services:   . Active Member of Clubs or Organizations:   . Attends Archivist Meetings:   Marland Kitchen Marital Status:   Intimate Partner Violence:   . Fear of Current or Ex-Partner:   . Emotionally Abused:   Marland Kitchen Physically Abused:   . Sexually Abused:         Objective:    BP 137/78   Pulse (!) 56   Temp (!) 97.1 F (36.2 C) (Temporal)   Ht _0  (1.753 m)   Wt 218 lb 12.8 oz (99.2 kg)   SpO2 95%   BMI 32.31 kg/m   Wt Readings from Last 3 Encounters:  06/02/19 218 lb 12.8 oz (99.2 kg)  04/01/19 210 lb 1.6 oz (95.3 kg)  02/10/19 210 lb (95.3 kg)    Physical Exam Vitals reviewed.  Constitutional:      General: He is not in acute distress.    Appearance: Normal appearance. He is obese. He is not ill-appearing, toxic-appearing or diaphoretic.  HENT:     Head: Normocephalic and atraumatic.  Eyes:     General: No scleral icterus.       Right eye: No discharge.        Left eye: No discharge.     Conjunctiva/sclera: Conjunctivae normal.  Cardiovascular:     Rate and Rhythm: Normal rate and regular rhythm.     Heart sounds: Normal heart sounds. No murmur. No friction rub. No gallop.   Pulmonary:     Effort: Pulmonary effort is normal. No respiratory distress.     Breath sounds: Normal breath sounds. No stridor. No wheezing, rhonchi or rales.  Musculoskeletal:        General:  Normal range of motion.     Cervical back: Normal range of motion.  Skin:    General: Skin is warm and dry.  Neurological:     Mental Status: He is alert and oriented to person, place, and time. Mental status is at baseline.  Psychiatric:        Mood and Affect: Mood normal.        Behavior: Behavior normal.        Thought Content: Thought content normal.        Judgment: Judgment normal.     Lab Results  Component Value Date   TSH 4.248 02/14/2014   Lab Results  Component Value Date   WBC 7.3 12/03/2018   HGB 15.2 12/03/2018   HCT 45.1 12/03/2018   MCV 87 12/03/2018   PLT 217 12/03/2018   Lab Results  Component Value Date   NA 140 06/03/2019   K 4.4 06/03/2019   CO2 23 06/03/2019   GLUCOSE 105 (H) 06/03/2019   BUN 19 06/03/2019   CREATININE 0.93 06/03/2019   BILITOT 0.5 06/03/2019   ALKPHOS 104 06/03/2019   AST 24 06/03/2019   ALT 30 06/03/2019   PROT 6.9 06/03/2019   ALBUMIN 4.2 06/03/2019   CALCIUM 9.0 06/03/2019   ANIONGAP 8 02/15/2014   Lab Results  Component Value Date   CHOL 135 06/03/2019   Lab Results  Component Value Date   HDL 45 06/03/2019   Lab Results  Component Value Date   LDLCALC 66 06/03/2019   Lab Results  Component Value Date   TRIG 137 06/03/2019   Lab Results  Component Value Date   CHOLHDL 3.0 06/03/2019  Lab Results  Component Value Date   HGBA1C 5.7 (H) 08/04/2013

## 2019-06-02 ENCOUNTER — Other Ambulatory Visit: Payer: Self-pay

## 2019-06-02 ENCOUNTER — Ambulatory Visit (INDEPENDENT_AMBULATORY_CARE_PROVIDER_SITE_OTHER): Payer: Medicare HMO | Admitting: Family Medicine

## 2019-06-02 ENCOUNTER — Encounter: Payer: Self-pay | Admitting: Family Medicine

## 2019-06-02 ENCOUNTER — Telehealth: Payer: Self-pay | Admitting: *Deleted

## 2019-06-02 VITALS — BP 137/78 | HR 56 | Temp 97.1°F | Ht 69.0 in | Wt 218.8 lb

## 2019-06-02 DIAGNOSIS — E785 Hyperlipidemia, unspecified: Secondary | ICD-10-CM

## 2019-06-02 DIAGNOSIS — G4700 Insomnia, unspecified: Secondary | ICD-10-CM

## 2019-06-02 DIAGNOSIS — K219 Gastro-esophageal reflux disease without esophagitis: Secondary | ICD-10-CM

## 2019-06-02 DIAGNOSIS — I2510 Atherosclerotic heart disease of native coronary artery without angina pectoris: Secondary | ICD-10-CM

## 2019-06-02 DIAGNOSIS — R002 Palpitations: Secondary | ICD-10-CM

## 2019-06-02 DIAGNOSIS — J302 Other seasonal allergic rhinitis: Secondary | ICD-10-CM

## 2019-06-02 DIAGNOSIS — Z23 Encounter for immunization: Secondary | ICD-10-CM

## 2019-06-02 MED ORDER — TETANUS-DIPHTH-ACELL PERTUSSIS 5-2.5-18.5 LF-MCG/0.5 IM SUSP: 0.5000 mL | Freq: Once | INTRAMUSCULAR | 0 refills | Status: AC

## 2019-06-02 MED ORDER — TRAZODONE HCL 50 MG PO TABS: 50.0000 mg | ORAL_TABLET | Freq: Every evening | ORAL | 2 refills | Status: DC | PRN

## 2019-06-02 MED ORDER — ZOLPIDEM TARTRATE ER 6.25 MG PO TBCR: 6.2500 mg | EXTENDED_RELEASE_TABLET | Freq: Every evening | ORAL | 2 refills | Status: DC | PRN

## 2019-06-02 NOTE — Telephone Encounter (Signed)
Patient aware.

## 2019-06-02 NOTE — Telephone Encounter (Signed)
Please discuss with patient and see what he would like to do.

## 2019-06-02 NOTE — Telephone Encounter (Signed)
Zolpidem Tartrate ER 6.25MG  er tablets-Non Formulary  Must try and fail TRAZODONE,BELSOMRA  Or pharmacy can try using discount code:FOR DISCOUNT,USE [id number] For RxLocal Coupon Price of: $27.33 submit to [facility]: ID:[id number] PCN: CP Group: [facility] --

## 2019-06-02 NOTE — Telephone Encounter (Signed)
lmtbc °

## 2019-06-02 NOTE — Telephone Encounter (Signed)
Patient aware and verbalized understanding. Would like to try one of the other 2 first

## 2019-06-02 NOTE — Patient Instructions (Addendum)
[facility] [address] [place]

## 2019-06-02 NOTE — Telephone Encounter (Signed)
Please advise patient I sent Trazodone. He should start with 50 mg QHS. He may increase to 100 mg after 4-5 days if he needs to increase the dosage.

## 2019-06-03 ENCOUNTER — Other Ambulatory Visit: Payer: Medicare HMO

## 2019-06-03 DIAGNOSIS — E785 Hyperlipidemia, unspecified: Secondary | ICD-10-CM

## 2019-06-04 ENCOUNTER — Encounter: Payer: Self-pay | Admitting: Family Medicine

## 2019-06-04 LAB — LIPID PANEL
Chol/HDL Ratio: 3 ratio (ref 0.0–5.0)
Cholesterol, Total: 135 mg/dL (ref 100–199)
HDL: 45 mg/dL (ref >39)
LDL Chol Calc (NIH): 66 mg/dL (ref 0–99)
Triglycerides: 137 mg/dL (ref 0–149)
VLDL Cholesterol Cal: 24 mg/dL (ref 5–40)

## 2019-06-04 LAB — CMP14+EGFR
ALT: 30 IU/L (ref 0–44)
AST: 24 IU/L (ref 0–40)
Albumin/Globulin Ratio: 1.6 (ref 1.2–2.2)
Albumin: 4.2 g/dL (ref 3.7–4.7)
Alkaline Phosphatase: 104 IU/L (ref 39–117)
BUN/Creatinine Ratio: 20 (ref 10–24)
BUN: 19 mg/dL (ref 8–27)
Bilirubin Total: 0.5 mg/dL (ref 0.0–1.2)
CO2: 23 mmol/L (ref 20–29)
Calcium: 9 mg/dL (ref 8.6–10.2)
Chloride: 104 mmol/L (ref 96–106)
Creatinine, Ser: 0.93 mg/dL (ref 0.76–1.27)
GFR calc Af Amer: 95 mL/min/{1.73_m2} (ref >59)
GFR calc non Af Amer: 82 mL/min/{1.73_m2} (ref >59)
Globulin, Total: 2.7 g/dL (ref 1.5–4.5)
Glucose: 105 mg/dL — ABNORMAL HIGH (ref 65–99)
Potassium: 4.4 mmol/L (ref 3.5–5.2)
Sodium: 140 mmol/L (ref 134–144)
Total Protein: 6.9 g/dL (ref 6.0–8.5)

## 2019-06-10 ENCOUNTER — Other Ambulatory Visit: Payer: Self-pay | Admitting: Family Medicine

## 2019-06-10 DIAGNOSIS — J302 Other seasonal allergic rhinitis: Secondary | ICD-10-CM

## 2019-06-10 MED ORDER — FLUTICASONE PROPIONATE 50 MCG/ACT NA SUSP: 2.0000 | Freq: Every day | NASAL | 1 refills | Status: DC

## 2019-06-10 NOTE — Telephone Encounter (Signed)
Pt aware refill sent to [facility] 

## 2019-06-10 NOTE — Telephone Encounter (Signed)
  Prescription Request  06/10/2019  What is the name of the medication or equipment? GENERIC FOR FLONASE   Have you contacted your pharmacy to request a refill? (if applicable) NO  Which pharmacy would you like this sent to? [facility]   Patient notified that their request is being sent to the clinical staff for review and that they should receive a response within 2 business days.

## 2019-07-01 ENCOUNTER — Other Ambulatory Visit: Payer: Self-pay | Admitting: Family Medicine

## 2019-07-01 DIAGNOSIS — G8929 Other chronic pain: Secondary | ICD-10-CM

## 2019-07-05 ENCOUNTER — Encounter: Payer: Self-pay | Admitting: Family Medicine

## 2019-07-05 ENCOUNTER — Telehealth (INDEPENDENT_AMBULATORY_CARE_PROVIDER_SITE_OTHER): Payer: Medicare HMO | Admitting: Family Medicine

## 2019-07-05 DIAGNOSIS — F5101 Primary insomnia: Secondary | ICD-10-CM | POA: Diagnosis not present

## 2019-07-05 MED ORDER — TRAZODONE HCL 50 MG PO TABS: 50.0000 mg | ORAL_TABLET | Freq: Every evening | ORAL | 2 refills | Status: DC | PRN

## 2019-07-05 NOTE — Progress Notes (Signed)
Virtual Visit via Telephone Note  I connected with Ronald Berger on 07/05/19 at 8:37 AM by telephone and verified that I am speaking with the correct person using two identifiers. Ronald Berger is currently located at work and nobody is currently with him during this visit. The provider, Loman Brooklyn, FNP is located in their home at time of visit.  I discussed the limitations, risks, security and privacy concerns of performing an evaluation and management service by telephone and the availability of in person appointments. I also discussed with the patient that there may be a patient responsible charge related to this service. The patient expressed understanding and agreed to proceed.  Subjective: PCP: Loman Brooklyn, FNP  Chief Complaint  Patient presents with  . Insomnia   Patient has been on trazodone 50 mg at bedtime for the past month and reports he is sleeping much better.  He did not need to increase to the 100 mg that I have told him he could do if needed.   ROS: Per HPI  Current Outpatient Medications:  .  aspirin 81 MG chewable tablet, Chew 1 tablet (81 mg total) by mouth daily., Disp: , Rfl:  .  atorvastatin (LIPITOR) 40 MG tablet, Take 1 tablet (40 mg total) by mouth every evening., Disp: 90 tablet, Rfl: 3 .  diclofenac Sodium (VOLTAREN) 1 % GEL, Apply 4 g topically 4 (four) times daily., Disp: 350 g, Rfl: 3 .  fluticasone (FLONASE) 50 MCG/ACT nasal spray, Place 2 sprays into both nostrils daily., Disp: 48 g, Rfl: 1 .  ibuprofen (ADVIL) 200 MG tablet, Take 200 mg by mouth every 6 (six) hours as needed., Disp: , Rfl:  .  levocetirizine (XYZAL) 5 MG tablet, Take 1 tablet (5 mg total) by mouth every evening., Disp: 90 tablet, Rfl: 3 .  metoprolol tartrate (LOPRESSOR) 25 MG tablet, Take 0.5 tablets (12.5 mg total) by mouth 2 (two) times daily., Disp: 90 tablet, Rfl: 3 .  nitroGLYCERIN (NITROSTAT) 0.4 MG SL tablet, Place 1 tablet (0.4 mg total) under the tongue every  5 (five) minutes as needed for chest pain., Disp: 25 tablet, Rfl: 12 .  omeprazole (PRILOSEC) 20 MG capsule, Take 1 capsule (20 mg total) by mouth every evening., Disp: 90 capsule, Rfl: 3 .  traZODone (DESYREL) 50 MG tablet, Take 1-2 tablets (50-100 mg total) by mouth at bedtime as needed for sleep., Disp: 30 tablet, Rfl: 2  Allergies  Allergen Reactions  . Oxycodone Nausea And Vomiting   Past Medical History:  Diagnosis Date  . Allergic rhinitis   . Allergy   . 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  . Bilateral carpal tunnel syndrome 12/08/2018  . 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 NSTEMI 07/2013 - DES to mLCx, DES to prox OM1, DES to mLAD with nonobstructive RCA stenosis, EF 55-65%.  . Chronic kidney disease    kidney stones   . Dyslipidemia   . Family history of bone cancer   . GERD (gastroesophageal reflux disease)   . History of kidney stones   . History of non-ST elevation myocardial infarction (NSTEMI)    07/ 2015  S/P  DES X3  . History of palpitations   . Hyperlipidemia   . Hypertension   . Insomnia 12/08/2018   x many years  . Myocardial infarction ([place]) 2015   NSTEMI- 3 stents   .  OSA (obstructive sleep apnea)    cpap intolerant  . Right knee meniscal tear   . S/P drug eluting coronary stent placement    07/ 2015  x3  to mLCFX, OM1, mLAD  . Sinus bradycardia   . Sleep apnea    no cpap     Observations/Objective: A&O  No respiratory distress or wheezing audible over the phone Mood, judgement, and thought processes all WNL  Assessment and Plan: 1. Primary insomnia - Well controlled on current regimen.  - traZODone (DESYREL) 50 MG tablet; Take 1 tablet (50 mg total) by mouth at bedtime as needed for sleep.  Dispense: 90 tablet; Refill: 2   Follow Up Instructions: Return in about 6 months (around 01/04/2020) for follow-up of chronic medication conditions.  I  discussed the assessment and treatment plan with the patient. The patient was provided an opportunity to ask questions and all were answered. The patient agreed with the plan and demonstrated an understanding of the instructions.   The patient was advised to call back or seek an in-person evaluation if the symptoms worsen or if the condition fails to improve as anticipated.  The above assessment and management plan was discussed with the patient. The patient verbalized understanding of and has agreed to the management plan. Patient is aware to call the clinic if symptoms persist or worsen. Patient is aware when to return to the clinic for a follow-up visit. Patient educated on when it is appropriate to go to the emergency department.   Time call ended: 8:47 AM  I provided 12 minutes of non-face-to-face time during this encounter.  Hendricks Limes, MSN, APRN, FNP-C [facility] 07/05/19

## 2019-07-19 ENCOUNTER — Other Ambulatory Visit: Payer: Self-pay | Admitting: Family Medicine

## 2019-07-19 DIAGNOSIS — R002 Palpitations: Secondary | ICD-10-CM

## 2019-07-19 DIAGNOSIS — E785 Hyperlipidemia, unspecified: Secondary | ICD-10-CM

## 2019-07-19 DIAGNOSIS — F5101 Primary insomnia: Secondary | ICD-10-CM

## 2019-07-19 DIAGNOSIS — J302 Other seasonal allergic rhinitis: Secondary | ICD-10-CM

## 2019-07-19 DIAGNOSIS — K219 Gastro-esophageal reflux disease without esophagitis: Secondary | ICD-10-CM

## 2019-07-19 NOTE — Telephone Encounter (Signed)
  Prescription Request  07/19/2019  What is the name of the medication or equipment? Pt said [facility] pharmacy said they need new prescriptions called in on all of his meds. He is new to [place]  Have you contacted your pharmacy to request a refill? (if applicable) they contacted pt  Which pharmacy would you like this sent to? [facility]   Patient notified that their request is being sent to the clinical staff for review and that they should receive a response within 2 business days.

## 2019-07-20 MED ORDER — TRAZODONE HCL 50 MG PO TABS: 50.0000 mg | ORAL_TABLET | Freq: Every evening | ORAL | 2 refills | Status: DC | PRN

## 2019-07-20 MED ORDER — METOPROLOL TARTRATE 25 MG PO TABS: 12.5000 mg | ORAL_TABLET | Freq: Two times a day (BID) | ORAL | 3 refills | Status: DC

## 2019-07-20 MED ORDER — OMEPRAZOLE 20 MG PO CPDR: 20.0000 mg | DELAYED_RELEASE_CAPSULE | Freq: Every evening | ORAL | 3 refills | Status: DC

## 2019-07-20 MED ORDER — ATORVASTATIN CALCIUM 40 MG PO TABS: 40.0000 mg | ORAL_TABLET | Freq: Every evening | ORAL | 3 refills | Status: DC

## 2019-07-20 MED ORDER — LEVOCETIRIZINE DIHYDROCHLORIDE 5 MG PO TABS: 5.0000 mg | ORAL_TABLET | Freq: Every evening | ORAL | 3 refills | Status: DC

## 2019-07-20 NOTE — Telephone Encounter (Signed)
Left message advising rx's sent to [facility] per request and to call back with any questions or concerns.

## 2019-09-12 ENCOUNTER — Other Ambulatory Visit: Payer: Self-pay | Admitting: Family Medicine

## 2019-09-12 DIAGNOSIS — J302 Other seasonal allergic rhinitis: Secondary | ICD-10-CM

## 2019-10-26 ENCOUNTER — Ambulatory Visit (INDEPENDENT_AMBULATORY_CARE_PROVIDER_SITE_OTHER): Payer: Medicare HMO | Admitting: Family Medicine

## 2019-10-26 ENCOUNTER — Encounter: Payer: Self-pay | Admitting: Family Medicine

## 2019-10-26 ENCOUNTER — Other Ambulatory Visit: Payer: Self-pay

## 2019-10-26 VITALS — BP 130/81 | HR 67 | Temp 97.9°F | Resp 20 | Ht 69.0 in | Wt 216.0 lb

## 2019-10-26 DIAGNOSIS — J301 Allergic rhinitis due to pollen: Secondary | ICD-10-CM

## 2019-10-26 DIAGNOSIS — Z23 Encounter for immunization: Secondary | ICD-10-CM | POA: Diagnosis not present

## 2019-10-26 DIAGNOSIS — R197 Diarrhea, unspecified: Secondary | ICD-10-CM | POA: Diagnosis not present

## 2019-10-26 LAB — CBC WITH DIFFERENTIAL/PLATELET
Basophils Absolute: 0.1 10*3/uL (ref 0.0–0.2)
Basos: 1 %
EOS (ABSOLUTE): 0.2 10*3/uL (ref 0.0–0.4)
Eos: 3 %
Hematocrit: 47.4 % (ref 37.5–51.0)
Hemoglobin: 16 g/dL (ref 13.0–17.7)
Immature Grans (Abs): 0 10*3/uL (ref 0.0–0.1)
Immature Granulocytes: 0 %
Lymphocytes Absolute: 3 10*3/uL (ref 0.7–3.1)
Lymphs: 35 %
MCH: 29.6 pg (ref 26.6–33.0)
MCHC: 33.8 g/dL (ref 31.5–35.7)
MCV: 88 fL (ref 79–97)
Monocytes Absolute: 0.6 10*3/uL (ref 0.1–0.9)
Monocytes: 7 %
Neutrophils Absolute: 4.7 10*3/uL (ref 1.4–7.0)
Neutrophils: 54 %
Platelets: 217 10*3/uL (ref 150–450)
RBC: 5.41 x10E6/uL (ref 4.14–5.80)
RDW: 12.4 % (ref 11.6–15.4)
WBC: 8.7 10*3/uL (ref 3.4–10.8)

## 2019-10-26 LAB — CMP14+EGFR
ALT: 34 IU/L (ref 0–44)
AST: 26 IU/L (ref 0–40)
Albumin/Globulin Ratio: 1.6 (ref 1.2–2.2)
Albumin: 4.3 g/dL (ref 3.7–4.7)
Alkaline Phosphatase: 95 IU/L (ref 44–121)
BUN/Creatinine Ratio: 13 (ref 10–24)
BUN: 15 mg/dL (ref 8–27)
Bilirubin Total: 0.6 mg/dL (ref 0.0–1.2)
CO2: 23 mmol/L (ref 20–29)
Calcium: 9.5 mg/dL (ref 8.6–10.2)
Chloride: 103 mmol/L (ref 96–106)
Creatinine, Ser: 1.14 mg/dL (ref 0.76–1.27)
GFR calc Af Amer: 74 mL/min/{1.73_m2} (ref >59)
GFR calc non Af Amer: 64 mL/min/{1.73_m2} (ref >59)
Globulin, Total: 2.7 g/dL (ref 1.5–4.5)
Glucose: 100 mg/dL — ABNORMAL HIGH (ref 65–99)
Potassium: 4.7 mmol/L (ref 3.5–5.2)
Sodium: 139 mmol/L (ref 134–144)
Total Protein: 7 g/dL (ref 6.0–8.5)

## 2019-10-26 MED ORDER — BETAMETHASONE SOD PHOS & ACET 6 (3-3) MG/ML IJ SUSP: 6.0000 mg | Freq: Once | INTRAMUSCULAR | Status: DC

## 2019-10-26 NOTE — Progress Notes (Signed)
Subjective:  Patient ID: Ronald Berger, male    DOB: Nov 23, 1945  Age: 74 y.o. MRN: [id number]  CC: Diarrhea   HPI Ronald Berger presents for watery BMs 5-7 times a day. Ongoing for 6-7 months. Not painful. No abdominal and no rectal pain. Denies bleeding from the rectum. Sometimes he will have to get up in the night for a loose bowel movement. He had a colonoscopy several months ago, but the symptoms had already started at that time. No recent chsnges sin medication.   Pt. Having significant sneezing, drainage and rhinorrhea.     Depression screen [facility] 2/9 10/26/2019 06/02/2019 04/01/2019  Decreased Interest 0 0 0  Down, Depressed, Hopeless 0 0 0  PHQ - 2 Score 0 0 0  Altered sleeping - - -  Tired, decreased energy - - -  Change in appetite - - -  Feeling bad or failure about yourself  - - -  Trouble concentrating - - -  Moving slowly or fidgety/restless - - -  Suicidal thoughts - - -  PHQ-9 Score - - -    History Ronald Berger has a past medical history of Allergic rhinitis, Allergy, Asthma due to environmental allergies, Bilateral carpal tunnel syndrome (12/08/2018), CAD, multiple vessel (CARDIOLOGIST-  DR HILTY), Chronic kidney disease, Dyslipidemia, Family history of bone cancer, GERD (gastroesophageal reflux disease), History of kidney stones, History of non-ST elevation myocardial infarction (NSTEMI), History of palpitations, Hyperlipidemia, Hypertension, Insomnia (12/08/2018), Myocardial infarction ([place]) (2015), OSA (obstructive sleep apnea), Right knee meniscal tear, S/P drug eluting coronary stent placement, Sinus bradycardia, and Sleep apnea.   He has a past surgical history that includes left heart catheterization with coronary angiogram (N/A, 08/05/2013); Lumbar spine surgery (x3  last one 1980's); Extracorporeal shock wave lithotripsy (x2 last one 2005); Shoulder arthroscopy (Right, 2013); Knee arthroscopy (Right, 05/02/2015); Colonoscopy; and Cataract extraction, bilateral.     His family history includes Alzheimer's disease in his father; Aneurysm in his brother; Bone cancer in his sister and sister; CAD in his sister; Coronary artery disease in his mother; Heart failure in his sister.He reports that he quit smoking about 53 years ago. His smoking use included cigarettes. He quit after 1.00 year of use. He has never used smokeless tobacco. He reports current alcohol use. He reports that he does not use drugs.    ROS Review of Systems  Constitutional: Negative for chills, diaphoresis, fever and unexpected weight change.  HENT: Negative for rhinorrhea and trouble swallowing.   Respiratory: Negative for cough, chest tightness and shortness of breath.   Cardiovascular: Negative for chest pain.  Gastrointestinal: Negative for abdominal distention, blood in stool, constipation, diarrhea, nausea, rectal pain and vomiting.  Genitourinary: Negative for dysuria, flank pain and hematuria.  Musculoskeletal: Negative for arthralgias and joint swelling.  Skin: Negative for rash.  Neurological: Negative for syncope and headaches.    Objective:  BP 130/81   Pulse 67   Temp 97.9 F (36.6 C) (Temporal)   Resp 20   Ht _0  (1.753 m)   Wt 216 lb (98 kg)   SpO2 98%   BMI 31.90 kg/m   BP Readings from Last 3 Encounters:  10/26/19 130/81  06/02/19 137/78  02/10/19 127/78    Wt Readings from Last 3 Encounters:  10/26/19 216 lb (98 kg)  06/02/19 218 lb 12.8 oz (99.2 kg)  04/01/19 210 lb 1.6 oz (95.3 kg)     Physical Exam Vitals reviewed.  Constitutional:      Appearance: He  is well-developed.  HENT:     Head: Normocephalic and atraumatic.     Right Ear: Tympanic membrane and external ear normal. No decreased hearing noted.     Left Ear: Tympanic membrane and external ear normal. No decreased hearing noted.     Mouth/Throat:     Pharynx: No oropharyngeal exudate or posterior oropharyngeal erythema.  Eyes:     Pupils: Pupils are equal, round, and reactive to  light.  Cardiovascular:     Rate and Rhythm: Normal rate and regular rhythm.     Heart sounds: No murmur heard.   Pulmonary:     Effort: No respiratory distress.     Breath sounds: Normal breath sounds.  Abdominal:     General: Bowel sounds are normal.     Palpations: Abdomen is soft. There is no mass.     Tenderness: There is no abdominal tenderness.  Musculoskeletal:     Cervical back: Normal range of motion and neck supple.       Assessment & Plan:   Ronald Berger was seen today for diarrhea.  Diagnoses and all orders for this visit:  Need for immunization against influenza -     Flu Vaccine QUAD High Dose(Fluad)  Diarrhea, unspecified type -     CBC with Differential/Platelet -     CMP14+EGFR -     Giardia/Cryptosporidium EIA -     Cdiff NAA+O+P+Stool Culture  Seasonal allergic rhinitis due to pollen -     betamethasone acetate-betamethasone sodium phosphate (CELESTONE) injection 6 mg   He is to hold the atorvastatin until further notice since diarrhea can occur with its use.     I have discontinued Islam Eichinger. Kalb's atorvastatin. I am also having him maintain his aspirin, nitroGLYCERIN, ibuprofen, diclofenac Sodium, traZODone, omeprazole, metoprolol tartrate, levocetirizine, and fluticasone. We will continue to administer betamethasone acetate-betamethasone sodium phosphate.  Allergies as of 10/26/2019      Reactions   Oxycodone Nausea And Vomiting      Medication List       Accurate as of October 26, 2019 11:59 PM. If you have any questions, ask your nurse or doctor.        STOP taking these medications   atorvastatin 40 MG tablet Commonly known as: LIPITOR Stopped by: Claretta Fraise, MD     TAKE these medications   Advil 200 MG tablet Generic drug: ibuprofen Take 200 mg by mouth every 6 (six) hours as needed.   aspirin 81 MG chewable tablet Chew 1 tablet (81 mg total) by mouth daily.   diclofenac Sodium 1 % Gel Commonly known as: VOLTAREN APPLY  4 GRAMS TOPICALLY 4 (FOUR) TIMES DAILY.   fluticasone 50 MCG/ACT nasal spray Commonly known as: FLONASE USE 2 SPRAYS IN EACH NOSTRIL EVERY DAY   levocetirizine 5 MG tablet Commonly known as: XYZAL Take 1 tablet (5 mg total) by mouth every evening.   metoprolol tartrate 25 MG tablet Commonly known as: LOPRESSOR Take 0.5 tablets (12.5 mg total) by mouth 2 (two) times daily.   nitroGLYCERIN 0.4 MG SL tablet Commonly known as: NITROSTAT Place 1 tablet (0.4 mg total) under the tongue every 5 (five) minutes as needed for chest pain.   omeprazole 20 MG capsule Commonly known as: PRILOSEC Take 1 capsule (20 mg total) by mouth every evening.   traZODone 50 MG tablet Commonly known as: DESYREL Take 1 tablet (50 mg total) by mouth at bedtime as needed for sleep.       Follow-up: Return in  about 2 weeks (around 11/09/2019).  Claretta Fraise, M.D.

## 2019-10-29 NOTE — Progress Notes (Signed)
Hello Ronald Berger,  Your lab result is normal and/or stable.Some minor variations that are not significant are commonly marked abnormal, but do not represent any medical problem for you.  Best regards, Ambria Mayfield, M.D.

## 2019-10-30 ENCOUNTER — Encounter: Payer: Self-pay | Admitting: Family Medicine

## 2019-11-09 ENCOUNTER — Ambulatory Visit: Payer: Medicare HMO | Admitting: Family Medicine

## 2019-11-18 ENCOUNTER — Other Ambulatory Visit: Payer: Self-pay

## 2019-11-18 ENCOUNTER — Encounter: Payer: Self-pay | Admitting: Family Medicine

## 2019-11-18 ENCOUNTER — Ambulatory Visit (INDEPENDENT_AMBULATORY_CARE_PROVIDER_SITE_OTHER): Payer: Medicare HMO | Admitting: Family Medicine

## 2019-11-18 VITALS — BP 140/70 | HR 59 | Temp 97.9°F | Ht 69.0 in | Wt 218.5 lb

## 2019-11-18 DIAGNOSIS — J302 Other seasonal allergic rhinitis: Secondary | ICD-10-CM

## 2019-11-18 DIAGNOSIS — K529 Noninfective gastroenteritis and colitis, unspecified: Secondary | ICD-10-CM

## 2019-11-18 DIAGNOSIS — J4540 Moderate persistent asthma, uncomplicated: Secondary | ICD-10-CM | POA: Diagnosis not present

## 2019-11-18 DIAGNOSIS — Z860100 Personal history of colon polyps, unspecified: Secondary | ICD-10-CM

## 2019-11-18 DIAGNOSIS — Z91030 Bee allergy status: Secondary | ICD-10-CM

## 2019-11-18 MED ORDER — ALBUTEROL SULFATE HFA 108 (90 BASE) MCG/ACT IN AERS: 2.0000 | INHALATION_SPRAY | Freq: Four times a day (QID) | RESPIRATORY_TRACT | 2 refills | Status: DC | PRN

## 2019-11-18 MED ORDER — EPINEPHRINE 0.3 MG/0.3ML IJ SOAJ: 0.3000 mg | INTRAMUSCULAR | 1 refills | Status: DC | PRN

## 2019-11-18 MED ORDER — BECLOMETHASONE DIPROPIONATE 80 MCG/ACT IN AERS: 2.0000 | INHALATION_SPRAY | Freq: Two times a day (BID) | RESPIRATORY_TRACT | 12 refills | Status: DC

## 2019-11-18 NOTE — Progress Notes (Signed)
Subjective: CC: diarrhea PCP: Loman Brooklyn, FNP  QIH:[id number] Ronald Berger is a 74 y.o. male presenting to clinic today for:  1. Diarrhea Ronald Berger reports diarrhea for the last 6-7 months. He usually has multiple bowel movements in the morning until about 10. He BMs start as applesauce consistency as soon as he wakes up at 6 am. They get looser as the morning goes on. His last BM of the day ends up as chicken broth consistency. He usually no more BMs after the morning. He had a colonoscopy in January and had polyps removed. He has blood work done 2 weeks ago that was normal. He reports that he is unable to collect a stool sample as it makes him gag. He denies nausea, vomiting, abdominal pain, or blood in his stool. Diarrhea occurs regardless of what he eats.   2. Allergies Ronald Berger reports a history of seasonal allergies and asthma. He as had allergy shots and inhalers in the past. He is currently taking flonase and Xyzal. He reports sneezing, cough, nasal congestion, runny nose, and postnasal drip. His allergies are worse outside. He does have some shortness at times with the coughing daily. He occasionally has heard wheezing. He does wake up coughing at night sometimes. He also reports a severe allergy to bee stings that have required ED trips in the past. He does not currently have an epi pen due to the cost. He reports his allergy and asthma symptoms were controlled after retiring, but he has been working outside a lot lately.   Relevant past medical, surgical, family, and social history reviewed and updated as indicated.  Allergies and medications reviewed and updated.  Allergies  Allergen Reactions  . Oxycodone Nausea And Vomiting   Past Medical History:  Diagnosis Date  . Allergic rhinitis   . Allergy   . 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  . Bilateral carpal tunnel syndrome 12/08/2018  . CAD,  multiple vessel CARDIOLOGIST-  DR HILTY   a. NSTEMI 07/2013 - DES to mLCx, DES to prox OM1, DES to mLAD with nonobstructive RCA stenosis, EF 55-65%.  . Chronic kidney disease    kidney stones   . Dyslipidemia   . Family history of bone cancer   . GERD (gastroesophageal reflux disease)   . History of kidney stones   . History of non-ST elevation myocardial infarction (NSTEMI)    07/ 2015  S/P  DES X3  . History of palpitations   . Hyperlipidemia   . Hypertension   . Insomnia 12/08/2018   x many years  . Myocardial infarction ([place]) 2015   NSTEMI- 3 stents   . OSA (obstructive sleep apnea)    cpap intolerant  . Right knee meniscal tear   . S/P drug eluting coronary stent placement    07/ 2015  x3  to mLCFX, OM1, mLAD  . Sinus bradycardia   . Sleep apnea    no cpap     Current Outpatient Medications:  .  aspirin 81 MG chewable tablet, Chew 1 tablet (81 mg total) by mouth daily., Disp: , Rfl:  .  atorvastatin (LIPITOR) 40 MG tablet, Take 40 mg by mouth daily., Disp: , Rfl:  .  diclofenac Sodium (VOLTAREN) 1 % GEL, APPLY 4 GRAMS TOPICALLY 4 (FOUR) TIMES DAILY., Disp: 1400 g, Rfl: 3 .  fluticasone (FLONASE) 50 MCG/ACT nasal spray, USE 2 SPRAYS IN EACH NOSTRIL  EVERY DAY, Disp: 48 g, Rfl: 1 .  ibuprofen (ADVIL) 200 MG tablet, Take 200 mg by mouth every 6 (six) hours as needed., Disp: , Rfl:  .  levocetirizine (XYZAL) 5 MG tablet, Take 1 tablet (5 mg total) by mouth every evening., Disp: 90 tablet, Rfl: 3 .  metoprolol tartrate (LOPRESSOR) 25 MG tablet, Take 0.5 tablets (12.5 mg total) by mouth 2 (two) times daily., Disp: 90 tablet, Rfl: 3 .  nitroGLYCERIN (NITROSTAT) 0.4 MG SL tablet, Place 1 tablet (0.4 mg total) under the tongue every 5 (five) minutes as needed for chest pain., Disp: 25 tablet, Rfl: 12 .  omeprazole (PRILOSEC) 20 MG capsule, Take 1 capsule (20 mg total) by mouth every evening., Disp: 90 capsule, Rfl: 3 .  traZODone (DESYREL) 50 MG tablet, Take 1 tablet (50 mg total) by  mouth at bedtime as needed for sleep., Disp: 90 tablet, Rfl: 2  Current Facility-Administered Medications:  .  betamethasone acetate-betamethasone sodium phosphate (CELESTONE) injection 6 mg, 6 mg, Intramuscular, Once, Claretta Fraise, MD Social History   Socioeconomic History  . Marital status: Married    Spouse name: Joelene Millin  . Number of children: 1  . Years of education: 54  . Highest education level: Some college, no degree  Occupational History    Comment: Retired - prior Oceanographer  Tobacco Use  . Smoking status: Former Smoker    Years: 1.00    Types: Cigarettes    Quit date: 04/27/1966    Years since quitting: 53.5  . Smokeless tobacco: Never Used  Vaping Use  . Vaping Use: Never used  Substance and Sexual Activity  . Alcohol use: Yes    Comment: occasional  . Drug use: No  . Sexual activity: Yes  Other Topics Concern  . Not on file  Social History Narrative  . Not on file   Social Determinants of Health   Financial Resource Strain:   . Difficulty of Paying Living Expenses: Not on file  Food Insecurity:   . Worried About Charity fundraiser in the Last Year: Not on file  . Ran Out of Food in the Last Year: Not on file  Transportation Needs:   . Lack of Transportation (Medical): Not on file  . Lack of Transportation (Non-Medical): Not on file  Physical Activity:   . Days of Exercise per Week: Not on file  . Minutes of Exercise per Session: Not on file  Stress:   . Feeling of Stress : Not on file  Social Connections:   . Frequency of Communication with Friends and Family: Not on file  . Frequency of Social Gatherings with Friends and Family: Not on file  . Attends Religious Services: Not on file  . Active Member of Clubs or Organizations: Not on file  . Attends Archivist Meetings: Not on file  . Marital Status: Not on file  Intimate Partner Violence:   . Fear of Current or Ex-Partner: Not on file  . Emotionally Abused: Not on file  . Physically  Abused: Not on file  . Sexually Abused: Not on file   Family History  Problem Relation Age of Onset  . Coronary artery disease Mother        CABG  . CAD Sister   . Bone cancer Sister   . Alzheimer's disease Father   . Aneurysm Brother        brain  . Heart failure Sister   . Bone cancer Sister   . Colon cancer  Neg Hx   . Colon polyps Neg Hx   . Esophageal cancer Neg Hx   . Rectal cancer Neg Hx   . Stomach cancer Neg Hx     Review of Systems  Per HPI.   Objective: Office vital signs reviewed. BP 140/70   Pulse (!) 59   Temp 97.9 F (36.6 C) (Temporal)   Ht '5\' 9"'  (1.753 m)   Wt 218 lb 8 oz (99.1 kg)   BMI 32.27 kg/m   Physical Examination:  Physical Exam Vitals and nursing note reviewed.  Constitutional:      Appearance: Normal appearance.  HENT:     Head: Normocephalic and atraumatic.     Right Ear: Tympanic membrane, ear canal and external ear normal.     Left Ear: Tympanic membrane, ear canal and external ear normal.     Nose: Nose normal.     Mouth/Throat:     Mouth: Mucous membranes are moist.     Pharynx: Oropharynx is clear.  Eyes:     Extraocular Movements: Extraocular movements intact.     Conjunctiva/sclera: Conjunctivae normal.  Cardiovascular:     Rate and Rhythm: Normal rate and regular rhythm.     Heart sounds: Normal heart sounds. No murmur heard.   Pulmonary:     Effort: Pulmonary effort is normal. No respiratory distress.     Breath sounds: Normal breath sounds. No wheezing.  Abdominal:     General: Abdomen is flat. There is no distension.     Palpations: Abdomen is soft.     Tenderness: There is no abdominal tenderness. There is no right CVA tenderness, left CVA tenderness, guarding or rebound.  Skin:    General: Skin is warm and dry.  Neurological:     General: No focal deficit present.     Mental Status: He is alert and oriented to person, place, and time.  Psychiatric:        Mood and Affect: Mood normal.        Behavior:  Behavior normal.      Results for orders placed or performed in visit on 10/26/19  CBC with Differential/Platelet  Result Value Ref Range   WBC 8.7 3.4 - 10.8 x10E3/uL   RBC 5.41 4.14 - 5.80 x10E6/uL   Hemoglobin 16.0 13.0 - 17.7 g/dL   Hematocrit 47.4 37.5 - 51.0 %   MCV 88 79 - 97 fL   MCH 29.6 26.6 - 33.0 pg   MCHC 33.8 31 - 35 g/dL   RDW 12.4 11.6 - 15.4 %   Platelets 217 150 - 450 x10E3/uL   Neutrophils 54 Not Estab. %   Lymphs 35 Not Estab. %   Monocytes 7 Not Estab. %   Eos 3 Not Estab. %   Basos 1 Not Estab. %   Neutrophils Absolute 4.7 1.40 - 7.00 x10E3/uL   Lymphocytes Absolute 3.0 0 - 3 x10E3/uL   Monocytes Absolute 0.6 0 - 0 x10E3/uL   EOS (ABSOLUTE) 0.2 0.0 - 0.4 x10E3/uL   Basophils Absolute 0.1 0 - 0 x10E3/uL   Immature Granulocytes 0 Not Estab. %   Immature Grans (Abs) 0.0 0.0 - 0.1 x10E3/uL  CMP14+EGFR  Result Value Ref Range   Glucose 100 (H) 65 - 99 mg/dL   BUN 15 8 - 27 mg/dL   Creatinine, Ser 1.14 0.76 - 1.27 mg/dL   GFR calc non Af Amer 64 >59 mL/min/1.73   GFR calc Af Amer 74 >59 mL/min/1.73   BUN/Creatinine Ratio 13  10 - 24   Sodium 139 134 - 144 mmol/L   Potassium 4.7 3.5 - 5.2 mmol/L   Chloride 103 96 - 106 mmol/L   CO2 23 20 - 29 mmol/L   Calcium 9.5 8.6 - 10.2 mg/dL   Total Protein 7.0 6.0 - 8.5 g/dL   Albumin 4.3 3.7 - 4.7 g/dL   Globulin, Total 2.7 1.5 - 4.5 g/dL   Albumin/Globulin Ratio 1.6 1.2 - 2.2   Bilirubin Total 0.6 0.0 - 1.2 mg/dL   Alkaline Phosphatase 95 44 - 121 IU/L   AST 26 0 - 40 IU/L   ALT 34 0 - 44 IU/L     Assessment/ Plan: Neftali was seen today for diarrhea.  Diagnoses and all orders for this visit:  Chronic diarrhea Recent lab work done was unremarkable. Patient unable to provide stool sample. Stressed need for hydration.  -     Ambulatory referral to Gastroenterology  History of colon polyps -     Ambulatory referral to Gastroenterology  Moderate persistent asthma without complication Uncontrolled.  Medications added as below. Discussed stepping up therapy if still uncontrolled. Follow up in 4 weeks to reassess.  -     beclomethasone (QVAR) 80 MCG/ACT inhaler; Inhale 2 puffs into the lungs 2 (two) times daily. -     albuterol (VENTOLIN HFA) 108 (90 Base) MCG/ACT inhaler; Inhale 2 puffs into the lungs every 6 (six) hours as needed for wheezing or shortness of breath. -     Ambulatory referral to Allergy  Bee sting allergy -     EPINEPHrine 0.3 mg/0.3 mL IJ SOAJ injection; Inject 0.3 mg into the muscle as needed for anaphylaxis. -     Ambulatory referral to Allergy  Seasonal allergies Continue Xyzal and Flonase.  -     Ambulatory referral to Allergy  Follow up in 4 weeks for asthma. Return to office for new or worsening symptoms, or if symptoms persist.   The above assessment and management plan was discussed with the patient. The patient verbalized understanding of and has agreed to the management plan. Patient is aware to call the clinic if symptoms persist or worsen. Patient is aware when to return to the clinic for a follow-up visit. Patient educated on when it is appropriate to go to the emergency department.   Marjorie Smolder, FNP-C [facility] [address] [place], [place] [address] [phone]

## 2019-11-18 NOTE — Patient Instructions (Signed)
http://www.aaaai.org/conditions-and-treatments/asthma">  Asthma, Adult  Asthma is a long-term (chronic) condition that causes recurrent episodes in which the airways become tight and narrow. The airways are the passages that lead from the nose and mouth down into the lungs. Asthma episodes, also called asthma attacks, can cause coughing, wheezing, shortness of breath, and chest pain. The airways can also fill with mucus. During an attack, it can be difficult to breathe. Asthma attacks can range from minor to life threatening. Asthma cannot be cured, but medicines and lifestyle changes can help control it and treat acute attacks. What are the causes? This condition is believed to be caused by inherited (genetic) and environmental factors, but its exact cause is not known. There are many things that can bring on an asthma attack or make asthma symptoms worse (triggers). Asthma triggers are different for each person. Common triggers include:  Mold.  Dust.  Cigarette smoke.  Cockroaches.  Things that can cause allergy symptoms (allergens), such as animal dander or pollen from trees or grass.  Air pollutants such as household cleaners, wood smoke, smog, or chemical odors.  Cold air, weather changes, and winds (which increase molds and pollen in the air).  Strong emotional expressions such as crying or laughing hard.  Stress.  Certain medicines (such as aspirin) or types of medicines (such as beta-blockers).  Sulfites in foods and drinks. Foods and drinks that may contain sulfites include dried fruit, potato chips, and sparkling grape juice.  Infections or inflammatory conditions such as the flu, a cold, or inflammation of the nasal membranes (rhinitis).  Gastroesophageal reflux disease (GERD).  Exercise or strenuous activity. What are the signs or symptoms? Symptoms of this condition may occur right after asthma is triggered or many hours later. Symptoms include:  Wheezing. This can  sound like whistling when you breathe.  Excessive nighttime or early morning coughing.  Frequent or severe coughing with a common cold.  Chest tightness.  Shortness of breath.  Tiredness (fatigue) with minimal activity. How is this diagnosed? This condition is diagnosed based on:  Your medical history.  A physical exam.  Tests, which may include: ? Lung function studies and pulmonary studies (spirometry). These tests can evaluate the flow of air in your lungs. ? Allergy tests. ? Imaging tests, such as X-rays. How is this treated? There is no cure for this condition, but treatment can help control your symptoms. Treatment for asthma usually involves:  Identifying and avoiding your asthma triggers.  Using medicines to control your symptoms. Generally, two types of medicines are used to treat asthma: ? Controller medicines. These help prevent asthma symptoms from occurring. They are usually taken every day. ? Fast-acting reliever or rescue medicines. These quickly relieve asthma symptoms by widening the narrow and tight airways. They are used as needed and provide short-term relief.  Using supplemental oxygen. This may be needed during a severe episode.  Using other medicines, such as: ? Allergy medicines, such as antihistamines, if your asthma attacks are triggered by allergens. ? Immune medicines (immunomodulators). These are medicines that help control the immune system.  Creating an asthma action plan. An asthma action plan is a written plan for managing and treating your asthma attacks. This plan includes: ? A list of your asthma triggers and how to avoid them. ? Information about when medicines should be taken and when their dosage should be changed. ? Instructions about using a device called a peak flow meter. A peak flow meter measures how well the lungs are working   and the severity of your asthma. It helps you monitor your condition. Follow these instructions at  home: Controlling your home environment Control your home environment in the following ways to help avoid triggers and prevent asthma attacks:  Change your heating and air conditioning filter regularly.  Limit your use of fireplaces and wood stoves.  Get rid of pests (such as roaches and mice) and their droppings.  Throw away plants if you see mold on them.  Clean floors and dust surfaces regularly. Use unscented cleaning products.  Try to have someone else vacuum for you regularly. Stay out of rooms while they are being vacuumed and for a short while afterward. If you vacuum, use a dust mask from a hardware store, a double-layered or microfilter vacuum cleaner bag, or a vacuum cleaner with a HEPA filter.  Replace carpet with wood, tile, or vinyl flooring. Carpet can trap dander and dust.  Use allergy-proof pillows, mattress covers, and box spring covers.  Keep your bedroom a trigger-free room.  Avoid pets and keep windows closed when allergens are in the air.  Wash beddings every week in hot water and dry them in a dryer.  Use blankets that are made of polyester or cotton.  Clean bathrooms and kitchens with bleach. If possible, have someone repaint the walls in these rooms with mold-resistant paint. Stay out of the rooms that are being cleaned and painted.  Wash your hands often with soap and water. If soap and water are not available, use hand sanitizer.  Do not allow anyone to smoke in your home. General instructions  Take over-the-counter and prescription medicines only as told by your health care provider. ? Speak with your health care provider if you have questions about how or when to take the medicines. ? Make note if you are requiring more frequent dosages.  Do not use any products that contain nicotine or tobacco, such as cigarettes and e-cigarettes. If you need help quitting, ask your health care provider. Also, avoid being exposed to secondhand smoke.  Use a peak  flow meter as told by your health care provider. Record and keep track of the readings.  Understand and use the asthma action plan to help minimize, or stop an asthma attack, without needing to seek medical care.  Make sure you stay up to date on your yearly vaccinations as told by your health care provider. This may include vaccines for the flu and pneumonia.  Avoid outdoor activities when allergen counts are high and when air quality is low.  Wear a ski mask that covers your nose and mouth during outdoor winter activities. Exercise indoors on cold days if you can.  Warm up before exercising, and take time for a cool-down period after exercise.  Keep all follow-up visits as told by your health care provider. This is important. Where to find more information  For information about asthma, turn to the Centers for Disease Control and Prevention at www.cdc.gov/asthma/faqs.htm  For air quality information, turn to AirNow at https://airnow.gov/ Contact a health care provider if:  You have wheezing, shortness of breath, or a cough even while you are taking medicine to prevent attacks.  The mucus you cough up (sputum) is thicker than usual.  Your sputum changes from clear or white to yellow, green, gray, or bloody.  Your medicines are causing side effects, such as a rash, itching, swelling, or trouble breathing.  You need to use a reliever medicine more than 2-3 times a week.  Your peak   flow reading is still at 50-79% of your personal best after following your action plan for 1 hour.  You have a fever. Get help right away if:  You are getting worse and do not respond to treatment during an asthma attack.  You are short of breath when at rest or when doing very little physical activity.  You have difficulty eating, drinking, or talking.  You have chest pain or tightness.  You develop a fast heartbeat or palpitations.  You have a bluish color to your lips or fingernails.  You  are light-headed or dizzy, or you faint.  Your peak flow reading is less than 50% of your personal best.  You feel too tired to breathe normally. Summary  Asthma is a long-term (chronic) condition that causes recurrent episodes in which the airways become tight and narrow. These episodes can cause coughing, wheezing, shortness of breath, and chest pain.  Asthma cannot be cured, but medicines and lifestyle changes can help control it and treat acute attacks.  Make sure you understand how to avoid triggers and how and when to use your medicines.  Asthma attacks can range from minor to life threatening. Get help right away if you have an asthma attack and do not respond to treatment with your usual rescue medicines. This information is not intended to replace advice given to you by your health care provider. Make sure you discuss any questions you have with your health care provider. Document Revised: 03/11/2018 Document Reviewed: 02/11/2016 Elsevier Patient Education  2020 Elsevier Inc.  

## 2019-11-24 ENCOUNTER — Other Ambulatory Visit: Payer: Self-pay | Admitting: Family Medicine

## 2019-11-24 ENCOUNTER — Other Ambulatory Visit: Payer: Self-pay | Admitting: *Deleted

## 2019-11-24 ENCOUNTER — Telehealth: Payer: Self-pay

## 2019-11-24 MED ORDER — ARNUITY ELLIPTA 200 MCG/ACT IN AEPB: 1.0000 | INHALATION_SPRAY | Freq: Every day | RESPIRATORY_TRACT | 2 refills | Status: DC

## 2019-11-24 NOTE — Telephone Encounter (Signed)
Change sent to [facility]

## 2019-11-24 NOTE — Telephone Encounter (Signed)
WHAT DOSE?????

## 2019-11-24 NOTE — Telephone Encounter (Signed)
Ok to change to [employer].

## 2019-11-24 NOTE — Telephone Encounter (Signed)
200 mcg 1 puff daily

## 2019-11-24 NOTE — Telephone Encounter (Signed)
[employer] has requested an alternative for Qvar. Alternatives include Flovent HFA or Arnuity Ellipta  Fax back to [phone]

## 2019-11-24 NOTE — Progress Notes (Unsigned)
ar

## 2019-12-08 ENCOUNTER — Telehealth: Payer: Self-pay

## 2019-12-08 NOTE — Telephone Encounter (Signed)
TC to [facility], pt has refill on file Pt aware refills are on file with [facility], he will contact them for his refill

## 2019-12-08 NOTE — Telephone Encounter (Signed)
  Prescription Request  12/08/2019  What is the name of the medication or equipment? Trazadone  Have you contacted your pharmacy to request a refill? (if applicable) yes  Which pharmacy would you like this sent to? [facility]   Patient notified that their request is being sent to the clinical staff for review and that they should receive a response within 2 business days.   Joyce's pt.  Please call pt.  Do not send to [facility].

## 2020-01-03 ENCOUNTER — Other Ambulatory Visit: Payer: Self-pay

## 2020-01-03 ENCOUNTER — Ambulatory Visit
Admission: RE | Admit: 2020-01-03 | Discharge: 2020-01-03 | Disposition: A | Discharge status: Home / Self Care | Payer: Medicare HMO | Source: Ambulatory Visit | Attending: Emergency Medicine | Admitting: Emergency Medicine

## 2020-01-03 ENCOUNTER — Ambulatory Visit (INDEPENDENT_AMBULATORY_CARE_PROVIDER_SITE_OTHER): Payer: Medicare HMO

## 2020-01-03 VITALS — BP 132/81 | HR 58 | Temp 99.1°F | Resp 18 | Ht 69.0 in | Wt 218.3 lb

## 2020-01-03 DIAGNOSIS — R059 Cough, unspecified: Secondary | ICD-10-CM

## 2020-01-03 DIAGNOSIS — R0989 Other specified symptoms and signs involving the circulatory and respiratory systems: Secondary | ICD-10-CM

## 2020-01-03 DIAGNOSIS — R0602 Shortness of breath: Secondary | ICD-10-CM | POA: Diagnosis not present

## 2020-01-03 DIAGNOSIS — Z1152 Encounter for screening for COVID-19: Secondary | ICD-10-CM

## 2020-01-03 MED ORDER — FLUTICASONE PROPIONATE 50 MCG/ACT NA SUSP: 1.0000 | Freq: Every day | NASAL | 0 refills | Status: DC

## 2020-01-03 MED ORDER — CETIRIZINE HCL 10 MG PO TABS: 10.0000 mg | ORAL_TABLET | Freq: Every day | ORAL | 0 refills | Status: DC

## 2020-01-03 MED ORDER — BENZONATATE 100 MG PO CAPS: 100.0000 mg | ORAL_CAPSULE | Freq: Three times a day (TID) | ORAL | 0 refills | Status: DC | PRN

## 2020-01-03 MED ORDER — PREDNISONE 10 MG PO TABS: 20.0000 mg | ORAL_TABLET | Freq: Every day | ORAL | 0 refills | Status: DC

## 2020-01-03 NOTE — ED Provider Notes (Addendum)
[place]   [id number] 01/03/20 Arrival Time: 1829   Chief Complaint  Patient presents with  . Cough     SUBJECTIVE: History from: patient.  Ronald Berger is a 74 y.o. male who who presented to the urgent care with a complaint of cough, chest congestion for the past 1.5-week..  Denies sick exposure to COVID, flu or strep.  Denies recent travel.  Has tried OTC medication without relief.  Denies aggravating factors.  Denies previous symptoms in the past.   Denies fever, chills, fatigue, sinus pain, rhinorrhea, sore throat, SOB, wheezing, chest pain, nausea, changes in bowel or bladder habits.     ROS: As per HPI.  All other pertinent ROS negative.      Past Medical History:  Diagnosis Date  . Allergic rhinitis   . Allergy   . 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  . Bilateral carpal tunnel syndrome 12/08/2018  . 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 NSTEMI 07/2013 - DES to mLCx, DES to prox OM1, DES to mLAD with nonobstructive RCA stenosis, EF 55-65%.  . Chronic kidney disease    kidney stones   . Dyslipidemia   . Family history of bone cancer   . GERD (gastroesophageal reflux disease)   . History of kidney stones   . History of non-ST elevation myocardial infarction (NSTEMI)    07/ 2015  S/P  DES X3  . History of palpitations   . Hyperlipidemia   . Hypertension   . Insomnia 12/08/2018   x many years  . Myocardial infarction ([place]) 2015   NSTEMI- 3 stents   . OSA (obstructive sleep apnea)    cpap intolerant  . Right knee meniscal tear   . S/P drug eluting coronary stent placement    07/ 2015  x3  to mLCFX, OM1, mLAD  . Sinus bradycardia   . Sleep apnea    no cpap    Past Surgical History:  Procedure Laterality Date  . CATARACT EXTRACTION, BILATERAL    . COLONOSCOPY     >10 yrs ago-polyps per pt- we have no report   . EXTRACORPOREAL SHOCK WAVE  LITHOTRIPSY  x2 last one 2005  . KNEE ARTHROSCOPY Right 05/02/2015   Procedure: RIGHT KNEE ARTHROSCOPY WITH medial meniscal DEBRIDEMENT ;  Surgeon: Gaynelle Arabian, MD;  Location: [facility];  Service: Orthopedics;  Laterality: Right;  . LEFT HEART CATHETERIZATION WITH CORONARY ANGIOGRAM N/A 08/05/2013   Procedure: LEFT HEART CATHETERIZATION WITH CORONARY ANGIOGRAM;  Surgeon: Blane Ohara, MD;  Location: [facility] CATH LAB;  Service: Cardiovascular;  Laterality: N/A;  Promus DES's to  mLCFx,  OM1,  mLAD (total 3)/   mRCA 30%,  dLM  20-30%,  preserved LVSF, ef 55-65%  . LUMBAR SPINE SURGERY  x3  last one 1980's  . SHOULDER ARTHROSCOPY Right 2013   Allergies  Allergen Reactions  . Oxycodone Nausea And Vomiting   Current Facility-Administered Medications on File Prior to Encounter  Medication Dose Route Frequency Provider Last Rate Last Admin  . betamethasone acetate-betamethasone sodium phosphate (CELESTONE) injection 6 mg  6 mg Intramuscular Once Claretta Fraise, MD       Current Outpatient Medications on File Prior to Encounter  Medication Sig Dispense Refill  . albuterol (VENTOLIN HFA) 108 (90 Base) MCG/ACT inhaler Inhale 2 puffs into the lungs every 6 (six) hours as needed for wheezing  or shortness of breath. 8 g 2  . aspirin 81 MG chewable tablet Chew 1 tablet (81 mg total) by mouth daily.    Marland Kitchen atorvastatin (LIPITOR) 40 MG tablet Take 40 mg by mouth daily.    . diclofenac Sodium (VOLTAREN) 1 % GEL APPLY 4 GRAMS TOPICALLY 4 (FOUR) TIMES DAILY. 1400 g 3  . EPINEPHrine 0.3 mg/0.3 mL IJ SOAJ injection Inject 0.3 mg into the muscle as needed for anaphylaxis. 1 each 1  . Fluticasone Furoate (ARNUITY ELLIPTA) 200 MCG/ACT AEPB Inhale 1 puff into the lungs daily. 30 each 2  . ibuprofen (ADVIL) 200 MG tablet Take 200 mg by mouth every 6 (six) hours as needed.    Marland Kitchen levocetirizine (XYZAL) 5 MG tablet Take 1 tablet (5 mg total) by mouth every evening. 90 tablet 3  . metoprolol tartrate  (LOPRESSOR) 25 MG tablet Take 0.5 tablets (12.5 mg total) by mouth 2 (two) times daily. 90 tablet 3  . nitroGLYCERIN (NITROSTAT) 0.4 MG SL tablet Place 1 tablet (0.4 mg total) under the tongue every 5 (five) minutes as needed for chest pain. 25 tablet 12  . omeprazole (PRILOSEC) 20 MG capsule Take 1 capsule (20 mg total) by mouth every evening. 90 capsule 3  . traZODone (DESYREL) 50 MG tablet Take 1 tablet (50 mg total) by mouth at bedtime as needed for sleep. 90 tablet 2   Social History   Socioeconomic History  . Marital status: Married    Spouse name: Joelene Millin  . Number of children: 1  . Years of education: 66  . Highest education level: Some college, no degree  Occupational History    Comment: Retired - prior Oceanographer  Tobacco Use  . Smoking status: Former Smoker    Years: 1.00    Types: Cigarettes    Quit date: 04/27/1966    Years since quitting: 53.7  . Smokeless tobacco: Never Used  Vaping Use  . Vaping Use: Never used  Substance and Sexual Activity  . Alcohol use: Yes    Comment: occasional  . Drug use: No  . Sexual activity: Yes  Other Topics Concern  . Not on file  Social History Narrative  . Not on file   Social Determinants of Health   Financial Resource Strain: Not on file  Food Insecurity: Not on file  Transportation Needs: Not on file  Physical Activity: Not on file  Stress: Not on file  Social Connections: Not on file  Intimate Partner Violence: Not on file   Family History  Problem Relation Age of Onset  . Coronary artery disease Mother        CABG  . CAD Sister   . Bone cancer Sister   . Alzheimer's disease Father   . Aneurysm Brother        brain  . Heart failure Sister   . Bone cancer Sister   . Colon cancer Neg Hx   . Colon polyps Neg Hx   . Esophageal cancer Neg Hx   . Rectal cancer Neg Hx   . Stomach cancer Neg Hx     OBJECTIVE:  Vitals:   01/03/20 1107 01/03/20 1108  BP:  132/81  Pulse:  (!) 58  Resp:  18  Temp:  99.1 F (37.3  C)  TempSrc:  Oral  SpO2:  98%  Weight: 218 lb 4.1 oz (99 kg)   Height: 5\' 9"  (1.753 m)      General appearance: alert; appears fatigued, but nontoxic; speaking in full sentences  and tolerating own secretions HEENT: NCAT; Ears: EACs clear, TMs pearly gray; Eyes: PERRL.  EOM grossly intact. Sinuses: nontender; Nose: nares patent without rhinorrhea, Throat: oropharynx clear, tonsils non erythematous or enlarged, uvula midline  Neck: supple without LAD Lungs: unlabored respirations, symmetrical air entry; cough: moderate; no respiratory distress; CTAB Heart: regular rate and rhythm.  Radial pulses 2+ symmetrical bilaterally Skin: warm and dry Psychological: alert and cooperative; normal mood and affect  LABS:  No results found for this or any previous visit (from the past 24 hour(s)).   RADIOLOGY  DG Chest 2 View  Result Date: 01/03/2020 CLINICAL DATA:  Cough and shortness of breath. EXAM: CHEST - 2 VIEW COMPARISON:  02/14/2014 FINDINGS: The heart size and mediastinal contours are within normal limits. There is no evidence of pulmonary edema, consolidation, pneumothorax, nodule or pleural fluid. The visualized skeletal structures are unremarkable. IMPRESSION: No active cardiopulmonary disease. Electronically Signed   By: Aletta Edouard M.D.   On: 01/03/2020 11:27    Chest X-ray is negative for cardiopulmonary disease.  I have reviewed the x-ray myself and the radiologist interpretation.  I am in agreement with the radiologist interpretation.   ASSESSMENT & PLAN:  1. Cough   2. Encounter for screening for COVID-19   3. Chest congestion     Meds ordered this encounter  Medications  . fluticasone (FLONASE) 50 MCG/ACT nasal spray    Sig: Place 1 spray into both nostrils daily for 14 days.    Dispense:  16 g    Refill:  0  . cetirizine (ZYRTEC ALLERGY) 10 MG tablet    Sig: Take 1 tablet (10 mg total) by mouth daily.    Dispense:  30 tablet    Refill:  0  . benzonatate  (TESSALON) 100 MG capsule    Sig: Take 1 capsule (100 mg total) by mouth 3 (three) times daily as needed for cough.    Dispense:  30 capsule    Refill:  0  . predniSONE (DELTASONE) 10 MG tablet    Sig: Take 2 tablets (20 mg total) by mouth daily.    Dispense:  15 tablet    Refill:  0    Discharge instructions  COVID-19, flu A/B testing ordered.  It will take between 2-7 days for test results.  Someone will contact you regarding abnormal results.    In the meantime: You should remain isolated in your home for 10 days from symptom onset AND greater than 24 hours after symptoms resolution (absence of fever without the use of fever-reducing medication and improvement in respiratory symptoms), whichever is longer Get plenty of rest and push fluids Tessalon Perles prescribed for cough/no more than 6 tabs in 24 hours Zyrtec for nasal congestion, runny nose, and/or sore throat Flonase for nasal congestion and runny nose Prednisone was prescribed Use medications daily for symptom relief Use OTC medications like ibuprofen or tylenol as needed fever or pain Call or go to the ED if you have any new or worsening symptoms such as fever, worsening cough, shortness of breath, chest tightness, chest pain, turning blue, changes in mental status, etc...   Reviewed expectations re: course of current medical issues. Questions answered. Outlined signs and symptoms indicating need for more acute intervention. Patient verbalized understanding. After Visit Summary given.         Emerson Monte, FNP 01/03/20 1144    Emerson Monte, FNP 01/03/20 1147

## 2020-01-03 NOTE — ED Triage Notes (Signed)
Cough and congestion x 1 1/2 weeks. Feels like congestion has went down in his chest.

## 2020-01-03 NOTE — Discharge Instructions (Addendum)
COVID-19, flu A/B testing ordered.  It will take between 2-7 days for test results.  Someone will contact you regarding abnormal results.    In the meantime: You should remain isolated in your home for 10 days from symptom onset AND greater than 24  hours after symptoms resolution (absence of fever without the use of fever-reducing medication and improvement in respiratory symptoms), whichever is longer Get plenty of rest and push fluids Tessalon Perles prescribed for cough/no more than 6 tabs in 24 hours Zyrtec for nasal congestion, runny nose, and/or sore throat Flonase for nasal congestion and runny nose Prednisone was prescribed Use medications daily for symptom relief Use OTC medications like ibuprofen or tylenol as needed fever or pain Call or go to the ED if you have any new or worsening symptoms such as fever, worsening cough, shortness of breath, chest tightness, chest pain, turning blue, changes in mental status, etc..Marland Kitchen

## 2020-01-04 LAB — COVID-19, FLU A+B NAA
Influenza A, NAA: NOT DETECTED
Influenza B, NAA: NOT DETECTED
SARS-CoV-2, NAA: NOT DETECTED

## 2020-01-11 ENCOUNTER — Encounter: Payer: Self-pay | Admitting: Allergy & Immunology

## 2020-01-11 ENCOUNTER — Ambulatory Visit (INDEPENDENT_AMBULATORY_CARE_PROVIDER_SITE_OTHER): Payer: Medicare HMO | Admitting: Allergy & Immunology

## 2020-01-11 ENCOUNTER — Other Ambulatory Visit: Payer: Self-pay

## 2020-01-11 VITALS — BP 142/80 | HR 55 | Temp 98.0°F | Resp 16 | Ht 68.9 in | Wt 216.6 lb

## 2020-01-11 DIAGNOSIS — J302 Other seasonal allergic rhinitis: Secondary | ICD-10-CM | POA: Insufficient documentation

## 2020-01-11 DIAGNOSIS — J3089 Other allergic rhinitis: Secondary | ICD-10-CM | POA: Diagnosis not present

## 2020-01-11 DIAGNOSIS — J4530 Mild persistent asthma, uncomplicated: Secondary | ICD-10-CM | POA: Insufficient documentation

## 2020-01-11 MED ORDER — BUDESONIDE-FORMOTEROL FUMARATE 80-4.5 MCG/ACT IN AERO: 2.0000 | INHALATION_SPRAY | Freq: Two times a day (BID) | RESPIRATORY_TRACT | 5 refills | Status: DC

## 2020-01-11 MED ORDER — MONTELUKAST SODIUM 10 MG PO TABS: 10.0000 mg | ORAL_TABLET | Freq: Every day | ORAL | 5 refills | Status: DC

## 2020-01-11 MED ORDER — FLUTICASONE PROPIONATE 50 MCG/ACT NA SUSP: 1.0000 | Freq: Every day | NASAL | 5 refills | Status: DC

## 2020-01-11 NOTE — Progress Notes (Signed)
NEW PATIENT  Date of Service/Encounter:  01/11/20  Referring provider: Loman Brooklyn, FNP   Assessment:   Mild persistent asthma, uncomplicated  Chronic rhinitis (grasses and indoor molds)  Plan/Recommendations:   1. Mild persistent asthma, uncomplicated - Lung testing looked mostly normal, but it did improve with albuterol treatment. - Since you are needing your rescue inhaler so often, we need to start you on a daily controller medication. - Let's start Symbicort 80/4.5 mcg two puffs twice daily (contains a long acting albuterol combined with an inhaled steroid). - Spacer sample and demonstration provided. - Daily controller medication(s): Symbicort 80/4.1mcg two puffs twice daily with spacer - Prior to physical activity: albuterol 2 puffs 10-15 minutes before physical activity. - Rescue medications: albuterol 4 puffs every 4-6 hours as needed - Asthma control goals:  * Full participation in all desired activities (may need albuterol before activity) * Albuterol use two time or less a week on average (not counting use with activity) * Cough interfering with sleep two time or less a month * Oral steroids no more than once a year * No hospitalizations  2. Chronic rhinitis - Testing today showed: grasses and indoor molds - Copy of test results provided.  - Avoidance measures provided. - Continue with: Xyzal (levocetirizine) 5mg  tablet once daily - Start taking: Singulair (montelukast) 10mg  daily and Flonase (fluticasone) one spray per nostril daily  - Montelukast can cause weird dreams and irritability, so watch out for that (this is rare, however) - You can use an extra dose of the antihistamine, if needed, for breakthrough symptoms.  - Consider nasal saline rinses 1-2 times daily to remove allergens from the nasal cavities as well as help with mucous clearance (this is especially helpful to do before the nasal sprays are given) - Consider allergy shots as a means of  long-term control. - Allergy shots "re-train" and "reset" the immune system to ignore environmental allergens and decrease the resulting immune response to those allergens (sneezing, itchy watery eyes, runny nose, nasal congestion, etc).    - Allergy shots improve symptoms in 75-85% of patients.  - We can discuss more at the next appointment if the medications are not working for you.   3. Return in about 6 weeks (around 02/22/2020).    Subjective:   Ronald Berger is a 74 y.o. male presenting today for evaluation of  Chief Complaint  Patient presents with  . Asthma    Ronald Berger has a history of the following: Patient Active Problem List   Diagnosis Date Noted  . Mild persistent asthma, uncomplicated 67/89/3810  . Seasonal and perennial allergic rhinitis 01/11/2020  . Genetic testing 03/11/2019  . Family history of bone cancer   . Insomnia 12/08/2018  . Bilateral carpal tunnel syndrome 12/08/2018  . Adenomatous polyps 12/08/2018  . OSA (obstructive sleep apnea) 04/30/2017  . Palpitations 08/29/2015  . CAD in native artery 08/23/2013  . Hyperlipidemia LDL goal <70 08/23/2013  . History of non-ST elevation myocardial infarction (NSTEMI) 08/05/2013  . Allergic rhinitis 01/22/2007  . Esophageal reflux 01/22/2007    History obtained from: chart review and patient.  Ronald Berger was referred by Loman Brooklyn, FNP.     [name] is a 74 year old male, with a history of seasonal allergies, asthma, CKD, HTN, HLD, CAD, MI, who presents to the clinic for chronic cough.   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 His cough is nonproductive, and he does endorse feelings of chest tightness during these episodes. His cough is aggravated when he is getting up to move. His symptoms are not worsened at night. He does have an albuterol inhaler, for seasonal allergy induced asthma that he  has been taking once during the day, and before bedtime for the past two weeks.   [name] does endorse a PMHx of allergies years ago. He was unsure what he was allergic too but does state that he underwent allergy shots and was medicated. He is a [place] native, and lives in [place]. He does have both cats and dogs at his house. He denies a history of food allergies including peanuts, tree nuts, fish, or shellfish. 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 He is exposed to fumes and chemicals at his work environment.   Otherwise, there is no history of other atopic diseases, including food allergies, drug allergies, stinging insect allergies, eczema, urticaria or contact dermatitis. There is no significant infectious history. Vaccinations are up to date.    Past Medical History: Patient Active Problem List   Diagnosis Date Noted  . Mild persistent asthma, uncomplicated 01/11/2020  . Seasonal and perennial allergic rhinitis 01/11/2020  . Genetic testing 03/11/2019  . Family history of bone cancer   . Insomnia 12/08/2018  . Bilateral carpal tunnel syndrome 12/08/2018  . Adenomatous polyps 12/08/2018  . OSA (obstructive sleep apnea) 04/30/2017  . Palpitations 08/29/2015  . CAD in native artery 08/23/2013  . Hyperlipidemia LDL goal <70 08/23/2013  . History of non-ST elevation myocardial infarction (NSTEMI) 08/05/2013  . Allergic rhinitis 01/22/2007  . Esophageal reflux 01/22/2007    Medication List:  Allergies as of 01/11/2020      Reactions   Bee Venom Shortness Of Breath   Oxycodone Nausea And Vomiting   Percocet [oxycodone-acetaminophen] Nausea And Vomiting      Medication List       Accurate as of January 11, 2020  3:27 PM. If you have any questions, ask your nurse or doctor.        Advil 200 MG tablet Generic drug: ibuprofen Take 200 mg by mouth every 6 (six)  hours as needed.   albuterol 108 (90 Base) MCG/ACT inhaler Commonly known as: VENTOLIN HFA Inhale 2 puffs into the lungs every 6 (six) hours as needed for wheezing or shortness of breath.   Arnuity Ellipta 200 MCG/ACT Aepb Generic drug: Fluticasone Furoate Inhale 1 puff into the lungs daily.   aspirin 81 MG chewable tablet Chew 1 tablet (81 mg total) by mouth daily.   atorvastatin 40 MG tablet Commonly known as: LIPITOR Take 40 mg by mouth daily.   benzonatate 100 MG capsule Commonly known as: TESSALON Take 1 capsule (100 mg total) by mouth 3 (three) times daily as needed for cough.   budesonide-formoterol 80-4.5 MCG/ACT inhaler Commonly known as: Symbicort Inhale 2 puffs into the lungs 2 (two) times daily. Started by: Alfonse Spruce, MD   cetirizine 10 MG tablet Commonly known as: ZyrTEC Allergy Take 1 tablet (10 mg total) by mouth daily.   diclofenac Sodium 1 % Gel Commonly known as: VOLTAREN APPLY 4 GRAMS TOPICALLY 4 (FOUR) TIMES DAILY.   EPINEPHrine 0.3 mg/0.3 mL Soaj injection Commonly known as: EPI-PEN Inject 0.3 mg into the muscle as needed for anaphylaxis.   fluticasone 50 MCG/ACT nasal spray  Commonly known as: FLONASE Place 1 spray into both nostrils daily.   levocetirizine 5 MG tablet Commonly known as: XYZAL Take 1 tablet (5 mg total) by mouth every evening.   metoprolol tartrate 25 MG tablet Commonly known as: LOPRESSOR Take 0.5 tablets (12.5 mg total) by mouth 2 (two) times daily.   montelukast 10 MG tablet Commonly known as: SINGULAIR Take 1 tablet (10 mg total) by mouth at bedtime. Started by: Alfonse Spruce, MD   nitroGLYCERIN 0.4 MG SL tablet Commonly known as: NITROSTAT Place 1 tablet (0.4 mg total) under the tongue every 5 (five) minutes as needed for chest pain.   omeprazole 20 MG capsule Commonly known as: PRILOSEC Take 1 capsule (20 mg total) by mouth every evening.   predniSONE 10 MG tablet Commonly known as:  DELTASONE Take 2 tablets (20 mg total) by mouth daily.   traZODone 50 MG tablet Commonly known as: DESYREL Take 1 tablet (50 mg total) by mouth at bedtime as needed for sleep.       Birth History: non-contributory  Developmental History: non-contributory  Past Surgical History: Past Surgical History:  Procedure Laterality Date  . CATARACT EXTRACTION, BILATERAL    . COLONOSCOPY     >10 yrs ago-polyps per pt- we have no report   . EXTRACORPOREAL SHOCK WAVE LITHOTRIPSY  x2 last one 2005  . KNEE ARTHROSCOPY Right 05/02/2015   Procedure: RIGHT KNEE ARTHROSCOPY WITH medial meniscal DEBRIDEMENT ;  Surgeon: Ollen Gross, MD;  Location: [facility];  Service: Orthopedics;  Laterality: Right;  . LEFT HEART CATHETERIZATION WITH CORONARY ANGIOGRAM N/A 08/05/2013   Procedure: LEFT HEART CATHETERIZATION WITH CORONARY ANGIOGRAM;  Surgeon: Micheline Chapman, MD;  Location: [facility] CATH LAB;  Service: Cardiovascular;  Laterality: N/A;  Promus DES's to  mLCFx,  OM1,  mLAD (total 3)/   mRCA 30%,  dLM  20-30%,  preserved LVSF, ef 55-65%  . LUMBAR SPINE SURGERY  x3  last one 1980's  . SHOULDER ARTHROSCOPY Right 2013  . TONSILLECTOMY       Family History: Family History  Problem Relation Age of Onset  . Coronary artery disease Mother        CABG  . CAD Sister   . Bone cancer Sister   . Alzheimer's disease Father   . Aneurysm Brother        brain  . Heart failure Sister   . Bone cancer Sister   . Colon cancer Neg Hx   . Colon polyps Neg Hx   . Esophageal cancer Neg Hx   . Rectal cancer Neg Hx   . Stomach cancer Neg Hx      Social History: Mukhtar lives at home with his family. They live in a house that is 72 years old. There is wood in the main living areas and hardwoods in the bedroom. They have gas heating and central cooling. There are cats and dogs in the home. There are no dust mite coverings on the bedding.  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 He is a never smoker.    Review of Systems  Constitutional: Negative.  Negative for chills, fever, malaise/fatigue and weight loss.  HENT: Positive for congestion. Negative for ear discharge, ear pain and sinus pain.   Eyes: Negative for pain, discharge and redness.  Respiratory: Positive for cough. Negative for sputum production, shortness of breath and wheezing.   Cardiovascular: Negative.  Negative for chest pain and palpitations.  Gastrointestinal: Negative for abdominal pain, constipation, diarrhea, heartburn, nausea and vomiting.  Skin: Negative.  Negative for itching and rash.  Neurological: Negative for dizziness and headaches.  Endo/Heme/Allergies: Positive for environmental allergies. Does not bruise/bleed easily.       Objective:   Blood pressure (!) 142/80, pulse (!) 55, temperature 98 F (36.7 C), temperature source Temporal, resp. rate 16, height 5' 8.9" (1.75 m), weight 216 lb 9.6 oz (98.2 kg), SpO2 97 %. Body mass index is 32.08 kg/m.   Physical Exam:   Physical Exam Constitutional:      Appearance: He is well-developed.  HENT:     Head: Normocephalic and atraumatic.     Right Ear: Tympanic membrane, ear canal and external ear normal. No drainage, swelling or tenderness. Tympanic membrane is not injected, scarred, erythematous, retracted or bulging.     Left Ear: Tympanic membrane, ear canal and external ear normal. No drainage, swelling or tenderness. Tympanic membrane is not injected, scarred, erythematous, retracted or bulging.     Nose: No nasal deformity, septal deviation, mucosal edema, rhinorrhea or epistaxis.     Right Sinus: No maxillary sinus tenderness or frontal sinus tenderness.     Left Sinus: No maxillary sinus tenderness or frontal sinus tenderness.     Mouth/Throat:     Mouth: Oropharynx is clear and moist. Mucous membranes are not pale and not dry.     Pharynx: Uvula midline.  Eyes:      General:        Right eye: No discharge.        Left eye: No discharge.     Extraocular Movements: EOM normal.     Conjunctiva/sclera: Conjunctivae normal.     Right eye: Right conjunctiva is not injected. No chemosis.    Left eye: Left conjunctiva is not injected. No chemosis.    Pupils: Pupils are equal, round, and reactive to light.  Cardiovascular:     Rate and Rhythm: Normal rate and regular rhythm.     Heart sounds: Normal heart sounds.  Pulmonary:     Effort: Pulmonary effort is normal. No tachypnea, accessory muscle usage or respiratory distress.     Breath sounds: Normal breath sounds. No wheezing, rhonchi or rales.  Chest:     Chest wall: No tenderness.  Abdominal:     Tenderness: There is no abdominal tenderness. There is no guarding or rebound.  Lymphadenopathy:     Head:     Right side of head: No submandibular, tonsillar or occipital adenopathy.     Left side of head: No submandibular, tonsillar or occipital adenopathy.     Cervical: No cervical adenopathy.  Skin:    Coloration: Skin is not pale.     Findings: No abrasion, erythema, petechiae or rash. Rash is not papular, urticarial or vesicular.  Neurological:     Mental Status: He is alert.  Psychiatric:        Mood and Affect: Mood and affect normal.        Behavior: Behavior is cooperative.      Diagnostic studies:   Spirometry: results abnormal (FEV1: 2.48/82%, FVC: 2.989/72%, FEV1/FVC: 83%).    Spirometry consistent with possible restrictive disease. Albuterol four puffs via MDI treatment given in clinic with no improvement.  Allergy Studies:     Airborne Adult Perc - 01/11/20 1122    Time Antigen Placed 0930    Allergen Manufacturer Lavella Hammock    Location Back    Number  of Test 59    Panel 1 Select          Intradermal - 01/11/20 1121    Time Antigen Placed 1100    Allergen Manufacturer Greer    Location Arm    Number of Test 15    Intradermal Select    Control Negative    [place] Negative     [place] Negative    7 Grass 1+    Ragweed mix Negative    Weed mix Negative    Tree mix Negative    Mold 1 Negative    Mold 2 Negative    Mold 3 Negative    Mold 4 3+    Cat Negative    Dog Negative    Cockroach Negative    Mite mix Negative    Other Negative           Allergy testing results were read and interpreted by myself, documented by clinical staff.         Malachi Bonds, MD [facility]

## 2020-01-11 NOTE — Patient Instructions (Addendum)
1. Mild persistent asthma, uncomplicated - Lung testing looked mostly normal, but it did improve with albuterol treatment. - Since you are needing your rescue inhaler so often, we need to start you on a daily controller medication. - Let's start Symbicort 80/4.5 mcg two puffs twice daily (contains a long acting albuterol combined with an inhaled steroid). - Spacer sample and demonstration provided. - Daily controller medication(s): Symbicort 80/4.38mcg two puffs twice daily with spacer - Prior to physical activity: albuterol 2 puffs 10-15 minutes before physical activity. - Rescue medications: albuterol 4 puffs every 4-6 hours as needed - Asthma control goals:  * Full participation in all desired activities (may need albuterol before activity) * Albuterol use two time or less a week on average (not counting use with activity) * Cough interfering with sleep two time or less a month * Oral steroids no more than once a year * No hospitalizations  2. Chronic rhinitis - Testing today showed: grasses and indoor molds - Copy of test results provided.  - Avoidance measures provided. - Continue with: Xyzal (levocetirizine) 5mg  tablet once daily - Start taking: Singulair (montelukast) 10mg  daily and Flonase (fluticasone) one spray per nostril daily  - Montelukast can cause weird dreams and irritability, so watch out for that (this is rare, however) - You can use an extra dose of the antihistamine, if needed, for breakthrough symptoms.  - Consider nasal saline rinses 1-2 times daily to remove allergens from the nasal cavities as well as help with mucous clearance (this is especially helpful to do before the nasal sprays are given) - Consider allergy shots as a means of long-term control. - Allergy shots "re-train" and "reset" the immune system to ignore environmental allergens and decrease the resulting immune response to those allergens (sneezing, itchy watery eyes, runny nose, nasal congestion, etc).     - Allergy shots improve symptoms in 75-85% of patients.  - We can discuss more at the next appointment if the medications are not working for you.   3. Return in about 6 weeks (around 02/22/2020).      Please inform us of any Emergency Department visits, hospitalizations, or changes in symptoms. Call us before going to the ED for breathing or allergy symptoms since we might be able to fit you in for a sick visit. Feel free to contact us anytime with any questions, problems, or concerns.  It was a pleasure to meet you today!  Websites that have reliable patient information: 1. American Academy of Asthma, Allergy, and Immunology: www.aaaai.org 2. Food Allergy Research and Education (FARE): foodallergy.org 3. Mothers of Asthmatics: http://www.asthmacommunitynetwork.org 4. American College of Allergy, Asthma, and Immunology: www.acaai.org   COVID-19 Vaccine Information can be found at: ShippingScam.co.uk For questions related to vaccine distribution or appointments, please email [email or web address] or call [phone].     "Like" [place] on Facebook and Instagram for our latest updates!       Make sure you are registered to vote! If you have moved or changed any of your contact information, you will need to get this updated before voting!  In some cases, you MAY be able to register to vote online: [email or web address]     Reducing Pollen Exposure  The American Academy of Allergy, Asthma and Immunology suggests the following steps to reduce your exposure to pollen during allergy seasons.    1. Do not hang sheets or clothing out to dry; pollen may collect on these items. 2. Do not mow lawns or spend time around freshly cut grass;  mowing stirs up pollen. 3. Keep windows closed at night.  Keep car windows closed while driving. 4. Minimize morning activities outdoors, a time when pollen counts are  usually at their highest. 5. Stay indoors as much as possible when pollen counts or humidity is high and on windy days when pollen tends to remain in the air longer. 6. Use air conditioning when possible.  Many air conditioners have filters that trap the pollen spores. 7. Use a HEPA room air filter to remove pollen form the indoor air you breathe.  Control of Mold Allergen   Mold and fungi can grow on a variety of surfaces provided certain temperature and moisture conditions exist.  Outdoor molds grow on plants, decaying vegetation and soil.  The major outdoor mold, Alternaria and Cladosporium, are found in very high numbers during hot and dry conditions.  Generally, a late Summer - Fall peak is seen for common outdoor fungal spores.  Rain will temporarily lower outdoor mold spore count, but counts rise rapidly when the rainy period ends.  The most important indoor molds are Aspergillus and Penicillium.  Dark, humid and poorly ventilated basements are ideal sites for mold growth.  The next most common sites of mold growth are the bathroom and the kitchen.   Indoor (Perennial) Mold Control   Positive indoor molds via skin testing: Fusarium, Aureobasidium (Pullulara) and Rhizopus  1. Maintain humidity below 50%. 2. Clean washable surfaces with 5% bleach solution. 3. Remove sources e.g. contaminated carpets.

## 2020-01-12 ENCOUNTER — Telehealth: Payer: Self-pay | Admitting: Allergy & Immunology

## 2020-01-12 NOTE — Telephone Encounter (Signed)
Patient's wife called and states [facility] did not have Flonase or Symbicort.   Please advise.

## 2020-01-12 NOTE — Telephone Encounter (Signed)
I called [facility] and they stated the Symbicort will be ready for patient to pick up today but the Flonase is not due for a refill so insurance will not pay for it at the moment. Patient's wife has been informed and verbalized understanding

## 2020-01-24 ENCOUNTER — Encounter: Payer: Self-pay | Admitting: Gastroenterology

## 2020-01-24 ENCOUNTER — Ambulatory Visit (INDEPENDENT_AMBULATORY_CARE_PROVIDER_SITE_OTHER): Payer: Medicare HMO | Admitting: Gastroenterology

## 2020-01-24 ENCOUNTER — Other Ambulatory Visit (INDEPENDENT_AMBULATORY_CARE_PROVIDER_SITE_OTHER): Payer: Medicare HMO

## 2020-01-24 VITALS — BP 140/70 | HR 62 | Ht 69.0 in | Wt 220.0 lb

## 2020-01-24 DIAGNOSIS — R197 Diarrhea, unspecified: Secondary | ICD-10-CM

## 2020-01-24 DIAGNOSIS — R5382 Chronic fatigue, unspecified: Secondary | ICD-10-CM | POA: Diagnosis not present

## 2020-01-24 LAB — HIGH SENSITIVITY CRP: CRP, High Sensitivity: 1.18 mg/L (ref 0.000–5.000)

## 2020-01-24 LAB — SEDIMENTATION RATE: Sed Rate: 15 mm/hr (ref 0–20)

## 2020-01-24 LAB — TSH: TSH: 2.43 u[IU]/mL (ref 0.35–4.50)

## 2020-01-24 MED ORDER — SUPREP BOWEL PREP KIT 17.5-3.13-1.6 GM/177ML PO SOLN: 1.0000 | ORAL | 0 refills | Status: DC

## 2020-01-24 NOTE — Patient Instructions (Signed)
COLONOSCOPY: You have been scheduled for a colonoscopy. Please follow written instructions given to you at your visit today.   PREP: Please pick up your prep supplies at the pharmacy within the next 1-3 days.  INHALERS: If you use inhalers (even only as needed), please bring them with you on the day of your procedure.  LABS: Your provider has requested that you go to the basement level for lab work before leaving today. Press "B" on the elevator. The lab is located at the first door on the left as you exit the elevator.  HEALTHCARE LAWS AND MY CHART RESULTS: Due to recent changes in healthcare laws, you may see the results of your imaging and laboratory studies on MyChart before your provider has had a chance to review them.  We understand that in some cases there may be results that are confusing or concerning to you. Not all laboratory results come back in the same time frame and the provider may be waiting for multiple results in order to interpret others.  Please give [place] 48 hours in order for your provider to thoroughly review all the results before contacting the office for clarification of your results.   If you are age 55 or older, your body mass index should be between 23-30. Your Body mass index is 32.49 kg/m. If this is out of the aforementioned range listed, please consider follow up with your Primary Care Provider.  Thank you for trusting me with your gastrointestinal care!    Tressia Danas, MD, MPH

## 2020-01-24 NOTE — Progress Notes (Addendum)
Referring Provider: Gwenlyn Perking, FNP Primary Care Physician:  Loman Brooklyn, FNP  Reason for Consultation: Diarrhea   IMPRESSION:  Chronic diarrhea x > 1 year History of colon polyps    -Patient reported polyps on colonoscopy in [place] >10 years ago    -10 tubular adenomas removed on colonoscopy 02/10/2019    -Genetic testing negative  The differential diagnosis of chronic diarrhea without alarm features includes: irritable bowel syndrome, IBD, celiac disease, missed infection (such as giardia), food intolerance, microscopic colitis, thyroid disorder, other functional GI disease. By history, this is less likely to be obstruction.  PLAN: - Fecal calprotectin, ESR, and CRP to screen for IBD - Giardia testing - TSH - IgA tissue transglutaminase, IgA level to test for celiac disease - Avoid milk products, raw fruits, raw vegetables, high fat foods, artifical sweeteners, and carbonated beverages until symptoms resolve.  - Colonoscopy with random biopsies and evaluation of the terminal ileum - Concurrent EGD if labs suggest celiac disease - Consider trial of Xifaxan if the above evaluation is negative - Add a daily dose of Metamucil, increasing to BID after one week if needed  I consented the patient at the bedside today discussing the risks, benefits, and alternatives to endoscopic evaluation. In particular, we discussed the risks that include, but are not limited to, reaction to medication, cardiopulmonary compromise, bleeding requiring blood transfusion, aspiration resulting in pneumonia, perforation requiring surgery, and even death. We reviewed the risk of missed lesion including polyps or even cancer. The patient acknowledges these risks and asks that we proceed.  Please see the "Patient Instructions" section for addition details about the plan.  HPI: Ronald Berger is a 75 y.o. male referred for chronic diarrhea.  The history is obtained through the patient and review  of his electronic health record.  He reports > 1 year of diarrhea reporting 10 bowel movements early in the day.  They start out the consistency of applesauce and are chicken broth by the time he is finished.   Frequently associated with urgency, a sense of incomplete evacuation, and gas.  He has accidents at work.  Will carry toilet paper in his pockets throughout the day.  There is no abdominal pain, tenesmus, or blood or mucus in the stool.  He has a good appetite and his weight is stable.  He has no further bowel movements after approximately 10 a.m.  No nocturnal symptoms.  He has not tried any over-the-counter medications.  He has been unable to collect a stool sample due to the malodorous nature of the stool.  Denies a precipitating event, trauma, close contacts with similar symptoms, changes in diet, recent travel or antibiotic use.   He has not had Covid.  His diet includes limited dairy products due to intolerance.  He eats very little red meat.  No identified food triggers although he initially thought eggs may be related.  Labs from 10/26/2019 show a normal comprehensive metabolic panel except for a glucose of 100 and a normal CBC TSH was normal in 2016  Prior endoscopic history: Patient reports a colonoscopy over 10 years ago in [place].  Polyps were removed.He had a screening colonoscopy with me 02/10/2019 that revealed internal and 10 tubular adenomas.  There is no known family history of colon cancer or polyps.  He reports the diarrhea was going on at that time but that he did not reveal this to me prior to his screening procedure.  Genetic testing was negative.  Past Medical History:  Diagnosis Date  . Allergic rhinitis   . Allergy   . 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  . Bilateral carpal tunnel syndrome 12/08/2018  . 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 NSTEMI 07/2013 - DES  to mLCx, DES to prox OM1, DES to mLAD with nonobstructive RCA stenosis, EF 55-65%.  . Chronic kidney disease    kidney stones   . Dyslipidemia   . Family history of bone cancer   . GERD (gastroesophageal reflux disease)   . History of kidney stones   . History of non-ST elevation myocardial infarction (NSTEMI)    07/ 2015  S/P  DES X3  . History of palpitations   . Hyperlipidemia   . Hypertension   . Insomnia 12/08/2018   x many years  . Myocardial infarction ([place]) 2015   NSTEMI- 3 stents   . OSA (obstructive sleep apnea)    cpap intolerant  . Right knee meniscal tear   . S/P drug eluting coronary stent placement    07/ 2015  x3  to mLCFX, OM1, mLAD  . Sinus bradycardia   . Sleep apnea    no cpap     Past Surgical History:  Procedure Laterality Date  . CATARACT EXTRACTION, BILATERAL    . COLONOSCOPY     >10 yrs ago-polyps per pt- we have no report   . EXTRACORPOREAL SHOCK WAVE LITHOTRIPSY  x2 last one 2005  . KNEE ARTHROSCOPY Right 05/02/2015   Procedure: RIGHT KNEE ARTHROSCOPY WITH medial meniscal DEBRIDEMENT ;  Surgeon: Gaynelle Arabian, MD;  Location: [facility];  Service: Orthopedics;  Laterality: Right;  . LEFT HEART CATHETERIZATION WITH CORONARY ANGIOGRAM N/A 08/05/2013   Procedure: LEFT HEART CATHETERIZATION WITH CORONARY ANGIOGRAM;  Surgeon: Blane Ohara, MD;  Location: [facility] CATH LAB;  Service: Cardiovascular;  Laterality: N/A;  Promus DES's to  mLCFx,  OM1,  mLAD (total 3)/   mRCA 30%,  dLM  20-30%,  preserved LVSF, ef 55-65%  . LUMBAR SPINE SURGERY  x3  last one 1980's  . SHOULDER ARTHROSCOPY Right 2013  . TONSILLECTOMY      Current Outpatient Medications  Medication Sig Dispense Refill  . albuterol (VENTOLIN HFA) 108 (90 Base) MCG/ACT inhaler Inhale 2 puffs into the lungs every 6 (six) hours as needed for wheezing or shortness of breath. 8 g 2  . aspirin 81 MG chewable tablet Chew 1 tablet (81 mg total) by mouth daily.    Marland Kitchen atorvastatin (LIPITOR)  40 MG tablet Take 40 mg by mouth daily.    . budesonide-formoterol (SYMBICORT) 80-4.5 MCG/ACT inhaler Inhale 2 puffs into the lungs 2 (two) times daily. 1 each 5  . cetirizine (ZYRTEC ALLERGY) 10 MG tablet Take 1 tablet (10 mg total) by mouth daily. 30 tablet 0  . diclofenac Sodium (VOLTAREN) 1 % GEL APPLY 4 GRAMS TOPICALLY 4 (FOUR) TIMES DAILY. 1400 g 3  . EPINEPHrine 0.3 mg/0.3 mL IJ SOAJ injection Inject 0.3 mg into the muscle as needed for anaphylaxis. 1 each 1  . fluticasone (FLONASE) 50 MCG/ACT nasal spray Place 1 spray into both nostrils daily. 16 g 5  . Fluticasone Furoate (ARNUITY ELLIPTA) 200 MCG/ACT AEPB Inhale 1 puff into the lungs daily. 30 each 2  . ibuprofen (ADVIL) 200 MG tablet Take 200 mg by mouth every 6 (six) hours as needed.    Marland Kitchen  levocetirizine (XYZAL) 5 MG tablet Take 1 tablet (5 mg total) by mouth every evening. 90 tablet 3  . metoprolol tartrate (LOPRESSOR) 25 MG tablet Take 0.5 tablets (12.5 mg total) by mouth 2 (two) times daily. 90 tablet 3  . montelukast (SINGULAIR) 10 MG tablet Take 1 tablet (10 mg total) by mouth at bedtime. 30 tablet 5  . nitroGLYCERIN (NITROSTAT) 0.4 MG SL tablet Place 1 tablet (0.4 mg total) under the tongue every 5 (five) minutes as needed for chest pain. 25 tablet 12  . omeprazole (PRILOSEC) 20 MG capsule Take 1 capsule (20 mg total) by mouth every evening. 90 capsule 3  . SUPREP BOWEL PREP KIT 17.5-3.13-1.6 GM/177ML SOLN Take 1 kit by mouth as directed. For colonoscopy prep BIN: [id number] PCN: CN GROUP: [id number] ID: [id number] mL 0  . traZODone (DESYREL) 50 MG tablet Take 1 tablet (50 mg total) by mouth at bedtime as needed for sleep. 90 tablet 2   Current Facility-Administered Medications  Medication Dose Route Frequency Provider Last Rate Last Admin  . betamethasone acetate-betamethasone sodium phosphate (CELESTONE) injection 6 mg  6 mg Intramuscular Once Claretta Fraise, MD        Allergies as of 01/24/2020 - Review Complete 01/24/2020   Allergen Reaction Noted  . Bee venom Shortness Of Breath 01/11/2020  . Oxycodone Nausea And Vomiting 04/27/2015  . Percocet [oxycodone-acetaminophen] Nausea And Vomiting 01/11/2020    Family History  Problem Relation Age of Onset  . Coronary artery disease Mother        CABG  . CAD Sister   . Bone cancer Sister   . Alzheimer's disease Father   . Aneurysm Brother        brain  . Heart failure Sister   . Bone cancer Sister   . Colon cancer Neg Hx   . Colon polyps Neg Hx   . Esophageal cancer Neg Hx   . Rectal cancer Neg Hx   . Stomach cancer Neg Hx     Social History   Socioeconomic History  . Marital status: Married    Spouse name: Joelene Millin  . Number of children: 1  . Years of education: 72  . Highest education level: Some college, no degree  Occupational History  . Occupation: [employer]    Comment: Retired - prior Oceanographer  Tobacco Use  . Smoking status: Former Smoker    Years: 1.00    Types: Cigarettes    Quit date: 04/27/1966    Years since quitting: 53.7  . Smokeless tobacco: Never Used  Vaping Use  . Vaping Use: Never used  Substance and Sexual Activity  . Alcohol use: Yes    Comment: occasional  . Drug use: No  . Sexual activity: Yes  Other Topics Concern  . Not on file  Social History Narrative  . Not on file   Social Determinants of Health   Financial Resource Strain: Not on file  Food Insecurity: Not on file  Transportation Needs: Not on file  Physical Activity: Not on file  Stress: Not on file  Social Connections: Not on file  Intimate Partner Violence: Not on file    Review of Systems: 12 system ROS is negative except as noted above.   Physical Exam: General:   Alert,  well-nourished, pleasant and cooperative in NAD Head:  Normocephalic and atraumatic. Eyes:  Sclera clear, no icterus.   Conjunctiva pink. Ears:  Normal auditory acuity. Nose:  No deformity, discharge,  or lesions. Mouth:  No deformity or lesions.    Neck:  Supple; no masses or thyromegaly. Lungs:  Clear throughout to auscultation.   No wheezes. Heart:  Regular rate and rhythm; no murmurs. Abdomen:  Soft,nontender, nondistended, normal bowel sounds, no rebound or guarding. No hepatosplenomegaly.   Rectal:  Deferred  Msk:  Symmetrical. No boney deformities LAD: No inguinal or umbilical LAD Extremities:  No clubbing or edema. Neurologic:  Alert and  oriented x4;  grossly nonfocal Skin:  Intact without significant lesions or rashes. Psych:  Alert and cooperative. Normal mood and affect.     Kyen Taite L. Tarri Glenn, MD, MPH 01/24/2020, 9:44 AM

## 2020-01-25 LAB — TISSUE TRANSGLUTAMINASE, IGA: (tTG) Ab, IgA: <1 U/mL

## 2020-01-25 NOTE — Addendum Note (Signed)
Addended by: Deon Pilling J on: 01/25/2020 04:35 PM   Modules accepted: Orders

## 2020-01-25 NOTE — Progress Notes (Signed)
IgA order added per Dr. Daleen Bo request. Lab notified via fax about need for ADD ON testing. Confirmation received. Lab called to inform that they do not have the reagent to process test as this has been on back order. They will be requesting the tube from an alternate lab for them to obtain specimen. Pt chart reviewed and appears he needs to drop off stool specimen for Giardia and fecal calprotectin. Called pt and informed about add on test. Reminded that he ensure that the lab obtains another specimen for the add on test AT THE TIME he drops off his stool specimen. Verbalized acceptance and understanding.

## 2020-01-27 ENCOUNTER — Other Ambulatory Visit: Payer: Medicare HMO

## 2020-01-27 ENCOUNTER — Encounter: Payer: Medicare HMO | Admitting: Gastroenterology

## 2020-01-27 DIAGNOSIS — R197 Diarrhea, unspecified: Secondary | ICD-10-CM

## 2020-01-27 NOTE — Addendum Note (Signed)
Addended by: Trenda Moots on: 02/22/9796 09:26 AM   Modules accepted: Orders

## 2020-01-31 LAB — GIARDIA AND CRYPTOSPORIDIUM ANTIGEN PANEL
MICRO NUMBER:: 11394351
RESULT:: NOT DETECTED
SPECIMEN QUALITY:: ADEQUATE
Specimen Quality:: ADEQUATE
micro Number:: 11394350

## 2020-01-31 LAB — IGA: Immunoglobulin A: 121 mg/dL (ref 70–320)

## 2020-01-31 LAB — CALPROTECTIN: Calprotectin: 162 mcg/g — ABNORMAL HIGH

## 2020-02-01 NOTE — Progress Notes (Signed)
Patient has been reminded to have labs done.

## 2020-02-08 ENCOUNTER — Telehealth: Payer: Medicare HMO | Admitting: Family

## 2020-02-08 DIAGNOSIS — U071 COVID-19: Secondary | ICD-10-CM

## 2020-02-08 MED ORDER — DEXAMETHASONE 6 MG PO TABS: 6.0000 mg | ORAL_TABLET | Freq: Two times a day (BID) | ORAL | 0 refills | Status: DC

## 2020-02-08 MED ORDER — FLUTICASONE PROPIONATE 50 MCG/ACT NA SUSP: 2.0000 | Freq: Every day | NASAL | 6 refills | Status: DC

## 2020-02-08 MED ORDER — BENZONATATE 100 MG PO CAPS: 100.0000 mg | ORAL_CAPSULE | Freq: Three times a day (TID) | ORAL | 0 refills | Status: DC | PRN

## 2020-02-08 NOTE — Progress Notes (Signed)
E-Visit for Corona Virus Screening  We are sorry you are not feeling well. We are here to help!  You have tested positive for COVID-19, meaning that you were infected with the novel coronavirus and could give the virus to others.  It is vitally important that you stay home so you do not spread it to others.      Please continue isolation at home, for at least 10 days since the start of your symptoms and until you have had 24 hours with no fever (without taking a fever reducer) and with improving of symptoms.  If you have no symptoms but tested positive (or all symptoms resolve after 5 days and you have no fever) you can leave your house but continue to wear a mask around others for an additional 5 days. If you have a fever,continue to stay home until you have had 24 hours of no fever. Most cases improve 5-10 days from onset but we have seen a small number of patients who have gotten worse after the 10 days.  Please be sure to watch for worsening symptoms and remain taking the proper precautions.   Go to the nearest hospital ED for assessment if fever/cough/breathlessness are severe or illness seems like a threat to life.    The following symptoms may appear 2-14 days after exposure: . Fever . Cough . Shortness of breath or difficulty breathing . Chills . Repeated shaking with chills . Muscle pain . Headache . Sore throat . New loss of taste or smell . Fatigue . Congestion or runny nose . Nausea or vomiting . Diarrhea  You have been enrolled in [facility] for COVID-19. Daily you will receive a questionnaire within the [facility] website. Our COVID-19 response team will be monitoring your responses daily.  You can use medication such as A prescription cough medication called Tessalon Perles 100 mg. You may take 1-2 capsules every 8 hours as needed for cough and A prescription for Fluticasone nasal spray 2 sprays in each nostril one time per day And dexamethasone 6 mg twice a day  for 7 days.    You may also take acetaminophen (Tylenol) as needed for fever.  HOME CARE: . Only take medications as instructed by your medical team. . Drink plenty of fluids and get plenty of rest. . A steam or ultrasonic humidifier can help if you have congestion.   GET HELP RIGHT AWAY IF YOU HAVE EMERGENCY WARNING SIGNS.  Call 911 or proceed to your closest emergency facility if: . You develop worsening high fever. . Trouble breathing . Bluish lips or face . Persistent pain or pressure in the chest . New confusion . Inability to wake or stay awake . You cough up blood. . Your symptoms become more severe . Inability to hold down food or fluids  This list is not all possible symptoms. Contact your medical provider for any symptoms that are severe or concerning to you.    Your e-visit answers were reviewed by a board certified advanced clinical practitioner to complete your personal care plan.  Depending on the condition, your plan could have included both over the counter or prescription medications.  If there is a problem please reply once you have received a response from your provider.  Your safety is important to [place].  If you have drug allergies check your prescription carefully.    You can use MyChart to ask questions about today's visit, request a non-urgent call back, or ask for a work  or school excuse for 24 hours related to this e-Visit. If it has been greater than 24 hours you will need to follow up with your provider, or enter a new e-Visit to address those concerns. You will get an e-mail in the next two days asking about your experience.  I hope that your e-visit has been valuable and will speed your recovery. Thank you for using e-visits.    Approximately 5 minutes was spent documenting and reviewing patient's chart.

## 2020-02-09 ENCOUNTER — Other Ambulatory Visit: Payer: Medicare HMO | Admitting: Gastroenterology

## 2020-02-09 ENCOUNTER — Telehealth: Payer: Self-pay | Admitting: Gastroenterology

## 2020-02-09 NOTE — Telephone Encounter (Signed)
Please reschedule when his Covid has resolved. Thanks.

## 2020-02-11 ENCOUNTER — Other Ambulatory Visit: Payer: Self-pay | Admitting: Family Medicine

## 2020-02-11 DIAGNOSIS — U071 COVID-19: Secondary | ICD-10-CM

## 2020-02-20 ENCOUNTER — Other Ambulatory Visit: Payer: Self-pay

## 2020-02-20 ENCOUNTER — Ambulatory Visit
Admission: RE | Admit: 2020-02-20 | Discharge: 2020-02-20 | Disposition: A | Discharge status: Home / Self Care | Payer: Medicare HMO | Source: Ambulatory Visit | Attending: Family Medicine | Admitting: Family Medicine

## 2020-02-20 ENCOUNTER — Inpatient Hospital Stay (HOSPITAL_COMMUNITY)
Admission: EM | Admit: 2020-02-20 | Discharge: 2020-02-23 | DRG: 871 | Disposition: A | Discharge status: Home / Self Care | Payer: Medicare HMO | Attending: Internal Medicine | Admitting: Internal Medicine

## 2020-02-20 ENCOUNTER — Emergency Department (HOSPITAL_COMMUNITY): Payer: Medicare HMO

## 2020-02-20 ENCOUNTER — Ambulatory Visit (INDEPENDENT_AMBULATORY_CARE_PROVIDER_SITE_OTHER): Payer: Medicare HMO

## 2020-02-20 ENCOUNTER — Encounter (HOSPITAL_COMMUNITY): Payer: Self-pay | Admitting: *Deleted

## 2020-02-20 VITALS — BP 147/77 | HR 71 | Temp 97.4°F | Resp 22

## 2020-02-20 DIAGNOSIS — E871 Hypo-osmolality and hyponatremia: Secondary | ICD-10-CM | POA: Diagnosis present

## 2020-02-20 DIAGNOSIS — R5383 Other fatigue: Secondary | ICD-10-CM | POA: Diagnosis not present

## 2020-02-20 DIAGNOSIS — Z8249 Family history of ischemic heart disease and other diseases of the circulatory system: Secondary | ICD-10-CM

## 2020-02-20 DIAGNOSIS — Z87442 Personal history of urinary calculi: Secondary | ICD-10-CM

## 2020-02-20 DIAGNOSIS — J1282 Pneumonia due to coronavirus disease 2019: Secondary | ICD-10-CM | POA: Diagnosis present

## 2020-02-20 DIAGNOSIS — E8809 Other disorders of plasma-protein metabolism, not elsewhere classified: Secondary | ICD-10-CM | POA: Diagnosis present

## 2020-02-20 DIAGNOSIS — I10 Essential (primary) hypertension: Secondary | ICD-10-CM | POA: Diagnosis present

## 2020-02-20 DIAGNOSIS — Z79899 Other long term (current) drug therapy: Secondary | ICD-10-CM

## 2020-02-20 DIAGNOSIS — U071 COVID-19: Secondary | ICD-10-CM

## 2020-02-20 DIAGNOSIS — R531 Weakness: Secondary | ICD-10-CM

## 2020-02-20 DIAGNOSIS — I2510 Atherosclerotic heart disease of native coronary artery without angina pectoris: Secondary | ICD-10-CM | POA: Diagnosis present

## 2020-02-20 DIAGNOSIS — J9601 Acute respiratory failure with hypoxia: Secondary | ICD-10-CM | POA: Diagnosis present

## 2020-02-20 DIAGNOSIS — K219 Gastro-esophageal reflux disease without esophagitis: Secondary | ICD-10-CM | POA: Diagnosis present

## 2020-02-20 DIAGNOSIS — Z808 Family history of malignant neoplasm of other organs or systems: Secondary | ICD-10-CM

## 2020-02-20 DIAGNOSIS — Z7951 Long term (current) use of inhaled steroids: Secondary | ICD-10-CM

## 2020-02-20 DIAGNOSIS — J45909 Unspecified asthma, uncomplicated: Secondary | ICD-10-CM | POA: Diagnosis present

## 2020-02-20 DIAGNOSIS — A4189 Other specified sepsis: Principal | ICD-10-CM | POA: Diagnosis present

## 2020-02-20 DIAGNOSIS — R079 Chest pain, unspecified: Secondary | ICD-10-CM | POA: Diagnosis not present

## 2020-02-20 DIAGNOSIS — R519 Headache, unspecified: Secondary | ICD-10-CM

## 2020-02-20 DIAGNOSIS — R0602 Shortness of breath: Secondary | ICD-10-CM

## 2020-02-20 DIAGNOSIS — I252 Old myocardial infarction: Secondary | ICD-10-CM

## 2020-02-20 DIAGNOSIS — E785 Hyperlipidemia, unspecified: Secondary | ICD-10-CM | POA: Diagnosis present

## 2020-02-20 DIAGNOSIS — J189 Pneumonia, unspecified organism: Secondary | ICD-10-CM | POA: Diagnosis present

## 2020-02-20 DIAGNOSIS — Z820 Family history of epilepsy and other diseases of the nervous system: Secondary | ICD-10-CM

## 2020-02-20 DIAGNOSIS — Z7982 Long term (current) use of aspirin: Secondary | ICD-10-CM

## 2020-02-20 DIAGNOSIS — Z955 Presence of coronary angioplasty implant and graft: Secondary | ICD-10-CM

## 2020-02-20 DIAGNOSIS — J159 Unspecified bacterial pneumonia: Secondary | ICD-10-CM | POA: Diagnosis present

## 2020-02-20 LAB — COMPREHENSIVE METABOLIC PANEL
ALT: 35 U/L (ref 0–44)
AST: 28 U/L (ref 15–41)
Albumin: 2.7 g/dL — ABNORMAL LOW (ref 3.5–5.0)
Alkaline Phosphatase: 71 U/L (ref 38–126)
Anion gap: 9 (ref 5–15)
BUN: 20 mg/dL (ref 8–23)
CO2: 20 mmol/L — ABNORMAL LOW (ref 22–32)
Calcium: 8.1 mg/dL — ABNORMAL LOW (ref 8.9–10.3)
Chloride: 104 mmol/L (ref 98–111)
Creatinine, Ser: 1.16 mg/dL (ref 0.61–1.24)
GFR, Estimated: >60 mL/min (ref >60)
Glucose, Bld: 155 mg/dL — ABNORMAL HIGH (ref 70–99)
Potassium: 4.6 mmol/L (ref 3.5–5.1)
Sodium: 133 mmol/L — ABNORMAL LOW (ref 135–145)
Total Bilirubin: 1.3 mg/dL — ABNORMAL HIGH (ref 0.3–1.2)
Total Protein: 6.1 g/dL — ABNORMAL LOW (ref 6.5–8.1)

## 2020-02-20 LAB — CBC WITH DIFFERENTIAL/PLATELET
Abs Immature Granulocytes: 0.12 10*3/uL — ABNORMAL HIGH (ref 0.00–0.07)
Basophils Absolute: 0 10*3/uL (ref 0.0–0.1)
Basophils Relative: 0 %
Eosinophils Absolute: 0.1 10*3/uL (ref 0.0–0.5)
Eosinophils Relative: 1 %
HCT: 44.1 % (ref 39.0–52.0)
Hemoglobin: 14.9 g/dL (ref 13.0–17.0)
Immature Granulocytes: 1 %
Lymphocytes Relative: 5 %
Lymphs Abs: 1.1 10*3/uL (ref 0.7–4.0)
MCH: 29.5 pg (ref 26.0–34.0)
MCHC: 33.8 g/dL (ref 30.0–36.0)
MCV: 87.3 fL (ref 80.0–100.0)
Monocytes Absolute: 0.9 10*3/uL (ref 0.1–1.0)
Monocytes Relative: 5 %
Neutro Abs: 17.4 10*3/uL — ABNORMAL HIGH (ref 1.7–7.7)
Neutrophils Relative %: 88 %
Platelets: 276 10*3/uL (ref 150–400)
RBC: 5.05 MIL/uL (ref 4.22–5.81)
RDW: 12.7 % (ref 11.5–15.5)
WBC: 19.7 10*3/uL — ABNORMAL HIGH (ref 4.0–10.5)
nRBC: 0 % (ref 0.0–0.2)

## 2020-02-20 LAB — URINALYSIS, ROUTINE W REFLEX MICROSCOPIC
Bilirubin Urine: NEGATIVE
Glucose, UA: NEGATIVE mg/dL
Hgb urine dipstick: NEGATIVE
Ketones, ur: NEGATIVE mg/dL
Leukocytes,Ua: NEGATIVE
Nitrite: NEGATIVE
Protein, ur: NEGATIVE mg/dL
Specific Gravity, Urine: 1.023 (ref 1.005–1.030)
pH: 5 (ref 5.0–8.0)

## 2020-02-20 LAB — LACTIC ACID, PLASMA: Lactic Acid, Venous: 1.5 mmol/L (ref 0.5–1.9)

## 2020-02-20 MED ORDER — PROMETHAZINE-DM 6.25-15 MG/5ML PO SYRP: 5.0000 mL | ORAL_SOLUTION | Freq: Four times a day (QID) | ORAL | 0 refills | Status: DC | PRN

## 2020-02-20 MED ORDER — AZITHROMYCIN 250 MG PO TABS: 250.0000 mg | ORAL_TABLET | Freq: Every day | ORAL | 0 refills | Status: DC

## 2020-02-20 MED ORDER — PREDNISONE 10 MG PO TABS: 20.0000 mg | ORAL_TABLET | Freq: Every day | ORAL | 0 refills | Status: DC

## 2020-02-20 NOTE — ED Provider Notes (Signed)
[place]   [id number] 02/20/20 Arrival Time: 7829   CC: COVID symptoms  SUBJECTIVE: History from: patient.  DARON STUTZ is a 75 y.o. male who presents with cough, congestion, shortness of breath.  Had a positive Covid test on 02/06/2020. Denies sick exposure to COVID, flu or strep. Denies recent travel. Has completed Covid vaccines.  Has not had booster vaccine for Covid.  Has not had flu vaccine this year.  Has been taking OTC cough and cold with no relief.  Cough, fatigue, shortness of breath are worsened with activity. Denies previous symptoms in the past. Denies sinus pain, rhinorrhea, sore throat, nausea, changes in bowel or bladder habits.    ROS: As per HPI.  All other pertinent ROS negative.     Past Medical History:  Diagnosis Date  . Allergic rhinitis   . Allergy   . 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  . Bilateral carpal tunnel syndrome 12/08/2018  . 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 NSTEMI 07/2013 - DES to mLCx, DES to prox OM1, DES to mLAD with nonobstructive RCA stenosis, EF 55-65%.  . Chronic kidney disease    kidney stones   . Dyslipidemia   . Family history of bone cancer   . GERD (gastroesophageal reflux disease)   . History of kidney stones   . History of non-ST elevation myocardial infarction (NSTEMI)    07/ 2015  S/P  DES X3  . History of palpitations   . Hyperlipidemia   . Hypertension   . Insomnia 12/08/2018   x many years  . Myocardial infarction ([place]) 2015   NSTEMI- 3 stents   . OSA (obstructive sleep apnea)    cpap intolerant  . Right knee meniscal tear   . S/P drug eluting coronary stent placement    07/ 2015  x3  to mLCFX, OM1, mLAD  . Sinus bradycardia   . Sleep apnea    no cpap    Past Surgical History:  Procedure Laterality Date  . CATARACT EXTRACTION, BILATERAL    . COLONOSCOPY     >10 yrs ago-polyps per pt- we have  no report   . EXTRACORPOREAL SHOCK WAVE LITHOTRIPSY  x2 last one 2005  . KNEE ARTHROSCOPY Right 05/02/2015   Procedure: RIGHT KNEE ARTHROSCOPY WITH medial meniscal DEBRIDEMENT ;  Surgeon: Gaynelle Arabian, MD;  Location: [facility];  Service: Orthopedics;  Laterality: Right;  . LEFT HEART CATHETERIZATION WITH CORONARY ANGIOGRAM N/A 08/05/2013   Procedure: LEFT HEART CATHETERIZATION WITH CORONARY ANGIOGRAM;  Surgeon: Blane Ohara, MD;  Location: [facility] CATH LAB;  Service: Cardiovascular;  Laterality: N/A;  Promus DES's to  mLCFx,  OM1,  mLAD (total 3)/   mRCA 30%,  dLM  20-30%,  preserved LVSF, ef 55-65%  . LUMBAR SPINE SURGERY  x3  last one 1980's  . SHOULDER ARTHROSCOPY Right 2013  . TONSILLECTOMY     Allergies  Allergen Reactions  . Bee Venom Shortness Of Breath  . Oxycodone Nausea And Vomiting  . Percocet [Oxycodone-Acetaminophen] Nausea And Vomiting   No current facility-administered medications on file prior to encounter.   Current Outpatient Medications on File Prior to Encounter  Medication Sig Dispense Refill  . albuterol (VENTOLIN HFA) 108 (90 Base) MCG/ACT inhaler Inhale 2 puffs into the lungs every 6 (six) hours as needed for wheezing or shortness of breath. 8 g 2  .  ARNUITY ELLIPTA 200 MCG/ACT AEPB INHALE 1 PUFF INTO THE LUNGS DAILY. 90 each 0  . aspirin 81 MG chewable tablet Chew 1 tablet (81 mg total) by mouth daily.    Marland Kitchen atorvastatin (LIPITOR) 40 MG tablet Take 40 mg by mouth daily.    . benzonatate (TESSALON PERLES) 100 MG capsule Take 1 capsule (100 mg total) by mouth 3 (three) times daily as needed. 20 capsule 0  . budesonide-formoterol (SYMBICORT) 80-4.5 MCG/ACT inhaler Inhale 2 puffs into the lungs 2 (two) times daily. 1 each 5  . cetirizine (ZYRTEC ALLERGY) 10 MG tablet Take 1 tablet (10 mg total) by mouth daily. 30 tablet 0  . diclofenac Sodium (VOLTAREN) 1 % GEL APPLY 4 GRAMS TOPICALLY 4 (FOUR) TIMES DAILY. 1400 g 3  . EPINEPHrine 0.3 mg/0.3 mL IJ SOAJ  injection Inject 0.3 mg into the muscle as needed for anaphylaxis. 1 each 1  . fluticasone (FLONASE) 50 MCG/ACT nasal spray USE 2 SPRAYS IN EACH NOSTRIL EVERY DAY 48 g 1  . ibuprofen (ADVIL) 200 MG tablet Take 200 mg by mouth every 6 (six) hours as needed.    Marland Kitchen levocetirizine (XYZAL) 5 MG tablet Take 1 tablet (5 mg total) by mouth every evening. 90 tablet 3  . metoprolol tartrate (LOPRESSOR) 25 MG tablet Take 0.5 tablets (12.5 mg total) by mouth 2 (two) times daily. 90 tablet 3  . montelukast (SINGULAIR) 10 MG tablet Take 1 tablet (10 mg total) by mouth at bedtime. 30 tablet 5  . nitroGLYCERIN (NITROSTAT) 0.4 MG SL tablet Place 1 tablet (0.4 mg total) under the tongue every 5 (five) minutes as needed for chest pain. 25 tablet 12  . omeprazole (PRILOSEC) 20 MG capsule Take 1 capsule (20 mg total) by mouth every evening. 90 capsule 3  . SUPREP BOWEL PREP KIT 17.5-3.13-1.6 GM/177ML SOLN Take 1 kit by mouth as directed. For colonoscopy prep BIN: [id number] PCN: CN GROUP: [id number] ID: [id number] 354 mL 0  . traZODone (DESYREL) 50 MG tablet Take 1 tablet (50 mg total) by mouth at bedtime as needed for sleep. 90 tablet 2   Social History   Socioeconomic History  . Marital status: Married    Spouse name: Joelene Millin  . Number of children: 1  . Years of education: 39  . Highest education level: Some college, no degree  Occupational History  . Occupation: [employer]    Comment: Retired - prior Oceanographer  Tobacco Use  . Smoking status: Former Smoker    Years: 1.00    Types: Cigarettes    Quit date: 04/27/1966    Years since quitting: 53.8  . Smokeless tobacco: Never Used  Vaping Use  . Vaping Use: Never used  Substance and Sexual Activity  . Alcohol use: Yes    Comment: occasional  . Drug use: No  . Sexual activity: Yes  Other Topics Concern  . Not on file  Social History Narrative  . Not on file   Social Determinants of Health   Financial Resource Strain: Not on file  Food  Insecurity: Not on file  Transportation Needs: Not on file  Physical Activity: Not on file  Stress: Not on file  Social Connections: Not on file  Intimate Partner Violence: Not on file   Family History  Problem Relation Age of Onset  . Coronary artery disease Mother        CABG  . CAD Sister   . Bone cancer Sister   . Alzheimer's disease Father   .  Aneurysm Brother        brain  . Heart failure Sister   . Bone cancer Sister   . Colon cancer Neg Hx   . Colon polyps Neg Hx   . Esophageal cancer Neg Hx   . Rectal cancer Neg Hx   . Stomach cancer Neg Hx     OBJECTIVE:  Vitals:   02/20/20 1120  BP: (!) 147/77  Pulse: 71  Resp: (!) 22  Temp: (!) 97.4 F (36.3 C)  TempSrc: Oral  SpO2: 94%     General appearance: alert; appears fatigued, but nontoxic; speaking in full sentences and tolerating own secretions HEENT: NCAT; Ears: EACs clear, TMs pearly gray; Eyes: PERRL.  EOM grossly intact. Sinuses: nontender; Nose: nares patent with clear rhinorrhea, Throat: oropharynx erythematous, cobblestoning present, tonsils non erythematous or enlarged, uvula midline  Neck: supple without LAD Lungs: unlabored respirations, symmetrical air entry; cough: moderate; no respiratory distress; coarse lung sounds throughout bilateral lung fields, diminished sounds to bilateral bases Heart: regular rate and rhythm.  Radial pulses 2+ symmetrical bilaterally Skin: warm and dry Psychological: alert and cooperative; normal mood and affect  LABS:  No results found for this or any previous visit (from the past 24 hour(s)).   ASSESSMENT & PLAN:  1. Pneumonia due to COVID-19 virus   2. SOB (shortness of breath)   3. Other fatigue   4. Nonintractable headache, unspecified chronicity pattern, unspecified headache type   5. Weakness     Meds ordered this encounter  Medications  . promethazine-dextromethorphan (PROMETHAZINE-DM) 6.25-15 MG/5ML syrup    Sig: Take 5 mLs by mouth 4 (four) times daily  as needed for cough.    Dispense:  118 mL    Refill:  0    Order Specific Question:   Supervising Provider    Answer:   Chase Picket [id number]  . predniSONE (DELTASONE) 10 MG tablet    Sig: Take 2 tablets (20 mg total) by mouth daily for 10 days.    Dispense:  20 tablet    Refill:  0    Order Specific Question:   Supervising Provider    Answer:   Chase Picket [id number]  . azithromycin (ZITHROMAX) 250 MG tablet    Sig: Take 1 tablet (250 mg total) by mouth daily. Take first 2 tablets together, then 1 every day until finished.    Dispense:  6 tablet    Refill:  0    Order Specific Question:   Supervising Provider    Answer:   Chase Picket [[id number]]    X-ray shows patchy multifocal pneumonia consistent with COVID-19 pneumonia Promethazine cough syrup prescribed Sedation precautions given Steroids prescribed  Azithromycin prescribed If your oxygen dips below 90, you need to go to the ER for further evaluation and treatment You may need supplemental oxygen this occurs Continue supportive care at home Get plenty of rest and push fluids Use OTC zyrtec for nasal congestion, runny nose, and/or sore throat Use OTC flonase for nasal congestion and runny nose Use medications daily for symptom relief Use OTC medications like ibuprofen or tylenol as needed fever or pain Call or go to the ED if you have any new or worsening symptoms such as fever, worsening cough, shortness of breath, chest tightness, chest pain, turning blue, changes in mental status.  Reviewed expectations re: course of current medical issues. Questions answered. Outlined signs and symptoms indicating need for more acute intervention. Patient verbalized understanding. After Visit Summary given.  Faustino Congress, NP 02/20/20 1615

## 2020-02-20 NOTE — ED Triage Notes (Signed)
Tested positive for covid on 17.  Still has cough, congestion and trouble breathing

## 2020-02-20 NOTE — Discharge Instructions (Addendum)
I have sent in prednisone for you to take 20 mg daily for 10 days  I have sent in azithromycin for you to take. Take 2 tablets today, then one tablet daily for the next 4 days.  I have sent in cough syrup for you to take. This medication can make you sleepy. Do not drive while taking this medication.  I have also sent you home with an incentive spirometer.  You are to use this every 4 hours while you are awake, to help keep your lungs open.  Continue checking oxygen saturation at home.  If you dipped below 90, go to the ER for further evaluation and treatment. If your oxygen saturation is below 90 for too long, our body can only compensate for so long, you will need labs and further evaluation  Follow-up as needed

## 2020-02-20 NOTE — ED Triage Notes (Addendum)
Pt reports diagnosed with covid on 1/31. Has been diagnosed with pneumonia and still has weakness, sob, headache. spo2 in the 80s today at home. spo2 92% at triage, able to speak in full sentences at triage.

## 2020-02-21 DIAGNOSIS — J9601 Acute respiratory failure with hypoxia: Secondary | ICD-10-CM | POA: Diagnosis not present

## 2020-02-21 DIAGNOSIS — J45909 Unspecified asthma, uncomplicated: Secondary | ICD-10-CM | POA: Diagnosis not present

## 2020-02-21 DIAGNOSIS — U071 COVID-19: Secondary | ICD-10-CM | POA: Diagnosis not present

## 2020-02-21 DIAGNOSIS — J1282 Pneumonia due to coronavirus disease 2019: Secondary | ICD-10-CM

## 2020-02-21 DIAGNOSIS — A419 Sepsis, unspecified organism: Secondary | ICD-10-CM

## 2020-02-21 DIAGNOSIS — R6520 Severe sepsis without septic shock: Secondary | ICD-10-CM

## 2020-02-21 DIAGNOSIS — E871 Hypo-osmolality and hyponatremia: Secondary | ICD-10-CM

## 2020-02-21 DIAGNOSIS — J189 Pneumonia, unspecified organism: Secondary | ICD-10-CM | POA: Diagnosis present

## 2020-02-21 LAB — D-DIMER, QUANTITATIVE: D-Dimer, Quant: 0.58 ug/mL-FEU — ABNORMAL HIGH (ref 0.00–0.50)

## 2020-02-21 LAB — BRAIN NATRIURETIC PEPTIDE: B Natriuretic Peptide: 16.3 pg/mL (ref 0.0–100.0)

## 2020-02-21 LAB — LACTIC ACID, PLASMA: Lactic Acid, Venous: 2.3 mmol/L (ref 0.5–1.9)

## 2020-02-21 LAB — C-REACTIVE PROTEIN: CRP: 5.8 mg/dL — ABNORMAL HIGH (ref <1.0)

## 2020-02-21 LAB — PROCALCITONIN: Procalcitonin: <0.1 ng/mL

## 2020-02-21 MED ORDER — SODIUM CHLORIDE 0.9 % IV SOLN
500.0000 mg | INTRAVENOUS | Status: DC
  Administered 2020-02-22: 500 mg via INTRAVENOUS
  Filled 2020-02-21: qty 500

## 2020-02-21 MED ORDER — FLUTICASONE PROPIONATE HFA 110 MCG/ACT IN AERO
2.0000 | INHALATION_SPRAY | Freq: Two times a day (BID) | RESPIRATORY_TRACT | Status: DC
  Administered 2020-02-21 – 2020-02-23 (×4): 2 via RESPIRATORY_TRACT
  Filled 2020-02-21 (×2): qty 12

## 2020-02-21 MED ORDER — METHYLPREDNISOLONE SODIUM SUCC 125 MG IJ SOLR
50.0000 mg | Freq: Two times a day (BID) | INTRAMUSCULAR | Status: DC
  Administered 2020-02-21 – 2020-02-23 (×5): 50 mg via INTRAVENOUS
  Filled 2020-02-21 (×5): qty 2

## 2020-02-21 MED ORDER — ASCORBIC ACID 500 MG PO TABS
500.0000 mg | ORAL_TABLET | Freq: Every day | ORAL | Status: DC
  Administered 2020-02-21 – 2020-02-23 (×3): 500 mg via ORAL
  Filled 2020-02-21 (×3): qty 1

## 2020-02-21 MED ORDER — MOMETASONE FURO-FORMOTEROL FUM 100-5 MCG/ACT IN AERO
2.0000 | INHALATION_SPRAY | Freq: Two times a day (BID) | RESPIRATORY_TRACT | Status: DC
  Administered 2020-02-21 – 2020-02-23 (×4): 2 via RESPIRATORY_TRACT
  Filled 2020-02-21 (×2): qty 8.8

## 2020-02-21 MED ORDER — LORATADINE 10 MG PO TABS
10.0000 mg | ORAL_TABLET | Freq: Every day | ORAL | Status: DC
  Administered 2020-02-21 – 2020-02-23 (×3): 10 mg via ORAL
  Filled 2020-02-21 (×3): qty 1

## 2020-02-21 MED ORDER — ONDANSETRON HCL 4 MG/2ML IJ SOLN: 4.0000 mg | Freq: Four times a day (QID) | INTRAMUSCULAR | Status: DC | PRN

## 2020-02-21 MED ORDER — FLUTICASONE PROPIONATE 50 MCG/ACT NA SUSP
2.0000 | Freq: Every day | NASAL | Status: DC
  Administered 2020-02-23: 2 via NASAL
  Filled 2020-02-21: qty 16

## 2020-02-21 MED ORDER — ALBUTEROL SULFATE (2.5 MG/3ML) 0.083% IN NEBU
5.0000 mg | INHALATION_SOLUTION | Freq: Once | RESPIRATORY_TRACT | Status: AC
  Administered 2020-02-21: 5 mg via RESPIRATORY_TRACT
  Filled 2020-02-21: qty 6

## 2020-02-21 MED ORDER — GUAIFENESIN-DM 100-10 MG/5ML PO SYRP: 10.0000 mL | ORAL_SOLUTION | ORAL | Status: DC | PRN

## 2020-02-21 MED ORDER — IPRATROPIUM-ALBUTEROL 0.5-2.5 (3) MG/3ML IN SOLN: 3.0000 mL | Freq: Four times a day (QID) | RESPIRATORY_TRACT | Status: DC | PRN

## 2020-02-21 MED ORDER — LACTATED RINGERS IV SOLN: INTRAVENOUS | Status: AC

## 2020-02-21 MED ORDER — ATORVASTATIN CALCIUM 40 MG PO TABS
40.0000 mg | ORAL_TABLET | Freq: Every day | ORAL | Status: DC
  Administered 2020-02-21 – 2020-02-22 (×2): 40 mg via ORAL
  Filled 2020-02-21 (×2): qty 1

## 2020-02-21 MED ORDER — DICLOFENAC SODIUM 1 % EX GEL
4.0000 g | Freq: Every day | CUTANEOUS | Status: DC
  Filled 2020-02-21 (×2): qty 100

## 2020-02-21 MED ORDER — SODIUM CHLORIDE 0.9% FLUSH
3.0000 mL | Freq: Two times a day (BID) | INTRAVENOUS | Status: DC
  Administered 2020-02-21 – 2020-02-23 (×5): 3 mL via INTRAVENOUS

## 2020-02-21 MED ORDER — ZINC SULFATE 220 (50 ZN) MG PO CAPS
220.0000 mg | ORAL_CAPSULE | Freq: Every day | ORAL | Status: DC
  Administered 2020-02-21 – 2020-02-23 (×3): 220 mg via ORAL
  Filled 2020-02-21 (×3): qty 1

## 2020-02-21 MED ORDER — ONDANSETRON HCL 4 MG PO TABS: 4.0000 mg | ORAL_TABLET | Freq: Four times a day (QID) | ORAL | Status: DC | PRN

## 2020-02-21 MED ORDER — MONTELUKAST SODIUM 10 MG PO TABS
10.0000 mg | ORAL_TABLET | Freq: Every day | ORAL | Status: DC
Start: 2020-02-21 — End: 2020-02-23
  Administered 2020-02-21 – 2020-02-22 (×2): 10 mg via ORAL
  Filled 2020-02-21 (×3): qty 1

## 2020-02-21 MED ORDER — SODIUM CHLORIDE 0.9 % IV SOLN
500.0000 mg | Freq: Once | INTRAVENOUS | Status: AC
  Administered 2020-02-21: 500 mg via INTRAVENOUS
  Filled 2020-02-21: qty 500

## 2020-02-21 MED ORDER — METOPROLOL TARTRATE 12.5 MG HALF TABLET
12.5000 mg | ORAL_TABLET | Freq: Two times a day (BID) | ORAL | Status: DC
  Administered 2020-02-21 – 2020-02-23 (×5): 12.5 mg via ORAL
  Filled 2020-02-21 (×5): qty 1

## 2020-02-21 MED ORDER — SODIUM CHLORIDE 0.9 % IV SOLN
1.0000 g | Freq: Once | INTRAVENOUS | Status: AC
  Administered 2020-02-21: 1 g via INTRAVENOUS
  Filled 2020-02-21: qty 10

## 2020-02-21 MED ORDER — ASPIRIN 81 MG PO CHEW
81.0000 mg | CHEWABLE_TABLET | Freq: Every day | ORAL | Status: DC
  Administered 2020-02-21 – 2020-02-23 (×3): 81 mg via ORAL
  Filled 2020-02-21 (×3): qty 1

## 2020-02-21 MED ORDER — SODIUM CHLORIDE 0.9 % IV SOLN
1.0000 g | INTRAVENOUS | Status: DC
  Administered 2020-02-22 – 2020-02-23 (×2): 1 g via INTRAVENOUS
  Filled 2020-02-21: qty 10
  Filled 2020-02-21: qty 1

## 2020-02-21 MED ORDER — IPRATROPIUM BROMIDE 0.02 % IN SOLN
0.5000 mg | Freq: Once | RESPIRATORY_TRACT | Status: AC
  Administered 2020-02-21: 0.5 mg via RESPIRATORY_TRACT
  Filled 2020-02-21: qty 2.5

## 2020-02-21 MED ORDER — IPRATROPIUM-ALBUTEROL 0.5-2.5 (3) MG/3ML IN SOLN
3.0000 mL | Freq: Once | RESPIRATORY_TRACT | Status: AC
  Administered 2020-02-21: 3 mL via RESPIRATORY_TRACT
  Filled 2020-02-21: qty 3

## 2020-02-21 MED ORDER — IPRATROPIUM-ALBUTEROL 20-100 MCG/ACT IN AERS
1.0000 | INHALATION_SPRAY | Freq: Four times a day (QID) | RESPIRATORY_TRACT | Status: DC
  Filled 2020-02-21 (×2): qty 4

## 2020-02-21 MED ORDER — MELATONIN 5 MG PO TABS
10.0000 mg | ORAL_TABLET | Freq: Every day | ORAL | Status: DC
  Administered 2020-02-21 – 2020-02-22 (×2): 10 mg via ORAL
  Filled 2020-02-21 (×3): qty 2

## 2020-02-21 MED ORDER — PANTOPRAZOLE SODIUM 40 MG PO TBEC
40.0000 mg | DELAYED_RELEASE_TABLET | Freq: Every day | ORAL | Status: DC
  Administered 2020-02-21 – 2020-02-23 (×3): 40 mg via ORAL
  Filled 2020-02-21 (×3): qty 1

## 2020-02-21 MED ORDER — PREDNISONE 20 MG PO TABS: 50.0000 mg | ORAL_TABLET | Freq: Every day | ORAL | Status: DC

## 2020-02-21 MED ORDER — ENOXAPARIN SODIUM 40 MG/0.4ML ~~LOC~~ SOLN
40.0000 mg | Freq: Every day | SUBCUTANEOUS | Status: DC
  Administered 2020-02-21 – 2020-02-23 (×3): 40 mg via SUBCUTANEOUS
  Filled 2020-02-21 (×3): qty 0.4

## 2020-02-21 MED ORDER — TRAZODONE HCL 50 MG PO TABS
50.0000 mg | ORAL_TABLET | Freq: Every day | ORAL | Status: DC
Start: 2020-02-21 — End: 2020-02-23
  Administered 2020-02-21 – 2020-02-22 (×2): 50 mg via ORAL
  Filled 2020-02-21 (×2): qty 1

## 2020-02-21 NOTE — H&P (Signed)
History and Physical    Ronald Berger ZHG:[id number] DOB: Feb 23, 1945 DOA: 02/20/2020  Referring MD/NP/PA: Christia Reading Opyd,MD PCP: Loman Brooklyn, FNP  Patient coming from: Home  Chief Complaint: Cough and shortness of breath  I have personally briefly reviewed patient's old medical records in [place]   HPI: Ronald Berger is a 75 y.o. male with medical history significant of hypertension, hyperlipidemia, asthma, CAD s/p PCI, and nephrolithiasis presents with complaints of cough and shortness of breath.  He had received the initial Covid vaccines, but did not get it booster.  Patient reports that he initially tested positive for COVID-19 on 1/17  and this is documented in ED visit from 1/19.  Reported that he had cough, subjective fevers decreased appetite, muscle aches, and fatigue.  He has been using over-the-counter cough medicine at home.  He had not had any kind of infusion in the outpatient setting and had seem to be getting better from when symptoms initially started until about 2 to 3 days ago.  Complaining of worsening productive cough and shortness of breath.  His appetite had returned to normal.  He always intermittently wheezes due to history of asthma.  Planes of some chest soreness secondary to coughing. Patient was seen at urgent care yesterday where he was seen to have a bilateral pneumonia.  He was prescribed a Z-Pak and prednisone.  Patient had been instructed to return to the emergency department if O2 saturations dropped below 90%.  At home patient reports that O2 saturations dropped down to 88% prompting him to come to the hospital.   ED Course: Upon admission into the emergency department patient was noted to have a temperature of 100.2 F, respirations 10-24, blood pressures maintained, and O2 saturations intermittently as low as 88%.  Labs significant for WBC 19.7, sodium 133, CO2 20, BUN 20, creatinine 1.16, total bilirubin 1.3, and lactic acid 1.5.  Chest x-ray  showed unchanged bilateral Covid pneumonia.  Patient had started Rocephin and azithromycin and given a DuoNeb breathing treatment.  TRH called to admit.  Review of Systems  Constitutional: Positive for fever and malaise/fatigue.  HENT: Negative for nosebleeds and sore throat.   Eyes: Negative for photophobia and pain.  Respiratory: Positive for cough, sputum production, shortness of breath and wheezing.   Cardiovascular: Positive for chest pain (With coughing). Negative for leg swelling.  Gastrointestinal: Negative for abdominal pain, diarrhea, nausea and vomiting.  Genitourinary: Negative for dysuria and hematuria.  Musculoskeletal: Positive for joint pain and myalgias.  Skin: Negative for itching and rash.  Neurological: Negative for focal weakness and loss of consciousness.  Psychiatric/Behavioral: Negative for memory loss.    Past Medical History:  Diagnosis Date  . Allergic rhinitis   . Allergy   . 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  . Bilateral carpal tunnel syndrome 12/08/2018  . 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 NSTEMI 07/2013 - DES to mLCx, DES to prox OM1, DES to mLAD with nonobstructive RCA stenosis, EF 55-65%.  . Chronic kidney disease    kidney stones   . Dyslipidemia   . Family history of bone cancer   . GERD (gastroesophageal reflux disease)   . History of kidney stones   . History of non-ST elevation myocardial infarction (NSTEMI)    07/ 2015  S/P  DES X3  . History of palpitations   . Hyperlipidemia   .  Hypertension   . Insomnia 12/08/2018   x many years  . Myocardial infarction ([place]) 2015   NSTEMI- 3 stents   . OSA (obstructive sleep apnea)    cpap intolerant  . Right knee meniscal tear   . S/P drug eluting coronary stent placement    07/ 2015  x3  to mLCFX, OM1, mLAD  . Sinus bradycardia   . Sleep apnea    no cpap     Past Surgical History:   Procedure Laterality Date  . CATARACT EXTRACTION, BILATERAL    . COLONOSCOPY     >10 yrs ago-polyps per pt- we have no report   . EXTRACORPOREAL SHOCK WAVE LITHOTRIPSY  x2 last one 2005  . KNEE ARTHROSCOPY Right 05/02/2015   Procedure: RIGHT KNEE ARTHROSCOPY WITH medial meniscal DEBRIDEMENT ;  Surgeon: Gaynelle Arabian, MD;  Location: [facility];  Service: Orthopedics;  Laterality: Right;  . LEFT HEART CATHETERIZATION WITH CORONARY ANGIOGRAM N/A 08/05/2013   Procedure: LEFT HEART CATHETERIZATION WITH CORONARY ANGIOGRAM;  Surgeon: Blane Ohara, MD;  Location: [facility] CATH LAB;  Service: Cardiovascular;  Laterality: N/A;  Promus DES's to  mLCFx,  OM1,  mLAD (total 3)/   mRCA 30%,  dLM  20-30%,  preserved LVSF, ef 55-65%  . LUMBAR SPINE SURGERY  x3  last one 1980's  . SHOULDER ARTHROSCOPY Right 2013  . TONSILLECTOMY       reports that he quit smoking about 53 years ago. His smoking use included cigarettes. He quit after 1.00 year of use. He has never used smokeless tobacco. He reports current alcohol use. He reports that he does not use drugs.  Allergies  Allergen Reactions  . Bee Venom Shortness Of Breath  . Oxycodone Nausea And Vomiting  . Percocet [Oxycodone-Acetaminophen] Nausea And Vomiting    Family History  Problem Relation Age of Onset  . Coronary artery disease Mother        CABG  . CAD Sister   . Bone cancer Sister   . Alzheimer's disease Father   . Aneurysm Brother        brain  . Heart failure Sister   . Bone cancer Sister   . Colon cancer Neg Hx   . Colon polyps Neg Hx   . Esophageal cancer Neg Hx   . Rectal cancer Neg Hx   . Stomach cancer Neg Hx     Prior to Admission medications   Medication Sig Start Date End Date Taking? Authorizing Provider  albuterol (VENTOLIN HFA) 108 (90 Base) MCG/ACT inhaler Inhale 2 puffs into the lungs every 6 (six) hours as needed for wheezing or shortness of breath. 11/18/19   Gwenlyn Perking, FNP  ARNUITY ELLIPTA 200  MCG/ACT AEPB INHALE 1 PUFF INTO THE LUNGS DAILY. 02/13/20   Loman Brooklyn, FNP  aspirin 81 MG chewable tablet Chew 1 tablet (81 mg total) by mouth daily. 08/06/13   Delfina Redwood, MD  atorvastatin (LIPITOR) 40 MG tablet Take 40 mg by mouth daily.    [provider]  azithromycin (ZITHROMAX) 250 MG tablet Take 1 tablet (250 mg total) by mouth daily. Take first 2 tablets together, then 1 every day until finished. 02/20/20   Faustino Congress, NP  benzonatate (TESSALON PERLES) 100 MG capsule Take 1 capsule (100 mg total) by mouth 3 (three) times daily as needed. 02/08/20   Sharion Balloon, FNP  budesonide-formoterol (SYMBICORT) 80-4.5 MCG/ACT inhaler Inhale 2 puffs into the lungs 2 (two) times daily. 01/11/20  Valentina Shaggy, MD  cetirizine (ZYRTEC ALLERGY) 10 MG tablet Take 1 tablet (10 mg total) by mouth daily. 01/03/20   Avegno, Darrelyn Hillock, FNP  diclofenac Sodium (VOLTAREN) 1 % GEL APPLY 4 GRAMS TOPICALLY 4 (FOUR) TIMES DAILY. 07/05/19   Loman Brooklyn, FNP  EPINEPHrine 0.3 mg/0.3 mL IJ SOAJ injection Inject 0.3 mg into the muscle as needed for anaphylaxis. 11/18/19   Gwenlyn Perking, FNP  fluticasone (FLONASE) 50 MCG/ACT nasal spray USE 2 SPRAYS IN [facility] NOSTRIL EVERY DAY 02/13/20   Evelina Dun A, FNP  ibuprofen (ADVIL) 200 MG tablet Take 200 mg by mouth every 6 (six) hours as needed.    [provider]  levocetirizine (XYZAL) 5 MG tablet Take 1 tablet (5 mg total) by mouth every evening. 07/20/19   Loman Brooklyn, FNP  metoprolol tartrate (LOPRESSOR) 25 MG tablet Take 0.5 tablets (12.5 mg total) by mouth 2 (two) times daily. 07/20/19   Loman Brooklyn, FNP  montelukast (SINGULAIR) 10 MG tablet Take 1 tablet (10 mg total) by mouth at bedtime. 01/11/20   Valentina Shaggy, MD  nitroGLYCERIN (NITROSTAT) 0.4 MG SL tablet Place 1 tablet (0.4 mg total) under the tongue every 5 (five) minutes as needed for chest pain. 12/22/13   Lelon Perla, MD  omeprazole  (PRILOSEC) 20 MG capsule Take 1 capsule (20 mg total) by mouth every evening. 07/20/19   Loman Brooklyn, FNP  predniSONE (DELTASONE) 10 MG tablet Take 2 tablets (20 mg total) by mouth daily for 10 days. 02/20/20 03/01/20  Faustino Congress, NP  promethazine-dextromethorphan (PROMETHAZINE-DM) 6.25-15 MG/5ML syrup Take 5 mLs by mouth 4 (four) times daily as needed for cough. 02/20/20   Faustino Congress, NP  SUPREP BOWEL PREP KIT 17.5-3.13-1.6 GM/177ML SOLN Take 1 kit by mouth as directed. For colonoscopy prep BIN: [id number] PCN: CN GROUP: [id number] ID: [id number] 01/24/20   Thornton Park, MD  traZODone (DESYREL) 50 MG tablet Take 1 tablet (50 mg total) by mouth at bedtime as needed for sleep. 07/20/19   Loman Brooklyn, FNP    Physical Exam:  Constitutional: Elderly male who appears to be in some respiratory Vitals:   02/21/20 0437 02/21/20 0445 02/21/20 0515 02/21/20 0530  BP: (!) 136/99 (!) 145/119 123/82 116/69  Pulse: 64 61 72 (!) 56  Resp: _0 Temp: 98.5 F (36.9 C)     TempSrc: Oral     SpO2: 95% 92% 93% 98%  Weight:      Height:       Eyes: PERRL, lids and conjunctivae normal ENMT: Mucous membranes are moist. Posterior pharynx clear of any exudate or lesions.   Neck: normal, supple, no masses, no thyromegaly Respiratory: Normal respiratory effort without significant wheeze or rhonchi appreciated.  Patient currently on room air with O2 sats intermittently dropping down into the 80s while talking, but quickly recovers to 92%. Cardiovascular: Regular rate and rhythm, no murmurs / rubs / gallops. No extremity edema. 2+ pedal pulses. No carotid bruits.  Abdomen: no tenderness, no masses palpated. No hepatosplenomegaly. Bowel sounds positive.  Musculoskeletal: no clubbing / cyanosis. No joint deformity upper and lower extremities. Good ROM, no contractures. Normal muscle tone.  Skin: no rashes, lesions, ulcers. No induration Neurologic: CN 2-12 grossly intact. Sensation  intact, DTR normal. Strength 5/5 in all 4.  Psychiatric: Normal judgment and insight. Alert and oriented x 3. Normal mood.     Labs on Admission: I have personally reviewed following labs and  imaging studies  CBC: Recent Labs  Lab 02/20/20 2231  WBC 19.7*  NEUTROABS 17.4*  HGB 14.9  HCT 44.1  MCV 87.3  PLT 007   Basic Metabolic Panel: Recent Labs  Lab 02/20/20 2231  NA 133*  K 4.6  CL 104  CO2 20*  GLUCOSE 155*  BUN 20  CREATININE 1.16  CALCIUM 8.1*   GFR: Estimated Creatinine Clearance: 67.4 mL/min (by C-G formula based on SCr of 1.16 mg/dL). Liver Function Tests: Recent Labs  Lab 02/20/20 2231  AST 28  ALT 35  ALKPHOS 71  BILITOT 1.3*  PROT 6.1*  ALBUMIN 2.7*   No results for input(s): LIPASE, AMYLASE in the last 168 hours. No results for input(s): AMMONIA in the last 168 hours. Coagulation Profile: No results for input(s): INR, PROTIME in the last 168 hours. Cardiac Enzymes: No results for input(s): CKTOTAL, CKMB, CKMBINDEX, TROPONINI in the last 168 hours. BNP (last 3 results) No results for input(s): PROBNP in the last 8760 hours. HbA1C: No results for input(s): HGBA1C in the last 72 hours. CBG: No results for input(s): GLUCAP in the last 168 hours. Lipid Profile: No results for input(s): CHOL, HDL, LDLCALC, TRIG, CHOLHDL, LDLDIRECT in the last 72 hours. Thyroid Function Tests: No results for input(s): TSH, T4TOTAL, FREET4, T3FREE, THYROIDAB in the last 72 hours. Anemia Panel: No results for input(s): VITAMINB12, FOLATE, FERRITIN, TIBC, IRON, RETICCTPCT in the last 72 hours. Urine analysis:    Component Value Date/Time   COLORURINE YELLOW 02/20/2020 2218   APPEARANCEUR CLEAR 02/20/2020 2218   LABSPEC 1.023 02/20/2020 2218   PHURINE 5.0 02/20/2020 [place] 02/20/2020 2218   HGBUR NEGATIVE 02/20/2020 2218   BILIRUBINUR NEGATIVE 02/20/2020 [place] 02/20/2020 2218   PROTEINUR NEGATIVE 02/20/2020 2218    NITRITE NEGATIVE 02/20/2020 2218   LEUKOCYTESUR NEGATIVE 02/20/2020 2218   Sepsis Labs: No results found for this or any previous visit (from the past 240 hour(s)).   Radiological Exams on Admission: DG Chest 2 View  Result Date: 02/20/2020 CLINICAL DATA:  COVID positive.  Chest pain. EXAM: CHEST - 2 VIEW COMPARISON:  01/03/2020 FINDINGS: Patchy nodular bilateral airspace disease is new since prior study. No pleural effusion. The cardiopericardial silhouette is within normal limits for size. The visualized bony structures of the thorax show no acute abnormality. IMPRESSION: New patchy nodular bilateral airspace disease consistent with multifocal pneumonia. Electronically Signed   By: Misty Stanley M.D.   On: 02/20/2020 11:34   DG Chest Portable 1 View  Result Date: 02/20/2020 CLINICAL DATA:  Shortness of breath.  COVID positive. EXAM: PORTABLE CHEST 1 VIEW COMPARISON:  Radiograph earlier today. FINDINGS: Multifocal peripheral predominant patchy and nodular opacities throughout both lungs typical of multifocal pneumonia. No significant change from earlier today. No pneumothorax or large pleural effusion. Stable heart size and mediastinal contours. No acute osseous abnormalities are seen. IMPRESSION: Unchanged bilateral COVID pneumonia from earlier today. Electronically Signed   By: Keith Rake M.D.   On: 02/20/2020 22:34    EKG: Independently reviewed.  Normal sinus rhythm at 91 beats per  Assessment/Plan Acute respiratory failure with hypoxia secondary to pneumonia due to COVID-19: Patient presents with worsening cough and shortness of breath.  O2 saturations intermittently as low as 88% on room air.  He tested positive for COVID-19 on 1/17.  E-visit documented in chart from 1/19 notes positive COVID-19 test.  Patient appears to be at least 15 days out from initial test.  Question possibility  of superimposed bacterial infection. -Admit to a MedSurg bed -COVID-19 order set  utilized -Continuous pulse oximetry with nasal cannula oxygen as needed to maintain O2 saturations above 92. -Check sputum culture -Follow-up pending inflammatory markers and monitor daily -Continue empiric antibiotics Rocephin and azithromycin -Appears out of the window of therapeutic effects of remdesivir -Breathing treatments -Solu-Medrol IV -Vitamin C and zinc -Antitussives as needed  Sepsis: Patient met SIRS criteria with initial tachypnea and white blood cell count elevated up to infection.  .7.  Lactic acid was reassuring at 1.5, but there was concern acute respiratory failure with hypoxia.  Source thought to be Covid versus bacterial pneumonia. -Follow-up sputum culture if able to be obtained -Recheck CBC in a.m.  Asthma, without acute exacerbation: Patient did not have any significant signs of wheezing on physical exam at this time. -Continue neuroleptic  Hyponatremia: Acute.  Sodium is mildly low at 133 on admission.  Suspect secondary to stenosis related with fever -Continue to monitor  Essential hypertension  Hyperlipidemia -Continue atorvastatin 40 mg nightly  Hypoalbuminemia: Acute.  On admission albumin low at 2.7.  Suspect this is secondary to COVID infection -Check prealbumin in a.m. to classify protein calorie malnutrition  GERD: Home medications include omeprazole 20 mg daily -Pharmacy substitution of Protonix  DVT prophylaxis: Lovenox Code Status: Full Family Communication: No family requested be updated at this time Disposition Plan: Hopefully discharge home Consults called: None Admission status: Inpatient, to require at least 2 midnight stay due to acute respiratory to be secondary to COVID  Norval Morton MD [facility]   If 7PM-7AM, please contact night-coverage   02/21/2020, 7:09 AM

## 2020-02-21 NOTE — ED Provider Notes (Signed)
[facility] EMERGENCY DEPARTMENT Provider Note   CSN: [id number] Arrival date & time: 02/20/20  2139     History Chief Complaint  Patient presents with  . Shortness of Breath    Ronald Berger is a 75 y.o. male.  The history is provided by the patient.  Shortness of Breath Severity:  Moderate Onset quality:  Gradual Duration:  3 days Timing:  Intermittent Progression:  Worsening Chronicity:  New Relieved by:  Rest Worsened by:  Activity Associated symptoms: cough and fever   Associated symptoms: no chest pain and no hemoptysis   Patient with history of CAD, chronic kidney disease, asthma presents with fever and cough.  Patient was diagnosed with COVID-19 approximately 2 weeks ago. Patient is vaccinated, had a mild course that began to improve.  However over the past 2-3 days he began having fever, cough and shortness of breath.  He reports walking will typically trigger a coughing fit.  He was seen at urgent care who prescribed him a Z-Pak, and advised him to go to the ER if he has any hypoxia.  He checked his pulse ox at home and dropped to 88% so he came to the emergency department.  Patient is a non-smoker.  Prior to his bout with Covid, he was very active and works in [employer].    Past Medical History:  Diagnosis Date  . Allergic rhinitis   . Allergy   . 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  . Bilateral carpal tunnel syndrome 12/08/2018  . 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 NSTEMI 07/2013 - DES to mLCx, DES to prox OM1, DES to mLAD with nonobstructive RCA stenosis, EF 55-65%.  . Chronic kidney disease    kidney stones   . Dyslipidemia   . Family history of bone cancer   . GERD (gastroesophageal reflux disease)   . History of kidney stones   . History of non-ST elevation myocardial infarction (NSTEMI)    07/ 2015  S/P  DES X3  . History of  palpitations   . Hyperlipidemia   . Hypertension   . Insomnia 12/08/2018   x many years  . Myocardial infarction ([place]) 2015   NSTEMI- 3 stents   . OSA (obstructive sleep apnea)    cpap intolerant  . Right knee meniscal tear   . S/P drug eluting coronary stent placement    07/ 2015  x3  to mLCFX, OM1, mLAD  . Sinus bradycardia   . Sleep apnea    no cpap     Patient Active Problem List   Diagnosis Date Noted  . Mild persistent asthma, uncomplicated 42/35/3614  . Seasonal and perennial allergic rhinitis 01/11/2020  . Genetic testing 03/11/2019  . Family history of bone cancer   . Insomnia 12/08/2018  . Bilateral carpal tunnel syndrome 12/08/2018  . Adenomatous polyps 12/08/2018  . OSA (obstructive sleep apnea) 04/30/2017  . Palpitations 08/29/2015  . CAD in native artery 08/23/2013  . Hyperlipidemia LDL goal <70 08/23/2013  . History of non-ST elevation myocardial infarction (NSTEMI) 08/05/2013  . Allergic rhinitis 01/22/2007  . Esophageal reflux 01/22/2007    Past Surgical History:  Procedure Laterality Date  . CATARACT EXTRACTION, BILATERAL    . COLONOSCOPY     >10 yrs ago-polyps per pt- we have no report   . EXTRACORPOREAL SHOCK WAVE LITHOTRIPSY  x2 last one  2005  . KNEE ARTHROSCOPY Right 05/02/2015   Procedure: RIGHT KNEE ARTHROSCOPY WITH medial meniscal DEBRIDEMENT ;  Surgeon: Gaynelle Arabian, MD;  Location: [facility];  Service: Orthopedics;  Laterality: Right;  . LEFT HEART CATHETERIZATION WITH CORONARY ANGIOGRAM N/A 08/05/2013   Procedure: LEFT HEART CATHETERIZATION WITH CORONARY ANGIOGRAM;  Surgeon: Blane Ohara, MD;  Location: [facility] CATH LAB;  Service: Cardiovascular;  Laterality: N/A;  Promus DES's to  mLCFx,  OM1,  mLAD (total 3)/   mRCA 30%,  dLM  20-30%,  preserved LVSF, ef 55-65%  . LUMBAR SPINE SURGERY  x3  last one 1980's  . SHOULDER ARTHROSCOPY Right 2013  . TONSILLECTOMY         Family History  Problem Relation Age of Onset  .  Coronary artery disease Mother        CABG  . CAD Sister   . Bone cancer Sister   . Alzheimer's disease Father   . Aneurysm Brother        brain  . Heart failure Sister   . Bone cancer Sister   . Colon cancer Neg Hx   . Colon polyps Neg Hx   . Esophageal cancer Neg Hx   . Rectal cancer Neg Hx   . Stomach cancer Neg Hx     Social History   Tobacco Use  . Smoking status: Former Smoker    Years: 1.00    Types: Cigarettes    Quit date: 04/27/1966    Years since quitting: 53.8  . Smokeless tobacco: Never Used  Vaping Use  . Vaping Use: Never used  Substance Use Topics  . Alcohol use: Yes    Comment: occasional  . Drug use: No    Home Medications Prior to Admission medications   Medication Sig Start Date End Date Taking? Authorizing Provider  albuterol (VENTOLIN HFA) 108 (90 Base) MCG/ACT inhaler Inhale 2 puffs into the lungs every 6 (six) hours as needed for wheezing or shortness of breath. 11/18/19   Gwenlyn Perking, FNP  ARNUITY ELLIPTA 200 MCG/ACT AEPB INHALE 1 PUFF INTO THE LUNGS DAILY. 02/13/20   Loman Brooklyn, FNP  aspirin 81 MG chewable tablet Chew 1 tablet (81 mg total) by mouth daily. 08/06/13   Delfina Redwood, MD  atorvastatin (LIPITOR) 40 MG tablet Take 40 mg by mouth daily.    [provider]  azithromycin (ZITHROMAX) 250 MG tablet Take 1 tablet (250 mg total) by mouth daily. Take first 2 tablets together, then 1 every day until finished. 02/20/20   Faustino Congress, NP  benzonatate (TESSALON PERLES) 100 MG capsule Take 1 capsule (100 mg total) by mouth 3 (three) times daily as needed. 02/08/20   Sharion Balloon, FNP  budesonide-formoterol (SYMBICORT) 80-4.5 MCG/ACT inhaler Inhale 2 puffs into the lungs 2 (two) times daily. 01/11/20   Valentina Shaggy, MD  cetirizine (ZYRTEC ALLERGY) 10 MG tablet Take 1 tablet (10 mg total) by mouth daily. 01/03/20   Avegno, Darrelyn Hillock, FNP  diclofenac Sodium (VOLTAREN) 1 % GEL APPLY 4 GRAMS TOPICALLY 4 (FOUR)  TIMES DAILY. 07/05/19   Loman Brooklyn, FNP  EPINEPHrine 0.3 mg/0.3 mL IJ SOAJ injection Inject 0.3 mg into the muscle as needed for anaphylaxis. 11/18/19   Gwenlyn Perking, FNP  fluticasone (FLONASE) 50 MCG/ACT nasal spray USE 2 SPRAYS IN [facility] NOSTRIL EVERY DAY 02/13/20   Evelina Dun A, FNP  ibuprofen (ADVIL) 200 MG tablet Take 200 mg by mouth every 6 (six) hours  as needed.    [provider]  levocetirizine (XYZAL) 5 MG tablet Take 1 tablet (5 mg total) by mouth every evening. 07/20/19   Loman Brooklyn, FNP  metoprolol tartrate (LOPRESSOR) 25 MG tablet Take 0.5 tablets (12.5 mg total) by mouth 2 (two) times daily. 07/20/19   Loman Brooklyn, FNP  montelukast (SINGULAIR) 10 MG tablet Take 1 tablet (10 mg total) by mouth at bedtime. 01/11/20   Valentina Shaggy, MD  nitroGLYCERIN (NITROSTAT) 0.4 MG SL tablet Place 1 tablet (0.4 mg total) under the tongue every 5 (five) minutes as needed for chest pain. 12/22/13   Lelon Perla, MD  omeprazole (PRILOSEC) 20 MG capsule Take 1 capsule (20 mg total) by mouth every evening. 07/20/19   Loman Brooklyn, FNP  predniSONE (DELTASONE) 10 MG tablet Take 2 tablets (20 mg total) by mouth daily for 10 days. 02/20/20 03/01/20  Faustino Congress, NP  promethazine-dextromethorphan (PROMETHAZINE-DM) 6.25-15 MG/5ML syrup Take 5 mLs by mouth 4 (four) times daily as needed for cough. 02/20/20   Faustino Congress, NP  SUPREP BOWEL PREP KIT 17.5-3.13-1.6 GM/177ML SOLN Take 1 kit by mouth as directed. For colonoscopy prep BIN: [id number] PCN: [id number] GROUP: [id number] ID: [id number] 01/24/20   Thornton Park, MD  traZODone (DESYREL) 50 MG tablet Take 1 tablet (50 mg total) by mouth at bedtime as needed for sleep. 07/20/19   Loman Brooklyn, FNP    Allergies    Bee venom, Oxycodone, and Percocet [oxycodone-acetaminophen]  Review of Systems   Review of Systems  Constitutional: Positive for fever.  Respiratory: Positive for cough and shortness of breath.  Negative for hemoptysis.   Cardiovascular: Negative for chest pain.  All other systems reviewed and are negative.   Physical Exam Updated Vital Signs BP 116/69   Pulse (!) 56   Temp 98.5 F (36.9 C) (Oral)   Resp 12   Ht 1.778 m ('5\' 10"' )   Wt 97.5 kg   SpO2 98%   BMI 30.85 kg/m   Physical Exam  CONSTITUTIONAL: Elderly HEAD: Normocephalic/atraumatic EYES: EOMI/PERRL ENMT: Mucous membranes moist NECK: supple no meningeal signs SPINE/BACK:entire spine nontender CV: S1/S2 noted, no murmurs/rubs/gallops noted LUNGS: Crackles bilaterally ABDOMEN: soft, nontender, no rebound or guarding, bowel sounds noted throughout abdomen GU:no cva tenderness NEURO: Pt is awake/alert/appropriate, moves all extremitiesx4.  No facial droop.   EXTREMITIES: pulses normal/equal, full ROM SKIN: warm, color normal PSYCH: no abnormalities of mood noted, alert and oriented to situation  ED Results / Procedures / Treatments   Labs (all labs ordered are listed, but only abnormal results are displayed) Labs Reviewed  COMPREHENSIVE METABOLIC PANEL - Abnormal; Notable for the following components:      Result Value   Sodium 133 (*)    CO2 20 (*)    Glucose, Bld 155 (*)    Calcium 8.1 (*)    Total Protein 6.1 (*)    Albumin 2.7 (*)    Total Bilirubin 1.3 (*)    All other components within normal limits  CBC WITH DIFFERENTIAL/PLATELET - Abnormal; Notable for the following components:   WBC 19.7 (*)    Neutro Abs 17.4 (*)    Abs Immature Granulocytes 0.12 (*)    All other components within normal limits  LACTIC ACID, PLASMA  URINALYSIS, ROUTINE W REFLEX MICROSCOPIC  LACTIC ACID, PLASMA    EKG EKG Interpretation  Date/Time:  Monday February 20 2020 22:00:37 EST Ventricular Rate:  91 PR Interval:  182 QRS Duration: 82 QT  Interval:  324 QTC Calculation: 398 R Axis:   22 Text Interpretation: Normal sinus rhythm Normal ECG No significant change since last tracing Confirmed by Ripley Fraise  [phone]) on 02/21/2020 5:56:54 AM   Radiology DG Chest 2 View  Result Date: 02/20/2020 CLINICAL DATA:  COVID positive.  Chest pain. EXAM: CHEST - 2 VIEW COMPARISON:  01/03/2020 FINDINGS: Patchy nodular bilateral airspace disease is new since prior study. No pleural effusion. The cardiopericardial silhouette is within normal limits for size. The visualized bony structures of the thorax show no acute abnormality. IMPRESSION: New patchy nodular bilateral airspace disease consistent with multifocal pneumonia. Electronically Signed   By: Misty Stanley M.D.   On: 02/20/2020 11:34   DG Chest Portable 1 View  Result Date: 02/20/2020 CLINICAL DATA:  Shortness of breath.  COVID positive. EXAM: PORTABLE CHEST 1 VIEW COMPARISON:  Radiograph earlier today. FINDINGS: Multifocal peripheral predominant patchy and nodular opacities throughout both lungs typical of multifocal pneumonia. No significant change from earlier today. No pneumothorax or large pleural effusion. Stable heart size and mediastinal contours. No acute osseous abnormalities are seen. IMPRESSION: Unchanged bilateral COVID pneumonia from earlier today. Electronically Signed   By: Keith Rake M.D.   On: 02/20/2020 22:34    Procedures Procedures   Medications Ordered in ED Medications  cefTRIAXone (ROCEPHIN) 1 g in sodium chloride 0.9 % 100 mL IVPB (1 g Intravenous New Bag/Given 02/21/20 0524)  azithromycin (ZITHROMAX) 500 mg in sodium chloride 0.9 % 250 mL IVPB (has no administration in time range)  albuterol (PROVENTIL) (2.5 MG/3ML) 0.083% nebulizer solution 5 mg (5 mg Nebulization Given 02/21/20 0524)  ipratropium (ATROVENT) nebulizer solution 0.5 mg (0.5 mg Nebulization Given 02/21/20 0524)    ED Course  I have reviewed the triage vital signs and the nursing notes.  Pertinent labs & imaging results that were available during my care of the patient were reviewed by me and considered in my medical decision making (see chart for details).     MDM Rules/Calculators/A&P                          5:57 AM Patient with recent bout of COVID-19 now presenting with cough and fever over the past 2 days.  Patient appears to have bacterial pneumonia at this time.  Patient was febrile and has had brief drop in his oxygenation.  Any ambulation triggers coughing and shortness of breath.  Patient will be admitted to the hospital to be treated for pneumonia. 7:42 AM Discussed with Dr. Myna Hidalgo for admission Final Clinical Impression(s) / ED Diagnoses Final diagnoses:  Community acquired pneumonia, unspecified laterality  Multifocal pneumonia    Rx / DC Orders ED Discharge Orders    None       Ripley Fraise, MD 02/21/20 [phone]

## 2020-02-21 NOTE — ED Notes (Signed)
Breakfast tray ordered 

## 2020-02-21 NOTE — Progress Notes (Signed)
HOSPITAL MEDICINE OVERNIGHT EVENT NOTE    Notified by nursing that patient is exhibiting a elevated lactic acid of 2.3, an increase from the initial reading of 1.5 on arrival.  Clinically, patient is ambulatory, tolerating oral intake and awake alert and oriented x3. Patient is not hypoxic or in distress.  We will hydrate patient with a modest amount of lactated Ringer solution over the course of the evening shift and repeat lactic acid level in the morning.  Vernelle Emerald  MD [facility]

## 2020-02-21 NOTE — ED Notes (Signed)
Lunch Tray Ordered @ 1034. 

## 2020-02-21 NOTE — ED Notes (Signed)
Pt ambulated around room several times. Pt started having a coughing fit and appeared more short of breath so had pt sit back down. Pt was 94% when we started and he stayed 94%-96% while ambulating and while getting back in bed

## 2020-02-22 DIAGNOSIS — J9601 Acute respiratory failure with hypoxia: Secondary | ICD-10-CM | POA: Diagnosis present

## 2020-02-22 DIAGNOSIS — I2510 Atherosclerotic heart disease of native coronary artery without angina pectoris: Secondary | ICD-10-CM | POA: Diagnosis present

## 2020-02-22 DIAGNOSIS — U071 COVID-19: Secondary | ICD-10-CM | POA: Diagnosis present

## 2020-02-22 DIAGNOSIS — J189 Pneumonia, unspecified organism: Secondary | ICD-10-CM | POA: Diagnosis present

## 2020-02-22 DIAGNOSIS — Z955 Presence of coronary angioplasty implant and graft: Secondary | ICD-10-CM | POA: Diagnosis not present

## 2020-02-22 DIAGNOSIS — Z7982 Long term (current) use of aspirin: Secondary | ICD-10-CM | POA: Diagnosis not present

## 2020-02-22 DIAGNOSIS — J45909 Unspecified asthma, uncomplicated: Secondary | ICD-10-CM | POA: Diagnosis present

## 2020-02-22 DIAGNOSIS — I10 Essential (primary) hypertension: Secondary | ICD-10-CM | POA: Diagnosis present

## 2020-02-22 DIAGNOSIS — Z808 Family history of malignant neoplasm of other organs or systems: Secondary | ICD-10-CM | POA: Diagnosis not present

## 2020-02-22 DIAGNOSIS — I252 Old myocardial infarction: Secondary | ICD-10-CM | POA: Diagnosis not present

## 2020-02-22 DIAGNOSIS — K219 Gastro-esophageal reflux disease without esophagitis: Secondary | ICD-10-CM | POA: Diagnosis present

## 2020-02-22 DIAGNOSIS — E8809 Other disorders of plasma-protein metabolism, not elsewhere classified: Secondary | ICD-10-CM | POA: Diagnosis present

## 2020-02-22 DIAGNOSIS — A4189 Other specified sepsis: Secondary | ICD-10-CM | POA: Diagnosis present

## 2020-02-22 DIAGNOSIS — E871 Hypo-osmolality and hyponatremia: Secondary | ICD-10-CM | POA: Diagnosis present

## 2020-02-22 DIAGNOSIS — Z79899 Other long term (current) drug therapy: Secondary | ICD-10-CM | POA: Diagnosis not present

## 2020-02-22 DIAGNOSIS — Z820 Family history of epilepsy and other diseases of the nervous system: Secondary | ICD-10-CM | POA: Diagnosis not present

## 2020-02-22 DIAGNOSIS — J1282 Pneumonia due to coronavirus disease 2019: Secondary | ICD-10-CM | POA: Diagnosis present

## 2020-02-22 DIAGNOSIS — Z8249 Family history of ischemic heart disease and other diseases of the circulatory system: Secondary | ICD-10-CM | POA: Diagnosis not present

## 2020-02-22 DIAGNOSIS — Z87442 Personal history of urinary calculi: Secondary | ICD-10-CM | POA: Diagnosis not present

## 2020-02-22 DIAGNOSIS — Z7951 Long term (current) use of inhaled steroids: Secondary | ICD-10-CM | POA: Diagnosis not present

## 2020-02-22 DIAGNOSIS — E785 Hyperlipidemia, unspecified: Secondary | ICD-10-CM | POA: Diagnosis present

## 2020-02-22 DIAGNOSIS — J159 Unspecified bacterial pneumonia: Secondary | ICD-10-CM | POA: Diagnosis present

## 2020-02-22 LAB — COMPREHENSIVE METABOLIC PANEL
ALT: 34 U/L (ref 0–44)
AST: 18 U/L (ref 15–41)
Albumin: 2.6 g/dL — ABNORMAL LOW (ref 3.5–5.0)
Alkaline Phosphatase: 60 U/L (ref 38–126)
Anion gap: 9 (ref 5–15)
BUN: 17 mg/dL (ref 8–23)
CO2: 22 mmol/L (ref 22–32)
Calcium: 8.8 mg/dL — ABNORMAL LOW (ref 8.9–10.3)
Chloride: 107 mmol/L (ref 98–111)
Creatinine, Ser: 0.95 mg/dL (ref 0.61–1.24)
GFR, Estimated: >60 mL/min (ref >60)
Glucose, Bld: 183 mg/dL — ABNORMAL HIGH (ref 70–99)
Potassium: 4.4 mmol/L (ref 3.5–5.1)
Sodium: 138 mmol/L (ref 135–145)
Total Bilirubin: 0.7 mg/dL (ref 0.3–1.2)
Total Protein: 6.2 g/dL — ABNORMAL LOW (ref 6.5–8.1)

## 2020-02-22 LAB — D-DIMER, QUANTITATIVE: D-Dimer, Quant: 0.46 ug/mL-FEU (ref 0.00–0.50)

## 2020-02-22 LAB — CBC WITH DIFFERENTIAL/PLATELET
Abs Immature Granulocytes: 0.1 10*3/uL — ABNORMAL HIGH (ref 0.00–0.07)
Basophils Absolute: 0 10*3/uL (ref 0.0–0.1)
Basophils Relative: 0 %
Eosinophils Absolute: 0 10*3/uL (ref 0.0–0.5)
Eosinophils Relative: 0 %
HCT: 41.1 % (ref 39.0–52.0)
Hemoglobin: 14.1 g/dL (ref 13.0–17.0)
Immature Granulocytes: 1 %
Lymphocytes Relative: 9 %
Lymphs Abs: 1.1 10*3/uL (ref 0.7–4.0)
MCH: 29.7 pg (ref 26.0–34.0)
MCHC: 34.3 g/dL (ref 30.0–36.0)
MCV: 86.7 fL (ref 80.0–100.0)
Monocytes Absolute: 0.2 10*3/uL (ref 0.1–1.0)
Monocytes Relative: 2 %
Neutro Abs: 11.8 10*3/uL — ABNORMAL HIGH (ref 1.7–7.7)
Neutrophils Relative %: 88 %
Platelets: 251 10*3/uL (ref 150–400)
RBC: 4.74 MIL/uL (ref 4.22–5.81)
RDW: 12.4 % (ref 11.5–15.5)
WBC: 13.2 10*3/uL — ABNORMAL HIGH (ref 4.0–10.5)
nRBC: 0 % (ref 0.0–0.2)

## 2020-02-22 LAB — C-REACTIVE PROTEIN: CRP: 2.6 mg/dL — ABNORMAL HIGH (ref <1.0)

## 2020-02-22 LAB — LACTIC ACID, PLASMA: Lactic Acid, Venous: 2.1 mmol/L (ref 0.5–1.9)

## 2020-02-22 LAB — FERRITIN: Ferritin: 319 ng/mL (ref 24–336)

## 2020-02-22 LAB — PREALBUMIN: Prealbumin: 19.8 mg/dL (ref 18–38)

## 2020-02-22 LAB — MAGNESIUM: Magnesium: 2.3 mg/dL (ref 1.7–2.4)

## 2020-02-22 MED ORDER — AZITHROMYCIN 250 MG PO TABS
500.0000 mg | ORAL_TABLET | Freq: Every day | ORAL | Status: DC
  Administered 2020-02-23: 500 mg via ORAL
  Filled 2020-02-22: qty 2

## 2020-02-22 NOTE — Progress Notes (Signed)
PROGRESS NOTE  Ronald Berger MLY:[id number] DOB: 1945/02/13 DOA: 02/20/2020 PCP: Loman Brooklyn, FNP   LOS: 0 days   Brief Narrative / Interim history: 75 year old male with HTN, HLD, asthma, CAD with history of PCI, nephrolithiasis comes into the hospital with cough and shortness of breath.  Patient reports that he initially tested positive for Covid on 1/17, came to the ED on 1/19 but was sent home.  He presents with worsening cough, worsening shortness of breath.  Chest x-ray done in urgent care yesterday showed bilateral pneumonia.  Reported oxygen saturations to 88% at home and decided to come to the hospital  Subjective / 24h Interval events: Better today, feels like his breathing is better  Assessment & Plan: Principal Problem Acute hypoxic respiratory failure due to COVID-19 pneumonia, CAP, sepsis-patient initially diagnosed on 1/17, appears to be outside the therapeutic window of Remdesivir.  Start steroids, he was placed empirically on antibiotics and I would favor to continue blood for now.  He is feeling better.  Supplemental oxygen to maintain sats above 92%, incentive spirometry, flutter valve  Active Problems Asthma, no exacerbation-no wheezing, stable  Hyponatremia-continue to monitor, resolved today  Hyperlipidemia -Continue statin  Hypertension -Continue metoprolol   Scheduled Meds: . vitamin C  500 mg Oral Daily  . aspirin  81 mg Oral Daily  . atorvastatin  40 mg Oral QHS  . [START ON 02/23/2020] azithromycin  500 mg Oral Daily  . diclofenac Sodium  4 g Topical QHS  . enoxaparin (LOVENOX) injection  40 mg Subcutaneous Daily  . fluticasone  2 spray Each Nare Daily  . fluticasone  2 puff Inhalation BID  . loratadine  10 mg Oral Daily  . melatonin  10 mg Oral QHS  . methylPREDNISolone (SOLU-MEDROL) injection  50 mg Intravenous BID   Followed by  . [START ON 02/24/2020] predniSONE  50 mg Oral Daily  . metoprolol tartrate  12.5 mg Oral BID  .  mometasone-formoterol  2 puff Inhalation BID  . montelukast  10 mg Oral QHS  . pantoprazole  40 mg Oral Daily  . sodium chloride flush  3 mL Intravenous Q12H  . traZODone  50 mg Oral QHS  . zinc sulfate  220 mg Oral Daily   Continuous Infusions: . cefTRIAXone (ROCEPHIN)  IV 1 g (02/22/20 0533)   PRN Meds:.guaiFENesin-dextromethorphan, ipratropium-albuterol, ondansetron **OR** ondansetron (ZOFRAN) IV  Diet Orders (From admission, onward)    Start     Ordered   02/21/20 0623  Diet Heart Room service appropriate? Yes; Fluid consistency: Thin  Diet effective now       Question Answer Comment  Room service appropriate? Yes   Fluid consistency: Thin      02/21/20 0623          DVT prophylaxis: enoxaparin (LOVENOX) injection 40 mg Start: 02/21/20 1000     Code Status: Full Code  Family Communication: no family at bedside   Status is: Observation  The patient will require care spanning > 2 midnights and should be moved to inpatient because: Inpatient level of care appropriate due to severity of illness  Dispo: The patient is from: Home              Anticipated d/c is to: Home              Anticipated d/c date is: 2 days              Patient currently is not medically stable to d/c.  Difficult to place patient No   Level of care: Med-Surg  Consultants:  None   Procedures:  None   Microbiology  None   Antimicrobials: Ceftriaxone / Azithromycin     Objective: Vitals:   02/21/20 1600 02/21/20 1644 02/21/20 2022 02/22/20 0540  BP: 126/77 131/70 139/73 128/68  Pulse: 67 66 68 60  Resp: (!) 23 19 20 18   Temp:  98 F (36.7 C) 99 F (37.2 C) 97.8 F (36.6 C)  TempSrc:  Oral    SpO2: 95% 95% 96% 96%  Weight:      Height:        Intake/Output Summary (Last 24 hours) at 02/22/2020 1408 Last data filed at 02/22/2020 0534 Gross per 24 hour  Intake 477.97 ml  Output 1000 ml  Net -522.03 ml   Filed Weights   02/20/20 2146 02/21/20 0743  Weight: 97.5 kg 97 kg     Examination:  Constitutional: NAD Eyes: no scleral icterus ENMT: Mucous membranes are moist.  Neck: normal, supple Respiratory: Diffuse bibasilar rhonchi, no wheezing, no crackles.  Tachypneic at times Cardiovascular: Regular rate and rhythm, no murmurs / rubs / gallops. No LE edema.  Abdomen: non distended, no tenderness. Bowel sounds positive.  Musculoskeletal: no clubbing / cyanosis.  Skin: no rashes Neurologic: CN 2-12 grossly intact. Strength 5/5 in all 4.    Data Reviewed: I have independently reviewed following labs and imaging studies   CBC: Recent Labs  Lab 02/20/20 2231 02/22/20 0423  WBC 19.7* 13.2*  NEUTROABS 17.4* 11.8*  HGB 14.9 14.1  HCT 44.1 41.1  MCV 87.3 86.7  PLT 276 016   Basic Metabolic Panel: Recent Labs  Lab 02/20/20 2231 02/22/20 0423  NA 133* 138  K 4.6 4.4  CL 104 107  CO2 20* 22  GLUCOSE 155* 183*  BUN 20 17  CREATININE 1.16 0.95  CALCIUM 8.1* 8.8*  MG  --  2.3   Liver Function Tests: Recent Labs  Lab 02/20/20 2231 02/22/20 0423  AST 28 18  ALT 35 34  ALKPHOS 71 60  BILITOT 1.3* 0.7  PROT 6.1* 6.2*  ALBUMIN 2.7* 2.6*   Coagulation Profile: No results for input(s): INR, PROTIME in the last 168 hours. HbA1C: No results for input(s): HGBA1C in the last 72 hours. CBG: No results for input(s): GLUCAP in the last 168 hours.  No results found for this or any previous visit (from the past 240 hour(s)).   Radiology Studies: No results found.  Marzetta Board, MD, PhD [facility]  Between 7 am - 7 pm I am available, please contact me via Amion or Securechat  Between 7 pm - 7 am I am not available, please contact night coverage MD/APP via Amion

## 2020-02-22 NOTE — Care Management Obs Status (Signed)
[place] NOTIFICATION   Patient Details  Name: Ronald Berger MRN: [id number] Date of Birth: 1945-07-25   Medicare Observation Status Notification Given:  Yes    Angelita Ingles, RN 02/22/2020, 10:30 AM

## 2020-02-23 LAB — CBC WITH DIFFERENTIAL/PLATELET
Abs Immature Granulocytes: 0.11 10*3/uL — ABNORMAL HIGH (ref 0.00–0.07)
Basophils Absolute: 0 10*3/uL (ref 0.0–0.1)
Basophils Relative: 0 %
Eosinophils Absolute: 0 10*3/uL (ref 0.0–0.5)
Eosinophils Relative: 0 %
HCT: 39.2 % (ref 39.0–52.0)
Hemoglobin: 12.9 g/dL — ABNORMAL LOW (ref 13.0–17.0)
Immature Granulocytes: 1 %
Lymphocytes Relative: 7 %
Lymphs Abs: 1.2 10*3/uL (ref 0.7–4.0)
MCH: 28.9 pg (ref 26.0–34.0)
MCHC: 32.9 g/dL (ref 30.0–36.0)
MCV: 87.7 fL (ref 80.0–100.0)
Monocytes Absolute: 0.4 10*3/uL (ref 0.1–1.0)
Monocytes Relative: 3 %
Neutro Abs: 15.4 10*3/uL — ABNORMAL HIGH (ref 1.7–7.7)
Neutrophils Relative %: 89 %
Platelets: 252 10*3/uL (ref 150–400)
RBC: 4.47 MIL/uL (ref 4.22–5.81)
RDW: 12.4 % (ref 11.5–15.5)
WBC: 17.2 10*3/uL — ABNORMAL HIGH (ref 4.0–10.5)
nRBC: 0 % (ref 0.0–0.2)

## 2020-02-23 LAB — C-REACTIVE PROTEIN: CRP: 0.9 mg/dL (ref <1.0)

## 2020-02-23 LAB — COMPREHENSIVE METABOLIC PANEL
ALT: 31 U/L (ref 0–44)
AST: 18 U/L (ref 15–41)
Albumin: 2.4 g/dL — ABNORMAL LOW (ref 3.5–5.0)
Alkaline Phosphatase: 53 U/L (ref 38–126)
Anion gap: 8 (ref 5–15)
BUN: 18 mg/dL (ref 8–23)
CO2: 22 mmol/L (ref 22–32)
Calcium: 8.3 mg/dL — ABNORMAL LOW (ref 8.9–10.3)
Chloride: 107 mmol/L (ref 98–111)
Creatinine, Ser: 1.15 mg/dL (ref 0.61–1.24)
GFR, Estimated: >60 mL/min (ref >60)
Glucose, Bld: 197 mg/dL — ABNORMAL HIGH (ref 70–99)
Potassium: 4.4 mmol/L (ref 3.5–5.1)
Sodium: 137 mmol/L (ref 135–145)
Total Bilirubin: 0.9 mg/dL (ref 0.3–1.2)
Total Protein: 5.4 g/dL — ABNORMAL LOW (ref 6.5–8.1)

## 2020-02-23 LAB — FERRITIN: Ferritin: 253 ng/mL (ref 24–336)

## 2020-02-23 LAB — MAGNESIUM: Magnesium: 2.1 mg/dL (ref 1.7–2.4)

## 2020-02-23 LAB — D-DIMER, QUANTITATIVE: D-Dimer, Quant: 0.43 ug/mL-FEU (ref 0.00–0.50)

## 2020-02-23 MED ORDER — CEFDINIR 300 MG PO CAPS: 300.0000 mg | ORAL_CAPSULE | Freq: Two times a day (BID) | ORAL | 0 refills | Status: AC

## 2020-02-23 NOTE — Progress Notes (Signed)
Discharge instructions explained, teach back completed, belongings with patient. Wife on way to pick in family car.

## 2020-02-23 NOTE — Discharge Summary (Signed)
Physician Discharge Summary  Ronald Berger:[id number] DOB: [birth year] DOA: 02/20/2020  PCP: Loman Brooklyn, FNP  Admit date: 02/20/2020 Discharge date: 02/23/2020  Admitted From: home Disposition:  home  Recommendations for Outpatient Follow-up:  1. Follow up with PCP in 1-2 weeks 2. Continue antibiotics for 5 more days, steroids for total of 10 days  Home Health: none Equipment/Devices: none  Discharge Condition: stable CODE STATUS: Full code Diet recommendation: regular  HPI: Per admitting MD, Ronald Berger is a 75 y.o. male with medical history significant of hypertension, hyperlipidemia, asthma, CAD s/p PCI, and nephrolithiasis presents with complaints of cough and shortness of breath.  He had received the initial Covid vaccines, but did not get it booster.  Patient reports that he initially tested positive for COVID-19 on 1/17  and this is documented in ED visit from 1/19.  Reported that he had cough, subjective fevers decreased appetite, muscle aches, and fatigue.  He has been using over-the-counter cough medicine at home.  He had not had any kind of infusion in the outpatient setting and had seem to be getting better from when symptoms initially started until about 2 to 3 days ago.  Complaining of worsening productive cough and shortness of breath.  His appetite had returned to normal.  He always intermittently wheezes due to history of asthma.  Planes of some chest soreness secondary to coughing. Patient was seen at urgent care yesterday where he was seen to have a bilateral pneumonia.  He was prescribed a Z-Pak and prednisone.  Patient had been instructed to return to the emergency department if O2 saturations dropped below 90%.  At home patient reports that O2 saturations dropped down to 88% prompting him to come to the hospital.  Hospital Course / Discharge diagnoses: Principal Problem Acute hypoxic respiratory failure due to COVID-19 pneumonia, CAP, sepsis-patient  initially diagnosed on 1/17, appears to be outside the therapeutic window of Remdesivir.  Due to concern for bacterial infection he was started on antibiotics along with steroids.  He improved, he was weaned off to room air, able to ambulate in the hallway without any difficulties, was discharged home in stable condition with 5 additional days of antibiotics and to complete a 10-day course of steroids.  Active Problems Asthma, no exacerbation-no wheezing, stable Hyponatremia-resolved Hyperlipidemia -Continue statin Hypertension -Continue metoprolol  Discharge Instructions   Allergies as of 02/23/2020      Reactions   Bee Venom Shortness Of Breath   Oxycodone Nausea And Vomiting   Percocet [oxycodone-acetaminophen] Nausea And Vomiting      Medication List    STOP taking these medications   azithromycin 250 MG tablet Commonly known as: ZITHROMAX     TAKE these medications   albuterol 108 (90 Base) MCG/ACT inhaler Commonly known as: VENTOLIN HFA Inhale 2 puffs into the lungs every 6 (six) hours as needed for wheezing or shortness of breath. What changed: when to take this   Arnuity Ellipta 200 MCG/ACT Aepb Generic drug: Fluticasone Furoate INHALE 1 PUFF INTO THE LUNGS DAILY.   aspirin 81 MG chewable tablet Chew 1 tablet (81 mg total) by mouth daily.   atorvastatin 40 MG tablet Commonly known as: LIPITOR Take 40 mg by mouth at bedtime.   benzonatate 100 MG capsule Commonly known as: Tessalon Perles Take 1 capsule (100 mg total) by mouth 3 (three) times daily as needed. What changed: reasons to take this   budesonide-formoterol 80-4.5 MCG/ACT inhaler Commonly known as: Symbicort Inhale 2 puffs into  the lungs 2 (two) times daily.   cefdinir 300 MG capsule Commonly known as: OMNICEF Take 1 capsule (300 mg total) by mouth 2 (two) times daily for 5 days.   cetirizine 10 MG tablet Commonly known as: ZyrTEC Allergy Take 1 tablet (10 mg total) by mouth daily.    diclofenac Sodium 1 % Gel Commonly known as: VOLTAREN APPLY 4 GRAMS TOPICALLY 4 (FOUR) TIMES DAILY. What changed: See the new instructions.   Emergen-C Blue Pack Take 1 each by mouth daily.   EPINEPHrine 0.3 mg/0.3 mL Soaj injection Commonly known as: EPI-PEN Inject 0.3 mg into the muscle as needed for anaphylaxis. What changed: reasons to take this   fluticasone 50 MCG/ACT nasal spray Commonly known as: FLONASE USE 2 SPRAYS IN EACH NOSTRIL EVERY DAY   guaiFENesin 600 MG 12 hr tablet Commonly known as: MUCINEX Take 1,200 mg by mouth 2 (two) times daily as needed for to loosen phlegm.   ibuprofen 200 MG tablet Commonly known as: ADVIL Take 200 mg by mouth every 6 (six) hours as needed.   levocetirizine 5 MG tablet Commonly known as: XYZAL Take 1 tablet (5 mg total) by mouth every evening. What changed: when to take this   Melatonin 10 MG Tabs Take 10 mg by mouth at bedtime.   metoprolol tartrate 25 MG tablet Commonly known as: LOPRESSOR Take 0.5 tablets (12.5 mg total) by mouth 2 (two) times daily.   montelukast 10 MG tablet Commonly known as: SINGULAIR Take 1 tablet (10 mg total) by mouth at bedtime.   nitroGLYCERIN 0.4 MG SL tablet Commonly known as: NITROSTAT Place 1 tablet (0.4 mg total) under the tongue every 5 (five) minutes as needed for chest pain. What changed: reasons to take this   omeprazole 20 MG capsule Commonly known as: PRILOSEC Take 1 capsule (20 mg total) by mouth every evening.   predniSONE 10 MG tablet Commonly known as: DELTASONE Take 2 tablets (20 mg total) by mouth daily for 10 days.   promethazine-dextromethorphan 6.25-15 MG/5ML syrup Commonly known as: PROMETHAZINE-DM Take 5 mLs by mouth 4 (four) times daily as needed for cough.   Suprep Bowel Prep Kit 17.5-3.13-1.6 GM/177ML Soln Generic drug: Na Sulfate-K Sulfate-Mg Sulf Take 1 kit by mouth as directed. For colonoscopy prep BIN: [id number] PCN: [id number] GROUP: [id number] ID: [id number]    traZODone 50 MG tablet Commonly known as: DESYREL Take 1 tablet (50 mg total) by mouth at bedtime as needed for sleep. What changed: when to take this      Consultations:  None   Procedures/Studies:  DG Chest 2 View  Result Date: 02/20/2020 CLINICAL DATA:  COVID positive.  Chest pain. EXAM: CHEST - 2 VIEW COMPARISON:  01/03/2020 FINDINGS: Patchy nodular bilateral airspace disease is new since prior study. No pleural effusion. The cardiopericardial silhouette is within normal limits for size. The visualized bony structures of the thorax show no acute abnormality. IMPRESSION: New patchy nodular bilateral airspace disease consistent with multifocal pneumonia. Electronically Signed   By: Misty Stanley M.D.   On: 02/20/2020 11:34   DG Chest Portable 1 View  Result Date: 02/20/2020 CLINICAL DATA:  Shortness of breath.  COVID positive. EXAM: PORTABLE CHEST 1 VIEW COMPARISON:  Radiograph earlier today. FINDINGS: Multifocal peripheral predominant patchy and nodular opacities throughout both lungs typical of multifocal pneumonia. No significant change from earlier today. No pneumothorax or large pleural effusion. Stable heart size and mediastinal contours. No acute osseous abnormalities are seen. IMPRESSION: Unchanged bilateral COVID pneumonia from earlier  today. Electronically Signed   By: Keith Rake M.D.   On: 02/20/2020 22:34      Subjective: - no chest pain, shortness of breath, no abdominal pain, nausea or vomiting.   Discharge Exam: BP 122/67 (BP Location: Left Arm)   Pulse 62   Temp 97.9 F (36.6 C) (Oral)   Resp 16   Ht '5\' 10"'  (1.778 m)   Wt 97 kg   SpO2 97%   BMI 30.68 kg/m   General: Pt is alert, awake, not in acute distress Cardiovascular: RRR, S1/S2 +, no rubs, no gallops Respiratory: CTA bilaterally, no wheezing, no rhonchi Abdominal: Soft, NT, ND, bowel sounds + Extremities: no edema, no cyanosis    The results of significant diagnostics from this  hospitalization (including imaging, microbiology, ancillary and laboratory) are listed below for reference.     Microbiology: No results found for this or any previous visit (from the past 240 hour(s)).   Labs: Basic Metabolic Panel: Recent Labs  Lab 02/20/20 2231 02/22/20 0423 02/23/20 0234  NA 133* 138 137  K 4.6 4.4 4.4  CL 104 107 107  CO2 20* 22 22  GLUCOSE 155* 183* 197*  BUN '20 17 18  ' CREATININE 1.16 0.95 1.15  CALCIUM 8.1* 8.8* 8.3*  MG  --  2.3 2.1   Liver Function Tests: Recent Labs  Lab 02/20/20 2231 02/22/20 0423 02/23/20 0234  AST '28 18 18  ' ALT 35 34 31  ALKPHOS 71 60 53  BILITOT 1.3* 0.7 0.9  PROT 6.1* 6.2* 5.4*  ALBUMIN 2.7* 2.6* 2.4*   CBC: Recent Labs  Lab 02/20/20 2231 02/22/20 0423 02/23/20 0234  WBC 19.7* 13.2* 17.2*  NEUTROABS 17.4* 11.8* 15.4*  HGB 14.9 14.1 12.9*  HCT 44.1 41.1 39.2  MCV 87.3 86.7 87.7  PLT 276 251 252   CBG: No results for input(s): GLUCAP in the last 168 hours. Hgb A1c No results for input(s): HGBA1C in the last 72 hours. Lipid Profile No results for input(s): CHOL, HDL, LDLCALC, TRIG, CHOLHDL, LDLDIRECT in the last 72 hours. Thyroid function studies No results for input(s): TSH, T4TOTAL, T3FREE, THYROIDAB in the last 72 hours.  Invalid input(s): FREET3 Urinalysis    Component Value Date/Time   COLORURINE YELLOW 02/20/2020 2218   APPEARANCEUR CLEAR 02/20/2020 2218   LABSPEC 1.023 02/20/2020 2218   PHURINE 5.0 02/20/2020 2218   [place] 02/20/2020 2218   HGBUR NEGATIVE 02/20/2020 2218   [place] 02/20/2020 2218   KETONESUR NEGATIVE 02/20/2020 2218   PROTEINUR NEGATIVE 02/20/2020 2218   NITRITE NEGATIVE 02/20/2020 2218   LEUKOCYTESUR NEGATIVE 02/20/2020 2218    FURTHER DISCHARGE INSTRUCTIONS:   Get Medicines reviewed and adjusted: Please take all your medications with you for your next visit with your Primary MD   Laboratory/radiological data: Please request your Primary MD to  go over all hospital tests and procedure/radiological results at the follow up, please ask your Primary MD to get all Hospital records sent to his/her office.   In some cases, they will be blood work, cultures and biopsy results pending at the time of your discharge. Please request that your primary care M.D. goes through all the records of your hospital data and follows up on these results.   Also Note the following: If you experience worsening of your admission symptoms, develop shortness of breath, life threatening emergency, suicidal or homicidal thoughts you must seek medical attention immediately by calling 911 or calling your MD immediately  if symptoms less severe.  You must read complete instructions/literature along with all the possible adverse reactions/side effects for all the Medicines you take and that have been prescribed to you. Take any new Medicines after you have completely understood and accpet all the possible adverse reactions/side effects.    Do not drive when taking Pain medications or sleeping medications (Benzodaizepines)   Do not take more than prescribed Pain, Sleep and Anxiety Medications. It is not advisable to combine anxiety,sleep and pain medications without talking with your primary care practitioner   Special Instructions: If you have smoked or chewed Tobacco  in the last 2 yrs please stop smoking, stop any regular Alcohol  and or any Recreational drug use.   Wear Seat belts while driving.   Please note: You were cared for by a hospitalist during your hospital stay. Once you are discharged, your primary care physician will handle any further medical issues. Please note that NO REFILLS for any discharge medications will be authorized once you are discharged, as it is imperative that you return to your primary care physician (or establish a relationship with a primary care physician if you do not have one) for your post hospital discharge needs so that they can  reassess your need for medications and monitor your lab values.  Time coordinating discharge: 35 minutes  SIGNED:  Marzetta Board, MD, PhD 02/23/2020, 9:12 AM

## 2020-02-24 ENCOUNTER — Telehealth: Payer: Self-pay

## 2020-02-24 NOTE — Telephone Encounter (Signed)
Transition Care Management Unsuccessful Follow-up Telephone Call  Date of discharge and from where:  02/23/2020 from [facility]  Attempts:  1st Attempt  Reason for unsuccessful TCM follow-up call:  Left voice message

## 2020-02-27 ENCOUNTER — Telehealth: Payer: Self-pay | Admitting: Family Medicine

## 2020-02-27 NOTE — Telephone Encounter (Signed)
Appointment scheduled.

## 2020-02-27 NOTE — Telephone Encounter (Signed)
Pt needs hospital f/u. Was in hospital for COVID pneumonia but is now testing negative, still has cough and hoarseness in voice

## 2020-02-27 NOTE — Telephone Encounter (Signed)
Transition Care Management Unsuccessful Follow-up Telephone Call  Date of discharge and from where:  02/23/2020 from [facility]  Attempts:  2nd Attempt  Reason for unsuccessful TCM follow-up call:  Left voice message

## 2020-02-28 ENCOUNTER — Encounter: Payer: Self-pay | Admitting: Family

## 2020-02-28 ENCOUNTER — Ambulatory Visit (INDEPENDENT_AMBULATORY_CARE_PROVIDER_SITE_OTHER): Payer: Medicare HMO

## 2020-02-28 ENCOUNTER — Ambulatory Visit (INDEPENDENT_AMBULATORY_CARE_PROVIDER_SITE_OTHER): Payer: Medicare HMO | Admitting: Family

## 2020-02-28 VITALS — BP 136/76 | HR 81 | Temp 97.3°F | Ht 70.0 in | Wt 210.0 lb

## 2020-02-28 DIAGNOSIS — U071 COVID-19: Secondary | ICD-10-CM

## 2020-02-28 DIAGNOSIS — Z8616 Personal history of COVID-19: Secondary | ICD-10-CM

## 2020-02-28 DIAGNOSIS — J019 Acute sinusitis, unspecified: Secondary | ICD-10-CM

## 2020-02-28 DIAGNOSIS — Z09 Encounter for follow-up examination after completed treatment for conditions other than malignant neoplasm: Secondary | ICD-10-CM

## 2020-02-28 DIAGNOSIS — J1282 Pneumonia due to coronavirus disease 2019: Secondary | ICD-10-CM | POA: Diagnosis not present

## 2020-02-28 MED ORDER — AMOXICILLIN-POT CLAVULANATE 875-125 MG PO TABS
1.0000 | ORAL_TABLET | Freq: Two times a day (BID) | ORAL | 0 refills | Status: DC
Start: 2020-02-28 — End: 2020-03-16

## 2020-02-28 NOTE — Telephone Encounter (Signed)
Transition Care Management Unsuccessful Follow-up Telephone Call  Date of discharge and from where:  02/23/2020 from [facility]  Attempts:  3rd Attempt  Reason for unsuccessful TCM follow-up call:  Left voice message

## 2020-02-28 NOTE — Patient Instructions (Signed)

## 2020-02-28 NOTE — Progress Notes (Signed)
Subjective:    Patient ID: Ronald Berger, male    DOB: 12/02/1945, 75 y.o.   MRN: [id number] ' Chief Complaint  Patient presents with  . Hospitalization Follow-up    Pna covid + 02/06/20   PT presents to the office today for hospital follow up. He was diagnosed with COVID on 02/06/20. His symptoms worsen with SOB and decreased O2. He was admitted on 02/20/20 with COVID pneumonia and discharged on 02/23/20.   He was discharged on Cefdinir 300 mg BID  for 5 days and prednisone dose pack.   He reports his breathing is much better, but complaining of sinus pressure and blood with his mucous.  Sinusitis This is a new problem. The current episode started 1 to 4 weeks ago. The problem has been waxing and waning since onset. There has been no fever. His pain is at a severity of 2/10. The pain is mild. Associated symptoms include congestion, coughing ("some times when I walk in the cold"), headaches, shortness of breath and sinus pressure. Pertinent negatives include no ear pain or sneezing.      Review of Systems  HENT: Positive for congestion and sinus pressure. Negative for ear pain and sneezing.   Respiratory: Positive for cough ("some times when I walk in the cold") and shortness of breath.   Neurological: Positive for headaches.  All other systems reviewed and are negative.      Objective:   Physical Exam Vitals reviewed.  Constitutional:      General: He is not in acute distress.    Appearance: He is well-developed and well-nourished.  HENT:     Head: Normocephalic.     Right Ear: Tympanic membrane normal.     Left Ear: Tympanic membrane normal.     Mouth/Throat:     Mouth: Oropharynx is clear and moist.  Eyes:     General:        Right eye: No discharge.        Left eye: No discharge.     Pupils: Pupils are equal, round, and reactive to light.  Neck:     Thyroid: No thyromegaly.  Cardiovascular:     Rate and Rhythm: Normal rate and regular rhythm.     Pulses:  Intact distal pulses.     Heart sounds: Normal heart sounds. No murmur heard.   Pulmonary:     Effort: Pulmonary effort is normal. No respiratory distress.     Breath sounds: Normal breath sounds. No wheezing.  Abdominal:     General: Bowel sounds are normal. There is no distension.     Palpations: Abdomen is soft.     Tenderness: There is no abdominal tenderness.  Musculoskeletal:        General: No tenderness or edema. Normal range of motion.     Cervical back: Normal range of motion and neck supple.  Skin:    General: Skin is warm and dry.     Findings: No erythema or rash.  Neurological:     Mental Status: He is alert and oriented to person, place, and time.     Cranial Nerves: No cranial nerve deficit.     Deep Tendon Reflexes: Reflexes are normal and symmetric.  Psychiatric:        Mood and Affect: Mood and affect normal.        Behavior: Behavior normal.        Thought Content: Thought content normal.        Judgment: Judgment normal.  BP 136/76   Pulse 81   Temp (!) 97.3 F (36.3 C) (Temporal)   Ht '5\' 10"'  (1.778 m)   Wt 210 lb (95.3 kg)   SpO2 94%   BMI 30.13 kg/m      Assessment & Plan:  1. Pneumonia due to COVID-19 virus - CMP14+EGFR - CBC with Differential/Platelet - DG Chest 2 View; Future  2. Hospital discharge follow-up -Hospital follow up - CMP14+EGFR - CBC with Differential/Platelet - DG Chest 2 View; Future  3. Acute sinusitis, recurrence not specified, unspecified location - Take meds as prescribed - Use a cool mist humidifier  -Use saline nose sprays frequently -Force fluids -For any cough or congestion  Use plain Mucinex- regular strength or max strength is fine -For fever or aces or pains- take tylenol or ibuprofen. -Throat lozenges if helps RTO if symptoms worsen or do not improve  - CMP14+EGFR - CBC with Differential/Platelet - amoxicillin-clavulanate (AUGMENTIN) 875-125 MG tablet; Take 1 tablet by mouth 2 (two) times  daily.  Dispense: 14 tablet; Refill: 0   Evelina Dun, FNP

## 2020-02-29 ENCOUNTER — Other Ambulatory Visit: Payer: Self-pay

## 2020-02-29 ENCOUNTER — Other Ambulatory Visit: Payer: Self-pay | Admitting: Family

## 2020-02-29 DIAGNOSIS — R7309 Other abnormal glucose: Secondary | ICD-10-CM

## 2020-02-29 LAB — CMP14+EGFR
ALT: 52 IU/L — ABNORMAL HIGH (ref 0–44)
AST: 16 IU/L (ref 0–40)
Albumin/Globulin Ratio: 1.6 (ref 1.2–2.2)
Albumin: 3.6 g/dL — ABNORMAL LOW (ref 3.7–4.7)
Alkaline Phosphatase: 99 IU/L (ref 44–121)
BUN/Creatinine Ratio: 20 (ref 10–24)
BUN: 17 mg/dL (ref 8–27)
Bilirubin Total: 0.2 mg/dL (ref 0.0–1.2)
CO2: 20 mmol/L (ref 20–29)
Calcium: 8.6 mg/dL (ref 8.6–10.2)
Chloride: 102 mmol/L (ref 96–106)
Creatinine, Ser: 0.84 mg/dL (ref 0.76–1.27)
GFR calc Af Amer: 101 mL/min/{1.73_m2} (ref >59)
GFR calc non Af Amer: 87 mL/min/{1.73_m2} (ref >59)
Globulin, Total: 2.2 g/dL (ref 1.5–4.5)
Glucose: 236 mg/dL — ABNORMAL HIGH (ref 65–99)
Potassium: 4.7 mmol/L (ref 3.5–5.2)
Sodium: 138 mmol/L (ref 134–144)
Total Protein: 5.8 g/dL — ABNORMAL LOW (ref 6.0–8.5)

## 2020-02-29 LAB — CBC WITH DIFFERENTIAL/PLATELET
Basophils Absolute: 0 10*3/uL (ref 0.0–0.2)
Basos: 0 %
EOS (ABSOLUTE): 0 10*3/uL (ref 0.0–0.4)
Eos: 0 %
Hematocrit: 43.9 % (ref 37.5–51.0)
Hemoglobin: 14.9 g/dL (ref 13.0–17.7)
Immature Grans (Abs): 0.1 10*3/uL (ref 0.0–0.1)
Immature Granulocytes: 1 %
Lymphocytes Absolute: 1.4 10*3/uL (ref 0.7–3.1)
Lymphs: 11 %
MCH: 29.6 pg (ref 26.6–33.0)
MCHC: 33.9 g/dL (ref 31.5–35.7)
MCV: 87 fL (ref 79–97)
Monocytes Absolute: 0.4 10*3/uL (ref 0.1–0.9)
Monocytes: 3 %
Neutrophils Absolute: 10.7 10*3/uL — ABNORMAL HIGH (ref 1.4–7.0)
Neutrophils: 85 %
Platelets: 232 10*3/uL (ref 150–450)
RBC: 5.03 x10E6/uL (ref 4.14–5.80)
RDW: 12.5 % (ref 11.6–15.4)
WBC: 12.7 10*3/uL — ABNORMAL HIGH (ref 3.4–10.8)

## 2020-02-29 MED ORDER — NITROGLYCERIN 0.4 MG SL SUBL: 0.4000 mg | SUBLINGUAL_TABLET | SUBLINGUAL | 12 refills | Status: DC | PRN

## 2020-03-02 LAB — SPECIMEN STATUS REPORT

## 2020-03-02 LAB — HGB A1C W/O EAG: Hgb A1c MFr Bld: 6.6 % — ABNORMAL HIGH (ref 4.8–5.6)

## 2020-03-11 ENCOUNTER — Ambulatory Visit
Admission: RE | Admit: 2020-03-11 | Discharge: 2020-03-11 | Disposition: A | Discharge status: Home / Self Care | Payer: Medicare HMO | Source: Ambulatory Visit | Attending: Family Medicine | Admitting: Family Medicine

## 2020-03-11 ENCOUNTER — Other Ambulatory Visit: Payer: Self-pay

## 2020-03-11 VITALS — BP 159/65 | HR 74 | Temp 97.9°F | Resp 18

## 2020-03-11 DIAGNOSIS — J1282 Pneumonia due to coronavirus disease 2019: Secondary | ICD-10-CM

## 2020-03-11 DIAGNOSIS — B001 Herpesviral vesicular dermatitis: Secondary | ICD-10-CM | POA: Diagnosis not present

## 2020-03-11 DIAGNOSIS — R0981 Nasal congestion: Secondary | ICD-10-CM | POA: Diagnosis not present

## 2020-03-11 DIAGNOSIS — R059 Cough, unspecified: Secondary | ICD-10-CM

## 2020-03-11 DIAGNOSIS — U071 COVID-19: Secondary | ICD-10-CM

## 2020-03-11 MED ORDER — PREDNISONE 20 MG PO TABS: 20.0000 mg | ORAL_TABLET | Freq: Every day | ORAL | 0 refills | Status: AC

## 2020-03-11 MED ORDER — VALACYCLOVIR HCL 1 G PO TABS: 1000.0000 mg | ORAL_TABLET | Freq: Three times a day (TID) | ORAL | 0 refills | Status: AC

## 2020-03-11 MED ORDER — PROMETHAZINE-DM 6.25-15 MG/5ML PO SYRP: 5.0000 mL | ORAL_SOLUTION | Freq: Four times a day (QID) | ORAL | 0 refills | Status: DC | PRN

## 2020-03-11 NOTE — ED Triage Notes (Signed)
Was hospitalized with covid pneumonia 2 weeks ago.  States cough is getting  worse again and thinks he may have a sinus infection.  Fever blister on lip.

## 2020-03-11 NOTE — Discharge Instructions (Addendum)
I have sent in Valtrex for you to take three times per day for 7 days  I have sent in prednisone for you to take for 5 days in the morning  I have sent in cough syrup for you to take. This medication can make you sleepy. Do not drive while taking this medication.  Follow up with this office or with primary care if symptoms are persisting.  Follow up in the ER for high fever, trouble swallowing, trouble breathing, other concerning symptoms.

## 2020-03-15 NOTE — ED Provider Notes (Signed)
[place]   [id number] 03/11/20 Arrival Time: 3825   CC: COVID symptoms  SUBJECTIVE: History from: patient.  Ronald Berger is a 75 y.o. male who presents with persistent cough after being hospitalized for Covid pneumonia x 2 weeks ago. Reports that he is still feeling fatigued, but better. Reports that he developed a fever blister while he was in the hospital that is also persisting. Has been using topical OTC meds for feverl blister. Reports that he has a new fever blister to his left lower lip x 2 days. Has not completed Covid vaccines. Has been taking prescribed promethazine cough syrup with some relief. Requesting refill today. Cough and fatigue are aggravated with activity. Denies fever, chills, fatigue, sinus pain, rhinorrhea, sore throat, SOB, wheezing, chest pain, nausea, changes in bowel or bladder habits.    ROS: As per HPI.  All other pertinent ROS negative.     Past Medical History:  Diagnosis Date  . Allergic rhinitis   . Allergy   . 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  . Bilateral carpal tunnel syndrome 12/08/2018  . 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 NSTEMI 07/2013 - DES to mLCx, DES to prox OM1, DES to mLAD with nonobstructive RCA stenosis, EF 55-65%.  . Chronic kidney disease    kidney stones   . Dyslipidemia   . Family history of bone cancer   . GERD (gastroesophageal reflux disease)   . History of kidney stones   . History of non-ST elevation myocardial infarction (NSTEMI)    07/ 2015  S/P  DES X3  . History of palpitations   . Hyperlipidemia   . Hypertension   . Insomnia 12/08/2018   x many years  . Myocardial infarction ([place]) 2015   NSTEMI- 3 stents   . OSA (obstructive sleep apnea)    cpap intolerant  . Right knee meniscal tear   . S/P drug eluting coronary stent placement    07/ 2015  x3  to mLCFX, OM1, mLAD  . Sinus bradycardia   .  Sleep apnea    no cpap    Past Surgical History:  Procedure Laterality Date  . CATARACT EXTRACTION, BILATERAL    . COLONOSCOPY     >10 yrs ago-polyps per pt- we have no report   . EXTRACORPOREAL SHOCK WAVE LITHOTRIPSY  x2 last one 2005  . KNEE ARTHROSCOPY Right 05/02/2015   Procedure: RIGHT KNEE ARTHROSCOPY WITH medial meniscal DEBRIDEMENT ;  Surgeon: Gaynelle Arabian, MD;  Location: [facility];  Service: Orthopedics;  Laterality: Right;  . LEFT HEART CATHETERIZATION WITH CORONARY ANGIOGRAM N/A 08/05/2013   Procedure: LEFT HEART CATHETERIZATION WITH CORONARY ANGIOGRAM;  Surgeon: Blane Ohara, MD;  Location: [facility] CATH LAB;  Service: Cardiovascular;  Laterality: N/A;  Promus DES's to  mLCFx,  OM1,  mLAD (total 3)/   mRCA 30%,  dLM  20-30%,  preserved LVSF, ef 55-65%  . LUMBAR SPINE SURGERY  x3  last one 1980's  . SHOULDER ARTHROSCOPY Right 2013  . TONSILLECTOMY     Allergies  Allergen Reactions  . Bee Venom Shortness Of Breath  . Oxycodone Nausea And Vomiting  . Percocet [Oxycodone-Acetaminophen] Nausea And Vomiting   No current facility-administered medications on file prior to encounter.   Current Outpatient Medications on File Prior to Encounter  Medication Sig Dispense Refill  . albuterol (VENTOLIN HFA) 108 (90  Base) MCG/ACT inhaler Inhale 2 puffs into the lungs every 6 (six) hours as needed for wheezing or shortness of breath. (Patient taking differently: Inhale 2 puffs into the lungs every 4 (four) hours as needed for wheezing or shortness of breath.) 8 g 2  . amoxicillin-clavulanate (AUGMENTIN) 875-125 MG tablet Take 1 tablet by mouth 2 (two) times daily. 14 tablet 0  . ARNUITY ELLIPTA 200 MCG/ACT AEPB INHALE 1 PUFF INTO THE LUNGS DAILY. 90 each 0  . aspirin 81 MG chewable tablet Chew 1 tablet (81 mg total) by mouth daily.    Marland Kitchen atorvastatin (LIPITOR) 40 MG tablet Take 40 mg by mouth at bedtime.    . budesonide-formoterol (SYMBICORT) 80-4.5 MCG/ACT inhaler Inhale 2  puffs into the lungs 2 (two) times daily. 1 each 5  . cetirizine (ZYRTEC ALLERGY) 10 MG tablet Take 1 tablet (10 mg total) by mouth daily. (Patient not taking: No sig reported) 30 tablet 0  . EPINEPHrine 0.3 mg/0.3 mL IJ SOAJ injection Inject 0.3 mg into the muscle as needed for anaphylaxis. (Patient not taking: Reported on 02/28/2020) 1 each 1  . fluticasone (FLONASE) 50 MCG/ACT nasal spray USE 2 SPRAYS IN EACH NOSTRIL EVERY DAY 48 g 1  . guaiFENesin (MUCINEX) 600 MG 12 hr tablet Take 1,200 mg by mouth 2 (two) times daily as needed for to loosen phlegm.    Marland Kitchen ibuprofen (ADVIL) 200 MG tablet Take 200 mg by mouth every 6 (six) hours as needed.    Marland Kitchen levocetirizine (XYZAL) 5 MG tablet Take 1 tablet (5 mg total) by mouth every evening. (Patient taking differently: Take 5 mg by mouth daily.) 90 tablet 3  . Melatonin 10 MG TABS Take 10 mg by mouth at bedtime.    . metoprolol tartrate (LOPRESSOR) 25 MG tablet Take 0.5 tablets (12.5 mg total) by mouth 2 (two) times daily. 90 tablet 3  . montelukast (SINGULAIR) 10 MG tablet Take 1 tablet (10 mg total) by mouth at bedtime. 30 tablet 5  . Multiple Vitamins-Minerals (EMERGEN-C BLUE) PACK Take 1 each by mouth daily.    . nitroGLYCERIN (NITROSTAT) 0.4 MG SL tablet Place 1 tablet (0.4 mg total) under the tongue every 5 (five) minutes as needed for chest pain. 25 tablet 12  . omeprazole (PRILOSEC) 20 MG capsule Take 1 capsule (20 mg total) by mouth every evening. 90 capsule 3  . traZODone (DESYREL) 50 MG tablet Take 1 tablet (50 mg total) by mouth at bedtime as needed for sleep. (Patient taking differently: Take 50 mg by mouth at bedtime.) 90 tablet 2   Social History   Socioeconomic History  . Marital status: Married    Spouse name: Joelene Millin  . Number of children: 1  . Years of education: 82  . Highest education level: Some college, no degree  Occupational History  . Occupation: [employer]    Comment: Retired - prior Oceanographer  Tobacco Use   . Smoking status: Former Smoker    Years: 1.00    Types: Cigarettes    Quit date: 04/27/1966    Years since quitting: 53.9  . Smokeless tobacco: Never Used  Vaping Use  . Vaping Use: Never used  Substance and Sexual Activity  . Alcohol use: Yes    Comment: occasional  . Drug use: No  . Sexual activity: Yes  Other Topics Concern  . Not on file  Social History Narrative  . Not on file   Social Determinants of Health   Financial Resource Strain:  Not on file  Food Insecurity: Not on file  Transportation Needs: Not on file  Physical Activity: Not on file  Stress: Not on file  Social Connections: Not on file  Intimate Partner Violence: Not on file   Family History  Problem Relation Age of Onset  . Coronary artery disease Mother        CABG  . CAD Sister   . Bone cancer Sister   . Alzheimer's disease Father   . Aneurysm Brother        brain  . Heart failure Sister   . Bone cancer Sister   . Colon cancer Neg Hx   . Colon polyps Neg Hx   . Esophageal cancer Neg Hx   . Rectal cancer Neg Hx   . Stomach cancer Neg Hx     OBJECTIVE:  Vitals:   03/11/20 0917  BP: (!) 159/65  Pulse: 74  Resp: 18  Temp: 97.9 F (36.6 C)  TempSrc: Oral  SpO2: 92%     General appearance: alert; appears fatigued, but nontoxic; speaking in full sentences and tolerating own secretions HEENT: NCAT; Ears: EACs clear, TMs pearly gray; Eyes: PERRL.  EOM grossly intact. 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  Radial pulses 2+ symmetrical bilaterally Skin: warm and  dry Psychological: alert and cooperative; normal mood and affect  LABS:  No results found for this or any previous visit (from the past 24 hour(s)).   ASSESSMENT & PLAN:  1. Cough   2. Pneumonia due to COVID-19 virus   3. Nasal congestion   4. Fever blister     Meds ordered this encounter  Medications  . promethazine-dextromethorphan (PROMETHAZINE-DM) 6.25-15 MG/5ML syrup    Sig: Take 5 mLs by mouth 4 (four) times daily as needed for cough.    Dispense:  118 mL    Refill:  0    Order Specific Question:   Supervising Provider    Answer:   Chase Picket [id number]  . predniSONE (DELTASONE) 20 MG tablet    Sig: Take 1 tablet (20 mg total) by mouth daily with breakfast for 5 days.    Dispense:  5 tablet    Refill:  0    Order Specific Question:   Supervising Provider    Answer:   Chase Picket [id number]  . valACYclovir (VALTREX) 1000 MG tablet    Sig: Take 1 tablet (1,000 mg total) by mouth 3 (three) times daily for 7 days.    Dispense:  21 tablet    Refill:  0    Order Specific Question:   Supervising Provider    Answer:   Chase Picket [[id number]]    Refilled promethazine syrup Prescribed steroids Prescribed Valtrex Discussed disease course and that his cough may still persist over the next few weeks, but should gradually improve Continue supportive care at home Get plenty of rest and push fluids Use OTC zyrtec for nasal congestion, runny nose, and/or sore throat Use OTC flonase for nasal congestion and runny nose Use medications daily for symptom relief Use OTC medications like ibuprofen or tylenol as needed fever or pain Call or go to the ED if you  have any new or worsening symptoms such as fever, worsening cough, shortness of breath, chest tightness, chest pain, turning blue, changes in mental status.  Reviewed expectations re: course of current medical issues. Questions answered. Outlined signs and symptoms indicating need for more acute  intervention. Patient verbalized understanding. After Visit Summary given.         Faustino Congress, NP 03/15/20 1844

## 2020-03-16 ENCOUNTER — Ambulatory Visit (INDEPENDENT_AMBULATORY_CARE_PROVIDER_SITE_OTHER): Payer: Medicare HMO | Admitting: Family Medicine

## 2020-03-16 ENCOUNTER — Other Ambulatory Visit: Payer: Self-pay

## 2020-03-16 ENCOUNTER — Encounter: Payer: Self-pay | Admitting: Family Medicine

## 2020-03-16 VITALS — BP 129/72 | HR 70 | Temp 97.7°F | Ht 70.0 in | Wt 216.8 lb

## 2020-03-16 DIAGNOSIS — R7989 Other specified abnormal findings of blood chemistry: Secondary | ICD-10-CM

## 2020-03-16 DIAGNOSIS — R7309 Other abnormal glucose: Secondary | ICD-10-CM

## 2020-03-16 DIAGNOSIS — Z8701 Personal history of pneumonia (recurrent): Secondary | ICD-10-CM

## 2020-03-16 NOTE — Progress Notes (Signed)
Assessment & Plan:  1. History of pneumonia Improving.  2. Elevated hemoglobin A1c Has been on steroids and is still on them.  Discussed his A1c is likely elevated due to this.  He is going to work on his diet.  He is already exercising by being so active at work all day.  Plan to recheck in 3 months.  3. Elevated LFTs Discussed this often happens with COVID-19.  Plan to recheck in 3 months.   Return in about 3 months (around 06/13/2020) for follow-up of chronic medication conditions.  Hendricks Limes, MSN, APRN, FNP-C [facility]  Subjective:    Patient ID: Ronald Berger, male    DOB: [birth year], 75 y.o.   MRN: [id number]  Patient Care Team: Loman Brooklyn, FNP as PCP - General (Family Medicine) Debara Pickett Nadean Corwin, MD as PCP - Cardiology (Cardiology)   Chief Complaint:  Chief Complaint  Patient presents with  . Pneumonia    2 week follow up- Patient states cough is better but still there.     HPI: HABEEB PUERTAS is a 75 y.o. male presenting on 03/16/2020 for Pneumonia (2 week follow up- Patient states cough is better but still there./)  Patient is following up on pneumonia.  He reports he is getting better.  He does still have a cough.  He feels his strength pain during this is increasing.  He has gone back to work.  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  New complaints: He is concerned that on his last lab work his A1c was elevated at 6.6 and his ALT was elevated at 52.  Social history:  Relevant past medical, surgical, family and social history reviewed and updated as indicated. Interim medical history since our last visit reviewed.  Allergies and medications reviewed and updated.  DATA REVIEWED: CHART IN EPIC  ROS: Negative unless specifically indicated above in HPI.    Current Outpatient Medications:  .  albuterol (VENTOLIN HFA) 108 (90 Base) MCG/ACT inhaler, Inhale 2 puffs into the lungs every 6 (six) hours as needed  for wheezing or shortness of breath. (Patient taking differently: Inhale 2 puffs into the lungs every 4 (four) hours as needed for wheezing or shortness of breath.), Disp: 8 g, Rfl: 2 .  ARNUITY ELLIPTA 200 MCG/ACT AEPB, INHALE 1 PUFF INTO THE LUNGS DAILY., Disp: 90 each, Rfl: 0 .  aspirin 81 MG chewable tablet, Chew 1 tablet (81 mg total) by mouth daily., Disp: , Rfl:  .  atorvastatin (LIPITOR) 40 MG tablet, Take 40 mg by mouth at bedtime., Disp: , Rfl:  .  budesonide-formoterol (SYMBICORT) 80-4.5 MCG/ACT inhaler, Inhale 2 puffs into the lungs 2 (two) times daily., Disp: 1 each, Rfl: 5 .  cetirizine (ZYRTEC ALLERGY) 10 MG tablet, Take 1 tablet (10 mg total) by mouth daily., Disp: 30 tablet, Rfl: 0 .  EPINEPHrine 0.3 mg/0.3 mL IJ SOAJ injection, Inject 0.3 mg into the muscle as needed for anaphylaxis., Disp: 1 each, Rfl: 1 .  fluticasone (FLONASE) 50 MCG/ACT nasal spray, USE 2 SPRAYS IN EACH NOSTRIL EVERY DAY, Disp: 48 g, Rfl: 1 .  guaiFENesin (MUCINEX) 600 MG 12 hr tablet, Take 1,200 mg by mouth 2 (two) times daily as needed for to loosen phlegm., Disp: , Rfl:  .  ibuprofen (ADVIL) 200 MG tablet, Take 200 mg by mouth every 6 (six) hours as needed., Disp: , Rfl:  .  levocetirizine (XYZAL) 5 MG tablet, Take 1 tablet (5  mg total) by mouth every evening. (Patient taking differently: Take 5 mg by mouth daily.), Disp: 90 tablet, Rfl: 3 .  Melatonin 10 MG TABS, Take 10 mg by mouth at bedtime., Disp: , Rfl:  .  metoprolol tartrate (LOPRESSOR) 25 MG tablet, Take 0.5 tablets (12.5 mg total) by mouth 2 (two) times daily., Disp: 90 tablet, Rfl: 3 .  montelukast (SINGULAIR) 10 MG tablet, Take 1 tablet (10 mg total) by mouth at bedtime., Disp: 30 tablet, Rfl: 5 .  Multiple Vitamins-Minerals (EMERGEN-C BLUE) PACK, Take 1 each by mouth daily., Disp: , Rfl:  .  nitroGLYCERIN (NITROSTAT) 0.4 MG SL tablet, Place 1 tablet (0.4 mg total) under the tongue every 5 (five) minutes as needed for chest pain., Disp: 25 tablet,  Rfl: 12 .  omeprazole (PRILOSEC) 20 MG capsule, Take 1 capsule (20 mg total) by mouth every evening., Disp: 90 capsule, Rfl: 3 .  predniSONE (DELTASONE) 20 MG tablet, Take 1 tablet (20 mg total) by mouth daily with breakfast for 5 days., Disp: 5 tablet, Rfl: 0 .  promethazine-dextromethorphan (PROMETHAZINE-DM) 6.25-15 MG/5ML syrup, Take 5 mLs by mouth 4 (four) times daily as needed for cough., Disp: 118 mL, Rfl: 0 .  traZODone (DESYREL) 50 MG tablet, Take 1 tablet (50 mg total) by mouth at bedtime as needed for sleep. (Patient taking differently: Take 50 mg by mouth at bedtime.), Disp: 90 tablet, Rfl: 2 .  valACYclovir (VALTREX) 1000 MG tablet, Take 1 tablet (1,000 mg total) by mouth 3 (three) times daily for 7 days., Disp: 21 tablet, Rfl: 0   Allergies  Allergen Reactions  . Bee Venom Shortness Of Breath  . Oxycodone Nausea And Vomiting  . Percocet [Oxycodone-Acetaminophen] Nausea And Vomiting   Past Medical History:  Diagnosis Date  . Allergic rhinitis   . Allergy   . 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  . Bilateral carpal tunnel syndrome 12/08/2018  . 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 NSTEMI 07/2013 - DES to mLCx, DES to prox OM1, DES to mLAD with nonobstructive RCA stenosis, EF 55-65%.  . Chronic kidney disease    kidney stones   . Dyslipidemia   . Family history of bone cancer   . GERD (gastroesophageal reflux disease)   . History of kidney stones   . History of non-ST elevation myocardial infarction (NSTEMI)    07/ 2015  S/P  DES X3  . History of palpitations   . Hyperlipidemia   . Hypertension   . Insomnia 12/08/2018   x many years  . Myocardial infarction ([place]) 2015   NSTEMI- 3 stents   . OSA (obstructive sleep apnea)    cpap intolerant  . Right knee meniscal tear   . S/P drug eluting coronary stent placement    07/ 2015  x3  to mLCFX, OM1, mLAD  . Sinus bradycardia    . Sleep apnea    no cpap     Past Surgical History:  Procedure Laterality Date  . CATARACT EXTRACTION, BILATERAL    . COLONOSCOPY     >10 yrs ago-polyps per pt- we have no report   . EXTRACORPOREAL SHOCK WAVE LITHOTRIPSY  x2 last one 2005  . KNEE ARTHROSCOPY Right 05/02/2015   Procedure: RIGHT KNEE ARTHROSCOPY WITH medial meniscal DEBRIDEMENT ;  Surgeon: Gaynelle Arabian, MD;  [facility];  Service: Orthopedics;  Laterality: Right;  .  LEFT HEART CATHETERIZATION WITH CORONARY ANGIOGRAM N/A 08/05/2013   Procedure: LEFT HEART CATHETERIZATION WITH CORONARY ANGIOGRAM;  Surgeon: Blane Ohara, MD;  Location: [facility] CATH LAB;  Service: Cardiovascular;  Laterality: N/A;  Promus DES's to  mLCFx,  OM1,  mLAD (total 3)/   mRCA 30%,  dLM  20-30%,  preserved LVSF, ef 55-65%  . LUMBAR SPINE SURGERY  x3  last one 1980's  . SHOULDER ARTHROSCOPY Right 2013  . TONSILLECTOMY      Social History   Socioeconomic History  . Marital status: Married    Spouse name: Joelene Millin  . Number of children: 1  . Years of education: 61  . Highest education level: Some college, no degree  Occupational History  . Occupation: [employer]    Comment: Retired - prior Oceanographer  Tobacco Use  . Smoking status: Former Smoker    Years: 1.00    Types: Cigarettes    Quit date: 04/27/1966    Years since quitting: 53.9  . Smokeless tobacco: Never Used  Vaping Use  . Vaping Use: Never used  Substance and Sexual Activity  . Alcohol use: Yes    Comment: occasional  . Drug use: No  . Sexual activity: Yes  Other Topics Concern  . Not on file  Social History Narrative  . Not on file   Social Determinants of Health   Financial Resource Strain: Not on file  Food Insecurity: Not on file  Transportation Needs: Not on file  Physical Activity: Not on file  Stress: Not on file  Social Connections: Not on file  Intimate Partner Violence: Not on file        Objective:    BP 129/72    Pulse 70   Temp 97.7 F (36.5 C) (Temporal)   Ht 5\' 10"  (1.778 m)   Wt 216 lb 12.8 oz (98.3 kg)   SpO2 94%   BMI 31.11 kg/m   Wt Readings from Last 3 Encounters:  03/16/20 216 lb 12.8 oz (98.3 kg)  02/28/20 210 lb (95.3 kg)  02/21/20 213 lb 13.5 oz (97 kg)    Physical Exam Vitals reviewed.  Constitutional:      General: He is not in acute distress.    Appearance: Normal appearance. He is obese. He is not ill-appearing, toxic-appearing or diaphoretic.  HENT:     Head: Normocephalic and atraumatic.  Eyes:     General: No scleral icterus.       Right eye: No discharge.        Left eye: No discharge.     Conjunctiva/sclera: Conjunctivae normal.  Cardiovascular:     Rate and Rhythm: Normal rate and regular rhythm.     Heart sounds: Normal heart sounds. No murmur heard. No friction rub. No gallop.   Pulmonary:     Effort: Pulmonary effort is normal. No respiratory distress.     Breath sounds: Normal breath sounds. No stridor. No wheezing, rhonchi or rales.  Musculoskeletal:        General: Normal range of motion.     Cervical back: Normal range of motion.  Skin:    General: Skin is warm and dry.  Neurological:     Mental Status: He is alert and oriented to person, place, and time. Mental status is at baseline.  Psychiatric:        Mood and Affect: Mood normal.        Behavior: Behavior normal.        Thought Content: Thought  content normal.        Judgment: Judgment normal.     Lab Results  Component Value Date   TSH 2.43 01/24/2020   Lab Results  Component Value Date   WBC 12.7 (H) 02/28/2020   HGB 14.9 02/28/2020   HCT 43.9 02/28/2020   MCV 87 02/28/2020   PLT 232 02/28/2020   Lab Results  Component Value Date   NA 138 02/28/2020   K 4.7 02/28/2020   CO2 20 02/28/2020   GLUCOSE 236 (H) 02/28/2020   BUN 17 02/28/2020   CREATININE 0.84 02/28/2020   BILITOT 0.2 02/28/2020   ALKPHOS 99 02/28/2020   AST 16 02/28/2020   ALT 52 (H) 02/28/2020   PROT 5.8  (L) 02/28/2020   ALBUMIN 3.6 (L) 02/28/2020   CALCIUM 8.6 02/28/2020   ANIONGAP 8 02/23/2020   Lab Results  Component Value Date   CHOL 135 06/03/2019   Lab Results  Component Value Date   HDL 45 06/03/2019   Lab Results  Component Value Date   LDLCALC 66 06/03/2019   Lab Results  Component Value Date   TRIG 137 06/03/2019   Lab Results  Component Value Date   CHOLHDL 3.0 06/03/2019   Lab Results  Component Value Date   HGBA1C 6.6 (H) 02/28/2020

## 2020-03-16 NOTE — Patient Instructions (Signed)
Diabetes Mellitus and Nutrition, Adult When you have diabetes, or diabetes mellitus, it is very important to have healthy eating habits because your blood sugar (glucose) levels are greatly affected by what you eat and drink. Eating healthy foods in the right amounts, at about the same times every day, can help you:  Control your blood glucose.  Lower your risk of heart disease.  Improve your blood pressure.  Reach or maintain a healthy weight. What can affect my meal plan? Every person with diabetes is different, and each person has different needs for a meal plan. Your health care provider may recommend that you work with a dietitian to make a meal plan that is best for you. Your meal plan may vary depending on factors such as:  The calories you need.  The medicines you take.  Your weight.  Your blood glucose, blood pressure, and cholesterol levels.  Your activity level.  Other health conditions you have, such as heart or kidney disease. How do carbohydrates affect me? Carbohydrates, also called carbs, affect your blood glucose level more than any other type of food. Eating carbs naturally raises the amount of glucose in your blood. Carb counting is a method for keeping track of how many carbs you eat. Counting carbs is important to keep your blood glucose at a healthy level, especially if you use insulin or take certain oral diabetes medicines. It is important to know how many carbs you can safely have in each meal. This is different for every person. Your dietitian can help you calculate how many carbs you should have at each meal and for each snack. How does alcohol affect me? Alcohol can cause a sudden decrease in blood glucose (hypoglycemia), especially if you use insulin or take certain oral diabetes medicines. Hypoglycemia can be a life-threatening condition. Symptoms of hypoglycemia, such as sleepiness, dizziness, and confusion, are similar to symptoms of having too much  alcohol.  Do not drink alcohol if: ? Your health care provider tells you not to drink. ? You are pregnant, may be pregnant, or are planning to become pregnant.  If you drink alcohol: ? Do not drink on an empty stomach. ? Limit how much you use to:  0-1 drink a day for women.  0-2 drinks a day for men. ? Be aware of how much alcohol is in your drink. In the [place] one drink equals one 12 oz bottle of beer (355 mL), one 5 oz glass of wine (148 mL), or one 1 oz glass of hard liquor (44 mL). ? Keep yourself hydrated with water, diet soda, or unsweetened iced tea.  Keep in mind that regular soda, juice, and other mixers may contain a lot of sugar and must be counted as carbs. What are tips for following this plan? Reading food labels  Start by checking the serving size on the "Nutrition Facts" label of packaged foods and drinks. The amount of calories, carbs, fats, and other nutrients listed on the label is based on one serving of the item. Many items contain more than one serving per package.  Check the total grams (g) of carbs in one serving. You can calculate the number of servings of carbs in one serving by dividing the total carbs by 15. For example, if a food has 30 g of total carbs per serving, it would be equal to 2 servings of carbs.  Check the number of grams (g) of saturated fats and trans fats in one serving. Choose foods that have   a low amount or none of these fats.  Check the number of milligrams (mg) of salt (sodium) in one serving. Most people should limit total sodium intake to less than 2,300 mg per day.  Always check the nutrition information of foods labeled as "low-fat" or "nonfat." These foods may be higher in added sugar or refined carbs and should be avoided.  Talk to your dietitian to identify your daily goals for nutrients listed on the label. Shopping  Avoid buying canned, pre-made, or processed foods. These foods tend to be high in fat, sodium, and added  sugar.  Shop around the outside edge of the grocery store. This is where you will most often find fresh fruits and vegetables, bulk grains, fresh meats, and fresh dairy. Cooking  Use low-heat cooking methods, such as baking, instead of high-heat cooking methods like deep frying.  Cook using healthy oils, such as olive, canola, or sunflower oil.  Avoid cooking with butter, cream, or high-fat meats. Meal planning  Eat meals and snacks regularly, preferably at the same times every day. Avoid going long periods of time without eating.  Eat foods that are high in fiber, such as fresh fruits, vegetables, beans, and whole grains. Talk with your dietitian about how many servings of carbs you can eat at each meal.  Eat 4-6 oz (112-168 g) of lean protein each day, such as lean meat, chicken, fish, eggs, or tofu. One ounce (oz) of lean protein is equal to: ? 1 oz (28 g) of meat, chicken, or fish. ? 1 egg. ?  cup (62 g) of tofu.  Eat some foods each day that contain healthy fats, such as avocado, nuts, seeds, and fish.   What foods should I eat? Fruits Berries. Apples. Oranges. Peaches. Apricots. Plums. Grapes. Mango. Papaya. Pomegranate. Kiwi. Cherries. Vegetables Lettuce. Spinach. Leafy greens, including kale, chard, collard greens, and mustard greens. Beets. Cauliflower. Cabbage. Broccoli. Carrots. Green beans. Tomatoes. Peppers. Onions. Cucumbers. Brussels sprouts. Grains Whole grains, such as whole-wheat or whole-grain bread, crackers, tortillas, cereal, and pasta. Unsweetened oatmeal. Quinoa. Brown or wild rice. Meats and other proteins Seafood. Poultry without skin. Lean cuts of poultry and beef. Tofu. Nuts. Seeds. Dairy Low-fat or fat-free dairy products such as milk, yogurt, and cheese. The items listed above may not be a complete list of foods and beverages you can eat. Contact a dietitian for more information. What foods should I avoid? Fruits Fruits canned with  syrup. Vegetables Canned vegetables. Frozen vegetables with butter or cream sauce. Grains Refined white flour and flour products such as bread, pasta, snack foods, and cereals. Avoid all processed foods. Meats and other proteins Fatty cuts of meat. Poultry with skin. Breaded or fried meats. Processed meat. Avoid saturated fats. Dairy Full-fat yogurt, cheese, or milk. Beverages Sweetened drinks, such as soda or iced tea. The items listed above may not be a complete list of foods and beverages you should avoid. Contact a dietitian for more information. Questions to ask a health care provider  Do I need to meet with a diabetes educator?  Do I need to meet with a dietitian?  What number can I call if I have questions?  When are the best times to check my blood glucose? Where to find more information:  American Diabetes Association: diabetes.org  Academy of Nutrition and Dietetics: www.eatright.org  National Institute of Diabetes and Digestive and Kidney Diseases: www.niddk.nih.gov  Association of Diabetes Care and Education Specialists: www.diabeteseducator.org Summary  It is important to have healthy eating   habits because your blood sugar (glucose) levels are greatly affected by what you eat and drink.  A healthy meal plan will help you control your blood glucose and maintain a healthy lifestyle.  Your health care provider may recommend that you work with a dietitian to make a meal plan that is best for you.  Keep in mind that carbohydrates (carbs) and alcohol have immediate effects on your blood glucose levels. It is important to count carbs and to use alcohol carefully. This information is not intended to replace advice given to you by your health care provider. Make sure you discuss any questions you have with your health care provider. Document Revised: 12/14/2018 Document Reviewed: 12/14/2018 Elsevier Patient Education  2021 [employer]  

## 2020-03-27 ENCOUNTER — Other Ambulatory Visit: Payer: Self-pay | Admitting: Family Medicine

## 2020-03-27 DIAGNOSIS — J4540 Moderate persistent asthma, uncomplicated: Secondary | ICD-10-CM

## 2020-04-02 ENCOUNTER — Ambulatory Visit (INDEPENDENT_AMBULATORY_CARE_PROVIDER_SITE_OTHER): Payer: Medicare HMO | Admitting: *Deleted

## 2020-04-02 DIAGNOSIS — Z0000 Encounter for general adult medical examination without abnormal findings: Secondary | ICD-10-CM

## 2020-04-02 NOTE — Patient Instructions (Signed)
  [facility] Maintenance Summary and Written Plan of Care  [name] ,  Thank you for allowing me to perform your Medicare Annual Wellness Visit and for your ongoing commitment to your health.   Health Maintenance & Immunization History Health Maintenance  Topic Date Due  . COVID-19 Vaccine (3 - Booster for Moderna series) 10/25/2019  . TETANUS/TDAP  10/25/2020 (Originally 08/19/1964)  . COLONOSCOPY (Pts 45-68yrs Insurance coverage will need to be confirmed)  02/09/2029  . INFLUENZA VACCINE  Completed  . Hepatitis C Screening  Completed  . PNA vac Low Risk Adult  Completed  . HPV VACCINES  Aged Out   Immunization History  Administered Date(s) Administered  . Fluad Quad(high Dose 65+) 12/03/2018, 10/26/2019  . Influenza Inj Mdck Quad Pf 12/03/2018  . Influenza,inj,Quad PF,6+ Mos 11/10/2016, 12/01/2017  . Influenza-Unspecified 11/27/2015  . Moderna Sars-Covid-2 Vaccination 03/28/2019, 04/25/2019  . Pneumococcal Conjugate-13 02/23/2015  . Pneumococcal Polysaccharide-23 08/06/2013    These are the patient goals that we discussed: Goals Addressed            This Visit's Progress   . AWV       04/02/2020 AWV Goal: Fall Prevention  . Over the next year, patient will decrease their risk for falls by: o Using assistive devices, such as a cane or walker, as needed o Identifying fall risks within their home and correcting them by: - Removing throw rugs - Adding handrails to stairs or ramps - Removing clutter and keeping a clear pathway throughout the home - Increasing light, especially at night - Adding shower handles/bars - Raising toilet seat o Identifying potential personal risk factors for falls: - Medication side effects - Incontinence/urgency - Vestibular dysfunction - Hearing loss - Musculoskeletal disorders - Neurological disorders - Orthostatic hypotension          This is a list of Health Maintenance Items that are overdue or due  now: Health Maintenance Due  Topic Date Due  . COVID-19 Vaccine (3 - Booster for Moderna series) 10/25/2019     Orders/Referrals Placed Today: No orders of the defined types were placed in this encounter.  (Contact our referral department at [phone] if you have not spoken with someone about your referral appointment within the next 5 days)    Follow-up Plan . Follow-up with Loman Brooklyn, FNP as planned . Schedule your yearly eye exam . Keep appointment for COVID vaccine Booster

## 2020-04-02 NOTE — Progress Notes (Addendum)
MEDICARE ANNUAL WELLNESS VISIT  04/02/2020  Telephone Visit Disclaimer This Medicare AWV was conducted by telephone due to national recommendations for restrictions regarding the COVID-19 Pandemic (e.g. social distancing).  I verified, using two identifiers, that I am speaking with Ronald Berger or their authorized healthcare agent. I discussed the limitations, risks, security, and privacy concerns of performing an evaluation and management service by telephone and the potential availability of an in-person appointment in the future. The patient expressed understanding and agreed to proceed.  Location of Patient: Home Location of Provider (nurse):  [facility]  Subjective:    Ronald Berger is a 75 y.o. male patient of Loman Brooklyn, FNP who had a Medicare Annual Wellness Visit today via telephone. Ronald Berger is Working full time and lives with his wife and son. He has 1 son.  He reports that he is socially active and does interact with friends/family regularly. he is minimally physically active and enjoys fishing and hunting.  Patient Care Team: Loman Brooklyn, FNP as PCP - General (Family Medicine) Pixie Casino, MD as PCP - Cardiology (Cardiology)  Advanced Directives 04/02/2020 02/21/2020 04/01/2019 04/01/2019 05/02/2015 02/14/2014 02/14/2014  Does Patient Have a Medical Advance Directive? Yes No Yes No No No No  Type of Advance Directive Living will - [place];Living will - - - -  Does patient want to make changes to medical advance directive? No - Patient declined - No - Patient declined - - - -  Copy of Press photographer in Chart? - - No - copy requested - - - -  Would patient like information on creating a medical advance directive? - No - Patient declined No - Patient declined Yes (MAU/Ambulatory/Procedural Areas - Information given) Yes - Educational materials given No - patient declined information -  Pre-existing out  of facility DNR order (yellow form or pink MOST form) - - - - - - -    Hospital Utilization Over the Past 12 Months: # of hospitalizations or ER visits: 1 # of surgeries: 0  Review of Systems    Patient reports that his overall health is unchanged compared to last year. Except from recovering from COVID pneumonia  History obtained from chart review and the patient Respiratory ROS: positive for - cough  Patient Reported Readings (BP, Pulse, CBG, Weight, etc) none  Pain Assessment Pain : No/denies pain     Current Medications & Allergies (verified) Allergies as of 04/02/2020      Reactions   Bee Venom Shortness Of Breath   Oxycodone Nausea And Vomiting   Percocet [oxycodone-acetaminophen] Nausea And Vomiting      Medication List       Accurate as of April 02, 2020 11:07 AM. If you have any questions, ask your nurse or doctor.        STOP taking these medications   levocetirizine 5 MG tablet Commonly known as: XYZAL     TAKE these medications   albuterol 108 (90 Base) MCG/ACT inhaler Commonly known as: VENTOLIN HFA INHALE 2 PUFFS INTO THE LUNGS EVERY 6 HOURS AS NEEDED FOR WHEEZING OR SHORTNESS OF BREATH.   Arnuity Ellipta 200 MCG/ACT Aepb Generic drug: Fluticasone Furoate INHALE 1 PUFF INTO THE LUNGS DAILY.   aspirin 81 MG chewable tablet Chew 1 tablet (81 mg total) by mouth daily.   atorvastatin 40 MG tablet Commonly known as: LIPITOR Take 40 mg by mouth at bedtime.   budesonide-formoterol 80-4.5 MCG/ACT  inhaler Commonly known as: Symbicort Inhale 2 puffs into the lungs 2 (two) times daily.   cetirizine 10 MG tablet Commonly known as: ZyrTEC Allergy Take 1 tablet (10 mg total) by mouth daily.   Emergen-C Blue Pack Take 1 each by mouth daily.   EPINEPHrine 0.3 mg/0.3 mL Soaj injection Commonly known as: EPI-PEN Inject 0.3 mg into the muscle as needed for anaphylaxis.   fluticasone 50 MCG/ACT nasal spray Commonly known as: FLONASE USE 2 SPRAYS IN  EACH NOSTRIL EVERY DAY   guaiFENesin 600 MG 12 hr tablet Commonly known as: MUCINEX Take 1,200 mg by mouth 2 (two) times daily as needed for to loosen phlegm.   ibuprofen 200 MG tablet Commonly known as: ADVIL Take 200 mg by mouth every 6 (six) hours as needed.   Melatonin 10 MG Tabs Take 10 mg by mouth at bedtime.   metoprolol tartrate 25 MG tablet Commonly known as: LOPRESSOR Take 0.5 tablets (12.5 mg total) by mouth 2 (two) times daily.   montelukast 10 MG tablet Commonly known as: SINGULAIR Take 1 tablet (10 mg total) by mouth at bedtime.   nitroGLYCERIN 0.4 MG SL tablet Commonly known as: NITROSTAT Place 1 tablet (0.4 mg total) under the tongue every 5 (five) minutes as needed for chest pain.   omeprazole 20 MG capsule Commonly known as: PRILOSEC Take 1 capsule (20 mg total) by mouth every evening.   promethazine-dextromethorphan 6.25-15 MG/5ML syrup Commonly known as: PROMETHAZINE-DM Take 5 mLs by mouth 4 (four) times daily as needed for cough.   traZODone 50 MG tablet Commonly known as: DESYREL Take 1 tablet (50 mg total) by mouth at bedtime as needed for sleep. What changed: when to take this       History (reviewed): Past Medical History:  Diagnosis Date  . Allergic rhinitis   . Allergy   . 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  . Bilateral carpal tunnel syndrome 12/08/2018  . 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 NSTEMI 07/2013 - DES to mLCx, DES to prox OM1, DES to mLAD with nonobstructive RCA stenosis, EF 55-65%.  . Chronic kidney disease    kidney stones   . Dyslipidemia   . Family history of bone cancer   . GERD (gastroesophageal reflux disease)   . History of kidney stones   . History of non-ST elevation myocardial infarction (NSTEMI)    07/ 2015  S/P  DES X3  . History of palpitations   . Hyperlipidemia   . Hypertension   . Insomnia 12/08/2018    x many years  . Myocardial infarction ([place]) 2015   NSTEMI- 3 stents   . OSA (obstructive sleep apnea)    cpap intolerant  . Pneumonia due to COVID-19 virus   . Right knee meniscal tear   . S/P drug eluting coronary stent placement    07/ 2015  x3  to mLCFX, OM1, mLAD  . Sinus bradycardia   . Sleep apnea    no cpap    Past Surgical History:  Procedure Laterality Date  . CATARACT EXTRACTION, BILATERAL    . COLONOSCOPY     >10 yrs ago-polyps per pt- we have no report   . EXTRACORPOREAL SHOCK WAVE LITHOTRIPSY  x2 last one 2005  . KNEE ARTHROSCOPY Right 05/02/2015   Procedure: RIGHT KNEE ARTHROSCOPY WITH medial meniscal DEBRIDEMENT ;  Surgeon: Gaynelle Arabian, MD;  Location: [facility];  Service: Orthopedics;  Laterality: Right;  . LEFT HEART CATHETERIZATION WITH CORONARY ANGIOGRAM N/A 08/05/2013   Procedure: LEFT HEART CATHETERIZATION WITH CORONARY ANGIOGRAM;  Surgeon: Blane Ohara, MD;  Location: [facility] CATH LAB;  Service: Cardiovascular;  Laterality: N/A;  Promus DES's to  mLCFx,  OM1,  mLAD (total 3)/   mRCA 30%,  dLM  20-30%,  preserved LVSF, ef 55-65%  . LUMBAR SPINE SURGERY  x3  last one 1980's  . SHOULDER ARTHROSCOPY Right 2013  . TONSILLECTOMY     Family History  Problem Relation Age of Onset  . Coronary artery disease Mother        CABG  . CAD Sister   . Bone cancer Sister   . Alzheimer's disease Father   . Aneurysm Brother        brain  . Heart failure Sister   . Bone cancer Sister   . Colon cancer Neg Hx   . Colon polyps Neg Hx   . Esophageal cancer Neg Hx   . Rectal cancer Neg Hx   . Stomach cancer Neg Hx    Social History   Socioeconomic History  . Marital status: Married    Spouse name: Joelene Millin  . Number of children: 1  . Years of education: 47  . Highest education level: Some college, no degree  Occupational History  . Occupation: [employer]    Comment: Retired - prior Oceanographer  Tobacco Use  . Smoking status: Former  Smoker    Years: 1.00    Types: Cigarettes    Quit date: 04/27/1966    Years since quitting: 53.9  . Smokeless tobacco: Never Used  Vaping Use  . Vaping Use: Never used  Substance and Sexual Activity  . Alcohol use: Yes    Comment: occasional  . Drug use: No  . Sexual activity: Yes  Other Topics Concern  . Not on file  Social History Narrative  . Not on file   Social Determinants of Health   Financial Resource Strain: Not on file  Food Insecurity: Not on file  Transportation Needs: Not on file  Physical Activity: Not on file  Stress: Not on file  Social Connections: Not on file    Activities of Daily Living In your present state of health, do you have any difficulty performing the following activities: 04/02/2020 02/21/2020  Hearing? N N  Vision? Y N  Comment Wears reading glasses -  Difficulty concentrating or making decisions? N N  Walking or climbing stairs? N N  Dressing or bathing? N N  Doing errands, shopping? N N  Preparing Food and eating ? N -  Using the Toilet? N -  In the past six months, have you accidently leaked urine? N -  Do you have problems with loss of bowel control? N -  Managing your Medications? N -  Managing your Finances? N -  Housekeeping or managing your Housekeeping? N -  Some recent data might be hidden    Patient Education/ Literacy How often do you need to have someone help you when you read instructions, pamphlets, or other written materials from your doctor or pharmacy?: 1 - Never What is the last grade level you completed in school?: Technical school  Exercise Current Exercise Habits: The patient has a physically strenuous job, but has no regular exercise apart from work.  Diet Patient reports consuming 3 meals a day and 1 snack(s) a day Patient reports that his  primary diet is: Regular Patient reports that she does have regular access to food.   Depression Screen PHQ 2/9 Scores 04/02/2020 10/26/2019 06/02/2019 04/01/2019 12/03/2018  11/17/2013  PHQ - 2 Score 0 0 0 0 0 0  PHQ- 9 Score - - - - 0 -     Fall Risk Fall Risk  04/02/2020 10/26/2019 06/02/2019 04/01/2019 12/03/2018  Falls in the past year? 0 0 0 0 0  Number falls in past yr: - - - 0 -  Injury with Fall? - - - 0 -  Risk for fall due to : - - - No Fall Risks -  Follow up - Falls evaluation completed - Falls evaluation completed -     Objective:  Ronald Berger seemed alert and oriented and he participated appropriately during our telephone visit.  Blood Pressure Weight BMI  BP Readings from Last 3 Encounters:  03/16/20 129/72  03/11/20 (!) 159/65  02/28/20 136/76   Wt Readings from Last 3 Encounters:  03/16/20 216 lb 12.8 oz (98.3 kg)  02/28/20 210 lb (95.3 kg)  02/21/20 213 lb 13.5 oz (97 kg)   BMI Readings from Last 1 Encounters:  03/16/20 31.11 kg/m    *Unable to obtain current vital signs, weight, and BMI due to telephone visit type  Hearing/Vision  . Rudie did not seem to have difficulty with hearing/understanding during the telephone conversation . Reports that he has had a formal eye exam by an eye care professional within the past year . Reports that he has not had a formal hearing evaluation within the past year *Unable to fully assess hearing and vision during telephone visit type  Cognitive Function: 6CIT Screen 04/02/2020 04/01/2019  What Year? 0 points 0 points  What month? 0 points 0 points  What time? 0 points 0 points  Count back from 20 0 points 0 points  Months in reverse 0 points 0 points  Repeat phrase 0 points 0 points  Total Score 0 0   (Normal:0-7, Significant for Dysfunction: >8)  Normal Cognitive Function Screening: Yes   Immunization & Health Maintenance Record Immunization History  Administered Date(s) Administered  . Fluad Quad(high Dose 65+) 12/03/2018, 10/26/2019  . Influenza Inj Mdck Quad Pf 12/03/2018  . Influenza,inj,Quad PF,6+ Mos 11/10/2016, 12/01/2017  . Influenza-Unspecified 11/27/2015  .  Moderna Sars-Covid-2 Vaccination 03/28/2019, 04/25/2019  . Pneumococcal Conjugate-13 02/23/2015  . Pneumococcal Polysaccharide-23 08/06/2013    Health Maintenance  Topic Date Due  . COVID-19 Vaccine (3 - Booster for Moderna series) 10/25/2019  . TETANUS/TDAP  10/25/2020 (Originally 08/19/1964)  . COLONOSCOPY (Pts 45-55yrs Insurance coverage will need to be confirmed)  02/09/2029  . INFLUENZA VACCINE  Completed  . Hepatitis C Screening  Completed  . PNA vac Low Risk Adult  Completed  . HPV VACCINES  Aged Out       Assessment  This is a routine wellness examination for Ronald Berger.  Health Maintenance: Due or Overdue Health Maintenance Due  Topic Date Due  . COVID-19 Vaccine (3 - Booster for Moderna series) 10/25/2019    Ronald Berger does not need a referral for Community Assistance: Care Management:   no Social Work:    no Prescription Assistance:  no Nutrition/Diabetes Education:  no   Plan:  Personalized Goals Goals Addressed            This Visit's Progress   . AWV       04/02/2020 AWV Goal: Fall Prevention  . Over the next year,  patient will decrease their risk for falls by: o Using assistive devices, such as a cane or walker, as needed o Identifying fall risks within their home and correcting them by: - Removing throw rugs - Adding handrails to stairs or ramps - Removing clutter and keeping a clear pathway throughout the home - Increasing light, especially at night - Adding shower handles/bars - Raising toilet seat o Identifying potential personal risk factors for falls: - Medication side effects - Incontinence/urgency - Vestibular dysfunction - Hearing loss - Musculoskeletal disorders - Neurological disorders - Orthostatic hypotension        Personalized Health Maintenance & Screening Recommendations  COVID Vaccine Booster   Lung Cancer Screening Recommended: no (Low Dose CT Chest recommended if Age 57-80 years, 30 pack-year currently  smoking OR have quit w/in past 15 years) Hepatitis C Screening recommended: no HIV Screening recommended: no  Advanced Directives: Written information was not prepared per patient's request.  Referrals & Orders No orders of the defined types were placed in this encounter.   Follow-up Plan . Follow-up with Loman Brooklyn, FNP as planned . Schedule your yearly eye exam . Keep appointment for COVID vaccine Booster. . AVS printed and mailed to patient    I have personally reviewed and noted the following in the patient's chart:   . Medical and social history . Use of alcohol, tobacco or illicit drugs  . Current medications and supplements . Functional ability and status . Nutritional status . Physical activity . Advanced directives . List of other physicians . Hospitalizations, surgeries, and ER visits in previous 12 months . Vitals . Screenings to include cognitive, depression, and falls . Referrals and appointments  In addition, I have reviewed and discussed with Ronald Berger certain preventive protocols, quality metrics, and best practice recommendations. A written personalized care plan for preventive services as well as general preventive health recommendations is available and can be mailed to the patient at his request.     Lynnea Ferrier, LPN  [id number]/[id number]   I have read nurse Rossana Molchan's note and agree with her assessment and plan.  Patient to continue following with PCP for ongoing chronic medical problems.  Ashly M. Lajuana Ripple, [facility]

## 2020-04-05 ENCOUNTER — Encounter: Payer: Self-pay | Admitting: Internal Medicine

## 2020-04-05 ENCOUNTER — Ambulatory Visit (INDEPENDENT_AMBULATORY_CARE_PROVIDER_SITE_OTHER): Payer: Medicare HMO | Admitting: Internal Medicine

## 2020-04-05 ENCOUNTER — Other Ambulatory Visit: Payer: Self-pay

## 2020-04-05 VITALS — BP 115/65 | HR 59 | Ht 69.0 in | Wt 208.0 lb

## 2020-04-05 DIAGNOSIS — E782 Mixed hyperlipidemia: Secondary | ICD-10-CM

## 2020-04-05 DIAGNOSIS — U071 COVID-19: Secondary | ICD-10-CM | POA: Diagnosis not present

## 2020-04-05 DIAGNOSIS — I2510 Atherosclerotic heart disease of native coronary artery without angina pectoris: Secondary | ICD-10-CM

## 2020-04-05 DIAGNOSIS — R0600 Dyspnea, unspecified: Secondary | ICD-10-CM | POA: Diagnosis not present

## 2020-04-05 NOTE — Progress Notes (Signed)
Marland Kitchen    OFFICE NOTE  Chief Complaint:  No complaints  Primary Care Physician: Loman Brooklyn, FNP  HPI:  Ronald Berger is a 75 y/o M with history of CAD (NSTEMI 07/2013 s/p DES to mLCx/OM1/mLAD with nonobstructive RCA disease, normal EF), HLD, and GERD. He has done well since his PCI. He has been participating regularly in cardiac rehab without any recent exertional symptoms. He has been compliant with medication. He was recently seen in the ER for sharp chest discomfort slightly to the left of his sternum. It does not radiate. He denies any associated SOB, nausea, vomiting, diaphoresis, palpitations, near-syncope, syncope or bleeding. The pain is not similar to his prior angina (that was more pressure-like and with associated sx). It is worse with palpation. It is not usually worse with exertion but has been on a few occasions. Pain is not worse with inspiration or recumbency. He took Tums and several SL NTG without any change in discomfort. The discomfort has tended to come on in the morning, last all day long, then resolve at night when lying down. He walks his dog 3-4 times a day and wonders if walking her in the snow (which was more difficult) could've led to some shoulder straining. No recent travel, surgery, bedrest, LEE, or orthopnea. His wife is a Marine scientist and urged him to get checked out. In the ED, VSS except mild sinus bradycardia, not tachycardic, tachypneic or hypoxic. Normal CBC, BMET, troponin x 1. CXR no edema or consolidation. He was admitted for observation and treated with IV ketorolac with improvement in symptoms. He remained CP free. Troponins remained negative x 5 and labs remained stable. TSH 4.2. He did have some one episode of sinus bradycardia overnight in the low 40's while sleeping (asymptomatic). He does admit to a h/o OSA but has not been able to tolerate CPAP. He denies any dizziness, near-syncope, syncope or any symptoms of bradycardia. LDL remains at goal at 40.  [name] returns today for follow-up. He denies any chest pain or worsening shortness of breath. He is resume cardiac rehabilitation and doing well. He will need to continue on Prasugrel at least until this summer.  I saw Ronald Berger in the office today. Recently he called in as he woke up one morning and was noted to have a low heart rate and some palpitations. Apparently his wife listened to him who is a Marine scientist and thought he may have A. fib. Ultimately came in for Berger EKG which was normal. He has not had any reoccurrence. We contemplated a monitor however since he's not had recurrence at this point I don't think there is evidence to monitor him as he has another episode. Were also seeing him today as it's been 1 year since his prior stent. I believe at this point he could come off of Effient. He is not having any chest pain and is physically active.  08/29/2015  Ronald Berger was seen today in the office in follow-up. Over the past year he denies any cardiac complaints such as chest pain or worsening shortness of breath. Unfortunately recent knee injury and underwent the surgery which was arthroscopic in April. He still recovering from that. Ronald Berger amount of weight gain. Despite this recent cholesterol profile in June showed total cholesterol 111, triglycerides 84, HDL 53 and LDL 41. This represents good control. Heart rate remains slow around 50 on low-dose metoprolol, but he is asymptomatic with this. He does not appreciate any significant palpitations although  they are picked up by his wife is a Marine scientist.  08/28/2016  Ronald Berger returns today for follow-up. Is been exactly one year since I last saw him. He denies any chest pain or worsening shortness of breath. 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 Cholesterol is very well controlled. Blood pressure is at goal. He denies any recurrent chest pain.  01/31/2019  Ronald Berger is seen today in follow-up.  He is doing well  denies any chest pain or worsening shortness of breath.  He does report some mild increase in fatigue but he says this has been slowly developing.  He also thinks it may have been because he took 5 weeks off of work and is now getting back [place] park.  He is pending eye surgery and also is having elective colonoscopy in the near future.  I have no hesitation for him to come off his aspirin if necessary.  Labs a month ago showed total cholesterol 134, triglycerides 184, HDL 50 and LDL 54, however this was nonfasting.  04/05/2020  Ronald Berger is seen today in follow-up.  Overall he is doing very well.  He is now getting back to more full-time work at the [employer].  Unfortunately had COVID-19 after being vaccinated boosted in January of this year.  Since then he has had issues with asthma and difficulty breathing.  He was quite hypoxic required hospitalization and numerous medications including probably steroids and remdesivir.  His blood sugars were driven up by this and A1c recently was 6.6 but is not yet on therapy.  EKG today shows a sinus bradycardia.  He denies any anginal symptoms.  PMHx:  Past Medical History:  Diagnosis Date  . Allergic rhinitis   . Allergy   . 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  . Bilateral carpal tunnel syndrome 12/08/2018  . CAD, multiple vessel CARDIOLOGIST-  DR Ashrith Sagan   a. NSTEMI 07/2013 - DES to mLCx, DES to prox OM1, DES to mLAD with nonobstructive RCA stenosis, EF 55-65%.  . Chronic kidney disease    kidney stones   . Dyslipidemia   . Family history of bone cancer   . GERD (gastroesophageal reflux disease)   . History of kidney stones   . History of non-ST elevation myocardial infarction (NSTEMI)    07/ 2015  S/P  DES X3  . History of palpitations   . Hyperlipidemia   . Hypertension   . Insomnia 12/08/2018   x many years  . Myocardial infarction ([place]) 2015   NSTEMI-  3 stents   . OSA (obstructive sleep apnea)    cpap intolerant  . Pneumonia due to COVID-19 virus   . Right knee meniscal tear   . S/P drug eluting coronary stent placement    07/ 2015  x3  to mLCFX, OM1, mLAD  . Sinus bradycardia   . Sleep apnea    no cpap     Past Surgical History:  Procedure Laterality Date  . CATARACT EXTRACTION, BILATERAL    . COLONOSCOPY     >10 yrs ago-polyps per pt- we have no report   . EXTRACORPOREAL SHOCK WAVE LITHOTRIPSY  x2 last one 2005  . KNEE ARTHROSCOPY Right 05/02/2015   Procedure: RIGHT KNEE ARTHROSCOPY WITH medial meniscal DEBRIDEMENT ;  Surgeon: Gaynelle Arabian, MD;  Location: [facility];  Service: Orthopedics;  Laterality:  Right;  Marland Kitchen LEFT HEART CATHETERIZATION WITH CORONARY ANGIOGRAM N/A 08/05/2013   Procedure: LEFT HEART CATHETERIZATION WITH CORONARY ANGIOGRAM;  Surgeon: Blane Ohara, MD;  Location: [facility] CATH LAB;  Service: Cardiovascular;  Laterality: N/A;  Promus DES's to  mLCFx,  OM1,  mLAD (total 3)/   mRCA 30%,  dLM  20-30%,  preserved LVSF, ef 55-65%  . LUMBAR SPINE SURGERY  x3  last one 1980's  . SHOULDER ARTHROSCOPY Right 2013  . TONSILLECTOMY      FAMHx:  Family History  Problem Relation Age of Onset  . Coronary artery disease Mother        CABG  . CAD Sister   . Bone cancer Sister   . Alzheimer's disease Father   . Aneurysm Brother        brain  . Heart failure Sister   . Bone cancer Sister   . Colon cancer Neg Hx   . Colon polyps Neg Hx   . Esophageal cancer Neg Hx   . Rectal cancer Neg Hx   . Stomach cancer Neg Hx     SOCHx:   reports that he quit smoking about 53 years ago. His smoking use included cigarettes. He quit after 1.00 year of use. He has never used smokeless tobacco. He reports current alcohol use. He reports that he does not use drugs.  ALLERGIES:  Allergies  Allergen Reactions  . Bee Venom Shortness Of Breath  . Oxycodone Nausea And Vomiting  . Percocet [Oxycodone-Acetaminophen] Nausea  And Vomiting    ROS: Pertinent items noted in HPI and remainder of comprehensive ROS otherwise negative.  HOME MEDS: Current Outpatient Medications  Medication Sig Dispense Refill  . albuterol (VENTOLIN HFA) 108 (90 Base) MCG/ACT inhaler INHALE 2 PUFFS INTO THE LUNGS EVERY 6 HOURS AS NEEDED FOR WHEEZING OR SHORTNESS OF BREATH. 8 g 0  . ARNUITY ELLIPTA 200 MCG/ACT AEPB INHALE 1 PUFF INTO THE LUNGS DAILY. 90 each 0  . aspirin 81 MG chewable tablet Chew 1 tablet (81 mg total) by mouth daily.    Marland Kitchen atorvastatin (LIPITOR) 40 MG tablet Take 40 mg by mouth at bedtime.    . budesonide-formoterol (SYMBICORT) 80-4.5 MCG/ACT inhaler Inhale 2 puffs into the lungs 2 (two) times daily. 1 each 5  . cetirizine (ZYRTEC ALLERGY) 10 MG tablet Take 1 tablet (10 mg total) by mouth daily. 30 tablet 0  . EPINEPHrine 0.3 mg/0.3 mL IJ SOAJ injection Inject 0.3 mg into the muscle as needed for anaphylaxis. 1 each 1  . fluticasone (FLONASE) 50 MCG/ACT nasal spray USE 2 SPRAYS IN EACH NOSTRIL EVERY DAY 48 g 1  . guaiFENesin (MUCINEX) 600 MG 12 hr tablet Take 1,200 mg by mouth 2 (two) times daily as needed for to loosen phlegm.    Marland Kitchen ibuprofen (ADVIL) 200 MG tablet Take 200 mg by mouth every 6 (six) hours as needed.    . Melatonin 10 MG TABS Take 10 mg by mouth at bedtime.    . metoprolol tartrate (LOPRESSOR) 25 MG tablet Take 0.5 tablets (12.5 mg total) by mouth 2 (two) times daily. 90 tablet 3  . montelukast (SINGULAIR) 10 MG tablet Take 1 tablet (10 mg total) by mouth at bedtime. 30 tablet 5  . Multiple Vitamins-Minerals (EMERGEN-C BLUE) PACK Take 1 each by mouth daily.    . nitroGLYCERIN (NITROSTAT) 0.4 MG SL tablet Place 1 tablet (0.4 mg total) under the tongue every 5 (five) minutes as needed for chest pain. 25 tablet 12  .  omeprazole (PRILOSEC) 20 MG capsule Take 1 capsule (20 mg total) by mouth every evening. 90 capsule 3  . promethazine-dextromethorphan (PROMETHAZINE-DM) 6.25-15 MG/5ML syrup Take 5 mLs by mouth 4  (four) times daily as needed for cough. 118 mL 0  . traZODone (DESYREL) 50 MG tablet Take 1 tablet (50 mg total) by mouth at bedtime as needed for sleep. (Patient taking differently: Take 50 mg by mouth at bedtime.) 90 tablet 2   No current facility-administered medications for this visit.    LABS/IMAGING: No results found for this or any previous visit (from the past 48 hour(s)). No results found.  VITALS: BP 115/65   Pulse (!) 59   Ht 5\' 9"  (1.753 m)   Wt 208 lb (94.3 kg)   SpO2 98%   BMI 30.72 kg/m   EXAM: General appearance: alert and no distress Neck: no carotid bruit and no JVD Lungs: clear to auscultation bilaterally Heart: regular rate and rhythm, S1, S2 normal, no murmur, click, rub or gallop Abdomen: soft, non-tender; bowel sounds normal; no masses,  no organomegaly Extremities: extremities normal, atraumatic, no cyanosis or edema Pulses: 2+ and symmetric Skin: Skin color, texture, turgor normal. No rashes or lesions Neurologic: Grossly normal Psych: Pleasant  EKG: Sinus bradycardia 59-personally reviewed  ASSESSMENT: 1. Coronary artery disease with NSTEMI (PCI to mLCX, OM1 and mLAD-07/2013) 2. Dyslipidemia 3. GERD 4. Palpitations - rare  PLAN: 1.   Ronald Berger denies any chest pain but has been short of breath since his recent COVID-19 infection.  He has been on inhalers for this and is really had asthmatic-like symptoms.  He is going to start doing some more work and hopefully will improve with more exercise and being outdoors.  Cholesterols been controlled and will be reassessed in May at his physical.  He denies any palpitations.  Blood pressure is normal.  Follow-up with me annually or sooner as necessary  Pixie Casino, MD, [facility], [place] Director of the [facility]  Cardiovascular [facility] Reduction [facility] Diplomate of the American Board of Clinical Lipidology Attending Cardiologist  Direct Dial:  [phone]  Fax: [phone]  Website:  [email or web address] 04/05/2020, 9:20 AM

## 2020-04-05 NOTE — Patient Instructions (Signed)

## 2020-04-16 ENCOUNTER — Other Ambulatory Visit: Payer: Self-pay | Admitting: Family Medicine

## 2020-04-16 DIAGNOSIS — J4540 Moderate persistent asthma, uncomplicated: Secondary | ICD-10-CM

## 2020-04-19 ENCOUNTER — Ambulatory Visit (HOSPITAL_BASED_OUTPATIENT_CLINIC_OR_DEPARTMENT_OTHER): Payer: Medicare HMO | Admitting: Family Medicine

## 2020-05-11 ENCOUNTER — Other Ambulatory Visit: Payer: Self-pay | Admitting: Family Medicine

## 2020-05-11 ENCOUNTER — Encounter: Payer: Self-pay | Admitting: Family Medicine

## 2020-05-11 ENCOUNTER — Ambulatory Visit (INDEPENDENT_AMBULATORY_CARE_PROVIDER_SITE_OTHER): Payer: Medicare HMO | Admitting: Family Medicine

## 2020-05-11 VITALS — BP 124/73 | HR 70 | Temp 97.9°F

## 2020-05-11 DIAGNOSIS — B370 Candidal stomatitis: Secondary | ICD-10-CM | POA: Diagnosis not present

## 2020-05-11 DIAGNOSIS — J301 Allergic rhinitis due to pollen: Secondary | ICD-10-CM

## 2020-05-11 DIAGNOSIS — J4540 Moderate persistent asthma, uncomplicated: Secondary | ICD-10-CM

## 2020-05-11 DIAGNOSIS — J4541 Moderate persistent asthma with (acute) exacerbation: Secondary | ICD-10-CM

## 2020-05-11 MED ORDER — METHYLPREDNISOLONE ACETATE 80 MG/ML IJ SUSP
80.0000 mg | Freq: Once | INTRAMUSCULAR | Status: AC
  Administered 2020-05-11: 80 mg via INTRAMUSCULAR

## 2020-05-11 MED ORDER — AMOXICILLIN-POT CLAVULANATE 875-125 MG PO TABS: 1.0000 | ORAL_TABLET | Freq: Two times a day (BID) | ORAL | 0 refills | Status: AC

## 2020-05-11 MED ORDER — FLUTICASONE-SALMETEROL 250-50 MCG/DOSE IN AEPB: 1.0000 | INHALATION_SPRAY | Freq: Two times a day (BID) | RESPIRATORY_TRACT | 3 refills | Status: DC

## 2020-05-11 MED ORDER — MONTELUKAST SODIUM 10 MG PO TABS
10.0000 mg | ORAL_TABLET | Freq: Every day | ORAL | 5 refills | Status: DC
Start: 2020-05-11 — End: 2021-01-07

## 2020-05-11 MED ORDER — NYSTATIN 100000 UNIT/ML MT SUSP: 5.0000 mL | Freq: Four times a day (QID) | OROMUCOSAL | 0 refills | Status: DC

## 2020-05-11 MED ORDER — PREDNISONE 20 MG PO TABS: 20.0000 mg | ORAL_TABLET | Freq: Every day | ORAL | 0 refills | Status: AC

## 2020-05-11 NOTE — Progress Notes (Signed)
Acute Office Visit  Subjective:    Patient ID: Ronald Berger, male    DOB: [birth year], 75 y.o.   MRN: [id number]  Chief Complaint  Patient presents with  . Wheezing    HPI Patient is in today for cough, congestion, and wheezing x 1 week. His cough is productive with yellow sputum. He has been using his albuterol inhaler 5-6x a day this week, was previously using it 1-2x a day. He also uses arnuity and symbicort daily. Denies fever. Negative home Covid test. Had Covid in January followed by covid pneumonia. Also report fatigue. He has seasonal allergies and work outside. The spring is typically a hard time for him. He takes zyrtec, flonase, and singular daily. Denies chest pain.    Past Medical History:  Diagnosis Date  . Allergic rhinitis   . Allergy   . 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  . Bilateral carpal tunnel syndrome 12/08/2018  . 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 NSTEMI 07/2013 - DES to mLCx, DES to prox OM1, DES to mLAD with nonobstructive RCA stenosis, EF 55-65%.  . Chronic kidney disease    kidney stones   . Dyslipidemia   . Family history of bone cancer   . GERD (gastroesophageal reflux disease)   . History of kidney stones   . History of non-ST elevation myocardial infarction (NSTEMI)    07/ 2015  S/P  DES X3  . History of palpitations   . Hyperlipidemia   . Hypertension   . Insomnia 12/08/2018   x many years  . Myocardial infarction ([place]) 2015   NSTEMI- 3 stents   . OSA (obstructive sleep apnea)    cpap intolerant  . Pneumonia due to COVID-19 virus   . Right knee meniscal tear   . S/P drug eluting coronary stent placement    07/ 2015  x3  to mLCFX, OM1, mLAD  . Sinus bradycardia   . Sleep apnea    no cpap     Past Surgical History:  Procedure Laterality Date  . CATARACT EXTRACTION, BILATERAL    . COLONOSCOPY     >10 yrs ago-polyps per pt- we  have no report   . EXTRACORPOREAL SHOCK WAVE LITHOTRIPSY  x2 last one 2005  . KNEE ARTHROSCOPY Right 05/02/2015   Procedure: RIGHT KNEE ARTHROSCOPY WITH medial meniscal DEBRIDEMENT ;  Surgeon: Gaynelle Arabian, MD;  Location: [facility];  Service: Orthopedics;  Laterality: Right;  . LEFT HEART CATHETERIZATION WITH CORONARY ANGIOGRAM N/A 08/05/2013   Procedure: LEFT HEART CATHETERIZATION WITH CORONARY ANGIOGRAM;  Surgeon: Blane Ohara, MD;  Location: [facility] CATH LAB;  Service: Cardiovascular;  Laterality: N/A;  Promus DES's to  mLCFx,  OM1,  mLAD (total 3)/   mRCA 30%,  dLM  20-30%,  preserved LVSF, ef 55-65%  . LUMBAR SPINE SURGERY  x3  last one 1980's  . SHOULDER ARTHROSCOPY Right 2013  . TONSILLECTOMY      Family History  Problem Relation Age of Onset  . Coronary artery disease Mother        CABG  . CAD Sister   . Bone cancer Sister   . Alzheimer's disease Father   . Aneurysm Brother        brain  . Heart failure Sister   . Bone cancer Sister   . Colon cancer Neg Hx   . Colon  polyps Neg Hx   . Esophageal cancer Neg Hx   . Rectal cancer Neg Hx   . Stomach cancer Neg Hx     Social History   Socioeconomic History  . Marital status: Married    Spouse name: Joelene Millin  . Number of children: 1  . Years of education: 10  . Highest education level: Some college, no degree  Occupational History  . Occupation: [employer]    Comment: Retired - prior Oceanographer  Tobacco Use  . Smoking status: Former Smoker    Years: 1.00    Types: Cigarettes    Quit date: 04/27/1966    Years since quitting: 54.0  . Smokeless tobacco: Never Used  Vaping Use  . Vaping Use: Never used  Substance and Sexual Activity  . Alcohol use: Yes    Comment: occasional  . Drug use: No  . Sexual activity: Yes  Other Topics Concern  . Not on file  Social History Narrative  . Not on file   Social Determinants of Health   Financial Resource Strain: Not on file  Food Insecurity:  Not on file  Transportation Needs: Not on file  Physical Activity: Not on file  Stress: Not on file  Social Connections: Not on file  Intimate Partner Violence: Not on file    Outpatient Medications Prior to Visit  Medication Sig Dispense Refill  . albuterol (VENTOLIN HFA) 108 (90 Base) MCG/ACT inhaler INHALE 2 PUFFS INTO THE LUNGS EVERY 6 HOURS AS NEEDED FOR WHEEZING OR SHORTNESS OF BREATH 6.7 g 0  . ARNUITY ELLIPTA 200 MCG/ACT AEPB INHALE 1 PUFF INTO THE LUNGS DAILY. 90 each 0  . aspirin 81 MG chewable tablet Chew 1 tablet (81 mg total) by mouth daily.    Marland Kitchen atorvastatin (LIPITOR) 40 MG tablet Take 40 mg by mouth at bedtime.    . budesonide-formoterol (SYMBICORT) 80-4.5 MCG/ACT inhaler Inhale 2 puffs into the lungs 2 (two) times daily. 1 each 5  . cetirizine (ZYRTEC ALLERGY) 10 MG tablet Take 1 tablet (10 mg total) by mouth daily. 30 tablet 0  . EPINEPHrine 0.3 mg/0.3 mL IJ SOAJ injection Inject 0.3 mg into the muscle as needed for anaphylaxis. 1 each 1  . fluticasone (FLONASE) 50 MCG/ACT nasal spray USE 2 SPRAYS IN EACH NOSTRIL EVERY DAY 48 g 1  . guaiFENesin (MUCINEX) 600 MG 12 hr tablet Take 1,200 mg by mouth 2 (two) times daily as needed for to loosen phlegm.    Marland Kitchen ibuprofen (ADVIL) 200 MG tablet Take 200 mg by mouth every 6 (six) hours as needed.    . Melatonin 10 MG TABS Take 10 mg by mouth at bedtime.    . metoprolol tartrate (LOPRESSOR) 25 MG tablet Take 0.5 tablets (12.5 mg total) by mouth 2 (two) times daily. 90 tablet 3  . montelukast (SINGULAIR) 10 MG tablet Take 1 tablet (10 mg total) by mouth at bedtime. 30 tablet 5  . Multiple Vitamins-Minerals (EMERGEN-C BLUE) PACK Take 1 each by mouth daily.    . nitroGLYCERIN (NITROSTAT) 0.4 MG SL tablet Place 1 tablet (0.4 mg total) under the tongue every 5 (five) minutes as needed for chest pain. 25 tablet 12  . omeprazole (PRILOSEC) 20 MG capsule Take 1 capsule (20 mg total) by mouth every evening. 90 capsule 3  .  promethazine-dextromethorphan (PROMETHAZINE-DM) 6.25-15 MG/5ML syrup Take 5 mLs by mouth 4 (four) times daily as needed for cough. 118 mL 0  . traZODone (DESYREL) 50 MG tablet Take  1 tablet (50 mg total) by mouth at bedtime as needed for sleep. (Patient taking differently: Take 50 mg by mouth at bedtime.) 90 tablet 2   No facility-administered medications prior to visit.    Allergies  Allergen Reactions  . Bee Venom Shortness Of Breath  . Oxycodone Nausea And Vomiting  . Percocet [Oxycodone-Acetaminophen] Nausea And Vomiting    Review of Systems As per HPI.     Objective:    Physical Exam Vitals and nursing note reviewed.  Constitutional:      General: He is not in acute distress.    Appearance: Normal appearance. He is not toxic-appearing or diaphoretic.  HENT:     Head: Normocephalic and atraumatic.     Right Ear: Tympanic membrane, ear canal and external ear normal.     Left Ear: Tympanic membrane, ear canal and external ear normal.     Mouth/Throat:     Mouth: Mucous membranes are moist.     Pharynx: Oropharynx is clear. No pharyngeal swelling, oropharyngeal exudate or posterior oropharyngeal erythema.     Tonsils: No tonsillar exudate or tonsillar abscesses.     Comments: Oral thrush present to soft palate.  Eyes:     General:        Right eye: No discharge.        Left eye: No discharge.     Conjunctiva/sclera: Conjunctivae normal.  Cardiovascular:     Rate and Rhythm: Normal rate and regular rhythm.     Heart sounds: Normal heart sounds. No murmur heard.   Pulmonary:     Effort: Pulmonary effort is normal. No respiratory distress.     Breath sounds: No stridor. Wheezing present. No rhonchi or rales.  Chest:     Chest wall: No tenderness.  Skin:    General: Skin is dry.  Neurological:     General: No focal deficit present.     Mental Status: He is alert and oriented to person, place, and time.  Psychiatric:        Mood and Affect: Mood normal.         Behavior: Behavior normal.     BP 124/73   Pulse 70   Temp 97.9 F (36.6 C) (Temporal)   SpO2 97% Comment: room air Wt Readings from Last 3 Encounters:  04/05/20 208 lb (94.3 kg)  03/16/20 216 lb 12.8 oz (98.3 kg)  02/28/20 210 lb (95.3 kg)    Health Maintenance Due  Topic Date Due  . COVID-19 Vaccine (3 - Booster for Moderna series) 10/25/2019    There are no preventive care reminders to display for this patient.   Lab Results  Component Value Date   TSH 2.43 01/24/2020   Lab Results  Component Value Date   WBC 12.7 (H) 02/28/2020   HGB 14.9 02/28/2020   HCT 43.9 02/28/2020   MCV 87 02/28/2020   PLT 232 02/28/2020   Lab Results  Component Value Date   NA 138 02/28/2020   K 4.7 02/28/2020   CO2 20 02/28/2020   GLUCOSE 236 (H) 02/28/2020   BUN 17 02/28/2020   CREATININE 0.84 02/28/2020   BILITOT 0.2 02/28/2020   ALKPHOS 99 02/28/2020   AST 16 02/28/2020   ALT 52 (H) 02/28/2020   PROT 5.8 (L) 02/28/2020   ALBUMIN 3.6 (L) 02/28/2020   CALCIUM 8.6 02/28/2020   ANIONGAP 8 02/23/2020   Lab Results  Component Value Date   CHOL 135 06/03/2019   Lab Results  Component Value Date  HDL 45 06/03/2019   Lab Results  Component Value Date   LDLCALC 66 06/03/2019   Lab Results  Component Value Date   TRIG 137 06/03/2019   Lab Results  Component Value Date   CHOLHDL 3.0 06/03/2019   Lab Results  Component Value Date   HGBA1C 6.6 (H) 02/28/2020       Assessment & Plan:   Jatavian was seen today for wheezing.  Diagnoses and all orders for this visit:  Seasonal allergic rhinitis due to pollen Refill provided for singular. Steroid IM injection today in office.  -     montelukast (SINGULAIR) 10 MG tablet; Take 1 tablet (10 mg total) by mouth at bedtime. -     amoxicillin-clavulanate (AUGMENTIN) 875-125 MG tablet; Take 1 tablet by mouth 2 (two) times daily for 10 days. -     methylPREDNISolone acetate (DEPO-MEDROL) injection 80 mg  Moderate  persistent asthma with exacerbation Augmentin as below. Discontinue ICS inhalers and start Advair instead. Continue SABA prn. Steroid IM injection today in office. Prednisone burst to start tomorrow.  -     amoxicillin-clavulanate (AUGMENTIN) 875-125 MG tablet; Take 1 tablet by mouth 2 (two) times daily for 10 days. -     methylPREDNISolone acetate (DEPO-MEDROL) injection 80 mg -     predniSONE (DELTASONE) 20 MG tablet; Take 1 tablet (20 mg total) by mouth daily with breakfast for 3 days. -     Fluticasone-Salmeterol (ADVAIR DISKUS) 250-50 MCG/DOSE AEPB; Inhale 1 puff into the lungs 2 (two) times daily.  Oral thrush Swish and spit nystatin.  -     nystatin (MYCOSTATIN) 100000 UNIT/ML suspension; Take 5 mLs (500,000 Units total) by mouth 4 (four) times daily.  The patient indicates understanding of these issues and agrees with the plan.  Return to office for new or worsening symptoms, or if symptoms persist.   Gwenlyn Perking, FNP

## 2020-05-16 ENCOUNTER — Other Ambulatory Visit: Payer: Self-pay | Admitting: Family Medicine

## 2020-05-16 DIAGNOSIS — Z91030 Bee allergy status: Secondary | ICD-10-CM

## 2020-06-12 ENCOUNTER — Other Ambulatory Visit: Payer: Self-pay | Admitting: Family Medicine

## 2020-06-12 DIAGNOSIS — F5101 Primary insomnia: Secondary | ICD-10-CM

## 2020-06-12 NOTE — Telephone Encounter (Signed)
Last office visit 03/16/20 Office visit scheduled tomorrow Last refill 07/20/19, #90, 2 refills

## 2020-06-13 ENCOUNTER — Ambulatory Visit: Payer: Medicare HMO | Admitting: Family Medicine

## 2020-06-14 ENCOUNTER — Telehealth: Payer: Self-pay | Admitting: Family Medicine

## 2020-06-14 ENCOUNTER — Other Ambulatory Visit: Payer: Self-pay

## 2020-06-14 ENCOUNTER — Encounter: Payer: Self-pay | Admitting: Family Medicine

## 2020-06-14 ENCOUNTER — Ambulatory Visit (INDEPENDENT_AMBULATORY_CARE_PROVIDER_SITE_OTHER): Payer: Medicare HMO | Admitting: Family Medicine

## 2020-06-14 VITALS — BP 126/81 | HR 61 | Temp 97.7°F | Ht 69.0 in | Wt 216.6 lb

## 2020-06-14 DIAGNOSIS — G4733 Obstructive sleep apnea (adult) (pediatric): Secondary | ICD-10-CM

## 2020-06-14 DIAGNOSIS — J3089 Other allergic rhinitis: Secondary | ICD-10-CM

## 2020-06-14 DIAGNOSIS — F5101 Primary insomnia: Secondary | ICD-10-CM

## 2020-06-14 DIAGNOSIS — R002 Palpitations: Secondary | ICD-10-CM | POA: Diagnosis not present

## 2020-06-14 DIAGNOSIS — E785 Hyperlipidemia, unspecified: Secondary | ICD-10-CM

## 2020-06-14 DIAGNOSIS — R7309 Other abnormal glucose: Secondary | ICD-10-CM

## 2020-06-14 DIAGNOSIS — K219 Gastro-esophageal reflux disease without esophagitis: Secondary | ICD-10-CM

## 2020-06-14 DIAGNOSIS — J4530 Mild persistent asthma, uncomplicated: Secondary | ICD-10-CM

## 2020-06-14 DIAGNOSIS — J302 Other seasonal allergic rhinitis: Secondary | ICD-10-CM

## 2020-06-14 DIAGNOSIS — R252 Cramp and spasm: Secondary | ICD-10-CM

## 2020-06-14 DIAGNOSIS — I2510 Atherosclerotic heart disease of native coronary artery without angina pectoris: Secondary | ICD-10-CM

## 2020-06-14 DIAGNOSIS — R351 Nocturia: Secondary | ICD-10-CM

## 2020-06-14 LAB — URINALYSIS, ROUTINE W REFLEX MICROSCOPIC
Bilirubin, UA: NEGATIVE
Glucose, UA: NEGATIVE
Ketones, UA: NEGATIVE
Leukocytes,UA: NEGATIVE
Nitrite, UA: NEGATIVE
Protein,UA: NEGATIVE
RBC, UA: NEGATIVE
Specific Gravity, UA: 1.02 (ref 1.005–1.030)
Urobilinogen, Ur: 0.2 mg/dL (ref 0.2–1.0)
pH, UA: 5 (ref 5.0–7.5)

## 2020-06-14 LAB — BAYER DCA HB A1C WAIVED: HB A1C (BAYER DCA - WAIVED): 5.8 % (ref <7.0)

## 2020-06-14 MED ORDER — METOPROLOL TARTRATE 25 MG PO TABS: 12.5000 mg | ORAL_TABLET | Freq: Two times a day (BID) | ORAL | 3 refills | Status: DC

## 2020-06-14 MED ORDER — TRAZODONE HCL 50 MG PO TABS: 50.0000 mg | ORAL_TABLET | Freq: Every day | ORAL | 1 refills | Status: DC

## 2020-06-14 MED ORDER — OMEPRAZOLE 20 MG PO CPDR: 20.0000 mg | DELAYED_RELEASE_CAPSULE | Freq: Every evening | ORAL | 3 refills | Status: DC

## 2020-06-14 NOTE — Telephone Encounter (Signed)
I resent the 3 that he requested today to [facility]. I do not see that he was given a cough medication today was he supposed to get one/

## 2020-06-14 NOTE — Patient Instructions (Signed)
Muscle Cramps and Spasms Muscle cramps and spasms are when muscles tighten by themselves. They usually get better within minutes. Muscle cramps are painful. They are usually stronger and last longer than muscle spasms. Muscle spasms may or may not be painful. They can last a few seconds or much longer. Cramps and spasms can affect any muscle, but they occur most often in the calf muscles of the leg. They are usually not caused by a serious problem. In many cases, the cause is not known. Some common causes include:  Doing more physical work or exercise than your body is ready for.  Using the muscles too much (overuse) by repeating certain movements too many times.  Staying in a certain position for a long time.  Playing a sport or doing an activity without preparing properly.  Using bad form or technique while playing a sport or doing an activity.  Not having enough water in your body (dehydration).  Injury.  Side effects of some medicines.  Low levels of the salts and minerals in your blood (electrolytes), such as low potassium or calcium. Follow these instructions at home: Managing pain and stiffness  Massage, stretch, and relax the muscle. Do this for many minutes at a time.  If told, put heat on tight or tense muscles as often as told by your doctor. Use the heat source that your doctor recommends, such as a moist heat pack or a heating pad. ? Place a towel between your skin and the heat source. ? Leave the heat on for 20-30 minutes. ? Remove the heat if your skin turns bright red. This is very important if you are not able to feel pain, heat, or cold. You may have a greater risk of getting burned.  If told, put ice on the affected area. This may help if you are sore or have pain after a cramp or spasm. ? Put ice in a plastic bag. ? Place a towel between your skin and the bag. ? Leave the ice on for 20 minutes, 2-3 times a day.  Try taking hot showers or baths to help relax  tight muscles.      Eating and drinking  Drink enough fluid to keep your pee (urine) pale yellow.  Eat a healthy diet to help ensure that your muscles work well. This should include: ? Fruits and vegetables. ? Lean protein. ? Whole grains. ? Low-fat or nonfat dairy products. General instructions  If you are having cramps often, avoid intense exercise for several days.  Take over-the-counter and prescription medicines only as told by your doctor.  Watch for any changes in your symptoms.  Keep all follow-up visits as told by your doctor. This is important. Contact a doctor if:  Your cramps or spasms get worse or happen more often.  Your cramps or spasms do not get better with time. Summary  Muscle cramps and spasms are when muscles tighten by themselves. They usually get better within minutes.  Cramps and spasms occur most often in the calf muscles of the leg.  Massage, stretch, and relax the muscle. This may help the cramp or spasm go away.  Drink enough fluid to keep your pee (urine) pale yellow. This information is not intended to replace advice given to you by your health care provider. Make sure you discuss any questions you have with your health care provider. Document Revised: 06/01/2017 Document Reviewed: 06/01/2017 Elsevier Patient Education  2021 Elsevier Inc.  

## 2020-06-14 NOTE — Telephone Encounter (Signed)
Thank you. We did not discuss a cough medicine.

## 2020-06-14 NOTE — Progress Notes (Signed)
Assessment & Plan:  1. Hyperlipidemia LDL goal <70 Labs to assess. - Lipid panel - CMP14+EGFR - CBC with Differential/Platelet  2. Elevated hemoglobin A1c Labs to assess. Suspect A1c was elevated due to COVID and steroid use. - Bayer DCA Hb A1c Waived  3. Palpitations Well controlled on current regimen.   4. Gastroesophageal reflux disease, unspecified whether esophagitis present Well controlled on current regimen.  - CMP14+EGFR  5. Primary insomnia Well controlled on current regimen.  - CMP14+EGFR  6. Muscle cramping Labs to assess. Education provided on muscle cramping.  - CMP14+EGFR - CBC with Differential/Platelet - TSH - Magnesium  7. Nocturia - PSA, total and free - Urinalysis, Routine w reflex microscopic  8. Seasonal and perennial allergic rhinitis Well controlled on current regimen.   9. Mild persistent asthma, uncomplicated Well controlled on current regimen.   10. OSA (obstructive sleep apnea) Does not wear CPAP.  11. CAD in native artery On statin and ASA.    Return in about 6 months (around 12/15/2020) for annual physical.  Hendricks Limes, MSN, APRN, FNP-C [facility]  Subjective:    Patient ID: Ronald Berger, male    DOB: 11-15-1945, 75 y.o.   MRN: [id number]  Patient Care Team: Loman Brooklyn, FNP as PCP - General (Family Medicine) Debara Pickett Nadean Corwin, MD as PCP - Cardiology (Cardiology)   Chief Complaint:  Chief Complaint  Patient presents with  . Diabetes  . Hyperlipidemia    Check up of chronic medical conditions   . cramps all over body    X 2-3 months     HPI: Ronald Berger is a 75 y.o. male presenting on 06/14/2020 for Diabetes, Hyperlipidemia (Check up of chronic medical conditions ), and cramps all over body (X 2-3 months )  Patient has sleep apnea but is unable to tolerate a CPAP.  Patient has seasonal allergies for which he takes Zyrtec 10 mg once daily and Flonase.  Acid reflux  controlled with omeprazole 20 mg once daily.  Insomnia: controlled with Trazodone and melatonin. Previously failed therapy with temazepam and the lower dosage of Ambien. Ambien 10 mg was decreased previously due to his age.  Asthma: uses Advair twice daily, Singulair at bedtime, and Albuterol as needed which is not daily but may be more than once daily if he is doing something like mowing. He does report his asthma has been better since being on Advair.    Patient's A1c was elevated to 6.6 three months ago when he had COVID. He has not changed diet or exercise patterns.   New complaints: Patient reports cramping in legs, feet, hands, and back for a few months now. He drinks 5-6 bottles of water per day. He does work outside. He was previously taking mustard or vinegar for the cramping, but states this is no longer helping. He is eating a banana every morning.   Patient also reports getting up to urinate every two hours at night. States it takes longer than it should to urinate.    Social history:  Relevant past medical, surgical, family and social history reviewed and updated as indicated. Interim medical history since our last visit reviewed.  Allergies and medications reviewed and updated.  DATA REVIEWED: CHART IN EPIC  ROS: Negative unless specifically indicated above in HPI.    Current Outpatient Medications:  .  albuterol (VENTOLIN HFA) 108 (90 Base) MCG/ACT inhaler, INHALE 2 PUFFS INTO THE LUNGS EVERY 6 HOURS AS NEEDED FOR WHEEZING OR SHORTNESS  OF BREATH, Disp: 6.7 g, Rfl: 0 .  aspirin 81 MG chewable tablet, Chew 1 tablet (81 mg total) by mouth daily., Disp: , Rfl:  .  atorvastatin (LIPITOR) 40 MG tablet, Take 40 mg by mouth at bedtime., Disp: , Rfl:  .  cetirizine (ZYRTEC ALLERGY) 10 MG tablet, Take 1 tablet (10 mg total) by mouth daily., Disp: 30 tablet, Rfl: 0 .  EPINEPHRINE 0.3 mg/0.3 mL IJ SOAJ injection, INJECT 0.3 MG INTO THE MUSCLE AS NEEDED FOR ANAPHYLAXIS., Disp: 2 each,  Rfl: 2 .  fluticasone (FLONASE) 50 MCG/ACT nasal spray, USE 2 SPRAYS IN EACH NOSTRIL EVERY DAY, Disp: 48 g, Rfl: 1 .  Fluticasone-Salmeterol (ADVAIR DISKUS) 250-50 MCG/DOSE AEPB, Inhale 1 puff into the lungs 2 (two) times daily., Disp: 1 each, Rfl: 3 .  guaiFENesin (MUCINEX) 600 MG 12 hr tablet, Take 1,200 mg by mouth 2 (two) times daily as needed for to loosen phlegm., Disp: , Rfl:  .  ibuprofen (ADVIL) 200 MG tablet, Take 200 mg by mouth every 6 (six) hours as needed., Disp: , Rfl:  .  Melatonin 10 MG TABS, Take 10 mg by mouth at bedtime., Disp: , Rfl:  .  metoprolol tartrate (LOPRESSOR) 25 MG tablet, Take 0.5 tablets (12.5 mg total) by mouth 2 (two) times daily., Disp: 90 tablet, Rfl: 3 .  montelukast (SINGULAIR) 10 MG tablet, Take 1 tablet (10 mg total) by mouth at bedtime., Disp: 30 tablet, Rfl: 5 .  Multiple Vitamins-Minerals (EMERGEN-C BLUE) PACK, Take 1 each by mouth daily., Disp: , Rfl:  .  nitroGLYCERIN (NITROSTAT) 0.4 MG SL tablet, Place 1 tablet (0.4 mg total) under the tongue every 5 (five) minutes as needed for chest pain., Disp: 25 tablet, Rfl: 12 .  nystatin (MYCOSTATIN) 100000 UNIT/ML suspension, Take 5 mLs (500,000 Units total) by mouth 4 (four) times daily., Disp: 60 mL, Rfl: 0 .  omeprazole (PRILOSEC) 20 MG capsule, Take 1 capsule (20 mg total) by mouth every evening., Disp: 90 capsule, Rfl: 3 .  traZODone (DESYREL) 50 MG tablet, Take 1 tablet (50 mg total) by mouth at bedtime., Disp: 90 tablet, Rfl: 1   Allergies  Allergen Reactions  . Bee Venom Shortness Of Breath  . Oxycodone Nausea And Vomiting  . Percocet [Oxycodone-Acetaminophen] Nausea And Vomiting   Past Medical History:  Diagnosis Date  . Allergic rhinitis   . Allergy   . 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  . Bilateral carpal tunnel syndrome 12/08/2018  . 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 NSTEMI 07/2013 - DES  to mLCx, DES to prox OM1, DES to mLAD with nonobstructive RCA stenosis, EF 55-65%.  . Chronic kidney disease    kidney stones   . Dyslipidemia   . Family history of bone cancer   . GERD (gastroesophageal reflux disease)   . History of kidney stones   . History of non-ST elevation myocardial infarction (NSTEMI)    07/ 2015  S/P  DES X3  . History of palpitations   . Hyperlipidemia   . Hypertension   . Insomnia 12/08/2018   x many years  . Myocardial infarction ([place]) 2015   NSTEMI- 3 stents   . OSA (obstructive sleep apnea)    cpap intolerant  . Pneumonia due to COVID-19 virus   . Right knee meniscal tear   . S/P drug eluting coronary stent placement  07/ 2015  x3  to mLCFX, OM1, mLAD  . Sinus bradycardia   . Sleep apnea    no cpap     Past Surgical History:  Procedure Laterality Date  . CATARACT EXTRACTION, BILATERAL    . COLONOSCOPY     >10 yrs ago-polyps per pt- we have no report   . EXTRACORPOREAL SHOCK WAVE LITHOTRIPSY  x2 last one 2005  . KNEE ARTHROSCOPY Right 05/02/2015   Procedure: RIGHT KNEE ARTHROSCOPY WITH medial meniscal DEBRIDEMENT ;  Surgeon: Gaynelle Arabian, MD;  [facility];  Service: Orthopedics;  Laterality: Right;  . LEFT HEART CATHETERIZATION WITH CORONARY ANGIOGRAM N/A 08/05/2013   Procedure: LEFT HEART CATHETERIZATION WITH CORONARY ANGIOGRAM;  Surgeon: Blane Ohara, MD;  Location: [facility] CATH LAB;  Service: Cardiovascular;  Laterality: N/A;  Promus DES's to  mLCFx,  OM1,  mLAD (total 3)/   mRCA 30%,  dLM  20-30%,  preserved LVSF, ef 55-65%  . LUMBAR SPINE SURGERY  x3  last one 1980's  . SHOULDER ARTHROSCOPY Right 2013  . TONSILLECTOMY      Social History   Socioeconomic History  . Marital status: Married    Spouse name: Joelene Millin  . Number of children: 1  . Years of education: 42  . Highest education level: Some college, no degree  Occupational History  . Occupation: [employer]    Comment: Retired - prior  Oceanographer  Tobacco Use  . Smoking status: Former Smoker    Years: 1.00    Types: Cigarettes    Quit date: 04/27/1966    Years since quitting: 54.1  . Smokeless tobacco: Never Used  Vaping Use  . Vaping Use: Never used  Substance and Sexual Activity  . Alcohol use: Yes    Comment: occasional  . Drug use: No  . Sexual activity: Yes  Other Topics Concern  . Not on file  Social History Narrative  . Not on file   Social Determinants of Health   Financial Resource Strain: Not on file  Food Insecurity: Not on file  Transportation Needs: Not on file  Physical Activity: Not on file  Stress: Not on file  Social Connections: Not on file  Intimate Partner Violence: Not on file        Objective:    BP 126/81   Pulse 61   Temp 97.7 F (36.5 C) (Temporal)   Ht _0  (1.753 m)   Wt 216 lb 9.6 oz (98.2 kg)   SpO2 96%   BMI 31.99 kg/m   Wt Readings from Last 3 Encounters:  06/14/20 216 lb 9.6 oz (98.2 kg)  04/05/20 208 lb (94.3 kg)  03/16/20 216 lb 12.8 oz (98.3 kg)    Physical Exam Vitals reviewed.  Constitutional:      General: He is not in acute distress.    Appearance: Normal appearance. He is obese. He is not ill-appearing, toxic-appearing or diaphoretic.  HENT:     Head: Normocephalic and atraumatic.  Eyes:     General: No scleral icterus.       Right eye: No discharge.        Left eye: No discharge.     Conjunctiva/sclera: Conjunctivae normal.  Cardiovascular:     Rate and Rhythm: Normal rate and regular rhythm.     Heart sounds: Normal heart sounds. No murmur heard. No friction rub. No gallop.   Pulmonary:     Effort: Pulmonary effort is normal. No respiratory distress.  Breath sounds: Normal breath sounds. No stridor. No wheezing, rhonchi or rales.  Musculoskeletal:        General: Normal range of motion.     Cervical back: Normal range of motion.  Skin:    General: Skin is warm and dry.  Neurological:     Mental Status: He is alert and oriented to  person, place, and time. Mental status is at baseline.  Psychiatric:        Mood and Affect: Mood normal.        Behavior: Behavior normal.        Thought Content: Thought content normal.        Judgment: Judgment normal.     Lab Results  Component Value Date   TSH 2.43 01/24/2020   Lab Results  Component Value Date   WBC 12.7 (H) 02/28/2020   HGB 14.9 02/28/2020   HCT 43.9 02/28/2020   MCV 87 02/28/2020   PLT 232 02/28/2020   Lab Results  Component Value Date   NA 138 02/28/2020   K 4.7 02/28/2020   CO2 20 02/28/2020   GLUCOSE 236 (H) 02/28/2020   BUN 17 02/28/2020   CREATININE 0.84 02/28/2020   BILITOT 0.2 02/28/2020   ALKPHOS 99 02/28/2020   AST 16 02/28/2020   ALT 52 (H) 02/28/2020   PROT 5.8 (L) 02/28/2020   ALBUMIN 3.6 (L) 02/28/2020   CALCIUM 8.6 02/28/2020   ANIONGAP 8 02/23/2020   Lab Results  Component Value Date   CHOL 135 06/03/2019   Lab Results  Component Value Date   HDL 45 06/03/2019   Lab Results  Component Value Date   LDLCALC 66 06/03/2019   Lab Results  Component Value Date   TRIG 137 06/03/2019   Lab Results  Component Value Date   CHOLHDL 3.0 06/03/2019   Lab Results  Component Value Date   HGBA1C 6.6 (H) 02/28/2020

## 2020-06-15 ENCOUNTER — Encounter: Payer: Self-pay | Admitting: Family Medicine

## 2020-06-15 LAB — CBC WITH DIFFERENTIAL/PLATELET
Basophils Absolute: 0.1 10*3/uL (ref 0.0–0.2)
Basos: 1 %
EOS (ABSOLUTE): 0.1 10*3/uL (ref 0.0–0.4)
Eos: 2 %
Hematocrit: 46.1 % (ref 37.5–51.0)
Hemoglobin: 15.2 g/dL (ref 13.0–17.7)
Immature Grans (Abs): 0 10*3/uL (ref 0.0–0.1)
Immature Granulocytes: 0 %
Lymphocytes Absolute: 1.9 10*3/uL (ref 0.7–3.1)
Lymphs: 36 %
MCH: 28.6 pg (ref 26.6–33.0)
MCHC: 33 g/dL (ref 31.5–35.7)
MCV: 87 fL (ref 79–97)
Monocytes Absolute: 0.9 10*3/uL (ref 0.1–0.9)
Monocytes: 17 %
Neutrophils Absolute: 2.3 10*3/uL (ref 1.4–7.0)
Neutrophils: 44 %
Platelets: 192 10*3/uL (ref 150–450)
RBC: 5.32 x10E6/uL (ref 4.14–5.80)
RDW: 12.5 % (ref 11.6–15.4)
WBC: 5.2 10*3/uL (ref 3.4–10.8)

## 2020-06-15 LAB — CMP14+EGFR
ALT: 34 IU/L (ref 0–44)
AST: 29 IU/L (ref 0–40)
Albumin/Globulin Ratio: 1.8 (ref 1.2–2.2)
Albumin: 4.1 g/dL (ref 3.7–4.7)
Alkaline Phosphatase: 102 IU/L (ref 44–121)
BUN/Creatinine Ratio: 12 (ref 10–24)
BUN: 14 mg/dL (ref 8–27)
Bilirubin Total: 0.5 mg/dL (ref 0.0–1.2)
CO2: 25 mmol/L (ref 20–29)
Calcium: 9 mg/dL (ref 8.6–10.2)
Chloride: 101 mmol/L (ref 96–106)
Creatinine, Ser: 1.13 mg/dL (ref 0.76–1.27)
Globulin, Total: 2.3 g/dL (ref 1.5–4.5)
Glucose: 93 mg/dL (ref 65–99)
Potassium: 4 mmol/L (ref 3.5–5.2)
Sodium: 139 mmol/L (ref 134–144)
Total Protein: 6.4 g/dL (ref 6.0–8.5)
eGFR: 69 mL/min/{1.73_m2} (ref >59)

## 2020-06-15 LAB — LIPID PANEL
Chol/HDL Ratio: 3 ratio (ref 0.0–5.0)
Cholesterol, Total: 106 mg/dL (ref 100–199)
HDL: 35 mg/dL — ABNORMAL LOW (ref >39)
LDL Chol Calc (NIH): 55 mg/dL (ref 0–99)
Triglycerides: 77 mg/dL (ref 0–149)
VLDL Cholesterol Cal: 16 mg/dL (ref 5–40)

## 2020-06-15 LAB — PSA, TOTAL AND FREE
PSA, Free Pct: 28 %
PSA, Free: 1.82 ng/mL
Prostate Specific Ag, Serum: 6.5 ng/mL — ABNORMAL HIGH (ref 0.0–4.0)

## 2020-06-15 LAB — MAGNESIUM: Magnesium: 2 mg/dL (ref 1.6–2.3)

## 2020-06-15 LAB — TSH: TSH: 3.67 u[IU]/mL (ref 0.450–4.500)

## 2020-06-15 NOTE — Telephone Encounter (Signed)
Patient aware.

## 2020-06-19 ENCOUNTER — Encounter: Payer: Self-pay | Admitting: Family Medicine

## 2020-06-19 NOTE — Progress Notes (Signed)
Shingrix not in [employer]

## 2020-06-23 ENCOUNTER — Other Ambulatory Visit: Payer: Self-pay

## 2020-06-23 ENCOUNTER — Encounter: Payer: Self-pay | Admitting: Emergency Medicine

## 2020-06-23 ENCOUNTER — Ambulatory Visit
Admission: EM | Admit: 2020-06-23 | Discharge: 2020-06-23 | Disposition: A | Discharge status: Home / Self Care | Payer: Medicare HMO | Attending: Emergency Medicine | Admitting: Emergency Medicine

## 2020-06-23 DIAGNOSIS — J0100 Acute maxillary sinusitis, unspecified: Secondary | ICD-10-CM | POA: Diagnosis not present

## 2020-06-23 MED ORDER — BENZONATATE 100 MG PO CAPS: 100.0000 mg | ORAL_CAPSULE | Freq: Three times a day (TID) | ORAL | 0 refills | Status: DC | PRN

## 2020-06-23 MED ORDER — AMOXICILLIN-POT CLAVULANATE 875-125 MG PO TABS: 1.0000 | ORAL_TABLET | Freq: Two times a day (BID) | ORAL | 0 refills | Status: DC

## 2020-06-23 MED ORDER — PREDNISONE 10 MG PO TABS: 20.0000 mg | ORAL_TABLET | Freq: Every day | ORAL | 0 refills | Status: AC

## 2020-06-23 NOTE — ED Provider Notes (Signed)
[name]   [id number] 06/23/20 Arrival Time: 1134   Chief Complaint  Patient presents with  . chest congestion     SUBJECTIVE: History from: patient.  Ronald Berger is a 75 y.o. male who presents to the urgent care for complaint of cough, sinus pressure and pain and chest congestion for the past 10 days.  Denies sick exposure to COVID, flu or strep.  Denies recent travel.  Has tried OTC medication without relief.  Denies alleviating or aggravating factors.  Denies previous symptoms in the past.   Denies fever, chills, fatigue, sinus pain, rhinorrhea, sore throat, SOB, wheezing, chest pain, nausea, changes in bowel or bladder habits.     ROS: As per HPI.  All other pertinent ROS negative.     Past Medical History:  Diagnosis Date  . Allergic rhinitis   . Allergy   . 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  . Bilateral carpal tunnel syndrome 12/08/2018  . 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 NSTEMI 07/2013 - DES to mLCx, DES to prox OM1, DES to mLAD with nonobstructive RCA stenosis, EF 55-65%.  . Chronic kidney disease    kidney stones   . Dyslipidemia   . Family history of bone cancer   . GERD (gastroesophageal reflux disease)   . History of kidney stones   . History of non-ST elevation myocardial infarction (NSTEMI)    07/ 2015  S/P  DES X3  . History of palpitations   . Hyperlipidemia   . Hypertension   . Insomnia 12/08/2018   x many years  . Myocardial infarction ([place]) 2015   NSTEMI- 3 stents   . OSA (obstructive sleep apnea)    cpap intolerant  . Pneumonia due to COVID-19 virus   . Right knee meniscal tear   . S/P drug eluting coronary stent placement    07/ 2015  x3  to mLCFX, OM1, mLAD  . Sinus bradycardia   . Sleep apnea    no cpap    Past Surgical History:  Procedure Laterality Date  . CATARACT EXTRACTION, BILATERAL    . COLONOSCOPY     >10 yrs  ago-polyps per pt- we have no report   . EXTRACORPOREAL SHOCK WAVE LITHOTRIPSY  x2 last one 2005  . KNEE ARTHROSCOPY Right 05/02/2015   Procedure: RIGHT KNEE ARTHROSCOPY WITH medial meniscal DEBRIDEMENT ;  Surgeon: Gaynelle Arabian, MD;  Location: [facility];  Service: Orthopedics;  Laterality: Right;  . LEFT HEART CATHETERIZATION WITH CORONARY ANGIOGRAM N/A 08/05/2013   Procedure: LEFT HEART CATHETERIZATION WITH CORONARY ANGIOGRAM;  Surgeon: Blane Ohara, MD;  Location[facility] CATH LAB;  Service: Cardiovascular;  Laterality: N/A;  Promus DES's to  mLCFx,  OM1,  mLAD (total 3)/   mRCA 30%,  dLM  20-30%,  preserved LVSF, ef 55-65%  . LUMBAR SPINE SURGERY  x3  last one 1980's  . SHOULDER ARTHROSCOPY Right 2013  . TONSILLECTOMY     Allergies  Allergen Reactions  . Bee Venom Shortness Of Breath  . Oxycodone Nausea And Vomiting  . Percocet [Oxycodone-Acetaminophen] Nausea And Vomiting   No current facility-administered medications on file prior to encounter.   Current Outpatient Medications on File Prior to Encounter  Medication Sig Dispense Refill  . albuterol (VENTOLIN HFA) 108 (90 Base) MCG/ACT inhaler INHALE 2 PUFFS INTO THE LUNGS EVERY 6 HOURS AS NEEDED FOR  WHEEZING OR SHORTNESS OF BREATH 6.7 g 0  . aspirin 81 MG chewable tablet Chew 1 tablet (81 mg total) by mouth daily.    Marland Kitchen atorvastatin (LIPITOR) 40 MG tablet Take 40 mg by mouth at bedtime.    . cetirizine (ZYRTEC ALLERGY) 10 MG tablet Take 1 tablet (10 mg total) by mouth daily. 30 tablet 0  . EPINEPHRINE 0.3 mg/0.3 mL IJ SOAJ injection INJECT 0.3 MG INTO THE MUSCLE AS NEEDED FOR ANAPHYLAXIS. 2 each 2  . fluticasone (FLONASE) 50 MCG/ACT nasal spray USE 2 SPRAYS IN EACH NOSTRIL EVERY DAY 48 g 1  . Fluticasone-Salmeterol (ADVAIR DISKUS) 250-50 MCG/DOSE AEPB Inhale 1 puff into the lungs 2 (two) times daily. 1 each 3  . guaiFENesin (MUCINEX) 600 MG 12 hr tablet Take 1,200 mg by mouth 2 (two) times daily as needed for to loosen  phlegm.    Marland Kitchen ibuprofen (ADVIL) 200 MG tablet Take 200 mg by mouth every 6 (six) hours as needed.    . Melatonin 10 MG TABS Take 10 mg by mouth at bedtime.    . metoprolol tartrate (LOPRESSOR) 25 MG tablet Take 0.5 tablets (12.5 mg total) by mouth 2 (two) times daily. 90 tablet 3  . montelukast (SINGULAIR) 10 MG tablet Take 1 tablet (10 mg total) by mouth at bedtime. 30 tablet 5  . Multiple Vitamins-Minerals (EMERGEN-C BLUE) PACK Take 1 each by mouth daily.    . nitroGLYCERIN (NITROSTAT) 0.4 MG SL tablet Place 1 tablet (0.4 mg total) under the tongue every 5 (five) minutes as needed for chest pain. 25 tablet 12  . omeprazole (PRILOSEC) 20 MG capsule Take 1 capsule (20 mg total) by mouth every evening. 90 capsule 3  . traZODone (DESYREL) 50 MG tablet Take 1 tablet (50 mg total) by mouth at bedtime. 90 tablet 1  . nystatin (MYCOSTATIN) 100000 UNIT/ML suspension Take 5 mLs (500,000 Units total) by mouth 4 (four) times daily. 60 mL 0   Social History   Socioeconomic History  . Marital status: Married    Spouse name: Joelene Millin  . Number of children: 1  . Years of education: 40  . Highest education level: Some college, no degree  Occupational History  . Occupation: [name]    Comment: Retired - prior Oceanographer  Tobacco Use  . Smoking status: Former Smoker    Years: 1.00    Types: Cigarettes    Quit date: 04/27/1966    Years since quitting: 54.1  . Smokeless tobacco: Never Used  Vaping Use  . Vaping Use: Never used  Substance and Sexual Activity  . Alcohol use: Yes    Comment: occasional  . Drug use: No  . Sexual activity: Yes  Other Topics Concern  . Not on file  Social History Narrative  . Not on file   Social Determinants of Health   Financial Resource Strain: Not on file  Food Insecurity: Not on file  Transportation Needs: Not on file  Physical Activity: Not on file  Stress: Not on file  Social Connections: Not on file  Intimate Partner Violence: Not on  file   Family History  Problem Relation Age of Onset  . Coronary artery disease Mother        CABG  . CAD Sister   . Bone cancer Sister   . Alzheimer's disease Father   . Aneurysm Brother        brain  . Heart failure Sister   . Bone cancer Sister   .  Colon cancer Neg Hx   . Colon polyps Neg Hx   . Esophageal cancer Neg Hx   . Rectal cancer Neg Hx   . Stomach cancer Neg Hx     OBJECTIVE:  Vitals:   06/23/20 1251  BP: (!) 145/82  Pulse: 66  Temp: 98.6 F (37 C)  TempSrc: Oral  SpO2: 98%     General appearance: alert; appears fatigued, but nontoxic; speaking in full sentences and tolerating own secretions HEENT: NCAT; Ears: EACs clear, TMs pearly gray; Eyes: PERRL.  EOM grossly intact. Sinuses: Maxillary sinus tender; Nose: nares patent without rhinorrhea, Throat: oropharynx clear, tonsils non erythematous or enlarged, uvula midline  Neck: supple without LAD Lungs: unlabored respirations, symmetrical air entry; cough: mild; no respiratory distress; CTAB Heart: regular rate and rhythm.  Radial pulses 2+ symmetrical bilaterally Skin: warm and dry Psychological: alert and cooperative; normal mood and affect  LABS:  No results found for this or any previous visit (from the past 24 hour(s)).   ASSESSMENT & PLAN:  1. Acute non-recurrent maxillary sinusitis     Meds ordered this encounter  Medications  . amoxicillin-clavulanate (AUGMENTIN) 875-125 MG tablet    Sig: Take 1 tablet by mouth every 12 (twelve) hours.    Dispense:  14 tablet    Refill:  0  . benzonatate (TESSALON) 100 MG capsule    Sig: Take 1 capsule (100 mg total) by mouth 3 (three) times daily as needed for cough.    Dispense:  30 capsule    Refill:  0  . predniSONE (DELTASONE) 10 MG tablet    Sig: Take 2 tablets (20 mg total) by mouth daily for 5 days.    Dispense:  10 tablet    Refill:  0    Discharge Instructions  Get plenty of rest and push fluids Tessalon Perles prescribed for  cough Augmentin was prescribed/take as directed Prednisone was prescribed Use medications daily for symptom relief Use OTC medications like ibuprofen or tylenol as needed fever or pain Call or go to the ED if you have any new or worsening symptoms such as fever, worsening cough, shortness of breath, chest tightness, chest pain, turning blue, changes in mental status, etc...   Reviewed expectations re: course of current medical issues. Questions answered. Outlined signs and symptoms indicating need for more acute intervention. Patient verbalized understanding. After Visit Summary given.         Emerson Monte, FNP 06/23/20 1323

## 2020-06-23 NOTE — ED Triage Notes (Signed)
Patient c/o chest congestion and sinus infection x 1 week.  Patient is afebrile.  Patient has tried OTC meds and Mucinex.  Patient is vaccinated for COVID.  No recent at home COVID test performed.

## 2020-06-23 NOTE — Discharge Instructions (Addendum)
  Get plenty of rest and push fluids Tessalon Perles prescribed for cough Augmentin was prescribed/take as directed Prednisone was prescribed Use medications daily for symptom relief Use OTC medications like ibuprofen or tylenol as needed fever or pain Call or go to the ED if you have any new or worsening symptoms such as fever, worsening cough, shortness of breath, chest tightness, chest pain, turning blue, changes in mental status, etc..Marland Kitchen

## 2020-08-10 ENCOUNTER — Other Ambulatory Visit: Payer: Self-pay | Admitting: Family Medicine

## 2020-08-10 DIAGNOSIS — J302 Other seasonal allergic rhinitis: Secondary | ICD-10-CM

## 2020-08-15 ENCOUNTER — Other Ambulatory Visit: Payer: Self-pay | Admitting: Family Medicine

## 2020-08-15 DIAGNOSIS — J302 Other seasonal allergic rhinitis: Secondary | ICD-10-CM

## 2020-09-11 ENCOUNTER — Other Ambulatory Visit: Payer: Self-pay | Admitting: Family Medicine

## 2020-09-11 DIAGNOSIS — J4540 Moderate persistent asthma, uncomplicated: Secondary | ICD-10-CM

## 2020-09-14 ENCOUNTER — Other Ambulatory Visit: Payer: Self-pay | Admitting: Family Medicine

## 2020-09-14 DIAGNOSIS — J4541 Moderate persistent asthma with (acute) exacerbation: Secondary | ICD-10-CM

## 2020-10-18 ENCOUNTER — Other Ambulatory Visit: Payer: Self-pay | Admitting: Family

## 2020-10-18 DIAGNOSIS — U071 COVID-19: Secondary | ICD-10-CM

## 2020-11-02 ENCOUNTER — Other Ambulatory Visit: Payer: Self-pay | Admitting: *Deleted

## 2020-11-02 DIAGNOSIS — J4541 Moderate persistent asthma with (acute) exacerbation: Secondary | ICD-10-CM

## 2020-11-02 MED ORDER — FLUTICASONE-SALMETEROL 250-50 MCG/ACT IN AEPB: INHALATION_SPRAY | RESPIRATORY_TRACT | 0 refills | Status: DC

## 2020-12-17 ENCOUNTER — Other Ambulatory Visit: Payer: Self-pay | Admitting: Family Medicine

## 2020-12-17 DIAGNOSIS — J4541 Moderate persistent asthma with (acute) exacerbation: Secondary | ICD-10-CM

## 2021-01-05 ENCOUNTER — Other Ambulatory Visit: Payer: Self-pay | Admitting: Family Medicine

## 2021-01-05 DIAGNOSIS — J301 Allergic rhinitis due to pollen: Secondary | ICD-10-CM

## 2021-02-06 ENCOUNTER — Other Ambulatory Visit: Payer: Self-pay | Admitting: Family Medicine

## 2021-02-06 DIAGNOSIS — J4540 Moderate persistent asthma, uncomplicated: Secondary | ICD-10-CM

## 2021-02-06 MED ORDER — ALBUTEROL SULFATE HFA 108 (90 BASE) MCG/ACT IN AERS: 2.0000 | INHALATION_SPRAY | Freq: Four times a day (QID) | RESPIRATORY_TRACT | 0 refills | Status: DC | PRN

## 2021-02-06 NOTE — Telephone Encounter (Signed)
Made appt next week 01/24

## 2021-02-06 NOTE — Telephone Encounter (Signed)
[facility] NTBS for 6 mos ckup. Mail order not sent

## 2021-02-06 NOTE — Addendum Note (Signed)
Addended by: Antonietta Barcelona D on: 02/06/2021 02:11 PM   Modules accepted: Orders

## 2021-02-10 ENCOUNTER — Other Ambulatory Visit: Payer: Self-pay

## 2021-02-10 ENCOUNTER — Ambulatory Visit
Admission: RE | Admit: 2021-02-10 | Discharge: 2021-02-10 | Disposition: A | Discharge status: Home / Self Care | Payer: Medicare HMO | Source: Ambulatory Visit | Attending: Urgent Care | Admitting: Urgent Care

## 2021-02-10 VITALS — BP 153/79 | HR 80 | Temp 98.1°F | Resp 20

## 2021-02-10 DIAGNOSIS — U071 COVID-19: Secondary | ICD-10-CM | POA: Diagnosis not present

## 2021-02-10 DIAGNOSIS — I519 Heart disease, unspecified: Secondary | ICD-10-CM | POA: Diagnosis not present

## 2021-02-10 DIAGNOSIS — R058 Other specified cough: Secondary | ICD-10-CM

## 2021-02-10 DIAGNOSIS — J4540 Moderate persistent asthma, uncomplicated: Secondary | ICD-10-CM

## 2021-02-10 MED ORDER — BENZONATATE 100 MG PO CAPS: 100.0000 mg | ORAL_CAPSULE | Freq: Three times a day (TID) | ORAL | 0 refills | Status: DC | PRN

## 2021-02-10 MED ORDER — PAXLOVID (300/100) 20 X 150 MG & 10 X 100MG PO TBPK: 1.0000 | ORAL_TABLET | Freq: Two times a day (BID) | ORAL | 0 refills | Status: DC

## 2021-02-10 MED ORDER — PROMETHAZINE-DM 6.25-15 MG/5ML PO SYRP: 5.0000 mL | ORAL_SOLUTION | Freq: Every evening | ORAL | 0 refills | Status: DC | PRN

## 2021-02-10 MED ORDER — PREDNISONE 50 MG PO TABS: 50.0000 mg | ORAL_TABLET | Freq: Every day | ORAL | 0 refills | Status: DC

## 2021-02-10 NOTE — ED Triage Notes (Signed)
Positive home test last night for covid.  Fever, cough, body aches, sore throat, chills since Friday.

## 2021-02-10 NOTE — ED Provider Notes (Signed)
[facility]   MRN: [id number] DOB: 02/09/1945  Subjective:   Ronald Berger is a 76 y.o. male presenting for 3-day history of acute onset fevers, coughing, body aches, throat pain.  He did a COVID test last night and was positive. Has pmh of asthma, heart disease, NSTEMI, renal stones but not kidney disease, COVID pneumonia about 2 years ago.  No active chest pain, shortness of breath or wheezing.  The cough however is really bothersome to him.  Would like to get COVID antiviral medications.  No current facility-administered medications for this encounter.  Current Outpatient Medications:    albuterol (VENTOLIN HFA) 108 (90 Base) MCG/ACT inhaler, Inhale 2 puffs into the lungs every 6 (six) hours as needed for wheezing or shortness of breath., Disp: 24 each, Rfl: 0   amoxicillin-clavulanate (AUGMENTIN) 875-125 MG tablet, Take 1 tablet by mouth every 12 (twelve) hours., Disp: 14 tablet, Rfl: 0   aspirin 81 MG chewable tablet, Chew 1 tablet (81 mg total) by mouth daily., Disp: , Rfl:    atorvastatin (LIPITOR) 40 MG tablet, TAKE 1 TABLET (40 MG TOTAL) BY MOUTH EVERY EVENING., Disp: 90 tablet, Rfl: 1   benzonatate (TESSALON) 100 MG capsule, Take 1 capsule (100 mg total) by mouth 3 (three) times daily as needed for cough., Disp: 30 capsule, Rfl: 0   cetirizine (ZYRTEC ALLERGY) 10 MG tablet, Take 1 tablet (10 mg total) by mouth daily., Disp: 30 tablet, Rfl: 0   EPINEPHRINE 0.3 mg/0.3 mL IJ SOAJ injection, INJECT 0.3 MG INTO THE MUSCLE AS NEEDED FOR ANAPHYLAXIS., Disp: 2 each, Rfl: 2   fluticasone (FLONASE) 50 MCG/ACT nasal spray, USE 2 SPRAYS IN EACH NOSTRIL EVERY DAY, Disp: 48 g, Rfl: 1   fluticasone-salmeterol (ADVAIR) 250-50 MCG/ACT AEPB, INHALE 1 PUFF BY MOUTH TWICE DAILY, Disp: 60 each, Rfl: 0   guaiFENesin (MUCINEX) 600 MG 12 hr tablet, Take 1,200 mg by mouth 2 (two) times daily as needed for to loosen phlegm., Disp: , Rfl:    ibuprofen (ADVIL) 200 MG tablet, Take 200 mg by  mouth every 6 (six) hours as needed., Disp: , Rfl:    Melatonin 10 MG TABS, Take 10 mg by mouth at bedtime., Disp: , Rfl:    metoprolol tartrate (LOPRESSOR) 25 MG tablet, Take 0.5 tablets (12.5 mg total) by mouth 2 (two) times daily., Disp: 90 tablet, Rfl: 3   montelukast (SINGULAIR) 10 MG tablet, TAKE 1 TABLET BY MOUTH AT BEDTIME, Disp: 30 tablet, Rfl: 0   Multiple Vitamins-Minerals (EMERGEN-C BLUE) PACK, Take 1 each by mouth daily., Disp: , Rfl:    nitroGLYCERIN (NITROSTAT) 0.4 MG SL tablet, Place 1 tablet (0.4 mg total) under the tongue every 5 (five) minutes as needed for chest pain., Disp: 25 tablet, Rfl: 12   nystatin (MYCOSTATIN) 100000 UNIT/ML suspension, Take 5 mLs (500,000 Units total) by mouth 4 (four) times daily., Disp: 60 mL, Rfl: 0   omeprazole (PRILOSEC) 20 MG capsule, Take 1 capsule (20 mg total) by mouth every evening., Disp: 90 capsule, Rfl: 3   traZODone (DESYREL) 50 MG tablet, Take 1 tablet (50 mg total) by mouth at bedtime., Disp: 90 tablet, Rfl: 1   Allergies  Allergen Reactions   Bee Venom Shortness Of Breath   Oxycodone Nausea And Vomiting   Percocet [Oxycodone-Acetaminophen] Nausea And Vomiting    Past Medical History:  Diagnosis Date   Allergic rhinitis    Allergy    Asthma due to environmental allergies    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 NSTEMI 07/2013 - DES to mLCx, DES to prox OM1, DES to mLAD with nonobstructive RCA stenosis, EF 55-65%.   Chronic kidney disease    kidney stones    Dyslipidemia    Family history of bone cancer    GERD (gastroesophageal reflux disease)    History of kidney stones    History of non-ST elevation myocardial infarction (NSTEMI)    07/ 2015  S/P  DES X3   History of palpitations    Hyperlipidemia    Hypertension    Insomnia 12/08/2018   x many years   Myocardial infarction [facility])  2015   NSTEMI- 3 stents    OSA (obstructive sleep apnea)    cpap intolerant   Pneumonia due to COVID-19 virus    Right knee meniscal tear    S/P drug eluting coronary stent placement    07/ 2015  x3  to mLCFX, OM1, mLAD   Sinus bradycardia    Sleep apnea    no cpap      Past Surgical History:  Procedure Laterality Date   CATARACT EXTRACTION, BILATERAL     COLONOSCOPY     >10 yrs ago-polyps per pt- we have no report    EXTRACORPOREAL SHOCK WAVE LITHOTRIPSY  x2 last one 2005   KNEE ARTHROSCOPY Right 05/02/2015   Procedure: RIGHT KNEE ARTHROSCOPY WITH medial meniscal DEBRIDEMENT ;  Surgeon: Gaynelle Arabian, MD;  Location: [facility];  Service: Orthopedics;  Laterality: Right;   LEFT HEART CATHETERIZATION WITH CORONARY ANGIOGRAM N/A 08/05/2013   Procedure: LEFT HEART CATHETERIZATION WITH CORONARY ANGIOGRAM;  Surgeon: Blane Ohara, MD;  Location: [facility] CATH LAB;  Service: Cardiovascular;  Laterality: N/A;  Promus DES's to  mLCFx,  OM1,  mLAD (total 3)/   mRCA 30%,  dLM  20-30%,  preserved LVSF, ef 55-65%   LUMBAR SPINE SURGERY  x3  last one 1980's   SHOULDER ARTHROSCOPY Right 2013   TONSILLECTOMY      Family History  Problem Relation Age of Onset   Coronary artery disease Mother        CABG   CAD Sister    Bone cancer Sister    Alzheimer's disease Father    Aneurysm Brother        brain   Heart failure Sister    Bone cancer Sister    Colon cancer Neg Hx    Colon polyps Neg Hx    Esophageal cancer Neg Hx    Rectal cancer Neg Hx    Stomach cancer Neg Hx     Social History   Tobacco Use   Smoking status: Former    Years: 1.00    Types: Cigarettes    Quit date: 04/27/1966    Years since quitting: 54.8   Smokeless tobacco: Never  Vaping Use   Vaping Use: Never used  Substance Use Topics   Alcohol use: Yes    Comment: occasional   Drug use: No    ROS   Objective:   Vitals: BP (!) 153/79 (BP Location: Right Arm)    Pulse 80    Temp 98.1 F (36.7 C)  (Temporal)    Resp 20    SpO2 92%   Physical Exam Constitutional:      General: He is not in acute distress.    Appearance: Normal  appearance. He is well-developed. He is not ill-appearing, toxic-appearing or diaphoretic.  HENT:     Head: Normocephalic and atraumatic.     Right Ear: External ear normal.     Left Ear: External ear normal.     Nose: Nose normal.     Mouth/Throat:     Mouth: Mucous membranes are moist.  Eyes:     General: No scleral icterus.       Right eye: No discharge.        Left eye: No discharge.     Extraocular Movements: Extraocular movements intact.  Cardiovascular:     Rate and Rhythm: Normal rate and regular rhythm.     Heart sounds: Normal heart sounds. No murmur heard.   No friction rub. No gallop.  Pulmonary:     Effort: Pulmonary effort is normal. No respiratory distress.     Breath sounds: Normal breath sounds. No stridor. No wheezing, rhonchi or rales.     Comments: Persistent coughing throughout exam.  No use of accessory muscles.  No pursed lips, cyanotic lips. Neurological:     Mental Status: He is alert and oriented to person, place, and time.  Psychiatric:        Mood and Affect: Mood normal.        Behavior: Behavior normal.        Thought Content: Thought content normal.    Assessment and Plan :   PDMP not reviewed this encounter.  1. Clinical diagnosis of COVID-19   2. Moderate persistent asthma without complication   3. Productive cough   4. Heart disease    Patient is not in respiratory distress but he is high risk given his chronic conditions.  Recommended starting Paxlovid to help him with his COVID infection.  Confirmation testing pending.  We will have him hold Advair and atorvastatin.  No history of CKD, extensive lab review done but will obtain a metabolic panel to confirm that he does not have any active CKD. Deferred imaging given clear cardiopulmonary exam, hemodynamically stable vital signs. Counseled patient on potential  for adverse effects with medications prescribed/recommended today, ER and return-to-clinic precautions discussed, patient verbalized understanding.    Jaynee Eagles, PA-C 02/10/21 1131

## 2021-02-10 NOTE — Discharge Instructions (Signed)
Your COVID confirmation test is pending.  However, I would like to prescribe you the COVID antiviral, Paxlovid.  You are a high risk patient given your asthma history of heart disease and history of COVID-pneumonia and therefore I highly recommend that you start this.  We did obtain blood work to confirm that your kidneys are still doing okay.  I do not see a history of kidney disease from your kidney function levels.  However, if the labs come back abnormal we will have you stop the Paxlovid.  Please do not take your Advair or atorvastatin since these medications interact with the COVID antiviral.  Instead I want you to take prednisone to help with your asthma.  Use the cough suppression medications as needed.  If possible, I would like for you to take only half your dose of trazodone.

## 2021-02-11 LAB — COMPREHENSIVE METABOLIC PANEL
ALT: 27 IU/L (ref 0–44)
AST: 32 IU/L (ref 0–40)
Albumin/Globulin Ratio: 1.5 (ref 1.2–2.2)
Albumin: 3.8 g/dL (ref 3.7–4.7)
Alkaline Phosphatase: 92 IU/L (ref 44–121)
BUN/Creatinine Ratio: 14 (ref 10–24)
BUN: 14 mg/dL (ref 8–27)
Bilirubin Total: 0.8 mg/dL (ref 0.0–1.2)
CO2: 21 mmol/L (ref 20–29)
Calcium: 8.7 mg/dL (ref 8.6–10.2)
Chloride: 104 mmol/L (ref 96–106)
Creatinine, Ser: 1.01 mg/dL (ref 0.76–1.27)
Globulin, Total: 2.5 g/dL (ref 1.5–4.5)
Glucose: 107 mg/dL — ABNORMAL HIGH (ref 70–99)
Potassium: 4.1 mmol/L (ref 3.5–5.2)
Sodium: 137 mmol/L (ref 134–144)
Total Protein: 6.3 g/dL (ref 6.0–8.5)
eGFR: 79 mL/min/{1.73_m2} (ref >59)

## 2021-02-11 LAB — NOVEL CORONAVIRUS, NAA: SARS-CoV-2, NAA: DETECTED — AB

## 2021-02-11 LAB — SARS-COV-2, NAA 2 DAY TAT

## 2021-02-12 ENCOUNTER — Encounter: Payer: Self-pay | Admitting: Family Medicine

## 2021-02-12 ENCOUNTER — Telehealth (INDEPENDENT_AMBULATORY_CARE_PROVIDER_SITE_OTHER): Payer: Medicare HMO | Admitting: Family Medicine

## 2021-02-12 DIAGNOSIS — R972 Elevated prostate specific antigen [PSA]: Secondary | ICD-10-CM

## 2021-02-12 DIAGNOSIS — J4530 Mild persistent asthma, uncomplicated: Secondary | ICD-10-CM

## 2021-02-12 DIAGNOSIS — G4733 Obstructive sleep apnea (adult) (pediatric): Secondary | ICD-10-CM

## 2021-02-12 DIAGNOSIS — R351 Nocturia: Secondary | ICD-10-CM

## 2021-02-12 DIAGNOSIS — J4541 Moderate persistent asthma with (acute) exacerbation: Secondary | ICD-10-CM | POA: Insufficient documentation

## 2021-02-12 DIAGNOSIS — K219 Gastro-esophageal reflux disease without esophagitis: Secondary | ICD-10-CM | POA: Diagnosis not present

## 2021-02-12 DIAGNOSIS — J301 Allergic rhinitis due to pollen: Secondary | ICD-10-CM | POA: Diagnosis not present

## 2021-02-12 DIAGNOSIS — R7303 Prediabetes: Secondary | ICD-10-CM | POA: Diagnosis not present

## 2021-02-12 DIAGNOSIS — F5101 Primary insomnia: Secondary | ICD-10-CM | POA: Diagnosis not present

## 2021-02-12 DIAGNOSIS — J3089 Other allergic rhinitis: Secondary | ICD-10-CM | POA: Diagnosis not present

## 2021-02-12 DIAGNOSIS — E785 Hyperlipidemia, unspecified: Secondary | ICD-10-CM

## 2021-02-12 DIAGNOSIS — I2510 Atherosclerotic heart disease of native coronary artery without angina pectoris: Secondary | ICD-10-CM

## 2021-02-12 DIAGNOSIS — R002 Palpitations: Secondary | ICD-10-CM | POA: Diagnosis not present

## 2021-02-12 DIAGNOSIS — J302 Other seasonal allergic rhinitis: Secondary | ICD-10-CM

## 2021-02-12 DIAGNOSIS — R200 Anesthesia of skin: Secondary | ICD-10-CM

## 2021-02-12 MED ORDER — FLUTICASONE-SALMETEROL 250-50 MCG/ACT IN AEPB: 1.0000 | INHALATION_SPRAY | Freq: Two times a day (BID) | RESPIRATORY_TRACT | 5 refills | Status: DC

## 2021-02-12 MED ORDER — TRAZODONE HCL 50 MG PO TABS: 50.0000 mg | ORAL_TABLET | Freq: Every day | ORAL | 1 refills | Status: DC

## 2021-02-12 MED ORDER — LEVOCETIRIZINE DIHYDROCHLORIDE 5 MG PO TABS: 5.0000 mg | ORAL_TABLET | Freq: Every evening | ORAL | 1 refills | Status: DC

## 2021-02-12 MED ORDER — NITROGLYCERIN 0.4 MG SL SUBL: 0.4000 mg | SUBLINGUAL_TABLET | SUBLINGUAL | 2 refills | Status: DC | PRN

## 2021-02-12 MED ORDER — ATORVASTATIN CALCIUM 40 MG PO TABS: 40.0000 mg | ORAL_TABLET | Freq: Every day | ORAL | 1 refills | Status: DC

## 2021-02-12 MED ORDER — MONTELUKAST SODIUM 10 MG PO TABS: 10.0000 mg | ORAL_TABLET | Freq: Every day | ORAL | 1 refills | Status: DC

## 2021-02-12 MED ORDER — TAMSULOSIN HCL 0.4 MG PO CAPS: 0.4000 mg | ORAL_CAPSULE | Freq: Every day | ORAL | 1 refills | Status: DC

## 2021-02-12 NOTE — Progress Notes (Signed)
Virtual Visit via Video note  I connected with Ronald Berger on 02/12/21 at 9:52 AM by video and verified that I am speaking with the correct person using two identifiers. Ronald Berger is currently located at home and nobody is currently with him during visit. The provider, Loman Brooklyn, FNP is located in their office at time of visit.  I discussed the limitations, risks, security and privacy concerns of performing an evaluation and management service by video and the availability of in person appointments. I also discussed with the patient that there may be a patient responsible charge related to this service. The patient expressed understanding and agreed to proceed.  Subjective: PCP: Loman Brooklyn, FNP  Chief Complaint  Patient presents with   Medical Management of Chronic Issues   Patient has sleep apnea but is unable to tolerate a CPAP.   Patient has seasonal allergies for which he takes Xyzal 5 mg once daily and Flonase.   Acid reflux controlled with omeprazole 20 mg once daily.   Insomnia: controlled with Trazodone and melatonin. Previously failed therapy with temazepam and the lower dosage of Ambien. Ambien 10 mg was decreased previously due to his age.   Asthma: uses Advair twice daily, Singulair at bedtime, and Albuterol as needed which is not daily but may be more than once daily if he is doing something like mowing. He does report his asthma has been better since being on Advair.     Prediabetes: last A1c 5.8 in May 2022.  GERD: controlled with omeprazole.  CAD: taking atorvastatin.  Elevated PSA: PSA elevated at 6.5 in May 2022. It was recommended he see a urologist; he wanted to think about it. We never heard back regarding his decision. He reports he isn't sure what happened and why he never got back to [place].  He is concerned that his arms keep going numb from the elbow down. It occurs mostly at night. It does happen bilaterally on the side that he is  laying on. It also occurs when he is driving down the road and holding the steering wheel with both hands. He has a history of carpal tunnel and does have bilateral wrist splints, which he has not been wearing.    ROS: Per HPI  Current Outpatient Medications:    albuterol (VENTOLIN HFA) 108 (90 Base) MCG/ACT inhaler, Inhale 2 puffs into the lungs every 6 (six) hours as needed for wheezing or shortness of breath., Disp: 24 each, Rfl: 0   aspirin 81 MG chewable tablet, Chew 1 tablet (81 mg total) by mouth daily., Disp: , Rfl:    atorvastatin (LIPITOR) 40 MG tablet, TAKE 1 TABLET (40 MG TOTAL) BY MOUTH EVERY EVENING., Disp: 90 tablet, Rfl: 1   benzonatate (TESSALON) 100 MG capsule, Take 1-2 capsules (100-200 mg total) by mouth 3 (three) times daily as needed for cough., Disp: 60 capsule, Rfl: 0   EPINEPHRINE 0.3 mg/0.3 mL IJ SOAJ injection, INJECT 0.3 MG INTO THE MUSCLE AS NEEDED FOR ANAPHYLAXIS., Disp: 2 each, Rfl: 2   fluticasone (FLONASE) 50 MCG/ACT nasal spray, USE 2 SPRAYS IN EACH NOSTRIL EVERY DAY, Disp: 48 g, Rfl: 1   fluticasone-salmeterol (ADVAIR) 250-50 MCG/ACT AEPB, INHALE 1 PUFF BY MOUTH TWICE DAILY, Disp: 60 each, Rfl: 0   guaiFENesin (MUCINEX) 600 MG 12 hr tablet, Take 1,200 mg by mouth 2 (two) times daily as needed for to loosen phlegm., Disp: , Rfl:    ibuprofen (ADVIL) 200 MG tablet, Take 200  mg by mouth every 6 (six) hours as needed., Disp: , Rfl:    Melatonin 10 MG TABS, Take 10 mg by mouth at bedtime., Disp: , Rfl:    metoprolol tartrate (LOPRESSOR) 25 MG tablet, Take 0.5 tablets (12.5 mg total) by mouth 2 (two) times daily., Disp: 90 tablet, Rfl: 3   montelukast (SINGULAIR) 10 MG tablet, TAKE 1 TABLET BY MOUTH AT BEDTIME, Disp: 30 tablet, Rfl: 0   Multiple Vitamins-Minerals (EMERGEN-C BLUE) PACK, Take 1 each by mouth daily., Disp: , Rfl:    nirmatrelvir & ritonavir (PAXLOVID, 300/100,) 20 x 150 MG & 10 x 100MG TBPK, Take 1 tablet by mouth 2 (two) times daily., Disp: 30 tablet,  Rfl: 0   nitroGLYCERIN (NITROSTAT) 0.4 MG SL tablet, Place 1 tablet (0.4 mg total) under the tongue every 5 (five) minutes as needed for chest pain., Disp: 25 tablet, Rfl: 12   nystatin (MYCOSTATIN) 100000 UNIT/ML suspension, Take 5 mLs (500,000 Units total) by mouth 4 (four) times daily., Disp: 60 mL, Rfl: 0   omeprazole (PRILOSEC) 20 MG capsule, Take 1 capsule (20 mg total) by mouth every evening., Disp: 90 capsule, Rfl: 3   predniSONE (DELTASONE) 50 MG tablet, Take 1 tablet (50 mg total) by mouth daily with breakfast., Disp: 5 tablet, Rfl: 0   promethazine-dextromethorphan (PROMETHAZINE-DM) 6.25-15 MG/5ML syrup, Take 5 mLs by mouth at bedtime as needed for cough., Disp: 100 mL, Rfl: 0   traZODone (DESYREL) 50 MG tablet, Take 1 tablet (50 mg total) by mouth at bedtime., Disp: 90 tablet, Rfl: 1  Allergies  Allergen Reactions   Bee Venom Shortness Of Breath   Oxycodone Nausea And Vomiting   Percocet [Oxycodone-Acetaminophen] Nausea And Vomiting   Past Medical History:  Diagnosis Date   Allergic rhinitis    Allergy    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 NSTEMI 07/2013 - DES to mLCx, DES to prox OM1, DES to mLAD with nonobstructive RCA stenosis, EF 55-65%.   Chronic kidney disease    kidney stones    Dyslipidemia    Family history of bone cancer    GERD (gastroesophageal reflux disease)    History of kidney stones    History of non-ST elevation myocardial infarction (NSTEMI)    07/ 2015  S/P  DES X3   History of palpitations    Hyperlipidemia    Hypertension    Insomnia 12/08/2018   x many years   Myocardial infarction ([place]) 2015   NSTEMI- 3 stents    OSA (obstructive sleep apnea)    cpap intolerant   Pneumonia due to COVID-19 virus    Right knee meniscal tear    S/P drug eluting coronary stent  placement    07/ 2015  x3  to mLCFX, OM1, mLAD   Sinus bradycardia    Sleep apnea    no cpap     Observations/Objective: Physical Exam Constitutional:      General: He is not in acute distress.    Appearance: Normal appearance. He is not ill-appearing or toxic-appearing.  Eyes:     General: No scleral icterus.       Right eye: No discharge.        Left eye: No discharge.     Conjunctiva/sclera: Conjunctivae normal.  Pulmonary:  Effort: Pulmonary effort is normal. No respiratory distress.  Neurological:     Mental Status: He is alert and oriented to person, place, and time.  Psychiatric:        Mood and Affect: Mood normal.        Behavior: Behavior normal.        Thought Content: Thought content normal.        Judgment: Judgment normal.   Assessment and Plan: 1. Primary insomnia Well controlled on current regimen.  - traZODone (DESYREL) 50 MG tablet; Take 1 tablet (50 mg total) by mouth at bedtime.  Dispense: 90 tablet; Refill: 1 - CMP14+EGFR; Future  2. Mild persistent asthma, uncomplicated Well controlled on current regimen.  - fluticasone-salmeterol (ADVAIR) 250-50 MCG/ACT AEPB; Inhale 1 puff into the lungs in the morning and at bedtime.  Dispense: 60 each; Refill: 5 - montelukast (SINGULAIR) 10 MG tablet; Take 1 tablet (10 mg total) by mouth at bedtime.  Dispense: 90 tablet; Refill: 1 - CMP14+EGFR; Future  3. Seasonal allergic rhinitis due to pollen Well controlled on current regimen.  - montelukast (SINGULAIR) 10 MG tablet; Take 1 tablet (10 mg total) by mouth at bedtime.  Dispense: 90 tablet; Refill: 1 - levocetirizine (XYZAL) 5 MG tablet; Take 1 tablet (5 mg total) by mouth every evening.  Dispense: 90 tablet; Refill: 1 - CMP14+EGFR; Future  4. OSA (obstructive sleep apnea) Not able to tolerate CPAP.  5. Seasonal and perennial allergic rhinitis Well controlled on current regimen.   6. Prediabetes - Bayer DCA Hb A1c Waived; Future  7. Palpitations Well  controlled on current regimen.   8. Gastroesophageal reflux disease, unspecified whether esophagitis present Well controlled on current regimen.  - CMP14+EGFR; Future  9. Hyperlipidemia LDL goal <70 Well controlled on current regimen. Advised to wait until 3 days after Paxlovid to resume taking. - atorvastatin (LIPITOR) 40 MG tablet; Take 1 tablet (40 mg total) by mouth daily.  Dispense: 90 tablet; Refill: 1 - CBC with Differential/Platelet; Future - CMP14+EGFR; Future - Lipid panel; Future  10. CAD in native artery Continue statin and aspirin. - atorvastatin (LIPITOR) 40 MG tablet; Take 1 tablet (40 mg total) by mouth daily.  Dispense: 90 tablet; Refill: 1  11. Elevated PSA - Ambulatory referral to Urology - PSA, total and free; Future  12. Nocturia Trial of Flomax.  - tamsulosin (FLOMAX) 0.4 MG CAPS capsule; Take 1 capsule (0.4 mg total) by mouth daily.  Dispense: 90 capsule; Refill: 1  13. Bilateral hand numbness Patient is going to start wearing wrist splints again. He will let me know if he needs to a referral to an orthopedic.    Follow Up Instructions: Return in about 6 months (around 08/12/2021) for annual physical.   I discussed the assessment and treatment plan with the patient. The patient was provided an opportunity to ask questions and all were answered. The patient agreed with the plan and demonstrated an understanding of the instructions.   The patient was advised to call back or seek an in-person evaluation if the symptoms worsen or if the condition fails to improve as anticipated.  The above assessment and management plan was discussed with the patient. The patient verbalized understanding of and has agreed to the management plan. Patient is aware to call the clinic if symptoms persist or worsen. Patient is aware when to return to the clinic for a follow-up visit. Patient educated on when it is appropriate to go to the emergency department.   Time call  ended:  10:13  I provided 21 minutes of face-to-face time during this encounter.   Hendricks Limes, MSN, APRN, FNP-C [facility] 02/12/21

## 2021-02-19 ENCOUNTER — Other Ambulatory Visit: Payer: Self-pay

## 2021-02-19 ENCOUNTER — Ambulatory Visit: Payer: Medicare HMO | Admitting: Urology

## 2021-02-19 ENCOUNTER — Encounter: Payer: Self-pay | Admitting: Urology

## 2021-02-19 VITALS — BP 130/80 | HR 66

## 2021-02-19 DIAGNOSIS — N401 Enlarged prostate with lower urinary tract symptoms: Secondary | ICD-10-CM

## 2021-02-19 DIAGNOSIS — N138 Other obstructive and reflux uropathy: Secondary | ICD-10-CM | POA: Diagnosis not present

## 2021-02-19 DIAGNOSIS — R351 Nocturia: Secondary | ICD-10-CM | POA: Diagnosis not present

## 2021-02-19 DIAGNOSIS — R972 Elevated prostate specific antigen [PSA]: Secondary | ICD-10-CM

## 2021-02-19 LAB — URINALYSIS, ROUTINE W REFLEX MICROSCOPIC
Bilirubin, UA: NEGATIVE
Glucose, UA: NEGATIVE
Ketones, UA: NEGATIVE
Leukocytes,UA: NEGATIVE
Nitrite, UA: NEGATIVE
Protein,UA: NEGATIVE
Specific Gravity, UA: 1.015 (ref 1.005–1.030)
Urobilinogen, Ur: 0.2 mg/dL (ref 0.2–1.0)
pH, UA: 6.5 (ref 5.0–7.5)

## 2021-02-19 LAB — MICROSCOPIC EXAMINATION: WBC, UA: NONE SEEN /hpf (ref 0–5)

## 2021-02-19 LAB — BLADDER SCAN AMB NON-IMAGING: Scan Result: 138

## 2021-02-19 MED ORDER — SILODOSIN 8 MG PO CAPS: 8.0000 mg | ORAL_CAPSULE | Freq: Every day | ORAL | 11 refills | Status: DC

## 2021-02-19 NOTE — Progress Notes (Signed)
02/19/2021 10:41 AM   Ronald Berger 23-Sep-1945 [id number]  Referring provider: Loman Berger, [place],  [place] [address]  Nocturia and elevated PSA   HPI: Ronald Berger is a 76yo here for evaluation of elevated PSA and BPH with nocturia. IPSS 27 QOL 4. Stream is weak. Nocturia 3-4x. He has to strain to urinate. He has been taking flomax 0.4mg  daily for several years. UA normal. No dysuria or hematuria. PVR 138cc. His PSA in 05/2020 was 6.5. No prior PSA. No family hx of prostate cancer. He has a hx of nephrolithiasis and has not had an event in over 5 years.    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 NSTEMI 07/2013 - DES to mLCx, DES to prox OM1, DES to mLAD with nonobstructive RCA stenosis, EF 55-65%.   Chronic kidney disease    kidney stones    Dyslipidemia    Family history of bone cancer    GERD (gastroesophageal reflux disease)    History of kidney stones    History of non-ST elevation myocardial infarction (NSTEMI)    07/ 2015  S/P  DES X3   History of palpitations    Hyperlipidemia    Hypertension    Insomnia 12/08/2018   x many years   Myocardial infarction [facility]) 2015   NSTEMI- 3 stents    OSA (obstructive sleep apnea)    cpap intolerant   Pneumonia due to COVID-19 virus    Right knee meniscal tear    S/P drug eluting coronary stent placement    07/ 2015  x3  to mLCFX, OM1, mLAD   Sinus bradycardia    Sleep apnea    no cpap     Surgical History: Past Surgical History:  Procedure Laterality Date   CATARACT EXTRACTION, BILATERAL     COLONOSCOPY     >10 yrs ago-polyps per pt- we have no report    EXTRACORPOREAL SHOCK WAVE LITHOTRIPSY  x2 last one 2005   KNEE ARTHROSCOPY Right  05/02/2015   Procedure: RIGHT KNEE ARTHROSCOPY WITH medial meniscal DEBRIDEMENT ;  Surgeon: Gaynelle Arabian, MD;  Location: [facility];  Service: Orthopedics;  Laterality: Right;   LEFT HEART CATHETERIZATION WITH CORONARY ANGIOGRAM N/A 08/05/2013   Procedure: LEFT HEART CATHETERIZATION WITH CORONARY ANGIOGRAM;  Surgeon: Blane Ohara, MD;  Location[facility] C[facility] LAB;  Service: Cardiovascular;  Laterality: N/A;  Promus DES's to  mLCFx,  OM1,  mLAD (total 3)/   mRCA 30%,  dLM  20-30%,  preserved LVSF, ef 55-65%   LUMBAR SPINE SURGERY  x3  last one 1980's   SHOULDER ARTHROSCOPY Right 2013   TONSILLECTOMY      Home Medications:  Allergies as of 02/19/2021       Reactions   Bee Venom Shortness Of Breath   Oxycodone Nausea And Vomiting   Percocet [oxycodone-acetaminophen] Nausea And Vomiting        Medication List        Accurate as of February 19, 2021 10:41 AM. If you have any questions, ask your nurse or doctor.          albuterol 108 (90 Base) MCG/ACT inhaler Commonly known  as: VENTOLIN HFA Inhale 2 puffs into the lungs every 6 (six) hours as needed for wheezing or shortness of breath.   aspirin 81 MG chewable tablet Chew 1 tablet (81 mg total) by mouth daily.   atorvastatin 40 MG tablet Commonly known as: LIPITOR Take 1 tablet (40 mg total) by mouth daily.   benzonatate 100 MG capsule Commonly known as: TESSALON Take 1-2 capsules (100-200 mg total) by mouth 3 (three) times daily as needed for cough.   Emergen-C Blue Pack Take 1 each by mouth daily.   EPINEPHrine 0.3 mg/0.3 mL Soaj injection Commonly known as: EPI-PEN INJECT 0.3 MG INTO THE MUSCLE AS NEEDED FOR ANAPHYLAXIS.   fluticasone 50 MCG/ACT nasal spray Commonly known as: FLONASE USE 2 SPRAYS IN EACH NOSTRIL EVERY DAY   fluticasone-salmeterol 250-50 MCG/ACT Aepb Commonly known as: ADVAIR Inhale 1 puff into the lungs in the morning and at bedtime.   guaiFENesin 600 MG 12 hr tablet Commonly  known as: MUCINEX Take 1,200 mg by mouth 2 (two) times daily as needed for to loosen phlegm.   ibuprofen 200 MG tablet Commonly known as: ADVIL Take 200 mg by mouth every 6 (six) hours as needed.   levocetirizine 5 MG tablet Commonly known as: XYZAL Take 1 tablet (5 mg total) by mouth every evening.   Melatonin 10 MG Tabs Take 10 mg by mouth at bedtime.   metoprolol tartrate 25 MG tablet Commonly known as: LOPRESSOR Take 0.5 tablets (12.5 mg total) by mouth 2 (two) times daily.   montelukast 10 MG tablet Commonly known as: SINGULAIR Take 1 tablet (10 mg total) by mouth at bedtime.   nitroGLYCERIN 0.4 MG SL tablet Commonly known as: NITROSTAT Place 1 tablet (0.4 mg total) under the tongue every 5 (five) minutes as needed for chest pain.   nystatin 100000 UNIT/ML suspension Commonly known as: MYCOSTATIN Take 5 mLs (500,000 Units total) by mouth 4 (four) times daily.   omeprazole 20 MG capsule Commonly known as: PRILOSEC Take 1 capsule (20 mg total) by mouth every evening.   Paxlovid (300/100) 20 x 150 MG & 10 x 100MG  Tbpk Generic drug: nirmatrelvir & ritonavir Take 1 tablet by mouth 2 (two) times daily.   predniSONE 50 MG tablet Commonly known as: DELTASONE Take 1 tablet (50 mg total) by mouth daily with breakfast.   promethazine-dextromethorphan 6.25-15 MG/5ML syrup Commonly known as: PROMETHAZINE-DM Take 5 mLs by mouth at bedtime as needed for cough.   tamsulosin 0.4 MG Caps capsule Commonly known as: FLOMAX Take 1 capsule (0.4 mg total) by mouth daily.   traZODone 50 MG tablet Commonly known as: DESYREL Take 1 tablet (50 mg total) by mouth at bedtime.        Allergies:  Allergies  Allergen Reactions   Bee Venom Shortness Of Breath   Oxycodone Nausea And Vomiting   Percocet [Oxycodone-Acetaminophen] Nausea And Vomiting    Family History: Family History  Problem Relation Age of Onset   Coronary artery disease Mother        CABG   CAD Sister     Bone cancer Sister    Alzheimer's disease Father    Aneurysm Brother        brain   Heart failure Sister    Bone cancer Sister    Colon cancer Neg Hx    Colon polyps Neg Hx    Esophageal cancer Neg Hx    Rectal cancer Neg Hx    Stomach cancer Neg Hx  Social History:  reports that he quit smoking about 54 years ago. His smoking use included cigarettes. He has never used smokeless tobacco. He reports current alcohol use. He reports that he does not use drugs.  ROS: All other review of systems were reviewed and are negative except what is noted above in HPI  Physical Exam: BP 130/80    Pulse 66   Constitutional:  Alert and oriented, No acute distress. HEENT:  AT, moist mucus membranes.  Trachea midline, no masses. Cardiovascular: No clubbing, cyanosis, or edema. Respiratory: Normal respiratory effort, no increased work of breathing. GI: Abdomen is soft, nontender, nondistended, no abdominal masses GU: No CVA tenderness. Circumcised phallus. No masses/lesions on penis, testis, scrotum. Prostate 50g smooth no nodules no induration.  Lymph: No cervical or inguinal lymphadenopathy. Skin: No rashes, bruises or suspicious lesions. Neurologic: Grossly intact, no focal deficits, moving all 4 extremities. Psychiatric: Normal mood and affect.  Laboratory Data: Lab Results  Component Value Date   WBC 5.2 06/14/2020   HGB 15.2 06/14/2020   HCT 46.1 06/14/2020   MCV 87 06/14/2020   PLT 192 06/14/2020    Lab Results  Component Value Date   CREATININE 1.01 02/10/2021    No results found for: PSA  No results found for: TESTOSTERONE  Lab Results  Component Value Date   HGBA1C 5.8 06/14/2020    Urinalysis    Component Value Date/Time   COLORURINE YELLOW 02/20/2020 2218   APPEARANCEUR Clear 06/14/2020 0900   LABSPEC 1.023 02/20/2020 2218   PHURINE 5.0 02/20/2020 2218   GLUCOSEU Negative 06/14/2020 0900   HGBUR NEGATIVE 02/20/2020 2218   BILIRUBINUR Negative 06/14/2020  0900   KETONESUR NEGATIVE 02/20/2020 2218   PROTEINUR Negative 06/14/2020 0900   PROTEINUR NEGATIVE 02/20/2020 2218   NITRITE Negative 06/14/2020 0900   NITRITE NEGATIVE 02/20/2020 2218   LEUKOCYTESUR Negative 06/14/2020 0900   LEUKOCYTESUR NEGATIVE 02/20/2020 2218    No results found for: LABMICR, [place], RBCUA, LABEPIT, MUCUS, BACTERIA  Pertinent Imaging:  No results found for this or any previous visit.  No results found for this or any previous visit.  No results found for this or any previous visit.  No results found for this or any previous visit.  No results found for this or any previous visit.  No results found for this or any previous visit.  No results found for this or any previous visit.  No results found for this or any previous visit.   Assessment & Plan:    1. Elevated PSA -We will recheck PSA once we have addressed his urinary issues - Urinalysis, Routine w reflex microscopic - BLADDER SCAN AMB NON-IMAGING  2. Benign prostatic hyperplasia with urinary obstruction -Start rapaflo 8mg  daily  3. Nocturia -Start rapaflo 8mg  daily   No follow-ups on file.  Nicolette Bang, MD  [facility] [place]

## 2021-02-19 NOTE — Progress Notes (Signed)
post void residual=138 Urological Symptom Review  Patient is experiencing the following symptoms: Frequent urination Hard to postpone urination Get up at night to urinate Stream starts and stops Have to strain to urinate Erection problems (male only)   Review of Systems  Gastrointestinal (upper)  : Negative for upper GI symptoms  Gastrointestinal (lower) : Diarrhea  Constitutional : Negative for symptoms  Skin: Negative for skin symptoms  Eyes: negative  Ear/Nose/Throat : Negative for Ear/Nose/Throat symptoms  Hematologic/Lymphatic: Negative for Hematologic/Lymphatic symptoms  Cardiovascular : Negative for cardiovascular symptoms  Respiratory : Negative for respiratory symptoms  Endocrine: Negative for endocrine symptoms  Musculoskeletal: Joint pain  Neurological: Negative for neurological symptoms  Psychologic: Negative for psychiatric symptoms

## 2021-02-19 NOTE — Patient Instructions (Signed)

## 2021-02-21 ENCOUNTER — Other Ambulatory Visit: Payer: Self-pay | Admitting: Family Medicine

## 2021-02-21 DIAGNOSIS — J4530 Mild persistent asthma, uncomplicated: Secondary | ICD-10-CM

## 2021-02-21 DIAGNOSIS — J301 Allergic rhinitis due to pollen: Secondary | ICD-10-CM

## 2021-03-04 ENCOUNTER — Ambulatory Visit (INDEPENDENT_AMBULATORY_CARE_PROVIDER_SITE_OTHER): Payer: Medicare HMO | Admitting: Family

## 2021-03-04 ENCOUNTER — Encounter: Payer: Self-pay | Admitting: Family

## 2021-03-04 ENCOUNTER — Other Ambulatory Visit: Payer: Self-pay

## 2021-03-04 VITALS — BP 139/72 | HR 79 | Temp 97.7°F | Ht 69.0 in | Wt 227.4 lb

## 2021-03-04 DIAGNOSIS — B9689 Other specified bacterial agents as the cause of diseases classified elsewhere: Secondary | ICD-10-CM

## 2021-03-04 DIAGNOSIS — E785 Hyperlipidemia, unspecified: Secondary | ICD-10-CM

## 2021-03-04 DIAGNOSIS — Z8616 Personal history of COVID-19: Secondary | ICD-10-CM | POA: Diagnosis not present

## 2021-03-04 DIAGNOSIS — J208 Acute bronchitis due to other specified organisms: Secondary | ICD-10-CM

## 2021-03-04 DIAGNOSIS — J301 Allergic rhinitis due to pollen: Secondary | ICD-10-CM

## 2021-03-04 DIAGNOSIS — J4530 Mild persistent asthma, uncomplicated: Secondary | ICD-10-CM

## 2021-03-04 DIAGNOSIS — J4541 Moderate persistent asthma with (acute) exacerbation: Secondary | ICD-10-CM

## 2021-03-04 DIAGNOSIS — R7303 Prediabetes: Secondary | ICD-10-CM | POA: Diagnosis not present

## 2021-03-04 DIAGNOSIS — K219 Gastro-esophageal reflux disease without esophagitis: Secondary | ICD-10-CM

## 2021-03-04 DIAGNOSIS — F5101 Primary insomnia: Secondary | ICD-10-CM | POA: Diagnosis not present

## 2021-03-04 DIAGNOSIS — R972 Elevated prostate specific antigen [PSA]: Secondary | ICD-10-CM | POA: Diagnosis not present

## 2021-03-04 LAB — BAYER DCA HB A1C WAIVED: HB A1C (BAYER DCA - WAIVED): 5.6 % (ref 4.8–5.6)

## 2021-03-04 MED ORDER — PREDNISONE 10 MG (21) PO TBPK
ORAL_TABLET | ORAL | 0 refills | Status: DC
Start: 2021-03-04 — End: 2021-03-18

## 2021-03-04 MED ORDER — AZITHROMYCIN 250 MG PO TABS
ORAL_TABLET | ORAL | 0 refills | Status: DC
Start: 2021-03-04 — End: 2021-03-18

## 2021-03-04 NOTE — Progress Notes (Signed)
Subjective:    Patient ID: Ronald Berger, male    DOB: 14-Jul-1945, 76 y.o.   MRN: [id number]  Chief Complaint  Patient presents with   Cough   Pt presents to the office today with worsening cough. He had COVID 02/10/21.  Cough This is a recurrent problem. The current episode started 1 to 4 weeks ago. The problem has been gradually worsening. The problem occurs every few minutes. The cough is Productive of sputum. Associated symptoms include ear pain, nasal congestion, postnasal drip and wheezing. Pertinent negatives include no chills, ear congestion, fever, headaches, myalgias, rhinorrhea or shortness of breath. He has tried rest and OTC cough suppressant for the symptoms. The treatment provided mild relief.     Review of Systems  Constitutional:  Negative for chills and fever.  HENT:  Positive for ear pain and postnasal drip. Negative for rhinorrhea.   Respiratory:  Positive for cough and wheezing. Negative for shortness of breath.   Musculoskeletal:  Negative for myalgias.  Neurological:  Negative for headaches.  All other systems reviewed and are negative.     Objective:   Physical Exam Vitals reviewed.  Constitutional:      General: He is not in acute distress.    Appearance: He is well-developed. He is obese.  HENT:     Head: Normocephalic.  Eyes:     General:        Right eye: No discharge.        Left eye: No discharge.     Pupils: Pupils are equal, round, and reactive to light.  Neck:     Thyroid: No thyromegaly.  Cardiovascular:     Rate and Rhythm: Normal rate and regular rhythm.     Heart sounds: Normal heart sounds. No murmur heard. Pulmonary:     Effort: Pulmonary effort is normal. No respiratory distress.     Breath sounds: Wheezing and rhonchi present.  Abdominal:     General: Bowel sounds are normal. There is no distension.     Palpations: Abdomen is soft.     Tenderness: There is no abdominal tenderness.  Musculoskeletal:        General: No  tenderness. Normal range of motion.     Cervical back: Normal range of motion and neck supple.  Skin:    General: Skin is warm and dry.     Findings: No erythema or rash.  Neurological:     Mental Status: He is alert and oriented to person, place, and time.     Cranial Nerves: No cranial nerve deficit.     Deep Tendon Reflexes: Reflexes are normal and symmetric.  Psychiatric:        Behavior: Behavior normal.        Thought Content: Thought content normal.        Judgment: Judgment normal.      BP 139/72    Pulse 79    Temp 97.7 F (36.5 C) (Temporal)    Ht 5\' 9"  (1.753 m)    Wt 227 lb 6.4 oz (103.1 kg)    SpO2 95%    BMI 33.58 kg/m      Assessment & Plan:  Ronald Berger comes in today with chief complaint of Cough   Diagnosis and orders addressed:  1. Acute bacterial bronchitis - Take meds as prescribed - Use a cool mist humidifier  -Use saline nose sprays frequently -Force fluids -For any cough or congestion  Use plain Mucinex- regular strength or max strength  is fine -For fever or aces or pains- take tylenol or ibuprofen. -Throat lozenges if help - azithromycin (ZITHROMAX) 250 MG tablet; Take 500 mg once, then 250 mg for four days  Dispense: 6 tablet; Refill: 0 - predniSONE (STERAPRED UNI-PAK 21 TAB) 10 MG (21) TBPK tablet; Use as directed  Dispense: 21 tablet; Refill: 0 - DG Chest 2 View; Future  2. History of COVID-19 - DG Chest 2 View; Future  3. Moderate persistent asthma with exacerbation - DG Chest 2 View; Future   Evelina Dun, FNP

## 2021-03-04 NOTE — Patient Instructions (Signed)
Acute Bronchitis, Adult °Acute bronchitis is sudden inflammation of the main airways (bronchi) that come off the windpipe (trachea) in the lungs. The swelling causes the airways to get smaller and make more mucus than normal. This can make it hard to breathe and can cause coughing or noisy breathing (wheezing). °Acute bronchitis may last several weeks. The cough may last longer. Allergies, asthma, and exposure to smoke may make the condition worse. °What are the causes? °This condition can be caused by germs and by substances that irritate the lungs, including: °Cold and flu viruses. The most common cause of this condition is the virus that causes the common cold. °Bacteria. This is less common. °Breathing in substances that irritate the lungs, including: °Smoke from cigarettes and other forms of tobacco. °Dust and pollen. °Fumes from household cleaning products, gases, or burned fuel. °Indoor or outdoor air pollution. °What increases the risk? °The following factors may make you more likely to develop this condition: °A weak body's defense system, also called the immune system. °A condition that affects your lungs and breathing, such as asthma. °What are the signs or symptoms? °Common symptoms of this condition include: °Coughing. This may bring up clear, yellow, or green mucus from your lungs (sputum). °Wheezing. °Runny or stuffy nose. °Having too much mucus in your lungs (chest congestion). °Shortness of breath. °Aches and pains, including sore throat or chest. °How is this diagnosed? °This condition is usually diagnosed based on: °Your symptoms and medical history. °A physical exam. °You may also have other tests, including tests to rule out other conditions, such as pneumonia. These tests include: °A test of lung function. °Test of a mucus sample to look for the presence of bacteria. °Tests to check the oxygen level in your blood. °Blood tests. °Chest X-ray. °How is this treated? °Most cases of acute bronchitis  clear up over time without treatment. Your health care provider may recommend: °Drinking more fluids to help thin your mucus so it is easier to cough up. °Taking inhaled medicine (inhaler) to improve air flow in and out of your lungs. °Using a vaporizer or a humidifier. These are machines that add water to the air to help you breathe better. °Taking a medicine that thins mucus and clears congestion (expectorant). °Taking a medicine that prevents or stops coughing (cough suppressant). °It is notcommon to take an antibiotic medicine for this condition. °Follow these instructions at home: ° °Take over-the-counter and prescription medicines only as told by your health care provider. °Use an inhaler, vaporizer, or humidifier as told by your health care provider. °Take two teaspoons (10 mL) of honey at bedtime to lessen coughing at night. °Drink enough fluid to keep your urine pale yellow. °Do not use any products that contain nicotine or tobacco. These products include cigarettes, chewing tobacco, and vaping devices, such as e-cigarettes. If you need help quitting, ask your health care provider. °Get plenty of rest. °Return to your normal activities as told by your health care provider. Ask your health care provider what activities are safe for you. °Keep all follow-up visits. This is important. °How is this prevented? °To lower your risk of getting this condition again: °Wash your hands often with soap and water for at least 20 seconds. If soap and water are not available, use hand sanitizer. °Avoid contact with people who have cold symptoms. °Try not to touch your mouth, nose, or eyes with your hands. °Avoid breathing in smoke or chemical fumes. Breathing smoke or chemical fumes will make your condition   worse. °Get the flu shot every year. °Contact a health care provider if: °Your symptoms do not improve after 2 weeks. °You have trouble coughing up the mucus. °Your cough keeps you awake at night. °You have a  fever. °Get help right away if you: °Cough up blood. °Feel pain in your chest. °Have severe shortness of breath. °Faint or keep feeling like you are going to faint. °Have a severe headache. °Have a fever or chills that get worse. °These symptoms may represent a serious problem that is an emergency. Do not wait to see if the symptoms will go away. Get medical help right away. Call your local emergency services (911 in the [place] Do not drive yourself to the hospital. °Summary °Acute bronchitis is inflammation of the main airways (bronchi) that come off the windpipe (trachea) in the lungs. The swelling causes the airways to get smaller and make more mucus than normal. °Drinking more fluids can help thin your mucus so it is easier to cough up. °Take over-the-counter and prescription medicines only as told by your health care provider. °Do not use any products that contain nicotine or tobacco. These products include cigarettes, chewing tobacco, and vaping devices, such as e-cigarettes. If you need help quitting, ask your health care provider. °Contact a health care provider if your symptoms do not improve after 2 weeks. °This information is not intended to replace advice given to you by your health care provider. Make sure you discuss any questions you have with your health care provider. °Document Revised: 05/09/2020 Document Reviewed: 05/09/2020 °Elsevier Patient Education © 2022 [employer] ° °

## 2021-03-05 ENCOUNTER — Ambulatory Visit (INDEPENDENT_AMBULATORY_CARE_PROVIDER_SITE_OTHER): Payer: Medicare HMO

## 2021-03-05 ENCOUNTER — Other Ambulatory Visit: Payer: Medicare HMO

## 2021-03-05 DIAGNOSIS — J4541 Moderate persistent asthma with (acute) exacerbation: Secondary | ICD-10-CM

## 2021-03-05 DIAGNOSIS — B9689 Other specified bacterial agents as the cause of diseases classified elsewhere: Secondary | ICD-10-CM

## 2021-03-05 DIAGNOSIS — J208 Acute bronchitis due to other specified organisms: Secondary | ICD-10-CM

## 2021-03-05 DIAGNOSIS — Z8616 Personal history of COVID-19: Secondary | ICD-10-CM | POA: Diagnosis not present

## 2021-03-05 DIAGNOSIS — R059 Cough, unspecified: Secondary | ICD-10-CM | POA: Diagnosis not present

## 2021-03-05 LAB — CBC WITH DIFFERENTIAL/PLATELET
Basophils Absolute: 0.1 10*3/uL (ref 0.0–0.2)
Basos: 1 %
EOS (ABSOLUTE): 0.2 10*3/uL (ref 0.0–0.4)
Eos: 3 %
Hematocrit: 45.3 % (ref 37.5–51.0)
Hemoglobin: 15.3 g/dL (ref 13.0–17.7)
Immature Grans (Abs): 0 10*3/uL (ref 0.0–0.1)
Immature Granulocytes: 0 %
Lymphocytes Absolute: 2.5 10*3/uL (ref 0.7–3.1)
Lymphs: 30 %
MCH: 29 pg (ref 26.6–33.0)
MCHC: 33.8 g/dL (ref 31.5–35.7)
MCV: 86 fL (ref 79–97)
Monocytes Absolute: 0.9 10*3/uL (ref 0.1–0.9)
Monocytes: 10 %
Neutrophils Absolute: 4.7 10*3/uL (ref 1.4–7.0)
Neutrophils: 56 %
Platelets: 226 10*3/uL (ref 150–450)
RBC: 5.27 x10E6/uL (ref 4.14–5.80)
RDW: 12.5 % (ref 11.6–15.4)
WBC: 8.4 10*3/uL (ref 3.4–10.8)

## 2021-03-05 LAB — CMP14+EGFR
ALT: 28 IU/L (ref 0–44)
AST: 21 IU/L (ref 0–40)
Albumin/Globulin Ratio: 1.7 (ref 1.2–2.2)
Albumin: 4.1 g/dL (ref 3.7–4.7)
Alkaline Phosphatase: 114 IU/L (ref 44–121)
BUN/Creatinine Ratio: 10 (ref 10–24)
BUN: 10 mg/dL (ref 8–27)
Bilirubin Total: 0.4 mg/dL (ref 0.0–1.2)
CO2: 24 mmol/L (ref 20–29)
Calcium: 9.3 mg/dL (ref 8.6–10.2)
Chloride: 104 mmol/L (ref 96–106)
Creatinine, Ser: 0.99 mg/dL (ref 0.76–1.27)
Globulin, Total: 2.4 g/dL (ref 1.5–4.5)
Glucose: 93 mg/dL (ref 70–99)
Potassium: 4 mmol/L (ref 3.5–5.2)
Sodium: 143 mmol/L (ref 134–144)
Total Protein: 6.5 g/dL (ref 6.0–8.5)
eGFR: 80 mL/min/{1.73_m2} (ref >59)

## 2021-03-05 LAB — LIPID PANEL
Chol/HDL Ratio: 2.6 ratio (ref 0.0–5.0)
Cholesterol, Total: 131 mg/dL (ref 100–199)
HDL: 50 mg/dL (ref >39)
LDL Chol Calc (NIH): 57 mg/dL (ref 0–99)
Triglycerides: 142 mg/dL (ref 0–149)
VLDL Cholesterol Cal: 24 mg/dL (ref 5–40)

## 2021-03-05 LAB — PSA, TOTAL AND FREE
PSA, Free Pct: 24.8 %
PSA, Free: 1.64 ng/mL
Prostate Specific Ag, Serum: 6.6 ng/mL — ABNORMAL HIGH (ref 0.0–4.0)

## 2021-03-18 ENCOUNTER — Ambulatory Visit (INDEPENDENT_AMBULATORY_CARE_PROVIDER_SITE_OTHER): Payer: Medicare HMO | Admitting: Family

## 2021-03-18 ENCOUNTER — Encounter: Payer: Self-pay | Admitting: Family

## 2021-03-18 VITALS — BP 132/77 | HR 96 | Temp 97.0°F | Ht 69.0 in | Wt 221.0 lb

## 2021-03-18 DIAGNOSIS — M545 Low back pain, unspecified: Secondary | ICD-10-CM | POA: Diagnosis not present

## 2021-03-18 MED ORDER — DICLOFENAC SODIUM 75 MG PO TBEC: 75.0000 mg | DELAYED_RELEASE_TABLET | Freq: Two times a day (BID) | ORAL | 0 refills | Status: DC

## 2021-03-18 MED ORDER — BACLOFEN 10 MG PO TABS: 10.0000 mg | ORAL_TABLET | Freq: Three times a day (TID) | ORAL | 0 refills | Status: DC

## 2021-03-18 MED ORDER — PREDNISONE 10 MG (21) PO TBPK
ORAL_TABLET | ORAL | 0 refills | Status: DC
Start: 2021-03-18 — End: 2021-03-27

## 2021-03-18 MED ORDER — METHYLPREDNISOLONE ACETATE 80 MG/ML IJ SUSP
80.0000 mg | Freq: Once | INTRAMUSCULAR | Status: AC
  Administered 2021-03-18: 80 mg via INTRAMUSCULAR

## 2021-03-18 MED ORDER — KETOROLAC TROMETHAMINE 60 MG/2ML IM SOLN
60.0000 mg | Freq: Once | INTRAMUSCULAR | Status: AC
  Administered 2021-03-18: 60 mg via INTRAMUSCULAR

## 2021-03-18 NOTE — Patient Instructions (Signed)

## 2021-03-18 NOTE — Progress Notes (Signed)
Subjective:    Patient ID: Ronald Berger, male    DOB: [birth year], 76 y.o.   MRN: [id number]  Chief Complaint  Patient presents with   Back Pain    Injury last Tuesday    Pt presents to the office today with lumbar pain after an injury last week. He states he tried lifting something heavy and since then has had pain.  Back Pain This is a new problem. The current episode started 1 to 4 weeks ago. The problem occurs intermittently. The problem has been waxing and waning since onset. The pain is present in the lumbar spine. The quality of the pain is described as aching. The pain is at a severity of 9/10. The pain is moderate. The symptoms are aggravated by standing and twisting. Pertinent negatives include no chest pain, weakness or weight loss. He has tried bed rest and NSAIDs for the symptoms. The treatment provided mild relief.     Review of Systems  Constitutional:  Negative for weight loss.  Cardiovascular:  Negative for chest pain.  Musculoskeletal:  Positive for back pain.  Neurological:  Negative for weakness.  All other systems reviewed and are negative.     Objective:   Physical Exam Vitals reviewed.  Constitutional:      General: He is not in acute distress.    Appearance: He is well-developed.  HENT:     Head: Normocephalic.  Eyes:     General:        Right eye: No discharge.        Left eye: No discharge.     Pupils: Pupils are equal, round, and reactive to light.  Neck:     Thyroid: No thyromegaly.  Cardiovascular:     Rate and Rhythm: Normal rate and regular rhythm.     Heart sounds: Normal heart sounds. No murmur heard. Pulmonary:     Effort: Pulmonary effort is normal. No respiratory distress.     Breath sounds: Normal breath sounds. No wheezing.  Abdominal:     General: Bowel sounds are normal. There is no distension.     Palpations: Abdomen is soft.     Tenderness: There is no abdominal tenderness.  Musculoskeletal:        General: Tenderness  present. Normal range of motion.       Arms:     Cervical back: Normal range of motion and neck supple.     Comments: Pain in right lumbar with flexion and extension  Skin:    General: Skin is warm and dry.     Findings: No erythema or rash.  Neurological:     Mental Status: He is alert and oriented to person, place, and time.     Cranial Nerves: No cranial nerve deficit.     Deep Tendon Reflexes: Reflexes are normal and symmetric.  Psychiatric:        Behavior: Behavior normal.        Thought Content: Thought content normal.        Judgment: Judgment normal.      BP 132/77    Pulse 96    Temp (!) 97 F (36.1 C) (Temporal)    Ht 5\' 9"  (1.753 m)    Wt 221 lb (100.2 kg)    BMI 32.64 kg/m      Assessment & Plan:  Ronald Berger comes in today with chief complaint of Back Pain (Injury last Tuesday )   Diagnosis and orders addressed:  1. Acute right-sided  low back pain without sciatica Rest ROM exercises  No other NSAID's Sedation precautions  Start prednisone dose pack tomorrow Follow up if symptoms worsen or do not improve  - predniSONE (STERAPRED UNI-PAK 21 TAB) 10 MG (21) TBPK tablet; Use as directed  Dispense: 21 tablet; Refill: 0 - diclofenac (VOLTAREN) 75 MG EC tablet; Take 1 tablet (75 mg total) by mouth 2 (two) times daily.  Dispense: 30 tablet; Refill: 0 - baclofen (LIORESAL) 10 MG tablet; Take 1 tablet (10 mg total) by mouth 3 (three) times daily.  Dispense: 30 each; Refill: 0 - methylPREDNISolone acetate (DEPO-MEDROL) injection 80 mg - ketorolac (TORADOL) injection 60 mg    Evelina Dun, FNP

## 2021-03-19 ENCOUNTER — Ambulatory Visit: Payer: Medicare HMO | Admitting: Family Medicine

## 2021-03-27 ENCOUNTER — Ambulatory Visit (INDEPENDENT_AMBULATORY_CARE_PROVIDER_SITE_OTHER): Payer: Medicare HMO | Admitting: Family Medicine

## 2021-03-27 ENCOUNTER — Encounter: Payer: Self-pay | Admitting: Family Medicine

## 2021-03-27 VITALS — BP 127/88 | HR 54 | Temp 96.9°F | Ht 69.0 in | Wt 230.6 lb

## 2021-03-27 DIAGNOSIS — M545 Low back pain, unspecified: Secondary | ICD-10-CM | POA: Diagnosis not present

## 2021-03-27 MED ORDER — METHOCARBAMOL 500 MG PO TABS: 500.0000 mg | ORAL_TABLET | Freq: Three times a day (TID) | ORAL | 1 refills | Status: DC | PRN

## 2021-03-27 NOTE — Patient Instructions (Signed)
Biofreeze, [place] Gel, or Horse Liniment as a muscle rub. ?Heating pad. ?Continue diclofenac twice daily. ?

## 2021-03-27 NOTE — Progress Notes (Signed)
? ?Assessment & Plan:  ?1. Acute right-sided low back pain without sciatica ?Changed Baclofen to Robaxin. Continue heating pad. Encouraged use of muscle rub and completion of back exercises. Continue diclofenac twice daily. Patient encouraged to take it easy at work since he does not want to miss anymore work.  ?- methocarbamol (ROBAXIN) 500 MG tablet; Take 1 tablet (500 mg total) by mouth every 8 (eight) hours as needed for muscle spasms.  Dispense: 60 tablet; Refill: 1 ? ? ?Follow up plan: Return if symptoms worsen or fail to improve. ? ?Hendricks Limes, MSN, APRN, FNP-C ?[facility] ? ?Subjective:  ? ?Patient ID: Ronald Berger, male    DOB: 1946/01/15, 76 y.o.   MRN: [id number] ? ?HPI: ?Ronald Berger is a 76 y.o. male presenting on 03/27/2021 for Back Pain (Right sided lower back pain. Patient was seen 2/27 and states it got a little better but then got worse again.  Never fully went away.) ? ?Patient reports right sided low back pain that started over two weeks ago. He describes the pain as aching muscle that is cramping really bad. Rates the pain 8-9/10 on average when standing and 0/10 when sitting. He was seen a little over a weak ago at which time he was given Toradol and Depo-Medrol IM in the office and prescribed diclofenac, baclofen, and a prednisone taper to take orally. He has also tried a heating pad and OTC muscle rubs. The heating pad was helpful. No previous imaging. His pain does interfere with his work. He did have to miss work for a week due to the pain. He is not doing physical work such as lifting or pulling.  ? ? ?ROS: Negative unless specifically indicated above in HPI.  ? ?Relevant past medical history reviewed and updated as indicated.  ? ?Allergies and medications reviewed and updated. ? ? ?Current Outpatient Medications:  ?  albuterol (VENTOLIN HFA) 108 (90 Base) MCG/ACT inhaler, Inhale 2 puffs into the lungs every 6 (six) hours as needed for wheezing or  shortness of breath., Disp: 24 each, Rfl: 0 ?  aspirin 81 MG chewable tablet, Chew 1 tablet (81 mg total) by mouth daily., Disp: , Rfl:  ?  atorvastatin (LIPITOR) 40 MG tablet, Take 1 tablet (40 mg total) by mouth daily., Disp: 90 tablet, Rfl: 1 ?  baclofen (LIORESAL) 10 MG tablet, Take 1 tablet (10 mg total) by mouth 3 (three) times daily., Disp: 30 each, Rfl: 0 ?  diclofenac (VOLTAREN) 75 MG EC tablet, Take 1 tablet (75 mg total) by mouth 2 (two) times daily., Disp: 30 tablet, Rfl: 0 ?  EPINEPHRINE 0.3 mg/0.3 mL IJ SOAJ injection, INJECT 0.3 MG INTO THE MUSCLE AS NEEDED FOR ANAPHYLAXIS., Disp: 2 each, Rfl: 2 ?  fluticasone (FLONASE) 50 MCG/ACT nasal spray, USE 2 SPRAYS IN EACH NOSTRIL EVERY DAY, Disp: 48 g, Rfl: 1 ?  fluticasone-salmeterol (ADVAIR) 250-50 MCG/ACT AEPB, Inhale 1 puff into the lungs in the morning and at bedtime., Disp: 60 each, Rfl: 5 ?  ibuprofen (ADVIL) 200 MG tablet, Take 200 mg by mouth every 6 (six) hours as needed., Disp: , Rfl:  ?  levocetirizine (XYZAL) 5 MG tablet, Take 1 tablet (5 mg total) by mouth every evening., Disp: 90 tablet, Rfl: 1 ?  Melatonin 10 MG TABS, Take 10 mg by mouth at bedtime., Disp: , Rfl:  ?  metoprolol tartrate (LOPRESSOR) 25 MG tablet, Take 0.5 tablets (12.5 mg total) by mouth 2 (two) times daily., Disp: 90  tablet, Rfl: 3 ?  montelukast (SINGULAIR) 10 MG tablet, Take 1 tablet (10 mg total) by mouth at bedtime., Disp: 90 tablet, Rfl: 1 ?  Multiple Vitamins-Minerals (EMERGEN-C BLUE) PACK, Take 1 each by mouth daily., Disp: , Rfl:  ?  nitroGLYCERIN (NITROSTAT) 0.4 MG SL tablet, Place 1 tablet (0.4 mg total) under the tongue every 5 (five) minutes as needed for chest pain., Disp: 25 tablet, Rfl: 2 ?  omeprazole (PRILOSEC) 20 MG capsule, Take 1 capsule (20 mg total) by mouth every evening., Disp: 90 capsule, Rfl: 3 ?  silodosin (RAPAFLO) 8 MG CAPS capsule, Take 1 capsule (8 mg total) by mouth daily with breakfast., Disp: 30 capsule, Rfl: 11 ?  traZODone (DESYREL) 50 MG  tablet, Take 1 tablet (50 mg total) by mouth at bedtime., Disp: 90 tablet, Rfl: 1 ?  tamsulosin (FLOMAX) 0.4 MG CAPS capsule, Take 1 capsule (0.4 mg total) by mouth daily., Disp: 90 capsule, Rfl: 1 ? ?Allergies  ?Allergen Reactions  ? Bee Venom Shortness Of Breath  ? Oxycodone Nausea And Vomiting  ? Percocet [Oxycodone-Acetaminophen] Nausea And Vomiting  ? ? ?Objective:  ? ?BP 127/88   Pulse (!) 54   Temp (!) 96.9 ?F (36.1 ?C) (Temporal)   Ht '5\' 9"'$  (1.753 m)   Wt 230 lb 9.6 oz (104.6 kg)   BMI 34.05 kg/m?   ? ?Physical Exam ?Vitals reviewed.  ?Constitutional:   ?   General: He is not in acute distress. ?   Appearance: Normal appearance. He is not ill-appearing, toxic-appearing or diaphoretic.  ?HENT:  ?   Head: Normocephalic and atraumatic.  ?Eyes:  ?   General: No scleral icterus.    ?   Right eye: No discharge.     ?   Left eye: No discharge.  ?   Conjunctiva/sclera: Conjunctivae normal.  ?Cardiovascular:  ?   Rate and Rhythm: Normal rate.  ?Pulmonary:  ?   Effort: Pulmonary effort is normal. No respiratory distress.  ?Musculoskeletal:     ?   General: Normal range of motion.  ?   Cervical back: Normal range of motion.  ?   Lumbar back: Tenderness (right sided) present. No swelling, edema, deformity, signs of trauma, lacerations, spasms or bony tenderness. Normal range of motion. No scoliosis.  ?Skin: ?   General: Skin is warm and dry.  ?Neurological:  ?   Mental Status: He is alert and oriented to person, place, and time. Mental status is at baseline.  ?Psychiatric:     ?   Mood and Affect: Mood normal.     ?   Behavior: Behavior normal.     ?   Thought Content: Thought content normal.     ?   Judgment: Judgment normal.  ? ? ? ? ? ? ?

## 2021-04-03 ENCOUNTER — Ambulatory Visit (INDEPENDENT_AMBULATORY_CARE_PROVIDER_SITE_OTHER): Payer: Medicare HMO

## 2021-04-03 VITALS — Wt 210.0 lb

## 2021-04-03 DIAGNOSIS — Z0000 Encounter for general adult medical examination without abnormal findings: Secondary | ICD-10-CM | POA: Diagnosis not present

## 2021-04-03 NOTE — Progress Notes (Signed)
? ?Subjective:  ? Ronald Berger is a 76 y.o. male who presents for Medicare Annual/Subsequent preventive examination. ? ?Virtual Visit via Telephone Note ? ?I connected with  Ronald Berger on 04/03/21 at 11:15 AM EDT by telephone and verified that I am speaking with the correct person using two identifiers. ? ?Location: ?Patient: Home ?Provider: [facility] ?Persons participating in the virtual visit: patient/Nurse Health Advisor ?  ?I discussed the limitations, risks, security and privacy concerns of performing an evaluation and management service by telephone and the availability of in person appointments. The patient expressed understanding and agreed to proceed. ? ?Interactive audio and video telecommunications were attempted between this nurse and patient, however failed, due to patient having technical difficulties OR patient did not have access to video capability.  We continued and completed visit with audio only. ? ?Some vital signs may be absent or patient reported.  ? ?Ronald Berger Ano, LPN  ? ?Review of Systems    ? ?Cardiac Risk Factors include: advanced age (>74mn, >>86women);obesity (BMI >30kg/m2);male gender;Other (see comment), Risk factor comments: CAD, OSA no CPAP, hx of MI, pre-diabetes ? ?   ?Objective:  ?  ?Today's Vitals  ? 04/03/21 1105  ?Weight: 210 lb (95.3 kg)  ?PainSc: 5   ? ?Body mass index is 31.01 kg/m?. ? ?Advanced Directives 04/03/2021 04/02/2020 02/21/2020 04/01/2019 04/01/2019 05/02/2015 02/14/2014  ?Does Patient Have a Medical Advance Directive? No Yes No Yes No No No  ?Type of Advance Directive - Living will - [place] - - -  ?Does patient want to make changes to medical advance directive? - No - Patient declined - No - Patient declined - - -  ?Copy of [place]? - - - No - copy requested - - -  ?Would patient like information on creating a medical advance directive? Yes (MAU/Ambulatory/Procedural Areas - Information given) - No  - Patient declined No - Patient declined Yes (MAU/Ambulatory/Procedural Areas - Information given) Yes - Educational materials given No - patient declined information  ?Pre-existing out of facility DNR order (yellow form or pink MOST form) - - - - - - -  ? ? ?Current Medications (verified) ?Outpatient Encounter Medications as of 04/03/2021  ?Medication Sig  ? albuterol (VENTOLIN HFA) 108 (90 Base) MCG/ACT inhaler Inhale 2 puffs into the lungs every 6 (six) hours as needed for wheezing or shortness of breath.  ? aspirin 81 MG chewable tablet Chew 1 tablet (81 mg total) by mouth daily.  ? atorvastatin (LIPITOR) 40 MG tablet Take 1 tablet (40 mg total) by mouth daily.  ? diclofenac (VOLTAREN) 75 MG EC tablet Take 1 tablet (75 mg total) by mouth 2 (two) times daily.  ? EPINEPHRINE 0.3 mg/0.3 mL IJ SOAJ injection INJECT 0.3 MG INTO THE MUSCLE AS NEEDED FOR ANAPHYLAXIS.  ? fluticasone (FLONASE) 50 MCG/ACT nasal spray USE 2 SPRAYS IN EACH NOSTRIL EVERY DAY  ? fluticasone-salmeterol (ADVAIR) 250-50 MCG/ACT AEPB Inhale 1 puff into the lungs in the morning and at bedtime.  ? ibuprofen (ADVIL) 200 MG tablet Take 200 mg by mouth every 6 (six) hours as needed.  ? levocetirizine (XYZAL) 5 MG tablet Take 1 tablet (5 mg total) by mouth every evening.  ? Melatonin 10 MG TABS Take 10 mg by mouth at bedtime.  ? methocarbamol (ROBAXIN) 500 MG tablet Take 1 tablet (500 mg total) by mouth every 8 (eight) hours as needed for muscle spasms.  ? metoprolol tartrate (LOPRESSOR) 25  MG tablet Take 0.5 tablets (12.5 mg total) by mouth 2 (two) times daily.  ? montelukast (SINGULAIR) 10 MG tablet Take 1 tablet (10 mg total) by mouth at bedtime.  ? Multiple Vitamins-Minerals (EMERGEN-C BLUE) PACK Take 1 each by mouth daily.  ? nitroGLYCERIN (NITROSTAT) 0.4 MG SL tablet Place 1 tablet (0.4 mg total) under the tongue every 5 (five) minutes as needed for chest pain.  ? omeprazole (PRILOSEC) 20 MG capsule Take 1 capsule (20 mg total) by mouth every  evening.  ? silodosin (RAPAFLO) 8 MG CAPS capsule Take 1 capsule (8 mg total) by mouth daily with breakfast.  ? traZODone (DESYREL) 50 MG tablet Take 1 tablet (50 mg total) by mouth at bedtime.  ? ?No facility-administered encounter medications on file as of 04/03/2021.  ? ? ?Allergies (verified) ?Bee venom, Oxycodone, and Percocet [oxycodone-acetaminophen]  ? ?History: ?Past Medical History:  ?Diagnosis Date  ? Allergic rhinitis   ? Allergy   ? Asthma due to environmental allergies   ? 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  ? Bilateral carpal tunnel syndrome 12/08/2018  ? CAD, multiple vessel CARDIOLOGIST-  DR HILTY  ? a. NSTEMI 07/2013 - DES to mLCx, DES to prox OM1, DES to mLAD with nonobstructive RCA stenosis, EF 55-65%.  ? Chronic kidney disease   ? kidney stones   ? Dyslipidemia   ? Family history of bone cancer   ? GERD (gastroesophageal reflux disease)   ? History of kidney stones   ? History of non-ST elevation myocardial infarction (NSTEMI)   ? 07/ 2015  S/P  DES X3  ? History of palpitations   ? Hyperlipidemia   ? Hypertension   ? Insomnia 12/08/2018  ? x many years  ? Myocardial infarction [facility]) 2015  ? NSTEMI- 3 stents   ? OSA (obstructive sleep apnea)   ? cpap intolerant  ? Pneumonia due to COVID-19 virus   ? Right knee meniscal tear   ? S/P drug eluting coronary stent placement   ? 07/ 2015  x3  to mLCFX, OM1, mLAD  ? Sinus bradycardia   ? Sleep apnea   ? no cpap   ? ?Past Surgical History:  ?Procedure Laterality Date  ? CATARACT EXTRACTION, BILATERAL    ? COLONOSCOPY    ? >10 yrs ago-polyps per pt- we have no report   ? EXTRACORPOREAL SHOCK WAVE LITHOTRIPSY  x2 last one 2005  ? KNEE ARTHROSCOPY Right 05/02/2015  ? Procedure: RIGHT KNEE ARTHROSCOPY WITH medial meniscal DEBRIDEMENT ;  Surgeon: Gaynelle Arabian, MD;  Location: [place];  Service: Orthopedics;  Laterality: Right;  ? LEFT HEART CATHETERIZATION WITH CORONARY ANGIOGRAM N/A 08/05/2013  ?  Procedure: LEFT HEART CATHETERIZATION WITH CORONARY ANGIOGRAM;  Surgeon: Blane Ohara, MD;  Location: [facility] CATH LAB;  Service: Cardiovascular;  Laterality: N/A;  Promus DES's to  mLCFx,  OM1,  mLAD (total 3)/   mRCA 30%,  dLM  20-30%,  preserved LVSF, ef 55-65%  ? LUMBAR SPINE SURGERY  x3  last one 1980's  ? SHOULDER ARTHROSCOPY Right 2013  ? TONSILLECTOMY    ? ?Family History  ?Problem Relation Age of Onset  ? Coronary artery disease Mother   ?     CABG  ? CAD Sister   ? Bone cancer Sister   ? Alzheimer's disease Father   ? Aneurysm Brother   ?     brain  ? Heart failure Sister   ?  Bone cancer Sister   ? Colon cancer Neg Hx   ? Colon polyps Neg Hx   ? Esophageal cancer Neg Hx   ? Rectal cancer Neg Hx   ? Stomach cancer Neg Hx   ? ?Social History  ? ?Socioeconomic History  ? Marital status: Married  ?  Spouse name: Joelene Millin  ? Number of children: 1  ? Years of education: 40  ? Highest education level: Some college, no degree  ?Occupational History  ? Occupation: [employer]  ?  Comment: Retired - prior Oceanographer  ?Tobacco Use  ? Smoking status: Former  ?  Years: 1.00  ?  Types: Cigarettes  ?  Quit date: 04/27/1966  ?  Years since quitting: 54.9  ? Smokeless tobacco: Never  ?Vaping Use  ? Vaping Use: Never used  ?Substance and Sexual Activity  ? Alcohol use: Yes  ?  Comment: occasional  ? Drug use: No  ? Sexual activity: Yes  ?Other Topics Concern  ? Not on file  ?Social History Narrative  ? Son lives with them  ? One level home  ? ?Social Determinants of Health  ? ?Financial Resource Strain: Low Risk   ? Difficulty of Paying Living Expenses: Not hard at all  ?Food Insecurity: No Food Insecurity  ? Worried About Charity fundraiser in the Last Year: Never true  ? Ran Out of Food in the Last Year: Never true  ?Transportation Needs: No Transportation Needs  ? Lack of Transportation (Medical): No  ? Lack of Transportation (Non-Medical): No  ?Physical Activity: Sufficiently Active  ? Days of Exercise per  Week: 5 days  ? Minutes of Exercise per Session: 60 min  ?Stress: No Stress Concern Present  ? Feeling of Stress : Not at all  ?Social Connections: Moderately Isolated  ? Frequency of Communication with F

## 2021-04-03 NOTE — Patient Instructions (Signed)
Mr. Herbers , ?Thank you for taking time to come for your Medicare Wellness Visit. I appreciate your ongoing commitment to your health goals. Please review the following plan we discussed and let me know if I can assist you in the future.  ? ?Screening recommendations/referrals: ?Colonoscopy: Done 02/10/2019 - Repeat in one year ** Call and reschedule this soon! ?Recommended yearly ophthalmology/optometry visit for glaucoma screening and checkup ?Recommended yearly dental visit for hygiene and checkup ? ?Vaccinations: ?Influenza vaccine: Done 12/04/2020 - Repeat annually ?Pneumococcal vaccine: Done 08/06/2013 & 02/23/2015 - ask about [id number] ?Tdap vaccine: Done 06/04/2019 - Repeat in 10 years ?Shingles vaccine: Due - consider getting this at your next visit - Shingrix is 2 doses 2-6 months apart and over 90% effective     ?Covid-19: Done 03/28/19, 04/25/19, 05/01/20, & 12/04/20 ? ?Advanced directives: Advance directive discussed with you today. I have provided a copy for you to complete at home and have notarized. Once this is complete please bring a copy in to our office so we can scan it into your chart.  ? ?Conditions/risks identified: Keep up the great work!  ? ?Next appointment: Follow up in one year for your annual wellness visit.  ? ?Preventive Care 30 Years and Older, Male ? ?Preventive care refers to lifestyle choices and visits with your health care provider that can promote health and wellness. ?What does preventive care include? ?A yearly physical exam. This is also called an annual well check. ?Dental exams once or twice a year. ?Routine eye exams. Ask your health care provider how often you should have your eyes checked. ?Personal lifestyle choices, including: ?Daily care of your teeth and gums. ?Regular physical activity. ?Eating a healthy diet. ?Avoiding tobacco and drug use. ?Limiting alcohol use. ?Practicing safe sex. ?Taking low doses of aspirin every day. ?Taking vitamin and mineral supplements as  recommended by your health care provider. ?What happens during an annual well check? ?The services and screenings done by your health care provider during your annual well check will depend on your age, overall health, lifestyle risk factors, and family history of disease. ?Counseling  ?Your health care provider may ask you questions about your: ?Alcohol use. ?Tobacco use. ?Drug use. ?Emotional well-being. ?Home and relationship well-being. ?Sexual activity. ?Eating habits. ?History of falls. ?Memory and ability to understand (cognition). ?Work and work Statistician. ?Screening  ?You may have the following tests or measurements: ?Height, weight, and BMI. ?Blood pressure. ?Lipid and cholesterol levels. These may be checked every 5 years, or more frequently if you are over 36 years old. ?Skin check. ?Lung cancer screening. You may have this screening every year starting at age 17 if you have a 30-pack-year history of smoking and currently smoke or have quit within the past 15 years. ?Fecal occult blood test (FOBT) of the stool. You may have this test every year starting at age 85. ?Flexible sigmoidoscopy or colonoscopy. You may have a sigmoidoscopy every 5 years or a colonoscopy every 10 years starting at age 52. ?Prostate cancer screening. Recommendations will vary depending on your family history and other risks. ?Hepatitis C blood test. ?Hepatitis B blood test. ?Sexually transmitted disease (STD) testing. ?Diabetes screening. This is done by checking your blood sugar (glucose) after you have not eaten for a while (fasting). You may have this done every 1-3 years. ?Abdominal aortic aneurysm (AAA) screening. You may need this if you are a current or former smoker. ?Osteoporosis. You may be screened starting at age 6 if you are at  high risk. ?Talk with your health care provider about your test results, treatment options, and if necessary, the need for more tests. ?Vaccines  ?Your health care provider may recommend  certain vaccines, such as: ?Influenza vaccine. This is recommended every year. ?Tetanus, diphtheria, and acellular pertussis (Tdap, Td) vaccine. You may need a Td booster every 10 years. ?Zoster vaccine. You may need this after age 11. ?Pneumococcal 13-valent conjugate (PCV13) vaccine. One dose is recommended after age 68. ?Pneumococcal polysaccharide (PPSV23) vaccine. One dose is recommended after age 51. ?Talk to your health care provider about which screenings and vaccines you need and how often you need them. ?This information is not intended to replace advice given to you by your health care provider. Make sure you discuss any questions you have with your health care provider. ?Document Released: 02/02/2015 Document Revised: 09/26/2015 Document Reviewed: 11/07/2014 ?Elsevier Interactive Patient Education ? 2017 [employer]. ? ?Fall Prevention in the Home ?Falls can cause injuries. They can happen to people of all ages. There are many things you can do to make your home safe and to help prevent falls. ?What can I do on the outside of my home? ?Regularly fix the edges of walkways and driveways and fix any cracks. ?Remove anything that might make you trip as you walk through a door, such as a raised step or threshold. ?Trim any bushes or trees on the path to your home. ?Use bright outdoor lighting. ?Clear any walking paths of anything that might make someone trip, such as rocks or tools. ?Regularly check to see if handrails are loose or broken. Make sure that both sides of any steps have handrails. ?Any raised decks and porches should have guardrails on the edges. ?Have any leaves, snow, or ice cleared regularly. ?Use sand or salt on walking paths during winter. ?Clean up any spills in your garage right away. This includes oil or grease spills. ?What can I do in the bathroom? ?Use night lights. ?Install grab bars by the toilet and in the tub and shower. Do not use towel bars as grab bars. ?Use non-skid mats or  decals in the tub or shower. ?If you need to sit down in the shower, use a plastic, non-slip stool. ?Keep the floor dry. Clean up any water that spills on the floor as soon as it happens. ?Remove soap buildup in the tub or shower regularly. ?Attach bath mats securely with double-sided non-slip rug tape. ?Do not have throw rugs and other things on the floor that can make you trip. ?What can I do in the bedroom? ?Use night lights. ?Make sure that you have a light by your bed that is easy to reach. ?Do not use any sheets or blankets that are too big for your bed. They should not hang down onto the floor. ?Have a firm chair that has side arms. You can use this for support while you get dressed. ?Do not have throw rugs and other things on the floor that can make you trip. ?What can I do in the kitchen? ?Clean up any spills right away. ?Avoid walking on wet floors. ?Keep items that you use a lot in easy-to-reach places. ?If you need to reach something above you, use a strong step stool that has a grab bar. ?Keep electrical cords out of the way. ?Do not use floor polish or wax that makes floors slippery. If you must use wax, use non-skid floor wax. ?Do not have throw rugs and other things on the floor that  can make you trip. ?What can I do with my stairs? ?Do not leave any items on the stairs. ?Make sure that there are handrails on both sides of the stairs and use them. Fix handrails that are broken or loose. Make sure that handrails are as long as the stairways. ?Check any carpeting to make sure that it is firmly attached to the stairs. Fix any carpet that is loose or worn. ?Avoid having throw rugs at the top or bottom of the stairs. If you do have throw rugs, attach them to the floor with carpet tape. ?Make sure that you have a light switch at the top of the stairs and the bottom of the stairs. If you do not have them, ask someone to add them for you. ?What else can I do to help prevent falls? ?Wear shoes that: ?Do not  have high heels. ?Have rubber bottoms. ?Are comfortable and fit you well. ?Are closed at the toe. Do not wear sandals. ?If you use a stepladder: ?Make sure that it is fully opened. Do not climb a closed stepladd

## 2021-04-04 ENCOUNTER — Ambulatory Visit (INDEPENDENT_AMBULATORY_CARE_PROVIDER_SITE_OTHER): Payer: Medicare HMO | Admitting: Family Medicine

## 2021-04-04 ENCOUNTER — Encounter: Payer: Self-pay | Admitting: Family Medicine

## 2021-04-04 VITALS — BP 134/73 | HR 57 | Temp 96.9°F | Ht 69.0 in | Wt 226.4 lb

## 2021-04-04 DIAGNOSIS — M25512 Pain in left shoulder: Secondary | ICD-10-CM | POA: Diagnosis not present

## 2021-04-04 MED ORDER — DICLOFENAC SODIUM 75 MG PO TBEC: 75.0000 mg | DELAYED_RELEASE_TABLET | Freq: Two times a day (BID) | ORAL | 1 refills | Status: DC

## 2021-04-04 NOTE — Progress Notes (Signed)
? ?Assessment & Plan:  ?1. Acute pain of left shoulder ?Exercises provided for patient to complete at home. Continue diclofenac twice daily. Encouraged use of muscle rub.  ?- diclofenac (VOLTAREN) 75 MG EC tablet; Take 1 tablet (75 mg total) by mouth 2 (two) times daily.  Dispense: 60 tablet; Refill: 1 ? ? ?Follow up plan: Return if symptoms worsen or fail to improve. ? ?Hendricks Limes, MSN, APRN, FNP-C ?[place] ? ?Subjective:  ? ?Patient ID: Ronald Berger, male    DOB: 08-25-1945, 76 y.o.   MRN: [id number] ? ?HPI: ?SOTA HETZ is a 76 y.o. male presenting on 04/04/2021 for Shoulder Pain (Left x 3 days. Trouble moving his arm) ? ?Patient complaints of left shoulder pain. Denies any injury. States he went to bed Sunday night and woke up with it sore on Monday. States he has been unable to move it since. The pain is described as aching.  The pain occurs continuously.  Location is anterior. No history of dislocation. Symptoms are aggravated by all activities. Symptoms are diminished by holding the arm in a particular spot.   Limited activities include: all activities. He has been taking diclofenac orally and applying Voltaren gel which are somewhat helpful.  ? ? ?ROS: Negative unless specifically indicated above in HPI.  ? ?Relevant past medical history reviewed and updated as indicated.  ? ?Allergies and medications reviewed and updated. ? ? ?Current Outpatient Medications:  ?  albuterol (VENTOLIN HFA) 108 (90 Base) MCG/ACT inhaler, Inhale 2 puffs into the lungs every 6 (six) hours as needed for wheezing or shortness of breath., Disp: 24 each, Rfl: 0 ?  aspirin 81 MG chewable tablet, Chew 1 tablet (81 mg total) by mouth daily., Disp: , Rfl:  ?  atorvastatin (LIPITOR) 40 MG tablet, Take 1 tablet (40 mg total) by mouth daily., Disp: 90 tablet, Rfl: 1 ?  diclofenac (VOLTAREN) 75 MG EC tablet, Take 1 tablet (75 mg total) by mouth 2 (two) times daily., Disp: 30 tablet, Rfl: 0 ?   EPINEPHRINE 0.3 mg/0.3 mL IJ SOAJ injection, INJECT 0.3 MG INTO THE MUSCLE AS NEEDED FOR ANAPHYLAXIS., Disp: 2 each, Rfl: 2 ?  fluticasone (FLONASE) 50 MCG/ACT nasal spray, USE 2 SPRAYS IN EACH NOSTRIL EVERY DAY, Disp: 48 g, Rfl: 1 ?  fluticasone-salmeterol (ADVAIR) 250-50 MCG/ACT AEPB, Inhale 1 puff into the lungs in the morning and at bedtime., Disp: 60 each, Rfl: 5 ?  ibuprofen (ADVIL) 200 MG tablet, Take 200 mg by mouth every 6 (six) hours as needed., Disp: , Rfl:  ?  levocetirizine (XYZAL) 5 MG tablet, Take 1 tablet (5 mg total) by mouth every evening., Disp: 90 tablet, Rfl: 1 ?  Melatonin 10 MG TABS, Take 10 mg by mouth at bedtime., Disp: , Rfl:  ?  methocarbamol (ROBAXIN) 500 MG tablet, Take 1 tablet (500 mg total) by mouth every 8 (eight) hours as needed for muscle spasms., Disp: 60 tablet, Rfl: 1 ?  metoprolol tartrate (LOPRESSOR) 25 MG tablet, Take 0.5 tablets (12.5 mg total) by mouth 2 (two) times daily., Disp: 90 tablet, Rfl: 3 ?  montelukast (SINGULAIR) 10 MG tablet, Take 1 tablet (10 mg total) by mouth at bedtime., Disp: 90 tablet, Rfl: 1 ?  Multiple Vitamins-Minerals (EMERGEN-C BLUE) PACK, Take 1 each by mouth daily., Disp: , Rfl:  ?  nitroGLYCERIN (NITROSTAT) 0.4 MG SL tablet, Place 1 tablet (0.4 mg total) under the tongue every 5 (five) minutes as needed for chest pain., Disp: 25  tablet, Rfl: 2 ?  omeprazole (PRILOSEC) 20 MG capsule, Take 1 capsule (20 mg total) by mouth every evening., Disp: 90 capsule, Rfl: 3 ?  silodosin (RAPAFLO) 8 MG CAPS capsule, Take 1 capsule (8 mg total) by mouth daily with breakfast., Disp: 30 capsule, Rfl: 11 ?  traZODone (DESYREL) 50 MG tablet, Take 1 tablet (50 mg total) by mouth at bedtime., Disp: 90 tablet, Rfl: 1 ? ?Allergies  ?Allergen Reactions  ? Bee Venom Shortness Of Breath  ? Oxycodone Nausea And Vomiting  ? Percocet [Oxycodone-Acetaminophen] Nausea And Vomiting  ? ? ?Objective:  ? ?BP 134/73   Pulse (!) 57   Temp (!) 96.9 ?F (36.1 ?C) (Temporal)   Ht '5\' 9"'$   (1.753 m)   Wt 226 lb 6.4 oz (102.7 kg)   SpO2 97%   BMI 33.43 kg/m?   ? ?Physical Exam ?Vitals reviewed.  ?Constitutional:   ?   General: He is not in acute distress. ?   Appearance: Normal appearance. He is not ill-appearing, toxic-appearing or diaphoretic.  ?HENT:  ?   Head: Normocephalic and atraumatic.  ?Eyes:  ?   General: No scleral icterus.    ?   Right eye: No discharge.     ?   Left eye: No discharge.  ?   Conjunctiva/sclera: Conjunctivae normal.  ?Cardiovascular:  ?   Rate and Rhythm: Normal rate.  ?Pulmonary:  ?   Effort: Pulmonary effort is normal. No respiratory distress.  ?Musculoskeletal:  ?   Left shoulder: Tenderness (anteriorly) present. No swelling, deformity, effusion, laceration, bony tenderness or crepitus. Decreased range of motion. Normal strength. Normal pulse.  ?   Cervical back: Normal range of motion.  ?Skin: ?   General: Skin is warm and dry.  ?Neurological:  ?   Mental Status: He is alert and oriented to person, place, and time. Mental status is at baseline.  ?Psychiatric:     ?   Mood and Affect: Mood normal.     ?   Behavior: Behavior normal.     ?   Thought Content: Thought content normal.     ?   Judgment: Judgment normal.  ? ? ? ? ? ? ?

## 2021-04-06 ENCOUNTER — Other Ambulatory Visit: Payer: Self-pay | Admitting: Family Medicine

## 2021-04-06 DIAGNOSIS — J4540 Moderate persistent asthma, uncomplicated: Secondary | ICD-10-CM

## 2021-04-16 ENCOUNTER — Ambulatory Visit (INDEPENDENT_AMBULATORY_CARE_PROVIDER_SITE_OTHER): Payer: Medicare HMO | Admitting: *Deleted

## 2021-04-16 DIAGNOSIS — Z23 Encounter for immunization: Secondary | ICD-10-CM

## 2021-04-19 ENCOUNTER — Ambulatory Visit (INDEPENDENT_AMBULATORY_CARE_PROVIDER_SITE_OTHER): Payer: Medicare HMO | Admitting: Urology

## 2021-04-19 VITALS — BP 149/66 | HR 66

## 2021-04-19 DIAGNOSIS — N138 Other obstructive and reflux uropathy: Secondary | ICD-10-CM | POA: Diagnosis not present

## 2021-04-19 DIAGNOSIS — N401 Enlarged prostate with lower urinary tract symptoms: Secondary | ICD-10-CM

## 2021-04-19 DIAGNOSIS — R351 Nocturia: Secondary | ICD-10-CM

## 2021-04-19 DIAGNOSIS — R972 Elevated prostate specific antigen [PSA]: Secondary | ICD-10-CM

## 2021-04-19 LAB — URINALYSIS, ROUTINE W REFLEX MICROSCOPIC
Bilirubin, UA: NEGATIVE
Glucose, UA: NEGATIVE
Ketones, UA: NEGATIVE
Leukocytes,UA: NEGATIVE
Nitrite, UA: NEGATIVE
Protein,UA: NEGATIVE
Specific Gravity, UA: >1.03 — ABNORMAL HIGH (ref 1.005–1.030)
Urobilinogen, Ur: 0.2 mg/dL (ref 0.2–1.0)
pH, UA: 5.5 (ref 5.0–7.5)

## 2021-04-19 LAB — MICROSCOPIC EXAMINATION
Bacteria, UA: NONE SEEN
Epithelial Cells (non renal): NONE SEEN /hpf (ref 0–10)
Renal Epithel, UA: NONE SEEN /hpf
WBC, UA: NONE SEEN /hpf (ref 0–5)

## 2021-04-19 LAB — BLADDER SCAN AMB NON-IMAGING: Scan Result: 50

## 2021-04-19 MED ORDER — SILODOSIN 8 MG PO CAPS: 8.0000 mg | ORAL_CAPSULE | Freq: Every day | ORAL | 11 refills | Status: DC

## 2021-04-19 NOTE — Progress Notes (Signed)
post void residual =50 

## 2021-04-19 NOTE — Progress Notes (Signed)
? ?04/19/2021 ?12:22 PM  ? ?Ronald Berger ?1945/02/08 ?[id number] ? ?Referring provider: Loman Brooklyn, FNP ?[address] [place],  [place] [address] ? ?Followup BPH and elevated ? ? ?HPI: ?Ronald Berger is a 76yo here for followup for BPH and elevated PSA. PSA increased to 6.6 from 6.5. He has stable moderate LUTS on rapaflo '8mg'$  qhs. IPSS 15 QOL 2. Urine stream strong. Nocturia 2-3x. No urinary hesitancy.  ? ? ?PMH: ?Past Medical History:  ?Diagnosis Date  ? Allergic rhinitis   ? Allergy   ? Asthma due to environmental allergies   ? 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  ? Bilateral carpal tunnel syndrome 12/08/2018  ? CAD, multiple vessel CARDIOLOGIST-  DR HILTY  ? a. NSTEMI 07/2013 - DES to mLCx, DES to prox OM1, DES to mLAD with nonobstructive RCA stenosis, EF 55-65%.  ? Chronic kidney disease   ? kidney stones   ? Dyslipidemia   ? Family history of bone cancer   ? GERD (gastroesophageal reflux disease)   ? History of kidney stones   ? History of non-ST elevation myocardial infarction (NSTEMI)   ? 07/ 2015  S/P  DES X3  ? History of palpitations   ? Hyperlipidemia   ? Hypertension   ? Insomnia 12/08/2018  ? x many years  ? Myocardial infarction [facility]) 2015  ? NSTEMI- 3 stents   ? OSA (obstructive sleep apnea)   ? cpap intolerant  ? Pneumonia due to COVID-19 virus   ? Right knee meniscal tear   ? S/P drug eluting coronary stent placement   ? 07/ 2015  x3  to mLCFX, OM1, mLAD  ? Sinus bradycardia   ? Sleep apnea   ? no cpap   ? ? ?Surgical History: ?Past Surgical History:  ?Procedure Laterality Date  ? CATARACT EXTRACTION, BILATERAL    ? COLONOSCOPY    ? >10 yrs ago-polyps per pt- we have no report   ? EXTRACORPOREAL SHOCK WAVE LITHOTRIPSY  x2 last one 2005  ? KNEE ARTHROSCOPY Right 05/02/2015  ? Procedure: RIGHT KNEE ARTHROSCOPY WITH medial meniscal DEBRIDEMENT ;  Surgeon: Gaynelle Arabian, MD;  Location: [facility];  Service: Orthopedics;  Laterality: Right;   ? LEFT HEART CATHETERIZATION WITH CORONARY ANGIOGRAM N/A 08/05/2013  ? Procedure: LEFT HEART CATHETERIZATION WITH CORONARY ANGIOGRAM;  Surgeon: Blane Ohara, MD;  Location: [facility];  Service: Cardiovascular;  Laterality: N/A;  Promus DES's to  mLCFx,  OM1,  mLAD (total 3)/   mRCA 30%,  dLM  20-30%,  preserved LVSF, ef 55-65%  ? LUMBAR SPINE SURGERY  x3  last one 1980's  ? SHOULDER ARTHROSCOPY Right 2013  ? TONSILLECTOMY    ? ? ?Home Medications:  ?Allergies as of 04/19/2021   ? ?   Reactions  ? Bee Venom Shortness Of Breath  ? Oxycodone Nausea And Vomiting  ? Percocet [oxycodone-acetaminophen] Nausea And Vomiting  ? ?  ? ?  ?Medication List  ?  ? ?  ? Accurate as of April 19, 2021 12:22 PM. If you have any questions, ask your nurse or doctor.  ?  ?  ? ?  ? ?albuterol 108 (90 Base) MCG/ACT inhaler ?Commonly known as: VENTOLIN HFA ?INHALE 2 PUFFS INTO THE LUNGS EVERY 6  HOURS AS NEEDED FOR WHEEZING OR SHORTNESS OF BREATH. ?  ?aspirin 81 MG chewable tablet ?Chew 1 tablet (81 mg total) by mouth daily. ?  ?  atorvastatin 40 MG tablet ?Commonly known as: LIPITOR ?Take 1 tablet (40 mg total) by mouth daily. ?  ?diclofenac 75 MG EC tablet ?Commonly known as: VOLTAREN ?Take 1 tablet (75 mg total) by mouth 2 (two) times daily. ?  ?Emergen-C Blue Pack ?Take 1 each by mouth daily. ?  ?EPINEPHrine 0.3 mg/0.3 mL Soaj injection ?Commonly known as: EPI-PEN ?INJECT 0.3 MG INTO THE MUSCLE AS NEEDED FOR ANAPHYLAXIS. ?  ?fluticasone 50 MCG/ACT nasal spray ?Commonly known as: FLONASE ?USE 2 SPRAYS IN EACH NOSTRIL EVERY DAY ?  ?fluticasone-salmeterol 250-50 MCG/ACT Aepb ?Commonly known as: ADVAIR ?Inhale 1 puff into the lungs in the morning and at bedtime. ?  ?ibuprofen 200 MG tablet ?Commonly known as: ADVIL ?Take 200 mg by mouth every 6 (six) hours as needed. ?  ?levocetirizine 5 MG tablet ?Commonly known as: XYZAL ?Take 1 tablet (5 mg total) by mouth every evening. ?  ?Melatonin 10 MG Tabs ?Take 10 mg by mouth at bedtime. ?   ?methocarbamol 500 MG tablet ?Commonly known as: Robaxin ?Take 1 tablet (500 mg total) by mouth every 8 (eight) hours as needed for muscle spasms. ?  ?metoprolol tartrate 25 MG tablet ?Commonly known as: LOPRESSOR ?Take 0.5 tablets (12.5 mg total) by mouth 2 (two) times daily. ?  ?montelukast 10 MG tablet ?Commonly known as: SINGULAIR ?Take 1 tablet (10 mg total) by mouth at bedtime. ?  ?nitroGLYCERIN 0.4 MG SL tablet ?Commonly known as: NITROSTAT ?Place 1 tablet (0.4 mg total) under the tongue every 5 (five) minutes as needed for chest pain. ?  ?omeprazole 20 MG capsule ?Commonly known as: PRILOSEC ?Take 1 capsule (20 mg total) by mouth every evening. ?  ?silodosin 8 MG Caps capsule ?Commonly known as: RAPAFLO ?Take 1 capsule (8 mg total) by mouth daily with breakfast. ?  ?traZODone 50 MG tablet ?Commonly known as: DESYREL ?Take 1 tablet (50 mg total) by mouth at bedtime. ?  ? ?  ? ? ?Allergies:  ?Allergies  ?Allergen Reactions  ? Bee Venom Shortness Of Breath  ? Oxycodone Nausea And Vomiting  ? Percocet [Oxycodone-Acetaminophen] Nausea And Vomiting  ? ? ?Family History: ?Family History  ?Problem Relation Age of Onset  ? Coronary artery disease Mother   ?     CABG  ? CAD Sister   ? Bone cancer Sister   ? Alzheimer's disease Father   ? Aneurysm Brother   ?     brain  ? Heart failure Sister   ? Bone cancer Sister   ? Colon cancer Neg Hx   ? Colon polyps Neg Hx   ? Esophageal cancer Neg Hx   ? Rectal cancer Neg Hx   ? Stomach cancer Neg Hx   ? ? ?Social History:  reports that he quit smoking about 55 years ago. His smoking use included cigarettes. He has never used smokeless tobacco. He reports current alcohol use. He reports that he does not use drugs. ? ?ROS: ?All other review of systems were reviewed and are negative except what is noted above in HPI ? ?Physical Exam: ?BP (!) 149/66   Pulse 66   ?Constitutional:  Alert and oriented, No acute distress. ?HEENT: [place] AT, moist mucus membranes.  Trachea midline, no  masses. ?Cardiovascular: No clubbing, cyanosis, or edema. ?Respiratory: Normal respiratory effort, no increased work of breathing. ?GI: Abdomen is soft, nontender, nondistended, no abdominal masses ?GU: No CVA tenderness.  ?Lymph: No cervical or inguinal lymphadenopathy. ?Skin: No rashes, bruises or suspicious lesions. ?Neurologic: Grossly  intact, no focal deficits, moving all 4 extremities. ?Psychiatric: Normal mood and affect. ? ?Laboratory Data: ?Lab Results  ?Component Value Date  ? WBC 8.4 03/04/2021  ? HGB 15.3 03/04/2021  ? HCT 45.3 03/04/2021  ? MCV 86 03/04/2021  ? PLT 226 03/04/2021  ? ? ?Lab Results  ?Component Value Date  ? CREATININE 0.99 03/04/2021  ? ? ?No results found for: PSA ? ?No results found for: TESTOSTERONE ? ?Lab Results  ?Component Value Date  ? HGBA1C 5.6 03/04/2021  ? ? ?Urinalysis ?   ?Component Value Date/Time  ? [place] 02/20/2020 2218  ? APPEARANCEUR Clear 02/19/2021 1014  ? LABSPEC 1.023 02/20/2020 2218  ? PHURINE 5.0 02/20/2020 2218  ? GLUCOSEU Negative 02/19/2021 1014  ? [place] NEGATIVE 02/20/2020 2218  ? BILIRUBINUR Negative 02/19/2021 1014  ? [place] NEGATIVE 02/20/2020 2218  ? PROTEINUR Negative 02/19/2021 1014  ? [place] NEGATIVE 02/20/2020 2218  ? NITRITE Negative 02/19/2021 1014  ? NITRITE NEGATIVE 02/20/2020 2218  ? LEUKOCYTESUR Negative 02/19/2021 1014  ? LEUKOCYTESUR NEGATIVE 02/20/2020 2218  ? ? ?Lab Results  ?Component Value Date  ? LABMICR See below: 02/19/2021  ? [place] None seen 02/19/2021  ? LABEPIT 0-10 02/19/2021  ? BACTERIA Few (A) 02/19/2021  ? ? ?Pertinent Imaging: ? ?No results found for this or any previous visit. ? ?No results found for this or any previous visit. ? ?No results found for this or any previous visit. ? ?No results found for this or any previous visit. ? ?No results found for this or any previous visit. ? ?No results found for this or any previous visit. ? ?No results found for this or any previous visit. ? ?No results found for this  or any previous visit. ? ? ?Assessment & Plan:   ? ?1. Elevated PSA ?-RTC 3 months with PSA ?- Urinalysis, Routine w reflex microscopic ?- BLADDER SCAN AMB NON-IMAGING ?- PSA, total and free; Future ? ?2. Suezanne Jacquet

## 2021-04-23 ENCOUNTER — Encounter: Payer: Self-pay | Admitting: Urology

## 2021-04-23 NOTE — Patient Instructions (Signed)
Prostate Cancer Screening ?Prostate cancer screening is testing that is done to check for the presence of prostate cancer in men. The prostate gland is a walnut-sized gland that is located below the bladder and in front of the rectum in males. The function of the prostate is to add fluid to semen during ejaculation. Prostate cancer is one of the most common types of cancer in men. ?Who should have prostate cancer screening? ?Screening recommendations vary based on age and other risk factors, as well as between the professional organizations who make the recommendations. ?In general, screening is recommended if: ?You are age 50 to 70 and have an average risk for prostate cancer. You should talk with your health care provider about your need for screening and how often screening should be done. Because most prostate cancers are slow growing and will not cause death, screening in this age group is generally reserved for men who have a 10- to 15-year life expectancy. ?You are younger than age 50, and you have these risk factors: ?Having a father, brother, or uncle who has been diagnosed with prostate cancer. The risk is higher if your family member's cancer occurred at an early age or if you have multiple family members with prostate cancer at an early age. ?Being a male who is Black or is of Caribbean or sub-Saharan African descent. ?In general, screening is not recommended if: ?You are younger than age 40. ?You are between the ages of 40 and 49 and you have no risk factors. ?You are 70 years of age or older. At this age, the risks that screening can cause are greater than the benefits that it may provide. ?If you are at high risk for prostate cancer, your health care provider may recommend that you have screenings more often or that you start screening at a younger age. ?How is screening for prostate cancer done? ?The recommended prostate cancer screening test is a blood test called the prostate-specific antigen (PSA)  test. PSA is a protein that is made in the prostate. As you age, your prostate naturally produces more PSA. Abnormally high PSA levels may be caused by: ?Prostate cancer. ?An enlarged prostate that is not caused by cancer (benign prostatic hyperplasia, or BPH). This condition is very common in older men. ?A prostate gland infection (prostatitis) or urinary tract infection. ?Certain medicines such as male hormones (like testosterone) or other medicines that raise testosterone levels. ?A rectal exam may be done as part of prostate cancer screening to help provide information about the size of your prostate gland. When a rectal exam is performed, it should be done after the PSA level is drawn to avoid any effect on the results. ?Depending on the PSA results, you may need more tests, such as: ?A physical exam to check the size of your prostate gland, if not done as part of screening. ?Blood and imaging tests. ?A procedure to remove tissue samples from your prostate gland for testing (biopsy). This is the only way to know for certain if you have prostate cancer. ?What are the benefits of prostate cancer screening? ?Screening can help to identify cancer at an early stage, before symptoms start and when the cancer can be treated more easily. ?There is a small chance that screening may lower your risk of dying from prostate cancer. The chance is small because prostate cancer is a slow-growing cancer, and most men with prostate cancer die from a different cause. ?What are the risks of prostate cancer screening? ?The main   risk of prostate cancer screening is diagnosing and treating prostate cancer that would never have caused any symptoms or problems. This is called overdiagnosisand overtreatment. PSA screening cannot tell you if your PSA is high due to cancer or a different cause. A prostate biopsy is the only procedure to diagnose prostate cancer. Even the results of a biopsy may not tell you if your cancer needs to be  treated. Slow-growing prostate cancer may not need any treatment other than monitoring, so diagnosing and treating it may cause unnecessary stress or other side effects. ?Questions to ask your health care provider ?When should I start prostate cancer screening? ?What is my risk for prostate cancer? ?How often do I need screening? ?What type of screening tests do I need? ?How do I get my test results? ?What do my results mean? ?Do I need treatment? ?Where to find more information ?The American Cancer Society: www.cancer.org ?American Urological Association: www.auanet.org ?Contact a health care provider if: ?You have difficulty urinating. ?You have pain when you urinate or ejaculate. ?You have blood in your urine or semen. ?You have pain in your back or in the area of your prostate. ?Summary ?Prostate cancer is a common type of cancer in men. The prostate gland is located below the bladder and in front of the rectum. This gland adds fluid to semen during ejaculation. ?Prostate cancer screening may identify cancer at an early stage, when the cancer can be treated more easily and is less likely to have spread to other areas of the body. ?The prostate-specific antigen (PSA) test is the recommended screening test for prostate cancer, but it has associated risks. ?Discuss the risks and benefits of prostate cancer screening with your health care provider. If you are age 70 or older, the risks that screening can cause are greater than the benefits that it may provide. ?This information is not intended to replace advice given to you by your health care provider. Make sure you discuss any questions you have with your health care provider. ?Document Revised: 07/02/2020 Document Reviewed: 07/02/2020 ?Elsevier Patient Education ? 2022 [employer] ? ?

## 2021-04-27 ENCOUNTER — Other Ambulatory Visit: Payer: Self-pay | Admitting: Family Medicine

## 2021-04-27 DIAGNOSIS — M545 Low back pain, unspecified: Secondary | ICD-10-CM

## 2021-05-02 ENCOUNTER — Encounter: Payer: Self-pay | Admitting: Physician Assistant

## 2021-05-02 ENCOUNTER — Ambulatory Visit: Payer: Medicare HMO | Admitting: Physician Assistant

## 2021-05-02 VITALS — BP 128/66 | HR 66 | Ht 69.0 in | Wt 225.0 lb

## 2021-05-02 DIAGNOSIS — I2510 Atherosclerotic heart disease of native coronary artery without angina pectoris: Secondary | ICD-10-CM

## 2021-05-02 DIAGNOSIS — E785 Hyperlipidemia, unspecified: Secondary | ICD-10-CM

## 2021-05-02 NOTE — Progress Notes (Signed)
?Cardiology Office Note:   ? ?Date:  05/04/2021  ? ?ID:  Ronald Berger, DOB 09-Oct-1945, MRN [id number] ? ?PCP:  Loman Brooklyn, FNP ?  ?[facility] Providers ?Cardiologist:  Pixie Casino, MD    ? ?Referring MD: Loman Brooklyn, FNP  ? ?Chief Complaint  ?Patient presents with  ? Follow-up  ?  1 year.  ? ? ?History of Present Illness:   ? ?Ronald Berger is a 76 y.o. male with a hx of CAD, hyperlipidemia and GERD.  Patient underwent DES to left circumflex, OM1 and the mid LAD for NSTEMI in July 2015, he had nonobstructive RCA disease.  EF was normal at the time.  Patient was last seen by Dr. Debara Pickett on 04/05/2020 at which time he was having some issues with asthma and the difficulty breathing, however no cardiac issue. ? ?Patient presents today for follow-up.  He denies any recent chest discomfort or worsening dyspnea.  Blood pressure and heart rate are very well controlled.  Recent blood work showed a well-controlled cholesterol.  EKG is unchanged.  He has no orthopnea or PND.  He can follow-up in 1 year. ? ?Past Medical History:  ?Diagnosis Date  ? Allergic rhinitis   ? Allergy   ? Asthma due to environmental allergies   ? 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  ? Bilateral carpal tunnel syndrome 12/08/2018  ? CAD, multiple vessel CARDIOLOGIST-  DR HILTY  ? a. NSTEMI 07/2013 - DES to mLCx, DES to prox OM1, DES to mLAD with nonobstructive RCA stenosis, EF 55-65%.  ? Chronic kidney disease   ? kidney stones   ? Dyslipidemia   ? Family history of bone cancer   ? GERD (gastroesophageal reflux disease)   ? History of kidney stones   ? History of non-ST elevation myocardial infarction (NSTEMI)   ? 07/ 2015  S/P  DES X3  ? History of palpitations   ? Hyperlipidemia   ? Hypertension   ? Insomnia 12/08/2018  ? x many years  ? Myocardial infarction [facility]) 2015  ? NSTEMI- 3 stents   ? OSA (obstructive sleep apnea)   ? cpap intolerant  ? Pneumonia due to COVID-19 virus   ? Right  knee meniscal tear   ? S/P drug eluting coronary stent placement   ? 07/ 2015  x3  to mLCFX, OM1, mLAD  ? Sinus bradycardia   ? Sleep apnea   ? no cpap   ? ? ?Past Surgical History:  ?Procedure Laterality Date  ? CATARACT EXTRACTION, BILATERAL    ? COLONOSCOPY    ? >10 yrs ago-polyps per pt- we have no report   ? EXTRACORPOREAL SHOCK WAVE LITHOTRIPSY  x2 last one 2005  ? KNEE ARTHROSCOPY Right 05/02/2015  ? Procedure: RIGHT KNEE ARTHROSCOPY WITH medial meniscal DEBRIDEMENT ;  Surgeon: Gaynelle Arabian, MD;  Location: [facility];  Service: Orthopedics;  Laterality: Right;  ? LEFT HEART CATHETERIZATION WITH CORONARY ANGIOGRAM N/A 08/05/2013  ? Procedure: LEFT HEART CATHETERIZATION WITH CORONARY ANGIOGRAM;  Surgeon: Blane Ohara, MD;  Location: [facility] CATH LAB;  Service: Cardiovascular;  Laterality: N/A;  Promus DES's to  mLCFx,  OM1,  mLAD (total 3)/   mRCA 30%,  dLM  20-30%,  preserved LVSF, ef 55-65%  ? LUMBAR SPINE SURGERY  x3  last one 1980's  ? SHOULDER ARTHROSCOPY Right 2013  ? TONSILLECTOMY    ? ? ?Current Medications: ?Current Meds  ?  Medication Sig  ? albuterol (VENTOLIN HFA) 108 (90 Base) MCG/ACT inhaler INHALE 2 PUFFS INTO THE LUNGS EVERY 6  HOURS AS NEEDED FOR WHEEZING OR SHORTNESS OF BREATH.  ? aspirin 81 MG chewable tablet Chew 1 tablet (81 mg total) by mouth daily.  ? atorvastatin (LIPITOR) 40 MG tablet Take 1 tablet (40 mg total) by mouth daily.  ? diclofenac (VOLTAREN) 75 MG EC tablet Take 1 tablet (75 mg total) by mouth 2 (two) times daily.  ? EPINEPHRINE 0.3 mg/0.3 mL IJ SOAJ injection INJECT 0.3 MG INTO THE MUSCLE AS NEEDED FOR ANAPHYLAXIS.  ? fluticasone (FLONASE) 50 MCG/ACT nasal spray USE 2 SPRAYS IN EACH NOSTRIL EVERY DAY  ? fluticasone-salmeterol (ADVAIR) 250-50 MCG/ACT AEPB Inhale 1 puff into the lungs in the morning and at bedtime.  ? ibuprofen (ADVIL) 200 MG tablet Take 200 mg by mouth every 6 (six) hours as needed.  ? levocetirizine (XYZAL) 5 MG tablet Take 1 tablet (5 mg total)  by mouth every evening.  ? Melatonin 10 MG TABS Take 10 mg by mouth at bedtime.  ? methocarbamol (ROBAXIN) 500 MG tablet TAKE 1 TABLET BY MOUTH EVERY 8 HOURS AS NEEDED FOR MUSCLE SPASMS  ? metoprolol tartrate (LOPRESSOR) 25 MG tablet Take 0.5 tablets (12.5 mg total) by mouth 2 (two) times daily.  ? montelukast (SINGULAIR) 10 MG tablet Take 1 tablet (10 mg total) by mouth at bedtime.  ? Multiple Vitamins-Minerals (EMERGEN-C BLUE) PACK Take 1 each by mouth daily.  ? nitroGLYCERIN (NITROSTAT) 0.4 MG SL tablet Place 1 tablet (0.4 mg total) under the tongue every 5 (five) minutes as needed for chest pain.  ? omeprazole (PRILOSEC) 20 MG capsule Take 1 capsule (20 mg total) by mouth every evening.  ? silodosin (RAPAFLO) 8 MG CAPS capsule Take 1 capsule (8 mg total) by mouth daily with breakfast.  ? traZODone (DESYREL) 50 MG tablet Take 1 tablet (50 mg total) by mouth at bedtime.  ?  ? ?Allergies:   Bee venom, Oxycodone, and Percocet [oxycodone-acetaminophen]  ? ?Social History  ? ?Socioeconomic History  ? Marital status: Married  ?  Spouse name: Joelene Millin  ? Number of children: 1  ? Years of education: 53  ? Highest education level: Some college, no degree  ?Occupational History  ? Occupation: [employer]  ?  Comment: Retired - prior Oceanographer  ?Tobacco Use  ? Smoking status: Former  ?  Years: 1.00  ?  Types: Cigarettes  ?  Quit date: 04/27/1966  ?  Years since quitting: 55.0  ? Smokeless tobacco: Never  ?Vaping Use  ? Vaping Use: Never used  ?Substance and Sexual Activity  ? Alcohol use: Yes  ?  Comment: occasional  ? Drug use: No  ? Sexual activity: Yes  ?Other Topics Concern  ? Not on file  ?Social History Narrative  ? Son lives with them  ? One level home  ? ?Social Determinants of Health  ? ?Financial Resource Strain: Low Risk   ? Difficulty of Paying Living Expenses: Not hard at all  ?Food Insecurity: No Food Insecurity  ? Worried About Charity fundraiser in the Last Year: Never true  ? Ran Out of Food  in the Last Year: Never true  ?Transportation Needs: No Transportation Needs  ? Lack of Transportation (Medical): No  ? Lack of Transportation (Non-Medical): No  ?Physical Activity: Sufficiently Active  ? Days of Exercise per Week: 5 days  ? Minutes of Exercise per Session: 60 min  ?  Stress: No Stress Concern Present  ? Feeling of Stress : Not at all  ?Social Connections: Moderately Isolated  ? Frequency of Communication with Friends and Family: Once a week  ? Frequency of Social Gatherings with Friends and Family: Once a week  ? Attends Religious Services: Never  ? Active Member of Clubs or Organizations: Yes  ? Attends Archivist Meetings: More than 4 times per year  ? Marital Status: Married  ?  ? ?Family History: ?The patient's family history includes Alzheimer's disease in his father; Aneurysm in his brother; Bone cancer in his sister and sister; CAD in his sister; Coronary artery disease in his mother; Heart failure in his sister. There is no history of Colon cancer, Colon polyps, Esophageal cancer, Rectal cancer, or Stomach cancer. ? ?ROS:   ?Please see the history of present illness.    ? All other systems reviewed and are negative. ? ?EKGs/Labs/Other Studies Reviewed:   ? ?The following studies were reviewed today: ? ?N/A ? ?EKG:  EKG is ordered today.  The ekg ordered today demonstrates normal sinus rhythm, no significant ST-T wave changes. ? ?Recent Labs: ?06/14/2020: Magnesium 2.0; TSH 3.670 ?03/04/2021: ALT 28; BUN 10; Creatinine, Ser 0.99; Hemoglobin 15.3; Platelets 226; Potassium 4.0; Sodium 143  ?Recent Lipid Panel ?   ?Component Value Date/Time  ? CHOL 131 03/04/2021 1622  ? TRIG 142 03/04/2021 1622  ? HDL 50 03/04/2021 1622  ? CHOLHDL 2.6 03/04/2021 1622  ? CHOLHDL 2.1 06/22/2015 1306  ? VLDL 17 06/22/2015 1306  ? [place] 57 03/04/2021 1622  ? ? ? ?Risk Assessment/Calculations:   ?  ? ?    ? ?Physical Exam:   ? ?VS:  BP 128/66 (BP Location: Left Arm, Patient Position: Sitting, Cuff Size:  Normal)   Pulse 66   Ht '5\' 9"'$  (1.753 m)   Wt 225 lb (102.1 kg)   BMI 33.23 kg/m?    ? ?Wt Readings from Last 3 Encounters:  ?05/02/21 225 lb (102.1 kg)  ?04/04/21 226 lb 6.4 oz (102.7 kg)  ?04/03/21 210

## 2021-05-02 NOTE — Patient Instructions (Signed)
Medication Instructions:  Your physician recommends that you continue on your current medications as directed. Please refer to the Current Medication list given to you today.  *If you need a refill on your cardiac medications before your next appointment, please call your pharmacy*  Lab Work: NONE ordered at this time of appointment   If you have labs (blood work) drawn today and your tests are completely normal, you will receive your results only by: MyChart Message (if you have MyChart) OR A paper copy in the mail If you have any lab test that is abnormal or we need to change your treatment, we will call you to review the results.  Testing/Procedures: NONE ordered at this time of appointment   Follow-Up: At [facility], you and your health needs are our priority.  As part of our continuing mission to provide you with exceptional heart care, we have created designated Provider Care Teams.  These Care Teams include your primary Cardiologist (physician) and Advanced Practice Providers (APPs -  Physician Assistants and Nurse Practitioners) who all work together to provide you with the care you need, when you need it.  Your next appointment:   1 year(s)  The format for your next appointment:   In Person  Provider:   Kenneth C Hilty, MD     Other Instructions   Important Information About Sugar       

## 2021-05-08 ENCOUNTER — Other Ambulatory Visit: Payer: Self-pay | Admitting: Family Medicine

## 2021-05-08 NOTE — Telephone Encounter (Signed)
Rx refilled in January.  ?

## 2021-05-13 ENCOUNTER — Ambulatory Visit (INDEPENDENT_AMBULATORY_CARE_PROVIDER_SITE_OTHER): Payer: Medicare HMO | Admitting: Family Medicine

## 2021-05-13 ENCOUNTER — Encounter: Payer: Self-pay | Admitting: Family Medicine

## 2021-05-13 DIAGNOSIS — S76212A Strain of adductor muscle, fascia and tendon of left thigh, initial encounter: Secondary | ICD-10-CM | POA: Diagnosis not present

## 2021-05-13 MED ORDER — PREDNISONE 20 MG PO TABS: ORAL_TABLET | ORAL | 0 refills | Status: DC

## 2021-05-13 MED ORDER — METHOCARBAMOL 500 MG PO TABS: 500.0000 mg | ORAL_TABLET | Freq: Three times a day (TID) | ORAL | 0 refills | Status: DC | PRN

## 2021-05-13 NOTE — Progress Notes (Signed)
? ?Virtual Visit via telephone Note ? ?I connected with Ronald Berger on 05/13/21 at 0818 by telephone and verified that I am speaking with the correct person using two identifiers. Ronald Berger is currently located at home and patient are currently with her during visit. The provider, Fransisca Kaufmann Greogry Goodwyn, MD is located in their office at time of visit. ? ?Call ended at 0829 ? ?I discussed the limitations, risks, security and privacy concerns of performing an evaluation and management service by telephone and the availability of in person appointments. I also discussed with the patient that there may be a patient responsible charge related to this service. The patient expressed understanding and agreed to proceed. ? ? ?History and Present Illness: ?He is calling in for a pulled groin muscle on the left side on Saturday 2 days ago.  He is having trouble walking with pain.  He did Voltaren and it did not seem to help much in this area. He feels worse today and it is going down leg. He has not gotten relief from any over the counter medicine at this point.  ? ?1. Strain of groin, left, initial encounter   ? ? ?Outpatient Encounter Medications as of 05/13/2021  ?Medication Sig  ? predniSONE (DELTASONE) 20 MG tablet 2 po at same time daily for 5 days  ? albuterol (VENTOLIN HFA) 108 (90 Base) MCG/ACT inhaler INHALE 2 PUFFS INTO THE LUNGS EVERY 6  HOURS AS NEEDED FOR WHEEZING OR SHORTNESS OF BREATH.  ? aspirin 81 MG chewable tablet Chew 1 tablet (81 mg total) by mouth daily.  ? atorvastatin (LIPITOR) 40 MG tablet Take 1 tablet (40 mg total) by mouth daily.  ? EPINEPHRINE 0.3 mg/0.3 mL IJ SOAJ injection INJECT 0.3 MG INTO THE MUSCLE AS NEEDED FOR ANAPHYLAXIS.  ? fluticasone (FLONASE) 50 MCG/ACT nasal spray USE 2 SPRAYS IN EACH NOSTRIL EVERY DAY  ? fluticasone-salmeterol (ADVAIR) 250-50 MCG/ACT AEPB Inhale 1 puff into the lungs in the morning and at bedtime.  ? levocetirizine (XYZAL) 5 MG tablet Take 1 tablet (5 mg  total) by mouth every evening.  ? Melatonin 10 MG TABS Take 10 mg by mouth at bedtime.  ? methocarbamol (ROBAXIN) 500 MG tablet Take 1 tablet (500 mg total) by mouth every 8 (eight) hours as needed for muscle spasms.  ? metoprolol tartrate (LOPRESSOR) 25 MG tablet Take 0.5 tablets (12.5 mg total) by mouth 2 (two) times daily.  ? montelukast (SINGULAIR) 10 MG tablet Take 1 tablet (10 mg total) by mouth at bedtime.  ? Multiple Vitamins-Minerals (EMERGEN-C BLUE) PACK Take 1 each by mouth daily.  ? nitroGLYCERIN (NITROSTAT) 0.4 MG SL tablet Place 1 tablet (0.4 mg total) under the tongue every 5 (five) minutes as needed for chest pain.  ? omeprazole (PRILOSEC) 20 MG capsule Take 1 capsule (20 mg total) by mouth every evening.  ? silodosin (RAPAFLO) 8 MG CAPS capsule Take 1 capsule (8 mg total) by mouth daily with breakfast.  ? traZODone (DESYREL) 50 MG tablet Take 1 tablet (50 mg total) by mouth at bedtime.  ? [DISCONTINUED] diclofenac (VOLTAREN) 75 MG EC tablet Take 1 tablet (75 mg total) by mouth 2 (two) times daily.  ? [DISCONTINUED] ibuprofen (ADVIL) 200 MG tablet Take 200 mg by mouth every 6 (six) hours as needed.  ? [DISCONTINUED] methocarbamol (ROBAXIN) 500 MG tablet TAKE 1 TABLET BY MOUTH EVERY 8 HOURS AS NEEDED FOR MUSCLE SPASMS  ? ?No facility-administered encounter medications on file as of 05/13/2021.  ? ? ?  Review of Systems  ?Constitutional:  Negative for chills and fever.  ?Respiratory:  Negative for shortness of breath and wheezing.   ?Cardiovascular:  Negative for chest pain and leg swelling.  ?Musculoskeletal:  Positive for arthralgias, gait problem and myalgias.  ?Skin:  Negative for rash.  ?All other systems reviewed and are negative. ? ?Observations/Objective: ?Patient sounds comfortable and in no acute distress ? ?Assessment and Plan: ?Problem List Items Addressed This Visit   ?None ?Visit Diagnoses   ? ? Strain of groin, left, initial encounter    -  Primary  ? Relevant Medications  ? methocarbamol  (ROBAXIN) 500 MG tablet  ? predniSONE (DELTASONE) 20 MG tablet  ? ?  ?  ?We will do short course of steroids, will avoid oral anti-inflammatories while on steroids and gave refill on muscle relaxer.  He is also going to use some Biofreeze but try and avoid getting down his scrotum.  Also recommended gentle stretching and movement exercises. ?Follow up plan: ?Return if symptoms worsen or fail to improve. ? ? ?  ?I discussed the assessment and treatment plan with the patient. The patient was provided an opportunity to ask questions and all were answered. The patient agreed with the plan and demonstrated an understanding of the instructions. ?  ?The patient was advised to call back or seek an in-person evaluation if the symptoms worsen or if the condition fails to improve as anticipated. ? ?The above assessment and management plan was discussed with the patient. The patient verbalized understanding of and has agreed to the management plan. Patient is aware to call the clinic if symptoms persist or worsen. Patient is aware when to return to the clinic for a follow-up visit. Patient educated on when it is appropriate to go to the emergency department.  ? ? ?I provided 11 minutes of non-face-to-face time during this encounter. ? ? ? ?Worthy Rancher, MD ?   ?

## 2021-05-15 ENCOUNTER — Ambulatory Visit (INDEPENDENT_AMBULATORY_CARE_PROVIDER_SITE_OTHER): Payer: Medicare HMO | Admitting: Family Medicine

## 2021-05-15 ENCOUNTER — Ambulatory Visit: Payer: Medicare HMO | Admitting: Family Medicine

## 2021-05-15 ENCOUNTER — Ambulatory Visit (HOSPITAL_COMMUNITY)
Admission: RE | Admit: 2021-05-15 | Discharge: 2021-05-15 | Disposition: A | Discharge status: Home / Self Care | Payer: Medicare HMO | Source: Ambulatory Visit | Attending: Urology | Admitting: Urology

## 2021-05-15 ENCOUNTER — Ambulatory Visit: Payer: Medicare HMO | Admitting: Urology

## 2021-05-15 ENCOUNTER — Encounter: Payer: Self-pay | Admitting: Family Medicine

## 2021-05-15 ENCOUNTER — Ambulatory Visit (INDEPENDENT_AMBULATORY_CARE_PROVIDER_SITE_OTHER): Payer: Medicare HMO

## 2021-05-15 ENCOUNTER — Encounter: Payer: Self-pay | Admitting: Urology

## 2021-05-15 VITALS — BP 156/73 | HR 65

## 2021-05-15 VITALS — BP 129/69 | HR 58 | Ht 69.0 in | Wt 223.0 lb

## 2021-05-15 DIAGNOSIS — R1032 Left lower quadrant pain: Secondary | ICD-10-CM

## 2021-05-15 DIAGNOSIS — N200 Calculus of kidney: Secondary | ICD-10-CM | POA: Diagnosis not present

## 2021-05-15 DIAGNOSIS — R109 Unspecified abdominal pain: Secondary | ICD-10-CM | POA: Diagnosis not present

## 2021-05-15 DIAGNOSIS — Z87442 Personal history of urinary calculi: Secondary | ICD-10-CM | POA: Diagnosis not present

## 2021-05-15 DIAGNOSIS — I700 Atherosclerosis of aorta: Secondary | ICD-10-CM | POA: Diagnosis not present

## 2021-05-15 DIAGNOSIS — R1011 Right upper quadrant pain: Secondary | ICD-10-CM

## 2021-05-15 LAB — URINALYSIS, ROUTINE W REFLEX MICROSCOPIC
Bilirubin, UA: NEGATIVE
Bilirubin, UA: NEGATIVE
Glucose, UA: NEGATIVE
Glucose, UA: NEGATIVE
Ketones, UA: NEGATIVE
Ketones, UA: NEGATIVE
Leukocytes,UA: NEGATIVE
Leukocytes,UA: NEGATIVE
Nitrite, UA: NEGATIVE
Nitrite, UA: NEGATIVE
Protein,UA: NEGATIVE
RBC, UA: NEGATIVE
Specific Gravity, UA: 1.025 (ref 1.005–1.030)
Specific Gravity, UA: 1.01 (ref 1.005–1.030)
Urobilinogen, Ur: 0.2 mg/dL (ref 0.2–1.0)
Urobilinogen, Ur: 0.2 mg/dL (ref 0.2–1.0)
pH, UA: 5.5 (ref 5.0–7.5)
pH, UA: 6 (ref 5.0–7.5)

## 2021-05-15 LAB — MICROSCOPIC EXAMINATION
Bacteria, UA: NONE SEEN
Epithelial Cells (non renal): NONE SEEN /hpf (ref 0–10)
RBC, Urine: NONE SEEN /hpf (ref 0–2)
Renal Epithel, UA: NONE SEEN /hpf
WBC, UA: NONE SEEN /hpf (ref 0–5)

## 2021-05-15 MED ORDER — CYCLOBENZAPRINE HCL 5 MG PO TABS: 5.0000 mg | ORAL_TABLET | Freq: Three times a day (TID) | ORAL | 1 refills | Status: DC | PRN

## 2021-05-15 MED ORDER — HYDROCODONE-ACETAMINOPHEN 7.5-325 MG PO TABS: 1.0000 | ORAL_TABLET | Freq: Four times a day (QID) | ORAL | 0 refills | Status: AC | PRN

## 2021-05-15 NOTE — Progress Notes (Signed)
? ?Assessment & Plan:  ?1. Bilateral kidney stones ?Education provided on kidney stones.  Encourage patient to follow-up ASAP with urology given largest stone of 12 mm.  Continue Rapaflo. Start Norco for pain control. ?- HYDROcodone-acetaminophen (NORCO) 7.5-325 MG tablet; Take 1 tablet by mouth every 6 (six) hours as needed for up to 5 days for moderate pain.  Dispense: 20 tablet; Refill: 0 ? ?2-3. LLQ abdominal pain/History of kidney stones ?- Urinalysis, Routine w reflex microscopic (negative) ?- DG Abd 1 View ? ? ?Follow up plan: Return if symptoms worsen or fail to improve. ? ?Hendricks Limes, MSN, APRN, FNP-C ?[place] ? ?Subjective:  ? ?Patient ID: Ronald Berger, male    DOB: 1945/09/29, 76 y.o.   MRN: [id number] ? ?HPI: ?Ronald Berger is a 76 y.o. male presenting on 05/15/2021 for Groin Pain (Left sided radiates to lower abdominal area- ? Kidney stone) ? ?Patient reports left groin pain that radiates to his left lower abdomen. Pain started four days ago. He reports he felt fine yesterday, which made him think about previous kidney stones. Denies fever and hematuria.  ? ? ?ROS: Negative unless specifically indicated above in HPI.  ? ?Relevant past medical history reviewed and updated as indicated.  ? ?Allergies and medications reviewed and updated. ? ? ?Current Outpatient Medications:  ?  albuterol (VENTOLIN HFA) 108 (90 Base) MCG/ACT inhaler, INHALE 2 PUFFS INTO THE LUNGS EVERY 6  HOURS AS NEEDED FOR WHEEZING OR SHORTNESS OF BREATH., Disp: 3 each, Rfl: 1 ?  aspirin 81 MG chewable tablet, Chew 1 tablet (81 mg total) by mouth daily., Disp: , Rfl:  ?  atorvastatin (LIPITOR) 40 MG tablet, Take 1 tablet (40 mg total) by mouth daily., Disp: 90 tablet, Rfl: 1 ?  EPINEPHRINE 0.3 mg/0.3 mL IJ SOAJ injection, INJECT 0.3 MG INTO THE MUSCLE AS NEEDED FOR ANAPHYLAXIS., Disp: 2 each, Rfl: 2 ?  fluticasone (FLONASE) 50 MCG/ACT nasal spray, USE 2 SPRAYS IN EACH NOSTRIL EVERY DAY, Disp: 48 g,  Rfl: 1 ?  fluticasone-salmeterol (ADVAIR) 250-50 MCG/ACT AEPB, Inhale 1 puff into the lungs in the morning and at bedtime., Disp: 60 each, Rfl: 5 ?  levocetirizine (XYZAL) 5 MG tablet, Take 1 tablet (5 mg total) by mouth every evening., Disp: 90 tablet, Rfl: 1 ?  Melatonin 10 MG TABS, Take 10 mg by mouth at bedtime., Disp: , Rfl:  ?  methocarbamol (ROBAXIN) 500 MG tablet, Take 1 tablet (500 mg total) by mouth every 8 (eight) hours as needed for muscle spasms., Disp: 30 tablet, Rfl: 0 ?  metoprolol tartrate (LOPRESSOR) 25 MG tablet, Take 0.5 tablets (12.5 mg total) by mouth 2 (two) times daily., Disp: 90 tablet, Rfl: 3 ?  montelukast (SINGULAIR) 10 MG tablet, Take 1 tablet (10 mg total) by mouth at bedtime., Disp: 90 tablet, Rfl: 1 ?  Multiple Vitamins-Minerals (EMERGEN-C BLUE) PACK, Take 1 each by mouth daily., Disp: , Rfl:  ?  nitroGLYCERIN (NITROSTAT) 0.4 MG SL tablet, Place 1 tablet (0.4 mg total) under the tongue every 5 (five) minutes as needed for chest pain., Disp: 25 tablet, Rfl: 2 ?  omeprazole (PRILOSEC) 20 MG capsule, Take 1 capsule (20 mg total) by mouth every evening., Disp: 90 capsule, Rfl: 3 ?  predniSONE (DELTASONE) 20 MG tablet, 2 po at same time daily for 5 days, Disp: 10 tablet, Rfl: 0 ?  silodosin (RAPAFLO) 8 MG CAPS capsule, Take 1 capsule (8 mg total) by mouth daily with breakfast., Disp: 30  capsule, Rfl: 11 ?  traZODone (DESYREL) 50 MG tablet, Take 1 tablet (50 mg total) by mouth at bedtime., Disp: 90 tablet, Rfl: 1 ? ?Allergies  ?Allergen Reactions  ? Bee Venom Shortness Of Breath  ? Oxycodone Nausea And Vomiting  ? Percocet [Oxycodone-Acetaminophen] Nausea And Vomiting  ? ? ?Objective:  ? ?BP 129/69   Pulse (!) 58   Ht '5\' 9"'$  (1.753 m)   Wt 223 lb (101.2 kg)   SpO2 97%   BMI 32.93 kg/m?   ? ?Physical Exam ?Vitals reviewed.  ?Constitutional:   ?   General: He is not in acute distress. ?   Appearance: Normal appearance. He is not ill-appearing, toxic-appearing or diaphoretic.  ?HENT:  ?    Head: Normocephalic and atraumatic.  ?Eyes:  ?   General: No scleral icterus.    ?   Right eye: No discharge.     ?   Left eye: No discharge.  ?   Conjunctiva/sclera: Conjunctivae normal.  ?Cardiovascular:  ?   Rate and Rhythm: Normal rate.  ?Pulmonary:  ?   Effort: Pulmonary effort is normal. No respiratory distress.  ?Abdominal:  ?   Palpations: Abdomen is soft. There is no shifting dullness, fluid wave, hepatomegaly, splenomegaly, mass or pulsatile mass.  ?   Tenderness: There is abdominal tenderness in the left lower quadrant. There is no right CVA tenderness or left CVA tenderness.  ?Musculoskeletal:     ?   General: Normal range of motion.  ?   Cervical back: Normal range of motion.  ?Skin: ?   General: Skin is warm and dry.  ?Neurological:  ?   Mental Status: He is alert and oriented to person, place, and time. Mental status is at baseline.  ?Psychiatric:     ?   Mood and Affect: Mood normal.     ?   Behavior: Behavior normal.     ?   Thought Content: Thought content normal.     ?   Judgment: Judgment normal.  ? ? ? ? ? ? ?

## 2021-05-15 NOTE — H&P (View-Only) (Signed)
? ?05/15/2021 ?3:36 PM  ? ?Ronald Berger ?28-Jun-1945 ?[id number] ? ?Referring provider: Loman Brooklyn, FNP ?[address] [place],  [place] [address] ? ?Nephrolithiasis ? ? ?HPI: ?Ronald Berger is a 76yo here for followup for nephrolithiasis. He developed right upper quadrant pain 3 days ago. The pain ios sharp, intermittent, moderate and nonraditing. He states the pain feels similar to his prior stone events. No nausea or vomiting. No significant LUTS. CT stone study from today shows bilateral renal calculi but no ureteral calculi.  ? ? ?PMH: ?Past Medical History:  ?Diagnosis Date  ? Allergic rhinitis   ? Allergy   ? Asthma due to environmental allergies   ? 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  ? Bilateral carpal tunnel syndrome 12/08/2018  ? CAD, multiple vessel CARDIOLOGIST-  DR HILTY  ? a. NSTEMI 07/2013 - DES to mLCx, DES to prox OM1, DES to mLAD with nonobstructive RCA stenosis, EF 55-65%.  ? Chronic kidney disease   ? kidney stones   ? Dyslipidemia   ? Family history of bone cancer   ? GERD (gastroesophageal reflux disease)   ? History of kidney stones   ? History of non-ST elevation myocardial infarction (NSTEMI)   ? 07/ 2015  S/P  DES X3  ? History of palpitations   ? Hyperlipidemia   ? Hypertension   ? Insomnia 12/08/2018  ? x many years  ? Myocardial infarction [facility]) 2015  ? NSTEMI- 3 stents   ? OSA (obstructive sleep apnea)   ? cpap intolerant  ? Pneumonia due to COVID-19 virus   ? Right knee meniscal tear   ? S/P drug eluting coronary stent placement   ? 07/ 2015  x3  to mLCFX, OM1, mLAD  ? Sinus bradycardia   ? Sleep apnea   ? no cpap   ? ? ?Surgical History: ?Past Surgical History:  ?Procedure Laterality Date  ? CATARACT EXTRACTION, BILATERAL    ? COLONOSCOPY    ? >10 yrs ago-polyps per pt- we have no report   ? EXTRACORPOREAL SHOCK WAVE LITHOTRIPSY  x2 last one 2005  ? KNEE ARTHROSCOPY Right 05/02/2015  ? Procedure: RIGHT KNEE ARTHROSCOPY WITH medial  meniscal DEBRIDEMENT ;  Surgeon: Gaynelle Arabian, MD;  Location: [place];  Service: Orthopedics;  Laterality: Right;  ? LEFT HEART CATHETERIZATION WITH CORONARY ANGIOGRAM N/A 08/05/2013  ? Procedure: LEFT HEART CATHETERIZATION WITH CORONARY ANGIOGRAM;  Surgeon: Blane Ohara, MD;  Location: [facility] CATH LAB;  Service: Cardiovascular;  Laterality: N/A;  Promus DES's to  mLCFx,  OM1,  mLAD (total 3)/   mRCA 30%,  dLM  20-30%,  preserved LVSF, ef 55-65%  ? LUMBAR SPINE SURGERY  x3  last one 1980's  ? SHOULDER ARTHROSCOPY Right 2013  ? TONSILLECTOMY    ? ? ?Home Medications:  ?Allergies as of 05/15/2021   ? ?   Reactions  ? Bee Venom Shortness Of Breath  ? Oxycodone Nausea And Vomiting  ? Percocet [oxycodone-acetaminophen] Nausea And Vomiting  ? ?  ? ?  ?Medication List  ?  ? ?  ? Accurate as of May 15, 2021  3:36 PM. If you have any questions, ask your nurse or doctor.  ?  ?  ? ?  ? ?albuterol 108 (90 Base) MCG/ACT inhaler ?Commonly known as: VENTOLIN HFA ?INHALE 2 PUFFS INTO THE LUNGS EVERY 6  HOURS AS NEEDED FOR WHEEZING OR SHORTNESS OF BREATH. ?  ?  aspirin 81 MG chewable tablet ?Chew 1 tablet (81 mg total) by mouth daily. ?  ?atorvastatin 40 MG tablet ?Commonly known as: LIPITOR ?Take 1 tablet (40 mg total) by mouth daily. ?  ?cyclobenzaprine 5 MG tablet ?Commonly known as: FLEXERIL ?Take 1 tablet (5 mg total) by mouth 3 (three) times daily as needed for muscle spasms. ?Started by: Nicolette Bang, MD ?  ?Emergen-C Blue Pack ?Take 1 each by mouth daily. ?  ?EPINEPHrine 0.3 mg/0.3 mL Soaj injection ?Commonly known as: EPI-PEN ?INJECT 0.3 MG INTO THE MUSCLE AS NEEDED FOR ANAPHYLAXIS. ?  ?fluticasone 50 MCG/ACT nasal spray ?Commonly known as: FLONASE ?USE 2 SPRAYS IN EACH NOSTRIL EVERY DAY ?  ?fluticasone-salmeterol 250-50 MCG/ACT Aepb ?Commonly known as: ADVAIR ?Inhale 1 puff into the lungs in the morning and at bedtime. ?  ?HYDROcodone-acetaminophen 7.5-325 MG tablet ?Commonly known as: Norco ?Take 1  tablet by mouth every 6 (six) hours as needed for up to 5 days for moderate pain. ?Started by: Loman Brooklyn, FNP ?  ?levocetirizine 5 MG tablet ?Commonly known as: XYZAL ?Take 1 tablet (5 mg total) by mouth every evening. ?  ?Melatonin 10 MG Tabs ?Take 10 mg by mouth at bedtime. ?  ?methocarbamol 500 MG tablet ?Commonly known as: ROBAXIN ?Take 1 tablet (500 mg total) by mouth every 8 (eight) hours as needed for muscle spasms. ?  ?metoprolol tartrate 25 MG tablet ?Commonly known as: LOPRESSOR ?Take 0.5 tablets (12.5 mg total) by mouth 2 (two) times daily. ?  ?montelukast 10 MG tablet ?Commonly known as: SINGULAIR ?Take 1 tablet (10 mg total) by mouth at bedtime. ?  ?nitroGLYCERIN 0.4 MG SL tablet ?Commonly known as: NITROSTAT ?Place 1 tablet (0.4 mg total) under the tongue every 5 (five) minutes as needed for chest pain. ?  ?omeprazole 20 MG capsule ?Commonly known as: PRILOSEC ?Take 1 capsule (20 mg total) by mouth every evening. ?  ?predniSONE 20 MG tablet ?Commonly known as: DELTASONE ?2 po at same time daily for 5 days ?  ?silodosin 8 MG Caps capsule ?Commonly known as: RAPAFLO ?Take 1 capsule (8 mg total) by mouth daily with breakfast. ?  ?traZODone 50 MG tablet ?Commonly known as: DESYREL ?Take 1 tablet (50 mg total) by mouth at bedtime. ?  ? ?  ? ? ?Allergies:  ?Allergies  ?Allergen Reactions  ? Bee Venom Shortness Of Breath  ? Oxycodone Nausea And Vomiting  ? Percocet [Oxycodone-Acetaminophen] Nausea And Vomiting  ? ? ?Family History: ?Family History  ?Problem Relation Age of Onset  ? Coronary artery disease Mother   ?     CABG  ? CAD Sister   ? Bone cancer Sister   ? Alzheimer's disease Father   ? Aneurysm Brother   ?     brain  ? Heart failure Sister   ? Bone cancer Sister   ? Colon cancer Neg Hx   ? Colon polyps Neg Hx   ? Esophageal cancer Neg Hx   ? Rectal cancer Neg Hx   ? Stomach cancer Neg Hx   ? ? ?Social History:  reports that he quit smoking about 55 years ago. His smoking use included  cigarettes. He has never used smokeless tobacco. He reports current alcohol use. He reports that he does not use drugs. ? ?ROS: ?All other review of systems were reviewed and are negative except what is noted above in HPI ? ?Physical Exam: ?BP (!) 156/73   Pulse 65   ?Constitutional:  Alert and oriented,  No acute distress. ?HEENT:  AT, moist mucus membranes.  Trachea midline, no masses. ?Cardiovascular: No clubbing, cyanosis, or edema. ?Respiratory: Normal respiratory effort, no increased work of breathing. ?GI: Abdomen is soft, nontender, nondistended, no abdominal masses ?GU: No CVA tenderness.  ?Lymph: No cervical or inguinal lymphadenopathy. ?Skin: No rashes, bruises or suspicious lesions. ?Neurologic: Grossly intact, no focal deficits, moving all 4 extremities. ?Psychiatric: Normal mood and affect. ? ?Laboratory Data: ?Lab Results  ?Component Value Date  ? WBC 8.4 03/04/2021  ? HGB 15.3 03/04/2021  ? HCT 45.3 03/04/2021  ? MCV 86 03/04/2021  ? PLT 226 03/04/2021  ? ? ?Lab Results  ?Component Value Date  ? CREATININE 0.99 03/04/2021  ? ? ?No results found for: PSA ? ?No results found for: TESTOSTERONE ? ?Lab Results  ?Component Value Date  ? HGBA1C 5.6 03/04/2021  ? ? ?Urinalysis ?   ?Component Value Date/Time  ? [place] 02/20/2020 2218  ? APPEARANCEUR Clear 05/15/2021 1009  ? LABSPEC 1.023 02/20/2020 2218  ? PHURINE 5.0 02/20/2020 2218  ? GLUCOSEU Negative 05/15/2021 1009  ? [place] NEGATIVE 02/20/2020 2218  ? BILIRUBINUR Negative 05/15/2021 1009  ? [place] NEGATIVE 02/20/2020 2218  ? PROTEINUR Trace (A) 05/15/2021 1009  ? [place] NEGATIVE 02/20/2020 2218  ? NITRITE Negative 05/15/2021 1009  ? NITRITE NEGATIVE 02/20/2020 2218  ? LEUKOCYTESUR Negative 05/15/2021 1009  ? LEUKOCYTESUR NEGATIVE 02/20/2020 2218  ? ? ?Lab Results  ?Component Value Date  ? LABMICR See below: 04/19/2021  ? [place] None seen 04/19/2021  ? LABEPIT None seen 04/19/2021  ? BACTERIA None seen 04/19/2021  ? ? ?Pertinent  Imaging: ?CT stone study today: Images reviewed and discussed with the patient  ?Results for orders placed in visit on 05/15/21 ? ?DG Abd 1 View ? ?Narrative ?CLINICAL DATA:  Flank pain, history of kidney stones. ? ?EXAM: ?AB

## 2021-05-15 NOTE — Patient Instructions (Signed)
Dietary Guidelines to Help Prevent Kidney Stones Kidney stones are deposits of minerals and salts that form inside your kidneys. Your risk of developing kidney stones may be greater depending on your diet, your lifestyle, the medicines you take, and whether you have certain medical conditions. Most people can lower their chances of developing kidney stones by following the instructions below. Your dietitian may give you more specific instructions depending on your overall health and the type of kidney stones you tend to develop. What are tips for following this plan? Reading food labels  Choose foods with "no salt added" or "low-salt" labels. Limit your salt (sodium) intake to less than 1,500 mg a day. Choose foods with calcium for each meal and snack. Try to eat about 300 mg of calcium at each meal. Foods that contain 200-500 mg of calcium a serving include: 8 oz (237 mL) of milk, calcium-fortifiednon-dairy milk, and calcium-fortifiedfruit juice. Calcium-fortified means that calcium has been added to these drinks. 8 oz (237 mL) of kefir, yogurt, and soy yogurt. 4 oz (114 g) of tofu. 1 oz (28 g) of cheese. 1 cup (150 g) of dried figs. 1 cup (91 g) of cooked broccoli. One 3 oz (85 g) can of sardines or mackerel. Most people need 1,000-1,500 mg of calcium a day. Talk to your dietitian about how much calcium is recommended for you. Shopping Buy plenty of fresh fruits and vegetables. Most people do not need to avoid fruits and vegetables, even if these foods contain nutrients that may contribute to kidney stones. When shopping for convenience foods, choose: Whole pieces of fruit. Pre-made salads with dressing on the side. Low-fat fruit and yogurt smoothies. Avoid buying frozen meals or prepared deli foods. These can be high in sodium. Look for foods with live cultures, such as yogurt and kefir. Choose high-fiber grains, such as whole-wheat breads, oat bran, and wheat cereals. Cooking Do not add  salt to food when cooking. Place a salt shaker on the table and allow each person to add his or her own salt to taste. Use vegetable protein, such as beans, textured vegetable protein (TVP), or tofu, instead of meat in pasta, casseroles, and soups. Meal planning Eat less salt, if told by your dietitian. To do this: Avoid eating processed or pre-made food. Avoid eating fast food. Eat less animal protein, including cheese, meat, poultry, or fish, if told by your dietitian. To do this: Limit the number of times you have meat, poultry, fish, or cheese each week. Eat a diet free of meat at least 2 days a week. Eat only one serving each day of meat, poultry, fish, or seafood. When you prepare animal protein, cut pieces into small portion sizes. For most meat and fish, one serving is about the size of the palm of your hand. Eat at least five servings of fresh fruits and vegetables each day. To do this: Keep fruits and vegetables on hand for snacks. Eat one piece of fruit or a handful of berries with breakfast. Have a salad and fruit at lunch. Have two kinds of vegetables at dinner. Limit foods that are high in a substance called oxalate. These include: Spinach (cooked), rhubarb, beets, sweet potatoes, and Swiss chard. Peanuts. Potato chips, french fries, and baked potatoes with skin on. Nuts and nut products. Chocolate. If you regularly take a diuretic medicine, make sure to eat at least 1 or 2 servings of fruits or vegetables that are high in potassium each day. These include: Avocado. Banana. Orange, prune,   carrot, or tomato juice. Baked potato. Cabbage. Beans and split peas. Lifestyle  Drink enough fluid to keep your urine pale yellow. This is the most important thing you can do. Spread your fluid intake throughout the day. If you drink alcohol: Limit how much you use to: 0-1 drink a day for women who are not pregnant. 0-2 drinks a day for men. Be aware of how much alcohol is in your  drink. In the [place] one drink equals one 12 oz bottle of beer (355 mL), one 5 oz glass of wine (148 mL), or one 1 oz glass of hard liquor (44 mL). Lose weight if told by your health care provider. Work with your dietitian to find an eating plan and weight loss strategies that work best for you. General information Talk to your health care provider and dietitian about taking daily supplements. You may be told the following depending on your health and the cause of your kidney stones: Not to take supplements with vitamin C. To take a calcium supplement. To take a daily probiotic supplement. To take other supplements such as magnesium, fish oil, or vitamin B6. Take over-the-counter and prescription medicines only as told by your health care provider. These include supplements. What foods should I limit? Limit your intake of the following foods, or eat them as told by your dietitian. Vegetables Spinach. Rhubarb. Beets. Canned vegetables. Pickles. Olives. Baked potatoes with skin. Grains Wheat bran. Baked goods. Salted crackers. Cereals high in sugar. Meats and other proteins Nuts. Nut butters. Large portions of meat, poultry, or fish. Salted, precooked, or cured meats, such as sausages, meat loaves, and hot dogs. Dairy Cheese. Beverages Regular soft drinks. Regular vegetable juice. Seasonings and condiments Seasoning blends with salt. Salad dressings. Soy sauce. Ketchup. Barbecue sauce. Other foods Canned soups. Canned pasta sauce. Casseroles. Pizza. Lasagna. Frozen meals. Potato chips. French fries. The items listed above may not be a complete list of foods and beverages you should limit. Contact a dietitian for more information. What foods should I avoid? Talk to your dietitian about specific foods you should avoid based on the type of kidney stones you have and your overall health. Fruits Grapefruit. The item listed above may not be a complete list of foods and beverages you should  avoid. Contact a dietitian for more information. Summary Kidney stones are deposits of minerals and salts that form inside your kidneys. You can lower your risk of kidney stones by making changes to your diet. The most important thing you can do is drink enough fluid. Drink enough fluid to keep your urine pale yellow. Talk to your dietitian about how much calcium you should have each day, and eat less salt and animal protein as told by your dietitian. This information is not intended to replace advice given to you by your health care provider. Make sure you discuss any questions you have with your health care provider. Document Revised: 09/17/2020 Document Reviewed: 09/17/2020 Elsevier Patient Education  2023 Elsevier Inc.  

## 2021-05-15 NOTE — Progress Notes (Signed)
? ?05/15/2021 ?3:36 PM  ? ?Ronald Berger ?1945-09-26 ?[id number] ? ?Referring provider: Loman Brooklyn, FNP ?[address] [place],  [place] [address] ? ?Nephrolithiasis ? ? ?HPI: ?Ronald Berger is a 77yo here for followup for nephrolithiasis. He developed right upper quadrant pain 3 days ago. The pain ios sharp, intermittent, moderate and nonraditing. He states the pain feels similar to his prior stone events. No nausea or vomiting. No significant LUTS. CT stone study from today shows bilateral renal calculi but no ureteral calculi.  ? ? ?PMH: ?Past Medical History:  ?Diagnosis Date  ? Allergic rhinitis   ? Allergy   ? Asthma due to environmental allergies   ? 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  ? Bilateral carpal tunnel syndrome 12/08/2018  ? CAD, multiple vessel CARDIOLOGIST-  DR HILTY  ? a. NSTEMI 07/2013 - DES to mLCx, DES to prox OM1, DES to mLAD with nonobstructive RCA stenosis, EF 55-65%.  ? Chronic kidney disease   ? kidney stones   ? Dyslipidemia   ? Family history of bone cancer   ? GERD (gastroesophageal reflux disease)   ? History of kidney stones   ? History of non-ST elevation myocardial infarction (NSTEMI)   ? 07/ 2015  S/P  DES X3  ? History of palpitations   ? Hyperlipidemia   ? Hypertension   ? Insomnia 12/08/2018  ? x many years  ? Myocardial infarction [facility]) 2015  ? NSTEMI- 3 stents   ? OSA (obstructive sleep apnea)   ? cpap intolerant  ? Pneumonia due to COVID-19 virus   ? Right knee meniscal tear   ? S/P drug eluting coronary stent placement   ? 07/ 2015  x3  to mLCFX, OM1, mLAD  ? Sinus bradycardia   ? Sleep apnea   ? no cpap   ? ? ?Surgical History: ?Past Surgical History:  ?Procedure Laterality Date  ? CATARACT EXTRACTION, BILATERAL    ? COLONOSCOPY    ? >10 yrs ago-polyps per pt- we have no report   ? EXTRACORPOREAL SHOCK WAVE LITHOTRIPSY  x2 last one 2005  ? KNEE ARTHROSCOPY Right 05/02/2015  ? Procedure: RIGHT KNEE ARTHROSCOPY WITH medial  meniscal DEBRIDEMENT ;  Surgeon: Gaynelle Arabian, MD;  Location: [place];  Service: Orthopedics;  Laterality: Right;  ? LEFT HEART CATHETERIZATION WITH CORONARY ANGIOGRAM N/A 08/05/2013  ? Procedure: LEFT HEART CATHETERIZATION WITH CORONARY ANGIOGRAM;  Surgeon: Blane Ohara, MD;  Location: [facility] CATH LAB;  Service: Cardiovascular;  Laterality: N/A;  Promus DES's to  mLCFx,  OM1,  mLAD (total 3)/   mRCA 30%,  dLM  20-30%,  preserved LVSF, ef 55-65%  ? LUMBAR SPINE SURGERY  x3  last one 1980's  ? SHOULDER ARTHROSCOPY Right 2013  ? TONSILLECTOMY    ? ? ?Home Medications:  ?Allergies as of 05/15/2021   ? ?   Reactions  ? Bee Venom Shortness Of Breath  ? Oxycodone Nausea And Vomiting  ? Percocet [oxycodone-acetaminophen] Nausea And Vomiting  ? ?  ? ?  ?Medication List  ?  ? ?  ? Accurate as of May 15, 2021  3:36 PM. If you have any questions, ask your nurse or doctor.  ?  ?  ? ?  ? ?albuterol 108 (90 Base) MCG/ACT inhaler ?Commonly known as: VENTOLIN HFA ?INHALE 2 PUFFS INTO THE LUNGS EVERY 6  HOURS AS NEEDED FOR WHEEZING OR SHORTNESS OF BREATH. ?  ?  aspirin 81 MG chewable tablet ?Chew 1 tablet (81 mg total) by mouth daily. ?  ?atorvastatin 40 MG tablet ?Commonly known as: LIPITOR ?Take 1 tablet (40 mg total) by mouth daily. ?  ?cyclobenzaprine 5 MG tablet ?Commonly known as: FLEXERIL ?Take 1 tablet (5 mg total) by mouth 3 (three) times daily as needed for muscle spasms. ?Started by: Nicolette Bang, MD ?  ?Emergen-C Blue Pack ?Take 1 each by mouth daily. ?  ?EPINEPHrine 0.3 mg/0.3 mL Soaj injection ?Commonly known as: EPI-PEN ?INJECT 0.3 MG INTO THE MUSCLE AS NEEDED FOR ANAPHYLAXIS. ?  ?fluticasone 50 MCG/ACT nasal spray ?Commonly known as: FLONASE ?USE 2 SPRAYS IN EACH NOSTRIL EVERY DAY ?  ?fluticasone-salmeterol 250-50 MCG/ACT Aepb ?Commonly known as: ADVAIR ?Inhale 1 puff into the lungs in the morning and at bedtime. ?  ?HYDROcodone-acetaminophen 7.5-325 MG tablet ?Commonly known as: Norco ?Take 1  tablet by mouth every 6 (six) hours as needed for up to 5 days for moderate pain. ?Started by: Loman Brooklyn, FNP ?  ?levocetirizine 5 MG tablet ?Commonly known as: XYZAL ?Take 1 tablet (5 mg total) by mouth every evening. ?  ?Melatonin 10 MG Tabs ?Take 10 mg by mouth at bedtime. ?  ?methocarbamol 500 MG tablet ?Commonly known as: ROBAXIN ?Take 1 tablet (500 mg total) by mouth every 8 (eight) hours as needed for muscle spasms. ?  ?metoprolol tartrate 25 MG tablet ?Commonly known as: LOPRESSOR ?Take 0.5 tablets (12.5 mg total) by mouth 2 (two) times daily. ?  ?montelukast 10 MG tablet ?Commonly known as: SINGULAIR ?Take 1 tablet (10 mg total) by mouth at bedtime. ?  ?nitroGLYCERIN 0.4 MG SL tablet ?Commonly known as: NITROSTAT ?Place 1 tablet (0.4 mg total) under the tongue every 5 (five) minutes as needed for chest pain. ?  ?omeprazole 20 MG capsule ?Commonly known as: PRILOSEC ?Take 1 capsule (20 mg total) by mouth every evening. ?  ?predniSONE 20 MG tablet ?Commonly known as: DELTASONE ?2 po at same time daily for 5 days ?  ?silodosin 8 MG Caps capsule ?Commonly known as: RAPAFLO ?Take 1 capsule (8 mg total) by mouth daily with breakfast. ?  ?traZODone 50 MG tablet ?Commonly known as: DESYREL ?Take 1 tablet (50 mg total) by mouth at bedtime. ?  ? ?  ? ? ?Allergies:  ?Allergies  ?Allergen Reactions  ? Bee Venom Shortness Of Breath  ? Oxycodone Nausea And Vomiting  ? Percocet [Oxycodone-Acetaminophen] Nausea And Vomiting  ? ? ?Family History: ?Family History  ?Problem Relation Age of Onset  ? Coronary artery disease Mother   ?     CABG  ? CAD Sister   ? Bone cancer Sister   ? Alzheimer's disease Father   ? Aneurysm Brother   ?     brain  ? Heart failure Sister   ? Bone cancer Sister   ? Colon cancer Neg Hx   ? Colon polyps Neg Hx   ? Esophageal cancer Neg Hx   ? Rectal cancer Neg Hx   ? Stomach cancer Neg Hx   ? ? ?Social History:  reports that he quit smoking about 55 years ago. His smoking use included  cigarettes. He has never used smokeless tobacco. He reports current alcohol use. He reports that he does not use drugs. ? ?ROS: ?All other review of systems were reviewed and are negative except what is noted above in HPI ? ?Physical Exam: ?BP (!) 156/73   Pulse 65   ?Constitutional:  Alert and oriented,  No acute distress. ?HEENT: [place] AT, moist mucus membranes.  Trachea midline, no masses. ?Cardiovascular: No clubbing, cyanosis, or edema. ?Respiratory: Normal respiratory effort, no increased work of breathing. ?GI: Abdomen is soft, nontender, nondistended, no abdominal masses ?GU: No CVA tenderness.  ?Lymph: No cervical or inguinal lymphadenopathy. ?Skin: No rashes, bruises or suspicious lesions. ?Neurologic: Grossly intact, no focal deficits, moving all 4 extremities. ?Psychiatric: Normal mood and affect. ? ?Laboratory Data: ?Lab Results  ?Component Value Date  ? WBC 8.4 03/04/2021  ? HGB 15.3 03/04/2021  ? HCT 45.3 03/04/2021  ? MCV 86 03/04/2021  ? PLT 226 03/04/2021  ? ? ?Lab Results  ?Component Value Date  ? CREATININE 0.99 03/04/2021  ? ? ?No results found for: PSA ? ?No results found for: TESTOSTERONE ? ?Lab Results  ?Component Value Date  ? HGBA1C 5.6 03/04/2021  ? ? ?Urinalysis ?   ?Component Value Date/Time  ? [place] 02/20/2020 2218  ? APPEARANCEUR Clear 05/15/2021 1009  ? LABSPEC 1.023 02/20/2020 2218  ? PHURINE 5.0 02/20/2020 2218  ? GLUCOSEU Negative 05/15/2021 1009  ? [place] NEGATIVE 02/20/2020 2218  ? BILIRUBINUR Negative 05/15/2021 1009  ? [place] NEGATIVE 02/20/2020 2218  ? PROTEINUR Trace (A) 05/15/2021 1009  ? [place] NEGATIVE 02/20/2020 2218  ? NITRITE Negative 05/15/2021 1009  ? NITRITE NEGATIVE 02/20/2020 2218  ? LEUKOCYTESUR Negative 05/15/2021 1009  ? LEUKOCYTESUR NEGATIVE 02/20/2020 2218  ? ? ?Lab Results  ?Component Value Date  ? LABMICR See below: 04/19/2021  ? [place] None seen 04/19/2021  ? LABEPIT None seen 04/19/2021  ? BACTERIA None seen 04/19/2021  ? ? ?Pertinent  Imaging: ?CT stone study today: Images reviewed and discussed with the patient  ?Results for orders placed in visit on 05/15/21 ? ?DG Abd 1 View ? ?Narrative ?CLINICAL DATA:  Flank pain, history of kidney stones. ? ?EXAM: ?AB

## 2021-05-20 ENCOUNTER — Other Ambulatory Visit: Payer: Self-pay

## 2021-05-20 DIAGNOSIS — N200 Calculus of kidney: Secondary | ICD-10-CM

## 2021-05-20 NOTE — Telephone Encounter (Signed)
Patient called advising he wished to proceed with surgery and wanted to know is there was a stronger pain medication that could be called in.   ?

## 2021-05-22 NOTE — Telephone Encounter (Signed)
Patient calling back to check on medication request. ? ?Please advise.  ? ?Call back:  [phone] ? ?Thanks, ?Helene Kelp ?

## 2021-05-22 NOTE — Telephone Encounter (Signed)
Patient would like to proceed with litho this coming Tuesday. ?Also, asking for pain medication refill. ? ?Message sent to MD ?

## 2021-05-23 ENCOUNTER — Other Ambulatory Visit: Payer: Self-pay | Admitting: Urology

## 2021-05-23 DIAGNOSIS — R1011 Right upper quadrant pain: Secondary | ICD-10-CM

## 2021-05-23 MED ORDER — HYDROCODONE-ACETAMINOPHEN 10-325 MG PO TABS: 1.0000 | ORAL_TABLET | Freq: Four times a day (QID) | ORAL | 0 refills | Status: DC | PRN

## 2021-05-24 ENCOUNTER — Other Ambulatory Visit: Payer: Self-pay | Admitting: Family Medicine

## 2021-05-24 DIAGNOSIS — M25512 Pain in left shoulder: Secondary | ICD-10-CM

## 2021-05-27 ENCOUNTER — Telehealth: Payer: Self-pay

## 2021-05-27 ENCOUNTER — Encounter (HOSPITAL_COMMUNITY)
Admission: RE | Admit: 2021-05-27 | Discharge: 2021-05-27 | Disposition: A | Discharge status: Home / Self Care | Payer: Medicare HMO | Source: Ambulatory Visit | Attending: Urology | Admitting: Urology

## 2021-05-27 MED ORDER — ALFUZOSIN HCL ER 10 MG PO TB24: 10.0000 mg | ORAL_TABLET | Freq: Every day | ORAL | 3 refills | Status: DC

## 2021-05-27 NOTE — Telephone Encounter (Signed)
Received denial from [facility] on Silodosin.  ? ?Patient must try and fail two preferred drugs. ? ?Alfuzosin rx sent to pharmacy per Dr. Alyson Ingles to [facility]. ? ?Patient called and reviewed with patient to hold silodosin and try alfuzosin. Patient voiced understanding  ?

## 2021-05-28 ENCOUNTER — Ambulatory Visit (HOSPITAL_COMMUNITY): Payer: Medicare HMO

## 2021-05-28 ENCOUNTER — Ambulatory Visit (HOSPITAL_COMMUNITY)
Admission: RE | Admit: 2021-05-28 | Discharge: 2021-05-28 | Disposition: A | Discharge status: Home / Self Care | Payer: Medicare HMO | Attending: Urology | Admitting: Urology

## 2021-05-28 ENCOUNTER — Encounter (HOSPITAL_COMMUNITY)
Admission: RE | Disposition: A | Discharge status: Home / Self Care | Payer: Self-pay | Source: Home / Self Care | Attending: Urology

## 2021-05-28 DIAGNOSIS — E669 Obesity, unspecified: Secondary | ICD-10-CM | POA: Diagnosis not present

## 2021-05-28 DIAGNOSIS — I10 Essential (primary) hypertension: Secondary | ICD-10-CM | POA: Diagnosis not present

## 2021-05-28 DIAGNOSIS — M549 Dorsalgia, unspecified: Secondary | ICD-10-CM | POA: Diagnosis not present

## 2021-05-28 DIAGNOSIS — G4730 Sleep apnea, unspecified: Secondary | ICD-10-CM | POA: Insufficient documentation

## 2021-05-28 DIAGNOSIS — N200 Calculus of kidney: Secondary | ICD-10-CM | POA: Diagnosis not present

## 2021-05-28 HISTORY — PX: EXTRACORPOREAL SHOCK WAVE LITHOTRIPSY: SHX1557

## 2021-05-28 SURGERY — LITHOTRIPSY, ESWL
Anesthesia: LOCAL | Laterality: Right

## 2021-05-28 MED ORDER — DIPHENHYDRAMINE HCL 25 MG PO CAPS
25.0000 mg | ORAL_CAPSULE | ORAL | Status: AC
  Administered 2021-05-28: 25 mg via ORAL
  Filled 2021-05-28: qty 1

## 2021-05-28 MED ORDER — SODIUM CHLORIDE 0.9 % IV SOLN: INTRAVENOUS | Status: DC

## 2021-05-28 MED ORDER — DIAZEPAM 5 MG PO TABS
10.0000 mg | ORAL_TABLET | Freq: Once | ORAL | Status: AC
  Administered 2021-05-28: 10 mg via ORAL
  Filled 2021-05-28: qty 2

## 2021-05-28 MED ORDER — HYDROCODONE-ACETAMINOPHEN 10-325 MG PO TABS: 1.0000 | ORAL_TABLET | Freq: Four times a day (QID) | ORAL | 0 refills | Status: DC | PRN

## 2021-05-28 NOTE — Interval H&P Note (Signed)
History and Physical Interval Note: ? ?05/28/2021 ?8:46 AM ? ?Ronald Berger  has presented today for surgery, with the diagnosis of right renal calculus.  The various methods of treatment have been discussed with the patient and family. After consideration of risks, benefits and other options for treatment, the patient has consented to  Procedure(s): ?EXTRACORPOREAL SHOCK WAVE LITHOTRIPSY (ESWL) (Right) as a surgical intervention.  The patient's history has been reviewed, patient examined, no change in status, stable for surgery.  I have reviewed the patient's chart and labs.  Questions were answered to the patient's satisfaction.   ? ? ?Ronald Berger Ronald Berger ? ? ?

## 2021-05-29 ENCOUNTER — Emergency Department (HOSPITAL_BASED_OUTPATIENT_CLINIC_OR_DEPARTMENT_OTHER): Payer: Medicare HMO

## 2021-05-29 ENCOUNTER — Encounter (HOSPITAL_BASED_OUTPATIENT_CLINIC_OR_DEPARTMENT_OTHER): Payer: Self-pay

## 2021-05-29 ENCOUNTER — Other Ambulatory Visit: Payer: Self-pay

## 2021-05-29 ENCOUNTER — Encounter: Payer: Self-pay | Admitting: Family Medicine

## 2021-05-29 ENCOUNTER — Ambulatory Visit (INDEPENDENT_AMBULATORY_CARE_PROVIDER_SITE_OTHER): Payer: Medicare HMO | Admitting: Family Medicine

## 2021-05-29 ENCOUNTER — Emergency Department (HOSPITAL_BASED_OUTPATIENT_CLINIC_OR_DEPARTMENT_OTHER)
Admission: EM | Admit: 2021-05-29 | Discharge: 2021-05-29 | Disposition: A | Discharge status: Home / Self Care | Payer: Medicare HMO | Attending: Emergency Medicine | Admitting: Emergency Medicine

## 2021-05-29 ENCOUNTER — Emergency Department (HOSPITAL_BASED_OUTPATIENT_CLINIC_OR_DEPARTMENT_OTHER): Payer: Medicare HMO | Admitting: Radiology

## 2021-05-29 VITALS — BP 127/76 | HR 90 | Temp 98.8°F | Ht 69.0 in | Wt 217.0 lb

## 2021-05-29 DIAGNOSIS — M542 Cervicalgia: Secondary | ICD-10-CM

## 2021-05-29 DIAGNOSIS — Z7951 Long term (current) use of inhaled steroids: Secondary | ICD-10-CM | POA: Diagnosis not present

## 2021-05-29 DIAGNOSIS — I10 Essential (primary) hypertension: Secondary | ICD-10-CM | POA: Diagnosis not present

## 2021-05-29 DIAGNOSIS — Z20822 Contact with and (suspected) exposure to covid-19: Secondary | ICD-10-CM | POA: Insufficient documentation

## 2021-05-29 DIAGNOSIS — I700 Atherosclerosis of aorta: Secondary | ICD-10-CM | POA: Diagnosis not present

## 2021-05-29 DIAGNOSIS — M549 Dorsalgia, unspecified: Secondary | ICD-10-CM | POA: Diagnosis not present

## 2021-05-29 DIAGNOSIS — R1310 Dysphagia, unspecified: Secondary | ICD-10-CM | POA: Diagnosis not present

## 2021-05-29 DIAGNOSIS — D72829 Elevated white blood cell count, unspecified: Secondary | ICD-10-CM | POA: Insufficient documentation

## 2021-05-29 DIAGNOSIS — Z79899 Other long term (current) drug therapy: Secondary | ICD-10-CM | POA: Insufficient documentation

## 2021-05-29 DIAGNOSIS — R0789 Other chest pain: Secondary | ICD-10-CM | POA: Insufficient documentation

## 2021-05-29 DIAGNOSIS — N400 Enlarged prostate without lower urinary tract symptoms: Secondary | ICD-10-CM | POA: Diagnosis not present

## 2021-05-29 DIAGNOSIS — M25512 Pain in left shoulder: Secondary | ICD-10-CM | POA: Insufficient documentation

## 2021-05-29 DIAGNOSIS — R0602 Shortness of breath: Secondary | ICD-10-CM | POA: Insufficient documentation

## 2021-05-29 DIAGNOSIS — J45909 Unspecified asthma, uncomplicated: Secondary | ICD-10-CM | POA: Insufficient documentation

## 2021-05-29 DIAGNOSIS — I2510 Atherosclerotic heart disease of native coronary artery without angina pectoris: Secondary | ICD-10-CM | POA: Diagnosis not present

## 2021-05-29 DIAGNOSIS — K828 Other specified diseases of gallbladder: Secondary | ICD-10-CM | POA: Diagnosis not present

## 2021-05-29 DIAGNOSIS — K760 Fatty (change of) liver, not elsewhere classified: Secondary | ICD-10-CM | POA: Diagnosis not present

## 2021-05-29 DIAGNOSIS — R61 Generalized hyperhidrosis: Secondary | ICD-10-CM | POA: Insufficient documentation

## 2021-05-29 DIAGNOSIS — R079 Chest pain, unspecified: Secondary | ICD-10-CM

## 2021-05-29 DIAGNOSIS — Z7982 Long term (current) use of aspirin: Secondary | ICD-10-CM | POA: Diagnosis not present

## 2021-05-29 DIAGNOSIS — N200 Calculus of kidney: Secondary | ICD-10-CM | POA: Diagnosis not present

## 2021-05-29 DIAGNOSIS — G8929 Other chronic pain: Secondary | ICD-10-CM

## 2021-05-29 LAB — TROPONIN I (HIGH SENSITIVITY)
Troponin I (High Sensitivity): 5 ng/L (ref <18)
Troponin I (High Sensitivity): 4 ng/L (ref <18)

## 2021-05-29 LAB — BASIC METABOLIC PANEL
Anion gap: 14 (ref 5–15)
BUN: 15 mg/dL (ref 8–23)
CO2: 22 mmol/L (ref 22–32)
Calcium: 9.8 mg/dL (ref 8.9–10.3)
Chloride: 99 mmol/L (ref 98–111)
Creatinine, Ser: 1.07 mg/dL (ref 0.61–1.24)
GFR, Estimated: >60 mL/min (ref >60)
Glucose, Bld: 103 mg/dL — ABNORMAL HIGH (ref 70–99)
Potassium: 4.4 mmol/L (ref 3.5–5.1)
Sodium: 135 mmol/L (ref 135–145)

## 2021-05-29 LAB — RESP PANEL BY RT-PCR (FLU A&B, COVID) ARPGX2
Influenza A by PCR: NEGATIVE
Influenza B by PCR: NEGATIVE
SARS Coronavirus 2 by RT PCR: NEGATIVE

## 2021-05-29 LAB — CBC
HCT: 44.1 % (ref 39.0–52.0)
Hemoglobin: 14.2 g/dL (ref 13.0–17.0)
MCH: 27.9 pg (ref 26.0–34.0)
MCHC: 32.2 g/dL (ref 30.0–36.0)
MCV: 86.6 fL (ref 80.0–100.0)
Platelets: 278 10*3/uL (ref 150–400)
RBC: 5.09 MIL/uL (ref 4.22–5.81)
RDW: 12.7 % (ref 11.5–15.5)
WBC: 12.9 10*3/uL — ABNORMAL HIGH (ref 4.0–10.5)
nRBC: 0 % (ref 0.0–0.2)

## 2021-05-29 MED ORDER — ASPIRIN 325 MG PO TABS
325.0000 mg | ORAL_TABLET | Freq: Every day | ORAL | Status: DC
  Administered 2021-05-29: 325 mg via ORAL

## 2021-05-29 MED ORDER — METHYLPREDNISOLONE ACETATE 80 MG/ML IJ SUSP
60.0000 mg | Freq: Once | INTRAMUSCULAR | Status: AC
  Administered 2021-05-29: 60 mg via INTRAMUSCULAR

## 2021-05-29 MED ORDER — MORPHINE SULFATE (PF) 4 MG/ML IV SOLN
INTRAVENOUS | Status: AC
  Filled 2021-05-29: qty 1

## 2021-05-29 MED ORDER — NITROGLYCERIN 0.4 MG SL SUBL
0.4000 mg | SUBLINGUAL_TABLET | SUBLINGUAL | Status: DC | PRN
  Administered 2021-05-29 (×3): 0.4 mg via SUBLINGUAL
  Filled 2021-05-29: qty 1

## 2021-05-29 MED ORDER — IOHEXOL 350 MG/ML SOLN
100.0000 mL | Freq: Once | INTRAVENOUS | Status: AC | PRN
  Administered 2021-05-29: 100 mL via INTRAVENOUS

## 2021-05-29 MED ORDER — KETOROLAC TROMETHAMINE 15 MG/ML IJ SOLN
15.0000 mg | Freq: Once | INTRAMUSCULAR | Status: AC
Start: 2021-05-29 — End: 2021-05-29
  Administered 2021-05-29: 15 mg via INTRAVENOUS
  Filled 2021-05-29: qty 1

## 2021-05-29 MED ORDER — MORPHINE SULFATE (PF) 4 MG/ML IV SOLN
4.0000 mg | Freq: Once | INTRAVENOUS | Status: AC
  Administered 2021-05-29: 4 mg via INTRAVENOUS

## 2021-05-29 MED ORDER — ASPIRIN 325 MG PO TABS
ORAL_TABLET | ORAL | Status: AC
  Filled 2021-05-29: qty 1

## 2021-05-29 NOTE — ED Notes (Signed)
States nitro has not helped his pain ?

## 2021-05-29 NOTE — Discharge Instructions (Addendum)
If you develop recurrent, continued, or worsening chest pain, neck pain, headache, shortness of breath, fever, vomiting, abdominal or back pain, or any other new/concerning symptoms then return to the ER for evaluation.  ?

## 2021-05-29 NOTE — ED Notes (Signed)
He tells me thathe underwent lithotripsy YESTERDAY and "it went well" with no reported issues or problem(s) with anesthesia. He further tells me that he has some chronic shoulder pain issues for which he visited his pcp this morning. He tells me his pcp "gave me a steroid injection" and he was then d/c home. He states about an hour after arriving at home he noted significant upper chest pain radiating into his neck. He tells me that "My neck hurts so badly I can hardly moe my head". He is in tears as he tells me these things. ?

## 2021-05-29 NOTE — Progress Notes (Signed)
?  ? ?Subjective:  ?Patient ID: Ronald Berger, male    DOB: 09/27/1945, 76 y.o.   MRN: [id number] ? ?Patient Care Team: ?Loman Brooklyn, FNP as PCP - General (Family Medicine) ?Pixie Casino, MD as PCP - Cardiology (Cardiology) ?McKenzie, Candee Furbish, MD as Consulting Physician (Urology) ?Harlen Labs, MD as Referring Physician (Optometry) ?Valentina Shaggy, MD as Consulting Physician (Allergy and Immunology)  ? ?Chief Complaint:  Neck Pain (Recurring/Knot left shoulder) ? ? ?HPI: ?Ronald Berger is a 76 y.o. male presenting on 05/29/2021 for Neck Pain (Recurring/Knot left shoulder) ? ? ?Pt presents today for evaluation of recurrent neck and left shoulder pain. He has been taking Voltaren twice daily with some relief of symptoms. He was given exercises by his PCP which were beneficial. States he developed a renal stone and stopped doing the exercises and the pain in his neck and shoulder returned.  ? ?Neck Pain  ?This is a recurrent problem. The current episode started more than 1 month ago. The problem occurs constantly. The problem has been unchanged. The pain is associated with nothing. The pain is present in the left side, right side, occipital region, anterior neck and midline. The quality of the pain is described as shooting, stabbing and aching. The pain is severe. The symptoms are aggravated by bending, position, stress and twisting. The pain is Same all the time. Pertinent negatives include no chest pain, fever, headaches, leg pain, numbness, pain with swallowing, paresis, photophobia, syncope, tingling, trouble swallowing, visual change, weakness or weight loss. He has tried NSAIDs and home exercises for the symptoms. The treatment provided mild relief.  ?Shoulder Pain  ?The pain is present in the neck, left shoulder and right shoulder. This is a chronic problem. The current episode started more than 1 month ago. The problem occurs constantly. The problem has been unchanged. The quality of  the pain is described as aching, pounding and dull. Associated symptoms include a limited range of motion and stiffness. Pertinent negatives include no fever, inability to bear weight, itching, joint locking, joint swelling, numbness or tingling. The symptoms are aggravated by activity and lying down. He has tried NSAIDS and movement for the symptoms. The treatment provided mild relief.  ? ? ? ?Relevant past medical, surgical, family, and social history reviewed and updated as indicated.  ?Allergies and medications reviewed and updated. Data reviewed: Chart in Epic. ? ? ?Past Medical History:  ?Diagnosis Date  ? Allergic rhinitis   ? Allergy   ? Asthma due to environmental allergies   ? 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  ? Bilateral carpal tunnel syndrome 12/08/2018  ? CAD, multiple vessel CARDIOLOGIST-  DR HILTY  ? a. NSTEMI 07/2013 - DES to mLCx, DES to prox OM1, DES to mLAD with nonobstructive RCA stenosis, EF 55-65%.  ? Chronic kidney disease   ? kidney stones   ? Dyslipidemia   ? Family history of bone cancer   ? GERD (gastroesophageal reflux disease)   ? History of kidney stones   ? History of non-ST elevation myocardial infarction (NSTEMI)   ? 07/ 2015  S/P  DES X3  ? History of palpitations   ? Hyperlipidemia   ? Hypertension   ? Insomnia 12/08/2018  ? x many years  ? Myocardial infarction [facility]) 2015  ? NSTEMI- 3 stents   ? OSA (obstructive sleep apnea)   ? cpap intolerant  ? Pneumonia due to  COVID-19 virus   ? Right knee meniscal tear   ? S/P drug eluting coronary stent placement   ? 07/ 2015  x3  to mLCFX, OM1, mLAD  ? Sinus bradycardia   ? Sleep apnea   ? no cpap   ? ? ?Past Surgical History:  ?Procedure Laterality Date  ? CATARACT EXTRACTION, BILATERAL    ? COLONOSCOPY    ? >10 yrs ago-polyps per pt- we have no report   ? EXTRACORPOREAL SHOCK WAVE LITHOTRIPSY  x2 last one 2005  ? KNEE ARTHROSCOPY Right 05/02/2015  ? Procedure: RIGHT KNEE ARTHROSCOPY WITH medial  meniscal DEBRIDEMENT ;  Surgeon: Gaynelle Arabian, MD;  Location: [facility];  Service: Orthopedics;  Laterality: Right;  ? LEFT HEART CATHETERIZATION WITH CORONARY ANGIOGRAM N/A 08/05/2013  ? Procedure: LEFT HEART CATHETERIZATION WITH CORONARY ANGIOGRAM;  Surgeon: Blane Ohara, MD;  Location: [facility] CATH LAB;  Service: Cardiovascular;  Laterality: N/A;  Promus DES's to  mLCFx,  OM1,  mLAD (total 3)/   mRCA 30%,  dLM  20-30%,  preserved LVSF, ef 55-65%  ? LUMBAR SPINE SURGERY  x3  last one 1980's  ? SHOULDER ARTHROSCOPY Right 2013  ? TONSILLECTOMY    ? ? ?Social History  ? ?Socioeconomic History  ? Marital status: Married  ?  Spouse name: Joelene Millin  ? Number of children: 1  ? Years of education: 34  ? Highest education level: Some college, no degree  ?Occupational History  ? Occupation: [employer]  ?  Comment: Retired - prior Oceanographer  ?Tobacco Use  ? Smoking status: Former  ?  Years: 1.00  ?  Types: Cigarettes  ?  Quit date: 04/27/1966  ?  Years since quitting: 55.1  ? Smokeless tobacco: Never  ?Vaping Use  ? Vaping Use: Never used  ?Substance and Sexual Activity  ? Alcohol use: Yes  ?  Comment: occasional  ? Drug use: No  ? Sexual activity: Yes  ?Other Topics Concern  ? Not on file  ?Social History Narrative  ? Son lives with them  ? One level home  ? ?Social Determinants of Health  ? ?Financial Resource Strain: Low Risk   ? Difficulty of Paying Living Expenses: Not hard at all  ?Food Insecurity: No Food Insecurity  ? Worried About Charity fundraiser in the Last Year: Never true  ? Ran Out of Food in the Last Year: Never true  ?Transportation Needs: No Transportation Needs  ? Lack of Transportation (Medical): No  ? Lack of Transportation (Non-Medical): No  ?Physical Activity: Sufficiently Active  ? Days of Exercise per Week: 5 days  ? Minutes of Exercise per Session: 60 min  ?Stress: No Stress Concern Present  ? Feeling of Stress : Not at all  ?Social Connections: Moderately Isolated  ?  Frequency of Communication with Friends and Family: Once a week  ? Frequency of Social Gatherings with Friends and Family: Once a week  ? Attends Religious Services: Never  ? Active Member of Clubs or Organizations: Yes  ? Attends Archivist Meetings: More than 4 times per year  ? Marital Status: Married  ?Intimate Partner Violence: Not At Risk  ? Fear of Current or Ex-Partner: No  ? Emotionally Abused: No  ? Physically Abused: No  ? Sexually Abused: No  ? ? ?Outpatient Encounter Medications as of 05/29/2021  ?Medication Sig  ? albuterol (VENTOLIN HFA) 108 (90 Base) MCG/ACT inhaler INHALE 2 PUFFS INTO THE LUNGS EVERY 6  HOURS AS NEEDED FOR WHEEZING OR SHORTNESS OF BREATH.  ? alfuzosin (UROXATRAL) 10 MG 24 hr tablet Take 1 tablet (10 mg total) by mouth daily with breakfast. Please stop Silodosin '8mg'$  and start taking Alfuzosin  ? aspirin 81 MG chewable tablet Chew 1 tablet (81 mg total) by mouth daily.  ? atorvastatin (LIPITOR) 40 MG tablet Take 1 tablet (40 mg total) by mouth daily.  ? cyclobenzaprine (FLEXERIL) 5 MG tablet Take 1 tablet (5 mg total) by mouth 3 (three) times daily as needed for muscle spasms.  ? diclofenac (VOLTAREN) 75 MG EC tablet TAKE 1 TABLET BY MOUTH TWICE DAILY  ? EPINEPHRINE 0.3 mg/0.3 mL IJ SOAJ injection INJECT 0.3 MG INTO THE MUSCLE AS NEEDED FOR ANAPHYLAXIS.  ? fluticasone (FLONASE) 50 MCG/ACT nasal spray USE 2 SPRAYS IN EACH NOSTRIL EVERY DAY  ? fluticasone-salmeterol (ADVAIR) 250-50 MCG/ACT AEPB Inhale 1 puff into the lungs in the morning and at bedtime.  ? HYDROcodone-acetaminophen (NORCO) 10-325 MG tablet Take 1 tablet by mouth every 6 (six) hours as needed.  ? levocetirizine (XYZAL) 5 MG tablet Take 1 tablet (5 mg total) by mouth every evening.  ? Melatonin 10 MG TABS Take 10 mg by mouth at bedtime.  ? methocarbamol (ROBAXIN) 500 MG tablet Take 1 tablet (500 mg total) by mouth every 8 (eight) hours as needed for muscle spasms.  ? metoprolol tartrate (LOPRESSOR) 25 MG tablet  Take 0.5 tablets (12.5 mg total) by mouth 2 (two) times daily.  ? montelukast (SINGULAIR) 10 MG tablet Take 1 tablet (10 mg total) by mouth at bedtime.  ? Multiple Vitamins-Minerals (EMERGEN-C BLUE)

## 2021-05-29 NOTE — ED Provider Notes (Signed)
?[facility] EMERGENCY DEPT ?Provider Note ? ? ?CSN: [id number] ?Arrival date & time: 05/29/21  1254 ? ?  ? ?History ? ?Chief Complaint  ?Patient presents with  ? Chest Pain  ? ? ?Ronald Berger is a 76 y.o. male. ? ? ?Chest Pain ?Associated symptoms: shortness of breath   ? ?76 year old male with medical history significant for GERD, HTN, HLD, OSA asthma, CAD status post stent placement in 2015 with DES to the left circumflex, proximal OM1, mid LAD with nonobstructive RCA stenosis, EF 55 to 65% 2-3 weeks ago, had been having left sided shoulder pain. He states that he underwent lithotripsy yesterday on the right. He received a gluteal steroid injection at his PCP clinic this morning. Chest pain came on this morning, described as a soreness, sharp, no pressure sensation, with associated shortness of breath. No radiation of the pain to his back initially but does endorse some back pain now. Endorses some trouble swallowing. Has not had anything to eat today so no concern for food bolus. No nausea, vomiting, or diaphoresis. Two nights ago was diaphoretic but he also had a known kidney stone and associated that with pain related to his stone.  ? ?Home Medications ?Prior to Admission medications   ?Medication Sig Start Date End Date Taking? Authorizing Provider  ?albuterol (VENTOLIN HFA) 108 (90 Base) MCG/ACT inhaler INHALE 2 PUFFS INTO THE LUNGS EVERY 6  HOURS AS NEEDED FOR WHEEZING OR SHORTNESS OF BREATH. 04/08/21   Loman Brooklyn, FNP  ?alfuzosin (UROXATRAL) 10 MG 24 hr tablet Take 1 tablet (10 mg total) by mouth daily with breakfast. Please stop Silodosin '8mg'$  and start taking Alfuzosin 05/27/21   McKenzie, Candee Furbish, MD  ?aspirin 81 MG chewable tablet Chew 1 tablet (81 mg total) by mouth daily. 08/06/13   Delfina Redwood, MD  ?atorvastatin (LIPITOR) 40 MG tablet Take 1 tablet (40 mg total) by mouth daily. 02/12/21   Loman Brooklyn, FNP  ?cyclobenzaprine (FLEXERIL) 5 MG tablet Take 1 tablet (5 mg  total) by mouth 3 (three) times daily as needed for muscle spasms. 05/15/21   McKenzie, Candee Furbish, MD  ?diclofenac (VOLTAREN) 75 MG EC tablet TAKE 1 TABLET BY MOUTH TWICE DAILY 05/24/21   Loman Brooklyn, FNP  ?EPINEPHRINE 0.3 mg/0.3 mL IJ SOAJ injection INJECT 0.3 MG INTO THE MUSCLE AS NEEDED FOR ANAPHYLAXIS. 05/17/20   Loman Brooklyn, FNP  ?fluticasone (FLONASE) 50 MCG/ACT nasal spray USE 2 SPRAYS IN [facility] NOSTRIL EVERY DAY 10/18/20   Loman Brooklyn, FNP  ?fluticasone-salmeterol (ADVAIR) 250-50 MCG/ACT AEPB Inhale 1 puff into the lungs in the morning and at bedtime. 02/12/21   Loman Brooklyn, FNP  ?HYDROcodone-acetaminophen (NORCO) 10-325 MG tablet Take 1 tablet by mouth every 6 (six) hours as needed. 05/28/21   Stoneking, Reece Leader., MD  ?levocetirizine (XYZAL) 5 MG tablet Take 1 tablet (5 mg total) by mouth every evening. 02/12/21   Loman Brooklyn, FNP  ?Melatonin 10 MG TABS Take 10 mg by mouth at bedtime.    [provider]  ?methocarbamol (ROBAXIN) 500 MG tablet Take 1 tablet (500 mg total) by mouth every 8 (eight) hours as needed for muscle spasms. 05/13/21   Dettinger, Fransisca Kaufmann, MD  ?metoprolol tartrate (LOPRESSOR) 25 MG tablet Take 0.5 tablets (12.5 mg total) by mouth 2 (two) times daily. 06/14/20   Loman Brooklyn, FNP  ?montelukast (SINGULAIR) 10 MG tablet Take 1 tablet (10 mg total) by mouth at bedtime. 02/12/21   Blanch Media,  Shireen Quan, FNP  ?Multiple Vitamins-Minerals (EMERGEN-C BLUE) PACK Take 1 each by mouth daily.    [provider]  ?nitroGLYCERIN (NITROSTAT) 0.4 MG SL tablet Place 1 tablet (0.4 mg total) under the tongue every 5 (five) minutes as needed for chest pain. 02/12/21   Loman Brooklyn, FNP  ?omeprazole (PRILOSEC) 20 MG capsule Take 1 capsule (20 mg total) by mouth every evening. 06/14/20   Loman Brooklyn, FNP  ?predniSONE (DELTASONE) 20 MG tablet 2 po at same time daily for 5 days 05/13/21   Dettinger, Fransisca Kaufmann, MD  ?traZODone (DESYREL) 50 MG tablet Take 1 tablet (50 mg total)  by mouth at bedtime. 02/12/21   Loman Brooklyn, FNP  ?   ? ?Allergies    ?Bee venom, Oxycodone, and Percocet [oxycodone-acetaminophen]   ? ?Review of Systems   ?Review of Systems  ?Respiratory:  Positive for shortness of breath.   ?Cardiovascular:  Positive for chest pain.  ?All other systems reviewed and are negative. ? ?Physical Exam ?Updated Vital Signs ?BP (!) 174/87   Pulse (!) 102   Temp 98.6 ?F (37 ?C) (Oral)   Resp 15   SpO2 95%  ?Physical Exam ?Vitals and nursing note reviewed.  ?Constitutional:   ?   General: He is in acute distress.  ?   Appearance: He is well-developed.  ?   Comments: Mild distress from pain, no diaphoresis, tachypneic  ?HENT:  ?   Head: Normocephalic and atraumatic.  ?Eyes:  ?   Conjunctiva/sclera: Conjunctivae normal.  ?Cardiovascular:  ?   Rate and Rhythm: Normal rate and regular rhythm.  ?   Pulses:     ?     Radial pulses are 2+ on the right side and 2+ on the left side.  ?   Comments: No carotid bruit bilaterally, no pitting edema, radial pulses 2+ bilaterally ?Pulmonary:  ?   Effort: Pulmonary effort is normal. Tachypnea present. No respiratory distress.  ?   Breath sounds: Normal breath sounds.  ?Chest:  ?   Comments: Left-sided chest wall tenderness to palpation, reproducible ?Abdominal:  ?   Palpations: Abdomen is soft.  ?   Tenderness: There is no abdominal tenderness. There is no guarding or rebound.  ?Musculoskeletal:     ?   General: No swelling.  ?   Cervical back: Neck supple.  ?   Right lower leg: No edema.  ?   Left lower leg: No edema.  ?Skin: ?   General: Skin is warm and dry.  ?   Capillary Refill: Capillary refill takes less than 2 seconds.  ?Neurological:  ?   Mental Status: He is alert.  ?Psychiatric:     ?   Mood and Affect: Mood normal.  ? ? ?ED Results / Procedures / Treatments   ?Labs ?(all labs ordered are listed, but only abnormal results are displayed) ?Labs Reviewed  ?BASIC METABOLIC PANEL - Abnormal; Notable for the following components:  ?    Result  Value  ? Glucose, Bld 103 (*)   ? All other components within normal limits  ?CBC - Abnormal; Notable for the following components:  ? WBC 12.9 (*)   ? All other components within normal limits  ?RESP PANEL BY RT-PCR (FLU A&B, COVID) [id number]  ?TROPONIN I (HIGH SENSITIVITY)  ?TROPONIN I (HIGH SENSITIVITY)  ? ? ?EKG ?EKG Interpretation ? ?Date/Time:  Wednesday May 29 2021 13:03:40 EDT ?Ventricular Rate:  92 ?PR Interval:  196 ?QRS Duration: 103 ?QT Interval:  353 ?QTC Calculation: 437 ?R Axis:   19 ?Text Interpretation: Sinus rhythm Borderline T abnormalities, anterior leads Confirmed by Regan Lemming ([id number]) on 05/29/2021 1:14:04 PM ? ?Radiology ?DG Abd 1 View ? ?Result Date: 05/28/2021 ?CLINICAL DATA:  Pre right lithotripsy EXAM: ABDOMEN - 1 VIEW COMPARISON:  Radiograph dated May 15, 2021 FINDINGS: Nonobstructive bowel gas pattern. Calcification of the right hemithorax measuring 10 mm projecting over the expected area of the lower pole of the right kidney and calcification of the left hemithorax measuring 5 mm projecting over the expected area of the lower pole of the left kidney, unchanged when compared with prior. IMPRESSION: Unchanged bilateral renal stones. Electronically Signed   By: Yetta Glassman M.D.   On: 05/28/2021 08:10  ? ?DG Chest Port 1 View ? ?Result Date: 05/29/2021 ?CLINICAL DATA:  Chest pains. EXAM: PORTABLE CHEST 1 VIEW COMPARISON:  Chest radiograph 03/05/2021 FINDINGS: Single-view of the chest demonstrates coarse lung markings that are likely chronic. There is no focal airspace disease or pulmonary edema. Heart and mediastinum are within normal limits. Negative for a pneumothorax. No acute bone abnormality. IMPRESSION: No active disease. Electronically Signed   By: Markus Daft M.D.   On: 05/29/2021 13:14  ? ?CT Angio Chest/Abd/Pel for Dissection W and/or Wo Contrast ? ?Result Date: 05/29/2021 ?CLINICAL DATA:  Neck pain, left shoulder pain, clinical suspicion of acute aortic syndrome EXAM: CT  ANGIOGRAPHY CHEST, ABDOMEN AND PELVIS TECHNIQUE: Non-contrast CT of the chest was initially obtained. Multidetector CT imaging through the chest, abdomen and pelvis was performed using the standard protocol during

## 2021-05-29 NOTE — ED Provider Notes (Signed)
3:56 PM Patient's CTA is overall unremarkable.  I personally viewed these images.  No dissection seen.  Second troponin is also normal.  No indication of MI.  I doubt this is unstable angina.  On my examination his primary complaint of pain at the base of his neck, around C7.  Radiates up into his head, around to his chest and down his left arm.  At this point, there does not appear to be an emergent condition but he still in some pain.  The nitroglycerin given but did not do anything.  We will try a dose of Toradol. ? ?4:51 PM Patient's headache is gone with the toradol. He's feeling better and feels well enough for discharge. Recommend short course of NSAIDs. Otherwise follow up with PCP. Of note, HR went up to 120s after the nitroglycerin, and now is trending back down. Stable for d/c ?  ?Sherwood Gambler, MD ?05/29/21 1651 ? ?

## 2021-05-30 ENCOUNTER — Ambulatory Visit (INDEPENDENT_AMBULATORY_CARE_PROVIDER_SITE_OTHER): Payer: Medicare HMO

## 2021-05-30 ENCOUNTER — Telehealth: Payer: Self-pay | Admitting: Family Medicine

## 2021-05-30 ENCOUNTER — Ambulatory Visit (INDEPENDENT_AMBULATORY_CARE_PROVIDER_SITE_OTHER): Payer: Medicare HMO | Admitting: Family Medicine

## 2021-05-30 ENCOUNTER — Other Ambulatory Visit: Payer: Self-pay | Admitting: Family Medicine

## 2021-05-30 ENCOUNTER — Encounter: Payer: Self-pay | Admitting: Family Medicine

## 2021-05-30 VITALS — BP 104/55 | HR 66 | Temp 98.0°F | Ht 69.0 in | Wt 216.2 lb

## 2021-05-30 DIAGNOSIS — M47812 Spondylosis without myelopathy or radiculopathy, cervical region: Secondary | ICD-10-CM

## 2021-05-30 DIAGNOSIS — M542 Cervicalgia: Secondary | ICD-10-CM

## 2021-05-30 DIAGNOSIS — M25512 Pain in left shoulder: Secondary | ICD-10-CM | POA: Diagnosis not present

## 2021-05-30 MED ORDER — PREDNISONE 20 MG PO TABS: 40.0000 mg | ORAL_TABLET | Freq: Every day | ORAL | 0 refills | Status: AC

## 2021-05-30 MED ORDER — DICLOFENAC SODIUM 75 MG PO TBEC: 75.0000 mg | DELAYED_RELEASE_TABLET | Freq: Two times a day (BID) | ORAL | 1 refills | Status: DC

## 2021-05-30 NOTE — Telephone Encounter (Signed)
Referral placed as requested and pt is aware. ?

## 2021-05-30 NOTE — Telephone Encounter (Signed)
Ok to place referral for patient ?

## 2021-05-30 NOTE — Patient Instructions (Signed)
Cervical Sprain A cervical sprain is a stretch or tear in one or more of the ligaments in the neck. Ligaments are the tissues that connect bones. Cervical sprains can range from mild to severe. Severe cervical sprains can cause the spinal bones (vertebrae) in the neck to be unstable. This can result in spinal cord damage and in serious nervous system problems. The time that it takes for a cervical sprain to heal depends on the cause and extent of the injury. Most cervical sprains heal in 4-6 weeks. What are the causes? Cervical sprains may be caused by trauma, such as an injury from a motor vehicle accident, a fall, or a sudden forward and backward whipping movement of the head and neck (whiplash injury). Mild cervical sprains may be caused by wear and tear over time. What increases the risk? The following factors may make you more likely to develop this condition: Participating in activities that have a high risk of trauma to the neck. These include contact sports, auto racing, gymnastics, and diving. Taking risks when driving or riding in a motor vehicle. Osteoarthritis of the spine. Poor strength and flexibility of the neck. A previous neck injury. Poor posture. Spending long periods in certain positions that put stress on the neck, such as sitting at a computer for a long time. What are the signs or symptoms? Symptoms of this condition include: Pain, soreness, stiffness, tenderness, swelling, or a burning sensation in the front, back, or sides of the neck, shoulders, or upper back. Sudden tightening of neck muscles (spasms). Limited ability to move the neck. Headache. Dizziness. Nausea or vomiting. Weakness, numbness, or tingling in a hand or an arm. Symptoms may develop right away after injury, or they may develop over a few days. In some cases, symptoms may go away with treatment and return (recur) over time. How is this diagnosed? This condition may be diagnosed based on: Your  medical history. Your symptoms. Any recent injuries or known neck problems that you have, such as arthritis in the neck. A physical exam. Imaging tests, such as X-rays, MRI, and CT scan. How is this treated? This condition is treated by resting and icing the injured area and doing physical therapy exercises. Heat therapy may be used 2-3 days after the injury occurred if there is no swelling. Depending on the severity of your condition, treatment may also include: Keeping your neck in place (immobilized) for periods of time. This may be done using: A cervical collar. This supports your chin and the back of your head. A cervical traction device. This is a sling that holds up your head. The device removes weight and pressure from your neck, and it may help to relieve pain. Medicines that help to relieve pain and inflammation. Medicines that help to relax your muscles (muscle relaxants). Surgery. This is rare. Follow these instructions at home: Medicines  Take over-the-counter and prescription medicines only as told by your health care provider. Ask your health care provider if the medicine prescribed to you: Requires you to avoid driving or using heavy machinery. Can cause constipation. You may need to take these actions to prevent or treat constipation: Drink enough fluid to keep your urine pale yellow. Take over-the-counter or prescription medicines. Eat foods that are high in fiber, such as beans, whole grains, and fresh fruits and vegetables. Limit foods that are high in fat and processed sugars, such as fried or sweet foods. If you have a cervical collar: Wear the collar as told by your   health care provider. Do not remove it unless told. Ask before making any adjustments to your collar. If you have long hair, keep it outside of the collar. Ask your health care provider if you may remove the collar for cleaning and bathing. If so: Follow instructions about how to remove it  safely. Clean it by hand with mild soap and water and air-dry it completely. If your collar has removable pads, remove them every 1-2 days and wash them by hand with soap and water. Let them air-dry completely before putting them back in the collar. Tell your health care provider if your skin under the collar has irritation or sores. Managing pain, stiffness, and swelling     If directed, use a cervical traction device as told. If directed, put ice on the affected area. To do this: Put ice in a plastic bag. Place a towel between your skin and the bag. Leave the ice on for 20 minutes, 2-3 times a day. If directed, apply heat to the affected area before you do your physical therapy or as often as told by your health care provider. Use the heat source that your health care provider recommends, such as a moist heat pack or a heating pad. Place a towel between your skin and the heat source. Leave the heat on for 20-30 minutes. Remove the heat if your skin turns bright red. This is especially important if you are unable to feel pain, heat, or cold. You may have a greater risk of getting burned. Activity Do not drive while wearing a cervical collar. If you do not have a cervical collar, ask if it is safe to drive while your neck heals. Do not lift anything that is heavier than 10 lb (4.5 kg), or the limit that you are told, until your health care provider says that it is safe. Rest as told by your health care provider. If physical therapy was prescribed, do exercises as told by your health care provider or physical therapist. Return to your normal activities as told by your health care provider. Avoid positions and activities that make your symptoms worse. Ask your health care provider what activities are safe for you. General instructions Do not use any products that contain nicotine or tobacco, such as cigarettes, e-cigarettes, and chewing tobacco. These can delay healing. If you need help  quitting, ask your health care provider. Keep all follow-up visits as told by your health care provider or physical therapist. This is important. How is this prevented? To prevent a cervical sprain from happening again: Use and maintain good posture. Make any needed adjustments to your workstation to help you do this. Exercise regularly as told by your health care provider or physical therapist. Avoid risky activities that may cause a cervical sprain. Contact a health care provider if you have: Symptoms that get worse or do not get better after 2 weeks of treatment. Pain that gets worse or does not get better with medicine. New, unexplained symptoms. Sores or irritated skin on your neck from wearing your cervical collar. Get help right away if: You have severe pain. You develop numbness, tingling, or weakness in any part of your body. You cannot move a part of your body (you have paralysis). You have neck pain along with severe dizziness or headache. Summary A cervical sprain is a stretch or tear in one or more of the ligaments in the neck. Cervical sprains may be caused by trauma, such as an injury from a motor vehicle   accident, a fall, or a sudden forward and backward whipping movement of the head and neck (whiplash injury). Symptoms may develop right away after injury, or they may develop over a few days. This condition may be treated with rest, ice, heat, medicines, physical therapy, and surgery. This information is not intended to replace advice given to you by your health care provider. Make sure you discuss any questions you have with your health care provider. Document Revised: 09/15/2018 Document Reviewed: 09/15/2018 Elsevier Patient Education  2023 [employer] Inc.  

## 2021-05-30 NOTE — Progress Notes (Signed)
? ?Established Patient Office Visit ? ?Subjective   ?Patient ID: Ronald Berger, male    DOB: [birth year]  Age: 76 y.o. MRN: [id number] ? ?Chief Complaint  ?Patient presents with  ? Neck Pain  ? ? ?HPI ?Ronald Berger has a history of chronic next pain and has had pain for the last month. The pain is on both sides and runs up into his head. The pain is constant, sharp, and achy. The pain is worse with bending or moving his neck. He takes diclofenac regularly for his chronic left shoulder pain. Yesterday he had increased pain and decreased ROM in his neck. He was seen in our office yesterday and given a steroid IM injection. When his symptoms didn't improve over the next few hours, he went to the ER at [facility] as he also had a headache. They did labs and a chest CT to rule out a cardiac cause. He was given a tordal injection and this immediately relieved his headache. He reports that he does have more ROM in his neck today compared to yesterday. There is still pain with extension and rotation of his neck. Denies injury, numbness, tingling, HA, fever, visual disturbances, or difficulty swallowing. He has also been taking flexeril with mild improvement.  ? ?Past Medical History:  ?Diagnosis Date  ? Allergic rhinitis   ? Allergy   ? Asthma due to environmental allergies   ? 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  ? Bilateral carpal tunnel syndrome 12/08/2018  ? CAD, multiple vessel CARDIOLOGIST-  DR HILTY  ? a. NSTEMI 07/2013 - DES to mLCx, DES to prox OM1, DES to mLAD with nonobstructive RCA stenosis, EF 55-65%.  ? Chronic kidney disease   ? kidney stones   ? Dyslipidemia   ? Family history of bone cancer   ? GERD (gastroesophageal reflux disease)   ? History of kidney stones   ? History of non-ST elevation myocardial infarction (NSTEMI)   ? 07/ 2015  S/P  DES X3  ? History of palpitations   ? Hyperlipidemia   ? Hypertension   ? Insomnia 12/08/2018  ? x many years  ? Myocardial  infarction [facility]) 2015  ? NSTEMI- 3 stents   ? OSA (obstructive sleep apnea)   ? cpap intolerant  ? Pneumonia due to COVID-19 virus   ? Right knee meniscal tear   ? S/P drug eluting coronary stent placement   ? 07/ 2015  x3  to mLCFX, OM1, mLAD  ? Sinus bradycardia   ? Sleep apnea   ? no cpap   ? ?  ? ?ROS ?As per HPI.  ?  ?Objective:  ?  ? ?BP (!) 104/55   Pulse 66   Temp 98 ?F (36.7 ?C) (Temporal)   Ht '5\' 9"'$  (1.753 m)   Wt 216 lb 4 oz (98.1 kg)   SpO2 97%   BMI 31.93 kg/m?  ?BP Readings from Last 3 Encounters:  ?05/30/21 (!) 104/55  ?05/29/21 (!) 144/89  ?05/29/21 127/76  ? ?  ? ?Physical Exam ?Vitals and nursing note reviewed.  ?Constitutional:   ?   General: He is not in acute distress. ?   Appearance: He is not ill-appearing, toxic-appearing or diaphoretic.  ?HENT:  ?   Head: Normocephalic and atraumatic.  ?Eyes:  ?   Extraocular Movements: Extraocular movements intact.  ?   Pupils: Pupils are equal, round, and reactive to light.  ?Pulmonary:  ?   Effort:  Pulmonary effort is normal. No respiratory distress.  ?Musculoskeletal:  ?   Cervical back: No edema, erythema or rigidity. Pain with movement and muscular tenderness present. No spinous process tenderness. Decreased range of motion.  ?   Right lower leg: No edema.  ?   Left lower leg: No edema.  ?Lymphadenopathy:  ?   Cervical: No cervical adenopathy.  ?Skin: ?   General: Skin is warm and dry.  ?Neurological:  ?   General: No focal deficit present.  ?   Mental Status: He is alert and oriented to person, place, and time.  ?   Sensory: No sensory deficit.  ?   Motor: No weakness.  ?   Gait: Gait normal.  ?Psychiatric:     ?   Mood and Affect: Mood normal.     ?   Behavior: Behavior normal.     ?   Thought Content: Thought content normal.     ?   Judgment: Judgment normal.  ? ? ? ?No results found for any visits on 05/30/21. ? ? ? ?The 10-year ASCVD risk score (Arnett DK, et al., 2019) is: 13.4% ? ?  ?Assessment & Plan:  ? ?[name] was seen today for neck  pain. ? ?Diagnoses and all orders for this visit: ? ?Cervicalgia ?Chronic with recent worsening. No alarm signs. ? Sprain. Negative chest CT and cardiac work up in ER. Refills provided on diclofenac. Continue flexeril. Had Toradol and steroid IM injections yesterday. Has been referred to PT but does not have appt yet. Xray ordered today, will notify patient of results and recommendations pending report.   ?-     diclofenac (VOLTAREN) 75 MG EC tablet; Take 1 tablet (75 mg total) by mouth 2 (two) times daily. ?-     DG Cervical Spine Complete; Future ? ?Acute pain of left shoulder ?Refills provided.  ?-     diclofenac (VOLTAREN) 75 MG EC tablet; Take 1 tablet (75 mg total) by mouth 2 (two) times daily. ? ?Return if symptoms worsen or fail to improve.  ? ?The patient indicates understanding of these issues and agrees with the plan. ? ? ?Gwenlyn Perking, FNP ? ?

## 2021-05-30 NOTE — Telephone Encounter (Signed)
Aware it was sent to [place].  ?

## 2021-05-30 NOTE — Addendum Note (Signed)
Addended by: Milas Hock on: 05/30/2021 05:32 PM ? ? Modules accepted: Orders ? ?

## 2021-05-30 NOTE — Telephone Encounter (Signed)
Pt had a visit with Tiffany morgan earlier today. ? ?Called to let Tiffany know that he would like for his Referral to be sent to [facility]. ? ?[address] [place], [place] [address] ? ?[phone] ?

## 2021-06-04 ENCOUNTER — Ambulatory Visit: Payer: Medicare HMO | Admitting: Family Medicine

## 2021-06-11 ENCOUNTER — Other Ambulatory Visit: Payer: Self-pay | Admitting: Physician Assistant

## 2021-06-11 ENCOUNTER — Other Ambulatory Visit: Payer: Self-pay

## 2021-06-11 ENCOUNTER — Other Ambulatory Visit: Payer: Self-pay | Admitting: Urology

## 2021-06-11 ENCOUNTER — Ambulatory Visit (HOSPITAL_COMMUNITY)
Admission: RE | Admit: 2021-06-11 | Discharge: 2021-06-11 | Disposition: A | Discharge status: Home / Self Care | Payer: Medicare HMO | Source: Ambulatory Visit | Attending: Physician Assistant | Admitting: Physician Assistant

## 2021-06-11 ENCOUNTER — Ambulatory Visit: Payer: Medicare HMO | Admitting: Physician Assistant

## 2021-06-11 VITALS — BP 161/82 | HR 76 | Ht 69.0 in | Wt 213.0 lb

## 2021-06-11 DIAGNOSIS — N401 Enlarged prostate with lower urinary tract symptoms: Secondary | ICD-10-CM

## 2021-06-11 DIAGNOSIS — N138 Other obstructive and reflux uropathy: Secondary | ICD-10-CM

## 2021-06-11 DIAGNOSIS — R972 Elevated prostate specific antigen [PSA]: Secondary | ICD-10-CM

## 2021-06-11 DIAGNOSIS — N200 Calculus of kidney: Secondary | ICD-10-CM | POA: Diagnosis not present

## 2021-06-11 NOTE — Progress Notes (Signed)
Assessment: 1. Kidney stones - Ultrasound renal complete; Future - Calculi, with Photograph (to Clinical Lab)  2. Benign prostatic hyperplasia with urinary obstruction  3. Elevated PSA    Plan: Stone analysis ordered.  Patient is given stone diet recommendations.  As he did not have his KUB prior to appointment, he will go do that now.  Renal ultrasound scheduled for 6 weeks postop and he will keep his scheduled appointment for PSA recheck.  Will go over renal ultrasound, discuss intervention for left renal calculus, discussed PSA result at that visit.  Chief Complaint: Recheck stones  HPI: Ronald NAZAIRE is a 76 y.o. male who presents for continued evaluation of nephrolithiasis. Pt is s/p EWSL on the right. Date of procedure 05/28/21.  He has additional 4 mm stone in the left lower pole.  He has been doing well since lithotripsy and denies any kind of pain.  His past significant numbers of fragments and denies hematuria except postop day 1.  Continues Uroxatrol and actually reports that his lower urinary tract symptoms have improved significantly since beginning this a month ago.  IPSS = 19 quality-of-life 4 He is gone from voiding 4-5 times at night to once or twice.  Follow-up already scheduled for PSA recheck for history of elevation.  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  Urine is clear   Portions of the above documentation were copied from a prior visit for review purposes only.  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   Bilateral carpal  tunnel syndrome 12/08/2018   CAD, multiple vessel CARDIOLOGIST-  DR HILTY   a. NSTEMI 07/2013 - DES to mLCx, DES to prox OM1, DES to mLAD with nonobstructive RCA stenosis, EF 55-65%.   Chronic kidney disease    kidney stones    Dyslipidemia    Family history of bone cancer    GERD (gastroesophageal reflux disease)    History of kidney stones    History of non-ST elevation myocardial infarction (NSTEMI)    07/ 2015  S/P  DES X3   History of palpitations    Hyperlipidemia    Hypertension    Insomnia 12/08/2018   x many years   Myocardial infarction ([place]) 2015   NSTEMI- 3 stents    OSA (obstructive sleep apnea)    cpap intolerant   Pneumonia due to COVID-19 virus    Right knee meniscal tear    S/P drug eluting coronary stent placement    07/ 2015  x3  to mLCFX, OM1, mLAD   Sinus bradycardia    Sleep apnea    no cpap     PSH: Past Surgical History:  Procedure Laterality Date   CATARACT EXTRACTION, BILATERAL     COLONOSCOPY     >10 yrs ago-polyps per pt- we have no report    EXTRACORPOREAL SHOCK WAVE LITHOTRIPSY  x2 last one 2005   EXTRACORPOREAL SHOCK WAVE LITHOTRIPSY Right 05/28/2021   Procedure: EXTRACORPOREAL SHOCK WAVE LITHOTRIPSY (ESWL);  Surgeon: Primus Bravo., MD;  Location: [facility];  Service: Urology;  Laterality:  Right;   KNEE ARTHROSCOPY Right 05/02/2015   Procedure: RIGHT KNEE ARTHROSCOPY WITH medial meniscal DEBRIDEMENT ;  Surgeon: Gaynelle Arabian, MD;  Location[facility];  Service: Orthopedics;  Laterality: Right;   LEFT HEART CATHETERIZATION WITH CORONARY ANGIOGRAM N/A 08/05/2013   Procedure: LEFT HEART CATHETERIZATION WITH CORONARY ANGIOGRAM;  Surgeon: Blane Ohara, MD;  Location: [facility] CATH LAB;  Service: Cardiovascular;  Laterality: N/A;  Promus DES's to  mLCFx,  OM1,  mLAD (total 3)/   mRCA 30%,  dLM  20-30%,  preserved LVSF, ef 55-65%   LITHOTRIPSY     LUMBAR SPINE SURGERY  x3  last one 1980's   SHOULDER ARTHROSCOPY Right 2013    TONSILLECTOMY      SH: Social History   Tobacco Use   Smoking status: Former    Years: 1.00    Types: Cigarettes    Quit date: 04/27/1966    Years since quitting: 55.1   Smokeless tobacco: Never  Vaping Use   Vaping Use: Never used  Substance Use Topics   Alcohol use: Yes    Comment: occasional   Drug use: No    ROS: See HPI  PE: BP (!) 161/82   Pulse 76   Ht '5\' 9"'$  (1.753 m)   Wt 213 lb (96.6 kg)   BMI 31.45 kg/m  GENERAL APPEARANCE:  Well appearing, well developed, well nourished, NAD HEENT:  Atraumatic, normocephalic NECK:  Supple. Trachea midline ABDOMEN:  Soft, non-tender, no masses EXTREMITIES:  Moves all extremities well, without clubbing, cyanosis, or edema NEUROLOGIC:  Alert and oriented x 3, normal gait MENTAL STATUS:  appropriate BACK:  Non-tender to palpation, No CVAT SKIN:  Warm, dry, and intact   Results: Laboratory Data: Lab Results  Component Value Date   WBC 12.9 (H) 05/29/2021   HGB 14.2 05/29/2021   HCT 44.1 05/29/2021   MCV 86.6 05/29/2021   PLT 278 05/29/2021    Lab Results  Component Value Date   CREATININE 1.07 05/29/2021    Lab Results  Component Value Date   HGBA1C 5.6 03/04/2021    Urinalysis    Component Value Date/Time   COLORURINE YELLOW 02/20/2020 2218   APPEARANCEUR Clear 05/15/2021 1637   LABSPEC 1.023 02/20/2020 2218   PHURINE 5.0 02/20/2020 2218   GLUCOSEU Negative 05/15/2021 1637   HGBUR NEGATIVE 02/20/2020 2218   BILIRUBINUR Negative 05/15/2021 [place] 02/20/2020 2218   PROTEINUR Negative 05/15/2021 1637   PROTEINUR NEGATIVE 02/20/2020 2218   NITRITE Negative 05/15/2021 1637   NITRITE NEGATIVE 02/20/2020 2218   LEUKOCYTESUR Negative 05/15/2021 1637   LEUKOCYTESUR NEGATIVE 02/20/2020 2218    Lab Results  Component Value Date   LABMICR See below: 05/15/2021   WBCUA None seen 05/15/2021   LABEPIT None seen 05/15/2021   BACTERIA None seen 05/15/2021    Pertinent Imaging:  Results  for orders placed during the hospital encounter of 05/28/21  DG Abd 1 View  Narrative CLINICAL DATA:  Pre right lithotripsy  EXAM: ABDOMEN - 1 VIEW  COMPARISON:  Radiograph dated May 15, 2021  FINDINGS: Nonobstructive bowel gas pattern. Calcification of the right hemithorax measuring 10 mm projecting over the expected area of the lower pole of the right kidney and calcification of the left hemithorax measuring 5 mm projecting over the expected area of the lower pole of the left kidney, unchanged when compared with prior.  IMPRESSION: Unchanged bilateral renal stones.   Electronically Signed By: Yetta Glassman M.D. On: 05/28/2021 08:10  No results found for this or any previous visit.  No results found for this or any previous visit.  No results found for this or any previous visit.  No results found for this or any previous visit.  No results found for this or any previous visit.  No results found for this or any previous visit.  Results for orders placed in visit on 05/15/21  CT RENAL STONE STUDY  Narrative CLINICAL DATA:  Bilateral lower abdominal pain x1 week, history of kidney stones.  EXAM: CT ABDOMEN AND PELVIS WITHOUT CONTRAST  TECHNIQUE: Multidetector CT imaging of the abdomen and pelvis was performed following the standard protocol without IV contrast.  RADIATION DOSE REDUCTION: This exam was performed according to the departmental dose-optimization program which includes automated exposure control, adjustment of the mA and/or kV according to patient size and/or use of iterative reconstruction technique.  COMPARISON:  Same day abdominal radiograph.  FINDINGS: Lower chest: Coronary artery calcifications. Borderline cardiac enlargement. Bibasilar scarring versus atelectasis.  Hepatobiliary: No suspicious hepatic lesion on this noncontrast examination. Gallbladder is unremarkable. No biliary ductal dilation.  Pancreas: No pancreatic  ductal dilation or evidence of acute inflammation.  Spleen: No splenomegaly or focal splenic lesion.  Adrenals/Urinary Tract: Bilateral adrenal glands appear normal.  No hydronephrosis. Nonobstructive bilateral renal stones measuring up to 7 mm in the right lower pole and 4 mm in the left lower pole. No obstructive ureteral or bladder calculi identified.  Fluid density left lower pole renal lesion measures 2.8 x 2.1 x 4.9 cm is considered benign given the fluid attenuation of the lesion without evidence of complexity on noncontrast examination and most consistent with a single renal cyst or an adjacent renal sinus and cortical renal cysts.  Stomach/Bowel: No radiopaque enteric contrast material was administered. Stomach is unremarkable for degree of distension. No pathologic dilation of small or large bowel. The appendix and terminal ileum appear normal. No evidence of acute bowel inflammation.  Vascular/Lymphatic: Aortic and branch vessel atherosclerosis without abdominal aortic aneurysm. No pathologically enlarged abdominal or pelvic lymph nodes.  Reproductive: Prostate is unremarkable.  Other: No significant abdominopelvic free fluid.  Musculoskeletal: Multilevel degenerative changes spine. 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  IMPRESSION: 1. Nonobstructive bilateral renal stones measuring up to 7 mm in the right lower pole and 4 mm in the left lower pole. No obstructive ureteral or bladder calculi identified. 2. 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 3. Aortic Atherosclerosis (ICD10-I70.0).   Electronically Signed By: Dahlia Bailiff M.D. On: 05/15/2021 15:31  No results found for this or any previous visit (from the past 24 hour(s)).

## 2021-06-12 ENCOUNTER — Ambulatory Visit: Payer: Medicare HMO | Admitting: Physician Assistant

## 2021-06-12 ENCOUNTER — Ambulatory Visit (INDEPENDENT_AMBULATORY_CARE_PROVIDER_SITE_OTHER): Payer: Medicare HMO

## 2021-06-12 DIAGNOSIS — Z23 Encounter for immunization: Secondary | ICD-10-CM

## 2021-06-13 DIAGNOSIS — Z6831 Body mass index (BMI) 31.0-31.9, adult: Secondary | ICD-10-CM | POA: Diagnosis not present

## 2021-06-13 DIAGNOSIS — M47812 Spondylosis without myelopathy or radiculopathy, cervical region: Secondary | ICD-10-CM | POA: Diagnosis not present

## 2021-06-14 ENCOUNTER — Other Ambulatory Visit: Payer: Self-pay | Admitting: Neurosurgery

## 2021-06-14 ENCOUNTER — Other Ambulatory Visit (HOSPITAL_COMMUNITY): Payer: Self-pay | Admitting: Neurosurgery

## 2021-06-14 DIAGNOSIS — M47812 Spondylosis without myelopathy or radiculopathy, cervical region: Secondary | ICD-10-CM

## 2021-06-19 ENCOUNTER — Telehealth: Payer: Self-pay

## 2021-06-19 ENCOUNTER — Other Ambulatory Visit: Payer: Self-pay | Admitting: Family Medicine

## 2021-06-19 ENCOUNTER — Encounter: Payer: Self-pay | Admitting: Family Medicine

## 2021-06-19 DIAGNOSIS — R002 Palpitations: Secondary | ICD-10-CM

## 2021-06-19 LAB — CALCULI, WITH PHOTOGRAPH (CLINICAL LAB)
Calcium Oxalate Dihydrate: 20 %
Calcium Oxalate Monohydrate: 80 %
Weight Calculi: 194 mg

## 2021-06-19 NOTE — Telephone Encounter (Signed)
Letter sent informing results as well as diet information.

## 2021-06-19 NOTE — Telephone Encounter (Signed)
Sent letter

## 2021-06-19 NOTE — Telephone Encounter (Signed)
-----   Message from Reynaldo Minium, [place] sent at 06/19/2021  9:02 AM EDT ----- Please let the pt know his stone analysis indicates calcium oxylate stones and mail him the info/diet recommendation  ----- Message ----- From: Interface, [facility] Lab Results In Sent: 06/19/2021   5:38 AM EDT To: Reynaldo Minium, PA-C

## 2021-06-19 NOTE — Telephone Encounter (Signed)
Ronald Berger NTBS Last chronic ckup 06/14/20 mail order NOT sent

## 2021-06-26 ENCOUNTER — Other Ambulatory Visit: Payer: Medicare HMO

## 2021-07-01 ENCOUNTER — Other Ambulatory Visit: Payer: Medicare HMO

## 2021-07-01 ENCOUNTER — Other Ambulatory Visit: Payer: Self-pay | Admitting: Family Medicine

## 2021-07-01 DIAGNOSIS — S76212A Strain of adductor muscle, fascia and tendon of left thigh, initial encounter: Secondary | ICD-10-CM

## 2021-07-01 DIAGNOSIS — R972 Elevated prostate specific antigen [PSA]: Secondary | ICD-10-CM | POA: Diagnosis not present

## 2021-07-01 MED ORDER — METHOCARBAMOL 500 MG PO TABS: 500.0000 mg | ORAL_TABLET | Freq: Three times a day (TID) | ORAL | 0 refills | Status: DC | PRN

## 2021-07-02 LAB — PSA, TOTAL AND FREE
PSA, Free Pct: 20 %
PSA, Free: 1.2 ng/mL
Prostate Specific Ag, Serum: 6 ng/mL — ABNORMAL HIGH (ref 0.0–4.0)

## 2021-07-03 ENCOUNTER — Ambulatory Visit: Payer: Medicare HMO | Admitting: Urology

## 2021-07-03 ENCOUNTER — Other Ambulatory Visit: Payer: Self-pay | Admitting: Family Medicine

## 2021-07-03 DIAGNOSIS — F5101 Primary insomnia: Secondary | ICD-10-CM

## 2021-07-03 DIAGNOSIS — J301 Allergic rhinitis due to pollen: Secondary | ICD-10-CM

## 2021-07-03 DIAGNOSIS — J4530 Mild persistent asthma, uncomplicated: Secondary | ICD-10-CM

## 2021-07-08 ENCOUNTER — Encounter: Payer: Self-pay | Admitting: Urology

## 2021-07-08 ENCOUNTER — Other Ambulatory Visit: Payer: Self-pay | Admitting: Family Medicine

## 2021-07-08 ENCOUNTER — Ambulatory Visit: Payer: Medicare HMO | Admitting: Urology

## 2021-07-08 VITALS — BP 169/85 | HR 65

## 2021-07-08 DIAGNOSIS — N138 Other obstructive and reflux uropathy: Secondary | ICD-10-CM

## 2021-07-08 DIAGNOSIS — R351 Nocturia: Secondary | ICD-10-CM | POA: Diagnosis not present

## 2021-07-08 DIAGNOSIS — R972 Elevated prostate specific antigen [PSA]: Secondary | ICD-10-CM | POA: Diagnosis not present

## 2021-07-08 DIAGNOSIS — Z87442 Personal history of urinary calculi: Secondary | ICD-10-CM

## 2021-07-08 DIAGNOSIS — K219 Gastro-esophageal reflux disease without esophagitis: Secondary | ICD-10-CM

## 2021-07-08 DIAGNOSIS — N401 Enlarged prostate with lower urinary tract symptoms: Secondary | ICD-10-CM | POA: Diagnosis not present

## 2021-07-08 DIAGNOSIS — N200 Calculus of kidney: Secondary | ICD-10-CM

## 2021-07-08 LAB — URINALYSIS, ROUTINE W REFLEX MICROSCOPIC
Bilirubin, UA: NEGATIVE
Glucose, UA: NEGATIVE
Ketones, UA: NEGATIVE
Leukocytes,UA: NEGATIVE
Nitrite, UA: NEGATIVE
Protein,UA: NEGATIVE
RBC, UA: NEGATIVE
Specific Gravity, UA: 1.015 (ref 1.005–1.030)
Urobilinogen, Ur: 0.2 mg/dL (ref 0.2–1.0)
pH, UA: 6 (ref 5.0–7.5)

## 2021-07-08 MED ORDER — ALFUZOSIN HCL ER 10 MG PO TB24: 10.0000 mg | ORAL_TABLET | Freq: Every day | ORAL | 11 refills | Status: DC

## 2021-07-08 NOTE — Progress Notes (Incomplete)
07/08/2021 4:15 PM   Ronald Berger 01-03-1946 [id number]  Referring provider: Loman [place], [place],  [place] [address]     HPI:  No flank pain. PSA decreased to 6.0 from 6.6. IPSS 17 QOl 2 on uroxatral '10mg'$ . Nocturia 3-4x. 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 NSTEMI 07/2013 - DES to mLCx, DES to prox OM1, DES to mLAD with nonobstructive RCA stenosis, EF 55-65%.   Chronic kidney disease    kidney stones    Dyslipidemia    Family history of bone cancer    GERD (gastroesophageal reflux disease)    History of kidney stones    History of non-ST elevation myocardial infarction (NSTEMI)    07/ 2015  S/P  DES X3   History of palpitations    Hyperlipidemia    Hypertension    Insomnia 12/08/2018   x many years   Myocardial infarction ([place]) 2015   NSTEMI- 3 stents    OSA (obstructive sleep apnea)    cpap intolerant   Pneumonia due to COVID-19 virus    Right knee meniscal tear    S/P drug eluting coronary stent placement    07/ 2015  x3  to mLCFX, OM1, mLAD   Sinus bradycardia    Sleep apnea    no cpap     Surgical History: Past Surgical History:  Procedure Laterality Date   CATARACT EXTRACTION, BILATERAL     COLONOSCOPY     >10 yrs ago-polyps per pt- we have no report    EXTRACORPOREAL SHOCK WAVE LITHOTRIPSY  x2 last one 2005   EXTRACORPOREAL SHOCK WAVE LITHOTRIPSY Right 05/28/2021   Procedure: EXTRACORPOREAL SHOCK WAVE LITHOTRIPSY (ESWL);  Surgeon: Primus Bravo., MD;  Location: [facility];  Service: Urology;  Laterality: Right;   KNEE ARTHROSCOPY Right 05/02/2015   Procedure: RIGHT KNEE ARTHROSCOPY WITH medial meniscal DEBRIDEMENT ;  Surgeon:  Gaynelle Arabian, MD;  Location: [facility];  Service: Orthopedics;  Laterality: Right;   LEFT HEART CATHETERIZATION WITH CORONARY ANGIOGRAM N/A 08/05/2013   Procedure: LEFT HEART CATHETERIZATION WITH CORONARY ANGIOGRAM;  Surgeon: Blane Ohara, MD;  Location: [facility] CATH LAB;  Service: Cardiovascular;  Laterality: N/A;  Promus DES's to  mLCFx,  OM1,  mLAD (total 3)/   mRCA 30%,  dLM  20-30%,  preserved LVSF, ef 55-65%   LITHOTRIPSY     LUMBAR SPINE SURGERY  x3  last one 1980's   SHOULDER ARTHROSCOPY Right 2013   TONSILLECTOMY      Home Medications:  Allergies as of 07/08/2021       Reactions   Bee Venom Shortness Of Breath   Oxycodone Nausea And Vomiting   Percocet [oxycodone-acetaminophen] Nausea And Vomiting        Medication List        Accurate as of July 08, 2021  4:15 PM. If you have any questions, ask your nurse or doctor.          albuterol 108 (90 Base) MCG/ACT inhaler Commonly known as: VENTOLIN HFA INHALE 2 PUFFS INTO THE LUNGS EVERY 6  HOURS AS NEEDED FOR WHEEZING OR  SHORTNESS OF BREATH.   alfuzosin 10 MG 24 hr tablet Commonly known as: UROXATRAL Take 1 tablet (10 mg total) by mouth daily with breakfast. Please stop Silodosin '8mg'$  and start taking Alfuzosin   aspirin 81 MG chewable tablet Chew 1 tablet (81 mg total) by mouth daily.   atorvastatin 40 MG tablet Commonly known as: LIPITOR Take 1 tablet (40 mg total) by mouth daily.   cyclobenzaprine 5 MG tablet Commonly known as: FLEXERIL TAKE 1 TABLET BY MOUTH THREE TIMES DAILY AS NEEDED FOR MUSCLE SPASMS   diclofenac 75 MG EC tablet Commonly known as: VOLTAREN Take 1 tablet (75 mg total) by mouth 2 (two) times daily.   Emergen-C Blue Pack Take 1 each by mouth daily.   EPINEPHrine 0.3 mg/0.3 mL Soaj injection Commonly known as: EPI-PEN INJECT 0.3 MG INTO THE MUSCLE AS NEEDED FOR ANAPHYLAXIS.   fluticasone 50 MCG/ACT nasal spray Commonly known as: FLONASE USE 2 SPRAYS IN EACH NOSTRIL  EVERY DAY   fluticasone-salmeterol 250-50 MCG/ACT Aepb Commonly known as: ADVAIR Inhale 1 puff into the lungs in the morning and at bedtime.   HYDROcodone-acetaminophen 10-325 MG tablet Commonly known as: Norco Take 1 tablet by mouth every 6 (six) hours as needed.   levocetirizine 5 MG tablet Commonly known as: XYZAL TAKE 1 TABLET EVERY EVENING   Melatonin 10 MG Tabs Take 10 mg by mouth at bedtime.   methocarbamol 500 MG tablet Commonly known as: ROBAXIN Take 1 tablet (500 mg total) by mouth every 8 (eight) hours as needed for muscle spasms.   metoprolol tartrate 25 MG tablet Commonly known as: LOPRESSOR Take 0.5 tablets (12.5 mg total) by mouth 2 (two) times daily.   montelukast 10 MG tablet Commonly known as: SINGULAIR TAKE 1 TABLET AT BEDTIME   nitroGLYCERIN 0.4 MG SL tablet Commonly known as: NITROSTAT Place 1 tablet (0.4 mg total) under the tongue every 5 (five) minutes as needed for chest pain.   omeprazole 20 MG capsule Commonly known as: PRILOSEC Take 1 capsule (20 mg total) by mouth every evening.   traZODone 50 MG tablet Commonly known as: DESYREL TAKE 1 TABLET (50 MG TOTAL) BY MOUTH AT BEDTIME        Allergies:  Allergies  Allergen Reactions   Bee Venom Shortness Of Breath   Oxycodone Nausea And Vomiting   Percocet [Oxycodone-Acetaminophen] Nausea And Vomiting    Family History: Family History  Problem Relation Age of Onset   Coronary artery disease Mother        CABG   CAD Sister    Bone cancer Sister    Alzheimer's disease Father    Aneurysm Brother        brain   Heart failure Sister    Bone cancer Sister    Colon cancer Neg Hx    Colon polyps Neg Hx    Esophageal cancer Neg Hx    Rectal cancer Neg Hx    Stomach cancer Neg Hx     Social History:  reports that he quit smoking about 55 years ago. His smoking use included cigarettes. He has never used smokeless tobacco. He reports current alcohol use. He reports that he does not use  drugs.  ROS: All other review of systems were reviewed and are negative except what is noted above in HPI  Physical Exam: BP (!) 169/85   Pulse 65   Constitutional:  Alert and oriented, No acute distress. HEENT: [place] AT, moist mucus membranes.  Trachea midline, no masses. Cardiovascular:  No clubbing, cyanosis, or edema. Respiratory: Normal respiratory effort, no increased work of breathing. GI: Abdomen is soft, nontender, nondistended, no abdominal masses GU: No CVA tenderness.  Lymph: No cervical or inguinal lymphadenopathy. Skin: No rashes, bruises or suspicious lesions. Neurologic: Grossly intact, no focal deficits, moving all 4 extremities. Psychiatric: Normal mood and affect.  Laboratory Data: Lab Results  Component Value Date   WBC 12.9 (H) 05/29/2021   HGB 14.2 05/29/2021   HCT 44.1 05/29/2021   MCV 86.6 05/29/2021   PLT 278 05/29/2021    Lab Results  Component Value Date   CREATININE 1.07 05/29/2021    No results found for: "PSA"  No results found for: "TESTOSTERONE"  Lab Results  Component Value Date   HGBA1C 5.6 03/04/2021    Urinalysis    Component Value Date/Time   COLORURINE YELLOW 02/20/2020 2218   APPEARANCEUR Clear 07/08/2021 1520   LABSPEC 1.023 02/20/2020 2218   PHURINE 5.0 02/20/2020 2218   GLUCOSEU Negative 07/08/2021 1520   HGBUR NEGATIVE 02/20/2020 2218   BILIRUBINUR Negative 07/08/2021 [place] 02/20/2020 2218   PROTEINUR Negative 07/08/2021 [place] 02/20/2020 2218   NITRITE Negative 07/08/2021 1520   NITRITE NEGATIVE 02/20/2020 2218   LEUKOCYTESUR Negative 07/08/2021 [place] 02/20/2020 2218    Lab Results  Component Value Date   LABMICR Comment 07/08/2021   WBCUA None seen 05/15/2021   LABEPIT None seen 05/15/2021   BACTERIA None seen 05/15/2021    Pertinent Imaging: *** Results for orders placed in visit on 06/11/21  DG Abd 1 View  Narrative CLINICAL DATA:   Asymptomatic. Follow-up, status post lithotripsy on May 28, 2021.  EXAM: ABDOMEN - 1 VIEW  COMPARISON:  May 28, 2021  FINDINGS: The bowel gas pattern is normal. A stable 10 mm soft tissue calcification is seen projecting over the lower pole of the right kidney. An additional stable 5 mm soft tissue calcification is seen projecting over the left kidney.  IMPRESSION: Stable bilateral renal calculi.   Electronically Signed By: Virgina Norfolk M.D. On: 06/12/2021 20:32  No results found for this or any previous visit.  No results found for this or any previous visit.  No results found for this or any previous visit.  No results found for this or any previous visit.  No results found for this or any previous visit.  No results found for this or any previous visit.  Results for orders placed in visit on 05/15/21  CT RENAL STONE STUDY  Narrative CLINICAL DATA:  Bilateral lower abdominal pain x1 week, history of kidney stones.  EXAM: CT ABDOMEN AND PELVIS WITHOUT CONTRAST  TECHNIQUE: Multidetector CT imaging of the abdomen and pelvis was performed following the standard protocol without IV contrast.  RADIATION DOSE REDUCTION: This exam was performed according to the departmental dose-optimization program which includes automated exposure control, adjustment of the mA and/or kV according to patient size and/or use of iterative reconstruction technique.  COMPARISON:  Same day abdominal radiograph.  FINDINGS: Lower chest: Coronary artery calcifications. Borderline cardiac enlargement. Bibasilar scarring versus atelectasis.  Hepatobiliary: No suspicious hepatic lesion on this noncontrast examination. Gallbladder is unremarkable. No biliary ductal dilation.  Pancreas: No pancreatic ductal dilation or evidence of acute inflammation.  Spleen: No splenomegaly or focal splenic lesion.  Adrenals/Urinary Tract: Bilateral adrenal glands appear normal.  No  hydronephrosis. Nonobstructive bilateral renal stones measuring up to 7 mm in the right lower pole and 4 mm in  the left lower pole. No obstructive ureteral or bladder calculi identified.  Fluid density left lower pole renal lesion measures 2.8 x 2.1 x 4.9 cm is considered benign given the fluid attenuation of the lesion without evidence of complexity on noncontrast examination and most consistent with a single renal cyst or an adjacent renal sinus and cortical renal cysts.  Stomach/Bowel: No radiopaque enteric contrast material was administered. Stomach is unremarkable for degree of distension. No pathologic dilation of small or large bowel. The appendix and terminal ileum appear normal. No evidence of acute bowel inflammation.  Vascular/Lymphatic: Aortic and branch vessel atherosclerosis without abdominal aortic aneurysm. No pathologically enlarged abdominal or pelvic lymph nodes.  Reproductive: Prostate is unremarkable.  Other: No significant abdominopelvic free fluid.  Musculoskeletal: Multilevel degenerative changes spine. 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  IMPRESSION: 1. Nonobstructive bilateral renal stones measuring up to 7 mm in the right lower pole and 4 mm in the left lower pole. No obstructive ureteral or bladder calculi identified. 2. 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 3. Aortic Atherosclerosis (ICD10-I70.0).   Electronically Signed By: Dahlia Bailiff M.D. On: 05/15/2021 15:31   Assessment & Plan:    1. Kidney stones ***  2. Benign prostatic hyperplasia with urinary obstruction *** - Urinalysis, Routine w reflex microscopic  3. Elevated PSA *** - Urinalysis, Routine w reflex microscopic  4. Nocturia ***   No follow-ups on file.  Nicolette Bang, MD  [facility] [place]

## 2021-07-08 NOTE — Patient Instructions (Signed)
Dietary Guidelines to Help Prevent Kidney Stones Kidney stones are deposits of minerals and salts that form inside your kidneys. Your risk of developing kidney stones may be greater depending on your diet, your lifestyle, the medicines you take, and whether you have certain medical conditions. Most people can lower their chances of developing kidney stones by following the instructions below. Your dietitian may give you more specific instructions depending on your overall health and the type of kidney stones you tend to develop. What are tips for following this plan? Reading food labels  Choose foods with "no salt added" or "low-salt" labels. Limit your salt (sodium) intake to less than 1,500 mg a day. Choose foods with calcium for each meal and snack. Try to eat about 300 mg of calcium at each meal. Foods that contain 200-500 mg of calcium a serving include: 8 oz (237 mL) of milk, calcium-fortifiednon-dairy milk, and calcium-fortifiedfruit juice. Calcium-fortified means that calcium has been added to these drinks. 8 oz (237 mL) of kefir, yogurt, and soy yogurt. 4 oz (114 g) of tofu. 1 oz (28 g) of cheese. 1 cup (150 g) of dried figs. 1 cup (91 g) of cooked broccoli. One 3 oz (85 g) can of sardines or mackerel. Most people need 1,000-1,500 mg of calcium a day. Talk to your dietitian about how much calcium is recommended for you. Shopping Buy plenty of fresh fruits and vegetables. Most people do not need to avoid fruits and vegetables, even if these foods contain nutrients that may contribute to kidney stones. When shopping for convenience foods, choose: Whole pieces of fruit. Pre-made salads with dressing on the side. Low-fat fruit and yogurt smoothies. Avoid buying frozen meals or prepared deli foods. These can be high in sodium. Look for foods with live cultures, such as yogurt and kefir. Choose high-fiber grains, such as whole-wheat breads, oat bran, and wheat cereals. Cooking Do not add  salt to food when cooking. Place a salt shaker on the table and allow each person to add his or her own salt to taste. Use vegetable protein, such as beans, textured vegetable protein (TVP), or tofu, instead of meat in pasta, casseroles, and soups. Meal planning Eat less salt, if told by your dietitian. To do this: Avoid eating processed or pre-made food. Avoid eating fast food. Eat less animal protein, including cheese, meat, poultry, or fish, if told by your dietitian. To do this: Limit the number of times you have meat, poultry, fish, or cheese each week. Eat a diet free of meat at least 2 days a week. Eat only one serving each day of meat, poultry, fish, or seafood. When you prepare animal protein, cut pieces into small portion sizes. For most meat and fish, one serving is about the size of the palm of your hand. Eat at least five servings of fresh fruits and vegetables each day. To do this: Keep fruits and vegetables on hand for snacks. Eat one piece of fruit or a handful of berries with breakfast. Have a salad and fruit at lunch. Have two kinds of vegetables at dinner. Limit foods that are high in a substance called oxalate. These include: Spinach (cooked), rhubarb, beets, sweet potatoes, and Swiss chard. Peanuts. Potato chips, french fries, and baked potatoes with skin on. Nuts and nut products. Chocolate. If you regularly take a diuretic medicine, make sure to eat at least 1 or 2 servings of fruits or vegetables that are high in potassium each day. These include: Avocado. Banana. Orange, prune,   carrot, or tomato juice. Baked potato. Cabbage. Beans and split peas. Lifestyle  Drink enough fluid to keep your urine pale yellow. This is the most important thing you can do. Spread your fluid intake throughout the day. If you drink alcohol: Limit how much you use to: 0-1 drink a day for women who are not pregnant. 0-2 drinks a day for men. Be aware of how much alcohol is in your  drink. In the [place] one drink equals one 12 oz bottle of beer (355 mL), one 5 oz glass of wine (148 mL), or one 1 oz glass of hard liquor (44 mL). Lose weight if told by your health care provider. Work with your dietitian to find an eating plan and weight loss strategies that work best for you. General information Talk to your health care provider and dietitian about taking daily supplements. You may be told the following depending on your health and the cause of your kidney stones: Not to take supplements with vitamin C. To take a calcium supplement. To take a daily probiotic supplement. To take other supplements such as magnesium, fish oil, or vitamin B6. Take over-the-counter and prescription medicines only as told by your health care provider. These include supplements. What foods should I limit? Limit your intake of the following foods, or eat them as told by your dietitian. Vegetables Spinach. Rhubarb. Beets. Canned vegetables. Pickles. Olives. Baked potatoes with skin. Grains Wheat bran. Baked goods. Salted crackers. Cereals high in sugar. Meats and other proteins Nuts. Nut butters. Large portions of meat, poultry, or fish. Salted, precooked, or cured meats, such as sausages, meat loaves, and hot dogs. Dairy Cheese. Beverages Regular soft drinks. Regular vegetable juice. Seasonings and condiments Seasoning blends with salt. Salad dressings. Soy sauce. Ketchup. Barbecue sauce. Other foods Canned soups. Canned pasta sauce. Casseroles. Pizza. Lasagna. Frozen meals. Potato chips. French fries. The items listed above may not be a complete list of foods and beverages you should limit. Contact a dietitian for more information. What foods should I avoid? Talk to your dietitian about specific foods you should avoid based on the type of kidney stones you have and your overall health. Fruits Grapefruit. The item listed above may not be a complete list of foods and beverages you should  avoid. Contact a dietitian for more information. Summary Kidney stones are deposits of minerals and salts that form inside your kidneys. You can lower your risk of kidney stones by making changes to your diet. The most important thing you can do is drink enough fluid. Drink enough fluid to keep your urine pale yellow. Talk to your dietitian about how much calcium you should have each day, and eat less salt and animal protein as told by your dietitian. This information is not intended to replace advice given to you by your health care provider. Make sure you discuss any questions you have with your health care provider. Document Revised: 09/17/2020 Document Reviewed: 09/17/2020 Elsevier Patient Education  2023 Elsevier Inc.  

## 2021-07-09 NOTE — Telephone Encounter (Signed)
Appt made

## 2021-07-09 NOTE — Telephone Encounter (Signed)
Ronald Berger NTBS in July for 6 mos ckup. Mail order NOT sent

## 2021-07-13 ENCOUNTER — Other Ambulatory Visit: Payer: Self-pay | Admitting: Family Medicine

## 2021-07-13 DIAGNOSIS — U071 COVID-19: Secondary | ICD-10-CM

## 2021-07-17 ENCOUNTER — Encounter: Payer: Self-pay | Admitting: Family Medicine

## 2021-07-17 ENCOUNTER — Ambulatory Visit (INDEPENDENT_AMBULATORY_CARE_PROVIDER_SITE_OTHER): Payer: Medicare HMO | Admitting: Family Medicine

## 2021-07-17 VITALS — BP 148/86 | HR 76 | Temp 98.0°F | Ht 69.0 in | Wt 228.4 lb

## 2021-07-17 DIAGNOSIS — I252 Old myocardial infarction: Secondary | ICD-10-CM

## 2021-07-17 DIAGNOSIS — G4733 Obstructive sleep apnea (adult) (pediatric): Secondary | ICD-10-CM | POA: Diagnosis not present

## 2021-07-17 DIAGNOSIS — K219 Gastro-esophageal reflux disease without esophagitis: Secondary | ICD-10-CM

## 2021-07-17 DIAGNOSIS — I2510 Atherosclerotic heart disease of native coronary artery without angina pectoris: Secondary | ICD-10-CM

## 2021-07-17 DIAGNOSIS — E785 Hyperlipidemia, unspecified: Secondary | ICD-10-CM | POA: Diagnosis not present

## 2021-07-17 DIAGNOSIS — R7303 Prediabetes: Secondary | ICD-10-CM

## 2021-07-17 DIAGNOSIS — J4541 Moderate persistent asthma with (acute) exacerbation: Secondary | ICD-10-CM

## 2021-07-17 DIAGNOSIS — Z1211 Encounter for screening for malignant neoplasm of colon: Secondary | ICD-10-CM

## 2021-07-17 DIAGNOSIS — L989 Disorder of the skin and subcutaneous tissue, unspecified: Secondary | ICD-10-CM

## 2021-07-17 DIAGNOSIS — R002 Palpitations: Secondary | ICD-10-CM

## 2021-07-17 DIAGNOSIS — J301 Allergic rhinitis due to pollen: Secondary | ICD-10-CM | POA: Diagnosis not present

## 2021-07-17 DIAGNOSIS — F5101 Primary insomnia: Secondary | ICD-10-CM

## 2021-07-17 DIAGNOSIS — M542 Cervicalgia: Secondary | ICD-10-CM

## 2021-07-17 MED ORDER — ATORVASTATIN CALCIUM 40 MG PO TABS: 40.0000 mg | ORAL_TABLET | Freq: Every day | ORAL | 1 refills | Status: DC

## 2021-07-17 MED ORDER — TRAZODONE HCL 50 MG PO TABS: 50.0000 mg | ORAL_TABLET | Freq: Every day | ORAL | 1 refills | Status: DC

## 2021-07-17 MED ORDER — METOPROLOL TARTRATE 25 MG PO TABS: 12.5000 mg | ORAL_TABLET | Freq: Two times a day (BID) | ORAL | 1 refills | Status: DC

## 2021-07-17 MED ORDER — METHOCARBAMOL 500 MG PO TABS: 500.0000 mg | ORAL_TABLET | Freq: Three times a day (TID) | ORAL | 1 refills | Status: DC | PRN

## 2021-07-17 NOTE — Progress Notes (Signed)
Assessment & Plan:   Problem List Items Addressed This Visit       Cardiovascular and Mediastinum   CAD in native artery    Continue atorvastatin and aspirin.      Relevant Medications   metoprolol tartrate (LOPRESSOR) 25 MG tablet   atorvastatin (LIPITOR) 40 MG tablet     Respiratory   OSA (obstructive sleep apnea)    Patient does Ronald wear his CPAP as he reports he is unable to tolerate it.      Seasonal allergic rhinitis due to pollen    Well controlled on current regimen of Xyzal and Flonase.      Moderate persistent asthma with exacerbation    Well controlled on current regimen.         Digestive   Esophageal reflux    Well controlled on current regimen.         Other   History of non-ST elevation myocardial infarction (NSTEMI)    Continue atorvastatin and aspirin.      Relevant Medications   atorvastatin (LIPITOR) 40 MG tablet   Hyperlipidemia LDL goal <70    Well controlled on current regimen.       Relevant Medications   metoprolol tartrate (LOPRESSOR) 25 MG tablet   atorvastatin (LIPITOR) 40 MG tablet   Palpitations    Well controlled on current regimen.       Relevant Medications   metoprolol tartrate (LOPRESSOR) 25 MG tablet   Insomnia - Primary    Well controlled on current regimen of Trazodone and melatonin.      Relevant Medications   traZODone (DESYREL) 50 MG tablet   Prediabetes    Diet controlled.      Other Visit Diagnoses     Muscle pain, cervical       Relevant Medications   methocarbamol (ROBAXIN) 500 MG tablet   Skin lesion of scalp       Relevant Orders   Ambulatory referral to Dermatology   Colon cancer screening       Relevant Orders   Ambulatory referral to Gastroenterology       Return in about 3 months (around 10/17/2021) for follow-up of chronic medication conditions.  Hendricks Limes, Ronald Berger, Ronald Berger, Ronald [facility]  Subjective:    Patient ID: JADD GASIOR, male    DOB:  06/04/1945, 76 y.o.   MRN: [id number]  Patient Care Team: Loman Brooklyn, Ronald as PCP - General (Family Medicine) Debara Pickett Nadean Corwin, Ronald as PCP - Cardiology (Cardiology) Alyson Ingles Berger [place], Ronald as Consulting Physician (Urology) Harlen Labs, Ronald as Referring Physician (Optometry) Ernst Bowler Gwenith Daily, Ronald as Consulting Physician (Allergy and Immunology)   Chief Complaint:  Chief Complaint  Patient presents with   Medical Management of Chronic Issues    HPI: MCCABE GLORIA is a 76 y.o. male presenting on 07/17/2021 for Medical Management of Chronic Issues  Patient has sleep apnea but is unable to tolerate a CPAP.   Patient has seasonal allergies for which he takes Xyzal 5 mg once daily and Flonase.   Insomnia: controlled with Trazodone and melatonin. Previously failed therapy with temazepam and the lower dosage of Ambien. Ambien 10 mg was decreased previously due to his age.   Asthma: uses Advair twice daily, Singulair at bedtime, and Albuterol as needed which is Ronald daily but may be more than once daily if he is doing something like mowing. He does report his asthma has been better since being on  Advair.     Prediabetes: last A1c 5.6 in February 2023.   GERD: controlled with omeprazole.   CAD: taking atorvastatin and aspirin.  Hypertension: he does monitor his blood pressure at home and gets good readings, 127/70 this morning.  New complaints: None   Social history:  Relevant past medical, surgical, family and social history reviewed and updated as indicated. Interim medical history since our last visit reviewed.  Allergies and medications reviewed and updated.  DATA REVIEWED: CHART IN EPIC  ROS: Negative unless specifically indicated above in HPI.    Current Outpatient Medications:    albuterol (VENTOLIN HFA) 108 (90 Base) MCG/ACT inhaler, INHALE 2 PUFFS INTO THE LUNGS EVERY 6  HOURS AS NEEDED FOR WHEEZING OR SHORTNESS OF BREATH., Disp: 3 each, Rfl: 1   alfuzosin  (UROXATRAL) 10 MG 24 hr tablet, Take 1 tablet (10 mg total) by mouth daily with breakfast. Please stop Silodosin 46m and start taking Alfuzosin, Disp: 30 tablet, Rfl: 11   aspirin 81 MG chewable tablet, Chew 1 tablet (81 mg total) by mouth daily., Disp: , Rfl:    atorvastatin (LIPITOR) 40 MG tablet, Take 1 tablet (40 mg total) by mouth daily., Disp: 90 tablet, Rfl: 1   diclofenac (VOLTAREN) 75 MG EC tablet, Take 1 tablet (75 mg total) by mouth 2 (two) times daily., Disp: 60 tablet, Rfl: 1   EPINEPHRINE 0.3 mg/0.3 mL IJ SOAJ injection, INJECT 0.3 MG INTO THE MUSCLE AS NEEDED FOR ANAPHYLAXIS., Disp: 2 each, Rfl: 2   fluticasone (FLONASE) 50 MCG/ACT nasal spray, USE 2 SPRAYS IN EACH NOSTRIL EVERY DAY, Disp: 48 g, Rfl: 1   fluticasone-salmeterol (ADVAIR) 250-50 MCG/ACT AEPB, Inhale 1 puff into the lungs in the morning and at bedtime., Disp: 60 each, Rfl: 5   levocetirizine (XYZAL) 5 MG tablet, TAKE 1 TABLET EVERY EVENING, Disp: 90 tablet, Rfl: 0   methocarbamol (ROBAXIN) 500 MG tablet, Take 1 tablet (500 mg total) by mouth every 8 (eight) hours as needed for muscle spasms., Disp: 30 tablet, Rfl: 0   metoprolol tartrate (LOPRESSOR) 25 MG tablet, Take 0.5 tablets (12.5 mg total) by mouth 2 (two) times daily., Disp: 90 tablet, Rfl: 3   montelukast (SINGULAIR) 10 MG tablet, TAKE 1 TABLET AT BEDTIME, Disp: 90 tablet, Rfl: 0   Multiple Vitamins-Minerals (EMERGEN-C BLUE) PACK, Take 1 each by mouth daily., Disp: , Rfl:    nitroGLYCERIN (NITROSTAT) 0.4 MG SL tablet, Place 1 tablet (0.4 mg total) under the tongue every 5 (five) minutes as needed for chest pain., Disp: 25 tablet, Rfl: 2   omeprazole (PRILOSEC) 20 MG capsule, Take 1 capsule (20 mg total) by mouth every evening., Disp: 90 capsule, Rfl: 3   traZODone (DESYREL) 50 MG tablet, TAKE 1 TABLET (50 MG TOTAL) BY MOUTH AT BEDTIME, Disp: 90 tablet, Rfl: 1   cyclobenzaprine (FLEXERIL) 5 MG tablet, TAKE 1 TABLET BY MOUTH THREE TIMES DAILY AS NEEDED FOR MUSCLE  SPASMS (Patient Ronald taking: Reported on 07/17/2021), Disp: 30 tablet, Rfl: 1   HYDROcodone-acetaminophen (NORCO) 10-325 MG tablet, Take 1 tablet by mouth every 6 (six) hours as needed. (Patient Ronald taking: Reported on 07/17/2021), Disp: 15 tablet, Rfl: 0   Melatonin 10 MG TABS, Take 10 mg by mouth at bedtime. 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 NSTEMI 07/2013 - DES to mLCx, DES to prox OM1, DES to mLAD with nonobstructive RCA stenosis, EF 55-65%.   Chronic kidney disease    kidney stones    Dyslipidemia    Family history of bone cancer    GERD (gastroesophageal reflux disease)    History of kidney stones    History of non-ST elevation myocardial infarction (NSTEMI)    07/ 2015  S/P  DES X3   History of palpitations    Hyperlipidemia    Hypertension    Insomnia 12/08/2018   x many years   Myocardial infarction ([place]) 2015   NSTEMI- 3 stents    OSA (obstructive sleep apnea)    cpap intolerant   Pneumonia due to COVID-19 virus    Right knee meniscal tear    S/P drug eluting coronary stent placement    07/ 2015  x3  to mLCFX, OM1, mLAD   Sinus bradycardia    Sleep apnea    no cpap     Past Surgical History:  Procedure Laterality Date   CATARACT EXTRACTION, BILATERAL     COLONOSCOPY     >10 yrs ago-polyps per pt- we have no report    EXTRACORPOREAL SHOCK WAVE LITHOTRIPSY  x2 last one 2005   EXTRACORPOREAL SHOCK WAVE LITHOTRIPSY Right 05/28/2021   Procedure: EXTRACORPOREAL SHOCK WAVE LITHOTRIPSY (ESWL);  Surgeon:  Primus Bravo., Ronald;  Location: [facility];  Service: Urology;  Laterality: Right;   KNEE ARTHROSCOPY Right 05/02/2015   Procedure: RIGHT KNEE ARTHROSCOPY WITH medial meniscal DEBRIDEMENT ;  Surgeon: Gaynelle Arabian, Ronald;  Location: [facility];  Service: Orthopedics;  Laterality: Right;   LEFT HEART CATHETERIZATION WITH CORONARY ANGIOGRAM N/A 08/05/2013   Procedure: LEFT HEART CATHETERIZATION WITH CORONARY ANGIOGRAM;  Surgeon: Blane Ohara, Ronald;  Location: [facility] C[facility];  Service: Cardiovascular;  Laterality: N/A;  Promus DES's to  mLCFx,  OM1,  mLAD (total 3)/   mRCA 30%,  dLM  20-30%,  preserved LVSF, ef 55-65%   LITHOTRIPSY     LUMBAR SPINE SURGERY  x3  last one 1980's   SHOULDER ARTHROSCOPY Right 2013   TONSILLECTOMY      Social History   Socioeconomic History   Marital status: Married    Spouse name: Joelene Millin   Number of children: 1   Years of education: 12   Highest education level: Some college, no degree  Occupational History   Occupation: [employer]    Comment: Retired - prior Oceanographer  Tobacco Use   Smoking status: Former    Years: 1.00    Types: Cigarettes    Quit date: 04/27/1966    Years since quitting: 55.2   Smokeless tobacco: Never  Vaping Use   Vaping Use: Never used  Substance and Sexual Activity   Alcohol use: Yes    Comment: occasional   Drug use: No   Sexual activity: Yes  Other Topics Concern   Ronald on file  Social History Narrative   Son lives with them   One level home   Social Determinants of Health   Financial Resource Strain: Low Risk  (04/03/2021)   Overall Financial Resource Strain (CARDIA)  Difficulty of Paying Living Expenses: Ronald hard at all  Food Insecurity: No Food Insecurity (04/03/2021)   Hunger Vital Sign    Worried About Running Out of Food in the Last Year: Never true    Ran Out of Food in the Last Year: Never true  Transportation Needs: No Transportation Needs (04/03/2021)   PRAPARE -  Hydrologist (Medical): No    Lack of Transportation (Non-Medical): No  Physical Activity: Sufficiently Active (04/03/2021)   Exercise Vital Sign    Days of Exercise per Week: 5 days    Minutes of Exercise per Session: 60 min  Stress: No Stress Concern Present (04/03/2021)   [place]    Feeling of Stress : Ronald at all  Social Connections: Moderately Isolated (04/03/2021)   Social Connection and Isolation Panel [NHANES]    Frequency of Communication with Friends and Family: Once a week    Frequency of Social Gatherings with Friends and Family: Once a week    Attends Religious Services: Never    Marine scientist or Organizations: Yes    Attends Music therapist: More than 4 times per year    Marital Status: Married  Human resources officer Violence: Ronald At Risk (04/03/2021)   Humiliation, Afraid, Rape, and Kick questionnaire    Fear of Current or Ex-Partner: No    Emotionally Abused: No    Physically Abused: No    Sexually Abused: No        Objective:    BP (!) 148/86   Pulse 76   Temp 98 F (36.7 C) (Temporal)   Ht '5\' 9"'  (1.753 m)   Wt 228 lb 6.4 oz (103.6 kg)   SpO2 95%   BMI 33.73 kg/m   Wt Readings from Last 3 Encounters:  07/17/21 228 lb 6.4 oz (103.6 kg)  06/11/21 213 lb (96.6 kg)  05/30/21 216 lb 4 oz (98.1 kg)    Physical Exam Vitals reviewed.  Constitutional:      General: He is Ronald in acute distress.    Appearance: Normal appearance. He is obese. He is Ronald ill-appearing, toxic-appearing or diaphoretic.  HENT:     Head: Normocephalic and atraumatic.  Eyes:     General: No scleral icterus.       Right eye: No discharge.        Left eye: No discharge.     Conjunctiva/sclera: Conjunctivae normal.  Cardiovascular:     Rate and Rhythm: Normal rate and regular rhythm.     Heart sounds: Normal heart sounds. No murmur heard.    No friction rub. No gallop.   Pulmonary:     Effort: Pulmonary effort is normal. No respiratory distress.     Breath sounds: Normal breath sounds. No stridor. No wheezing, rhonchi or rales.  Musculoskeletal:        General: Normal range of motion.     Cervical back: Normal range of motion.  Skin:    General: Skin is warm and dry.     Findings: Abrasion (forehead) and lesion (scalp) present.  Neurological:     Mental Status: He is alert and oriented to person, place, and time. Mental status is at baseline.  Psychiatric:        Mood and Affect: Mood normal.        Behavior: Behavior normal.        Thought Content: Thought content normal.  Judgment: Judgment normal.     Lab Results  Component Value Date   TSH 3.670 06/14/2020   Lab Results  Component Value Date   WBC 12.9 (H) 05/29/2021   HGB 14.2 05/29/2021   HCT 44.1 05/29/2021   MCV 86.6 05/29/2021   PLT 278 05/29/2021   Lab Results  Component Value Date   NA 135 05/29/2021   K 4.4 05/29/2021   CO2 22 05/29/2021   GLUCOSE 103 (H) 05/29/2021   BUN 15 05/29/2021   CREATININE 1.07 05/29/2021   BILITOT 0.4 03/04/2021   ALKPHOS 114 03/04/2021   AST 21 03/04/2021   ALT 28 03/04/2021   PROT 6.5 03/04/2021   ALBUMIN 4.1 03/04/2021   CALCIUM 9.8 05/29/2021   ANIONGAP 14 05/29/2021   EGFR 80 03/04/2021   Lab Results  Component Value Date   CHOL 131 03/04/2021   Lab Results  Component Value Date   HDL 50 03/04/2021   Lab Results  Component Value Date   LDLCALC 57 03/04/2021   Lab Results  Component Value Date   TRIG 142 03/04/2021   Lab Results  Component Value Date   CHOLHDL 2.6 03/04/2021   Lab Results  Component Value Date   HGBA1C 5.6 03/04/2021

## 2021-07-22 ENCOUNTER — Other Ambulatory Visit: Payer: Self-pay | Admitting: Family Medicine

## 2021-07-22 DIAGNOSIS — M25512 Pain in left shoulder: Secondary | ICD-10-CM

## 2021-07-22 DIAGNOSIS — M542 Cervicalgia: Secondary | ICD-10-CM

## 2021-07-23 NOTE — Assessment & Plan Note (Signed)
Continue atorvastatin and aspirin ?

## 2021-07-23 NOTE — Assessment & Plan Note (Signed)
Well-controlled on current regimen. ?

## 2021-07-23 NOTE — Assessment & Plan Note (Signed)
Well controlled on current regimen of Trazodone and melatonin.

## 2021-07-23 NOTE — Assessment & Plan Note (Signed)
Patient does not wear his CPAP as he reports he is unable to tolerate it.

## 2021-07-23 NOTE — Assessment & Plan Note (Signed)
Diet controlled.  

## 2021-07-23 NOTE — Assessment & Plan Note (Signed)
Well controlled on current regimen of Xyzal and Flonase.

## 2021-07-31 ENCOUNTER — Other Ambulatory Visit: Payer: Self-pay | Admitting: Family Medicine

## 2021-07-31 ENCOUNTER — Ambulatory Visit (HOSPITAL_COMMUNITY)
Admission: RE | Admit: 2021-07-31 | Discharge: 2021-07-31 | Disposition: A | Discharge status: Home / Self Care | Payer: Medicare HMO | Source: Ambulatory Visit | Attending: Urology | Admitting: Urology

## 2021-07-31 ENCOUNTER — Ambulatory Visit (HOSPITAL_COMMUNITY)
Admission: RE | Admit: 2021-07-31 | Discharge: 2021-07-31 | Disposition: A | Discharge status: Home / Self Care | Payer: Medicare HMO | Source: Ambulatory Visit | Attending: Physician Assistant | Admitting: Physician Assistant

## 2021-07-31 ENCOUNTER — Ambulatory Visit (HOSPITAL_COMMUNITY)
Admission: RE | Admit: 2021-07-31 | Discharge: 2021-07-31 | Disposition: A | Discharge status: Home / Self Care | Payer: Medicare HMO | Source: Ambulatory Visit | Attending: Neurosurgery | Admitting: Neurosurgery

## 2021-07-31 DIAGNOSIS — M542 Cervicalgia: Secondary | ICD-10-CM | POA: Diagnosis not present

## 2021-07-31 DIAGNOSIS — N200 Calculus of kidney: Secondary | ICD-10-CM

## 2021-07-31 DIAGNOSIS — R109 Unspecified abdominal pain: Secondary | ICD-10-CM | POA: Diagnosis not present

## 2021-07-31 DIAGNOSIS — M47812 Spondylosis without myelopathy or radiculopathy, cervical region: Secondary | ICD-10-CM | POA: Insufficient documentation

## 2021-08-01 ENCOUNTER — Other Ambulatory Visit: Payer: Self-pay | Admitting: *Deleted

## 2021-08-01 DIAGNOSIS — K219 Gastro-esophageal reflux disease without esophagitis: Secondary | ICD-10-CM

## 2021-08-01 MED ORDER — OMEPRAZOLE 20 MG PO CPDR: 20.0000 mg | DELAYED_RELEASE_CAPSULE | Freq: Every evening | ORAL | 0 refills | Status: DC

## 2021-08-07 ENCOUNTER — Telehealth: Payer: Self-pay

## 2021-08-07 ENCOUNTER — Telehealth: Payer: Self-pay | Admitting: Urology

## 2021-08-07 NOTE — Telephone Encounter (Signed)
-----   Message from Reynaldo Minium, [place] sent at 08/07/2021  1:18 PM EDT ----- Please let pt know his FU KUB showed one stone in each kidney consistent with previous x rays. The [place] he had showed a shadow indicating the left sided stone may be much larger than the 52m stone we knew was there, but the KUB confirms it is still the same size and no change in current plan/follow-up is indicated. ----- Message ----- From: BAudie Box CMA Sent: 08/02/2021   9:25 AM EDT To: PCleon Gustin MD  Please review.

## 2021-08-07 NOTE — Telephone Encounter (Signed)
Pt LMOM returning call

## 2021-08-07 NOTE — Telephone Encounter (Signed)
Patient aware and will f/u as scheduled.  

## 2021-08-09 DIAGNOSIS — Z6832 Body mass index (BMI) 32.0-32.9, adult: Secondary | ICD-10-CM | POA: Diagnosis not present

## 2021-08-09 DIAGNOSIS — M47812 Spondylosis without myelopathy or radiculopathy, cervical region: Secondary | ICD-10-CM | POA: Diagnosis not present

## 2021-08-26 ENCOUNTER — Encounter: Payer: Self-pay | Admitting: *Deleted

## 2021-09-05 ENCOUNTER — Encounter: Payer: Self-pay | Admitting: Family Medicine

## 2021-09-05 ENCOUNTER — Telehealth: Payer: Self-pay | Admitting: Family Medicine

## 2021-09-05 ENCOUNTER — Ambulatory Visit (INDEPENDENT_AMBULATORY_CARE_PROVIDER_SITE_OTHER): Payer: Medicare HMO | Admitting: Family Medicine

## 2021-09-05 VITALS — BP 139/68 | HR 57 | Temp 97.3°F | Ht 69.0 in | Wt 228.8 lb

## 2021-09-05 DIAGNOSIS — R600 Localized edema: Secondary | ICD-10-CM | POA: Diagnosis not present

## 2021-09-05 DIAGNOSIS — I872 Venous insufficiency (chronic) (peripheral): Secondary | ICD-10-CM | POA: Diagnosis not present

## 2021-09-05 MED ORDER — FUROSEMIDE 20 MG PO TABS: 20.0000 mg | ORAL_TABLET | Freq: Every day | ORAL | 5 refills | Status: DC | PRN

## 2021-09-05 NOTE — Telephone Encounter (Signed)
Patient aware and verbalized understanding. °

## 2021-09-05 NOTE — Telephone Encounter (Signed)
Ok to switch 

## 2021-09-05 NOTE — Progress Notes (Signed)
Subjective:  Patient ID: Ronald Berger, male    DOB: 1945-04-30  Age: 76 y.o. MRN: [id number]  CC: Rash (Bilateral lower leg)   HPI RIGLEY NIESS presents for rash  for a few days that has started to fade. Noted on legs. Swelling with it.No dyspnea. Pt. Is on his feet a lot. Has had venous US legs and also arterial dopplers that have been normal recently     09/05/2021   11:35 AM 07/17/2021    4:17 PM 05/30/2021    1:46 PM  Depression screen PHQ 2/9  Decreased Interest 0 0 0  Down, Depressed, Hopeless 0 0 0  PHQ - 2 Score 0 0 0  Altered sleeping  0 1  Tired, decreased energy  0 0  Change in appetite  0 0  Feeling bad or failure about yourself   0 0  Trouble concentrating  0 0  Moving slowly or fidgety/restless  0 0  Suicidal thoughts  0 0  PHQ-9 Score  0 1  Difficult doing work/chores  Not difficult at all Not difficult at all    History Chasin has a past medical history of Allergic rhinitis, Allergy, Asthma due to environmental allergies, Bilateral carpal tunnel syndrome (12/08/2018), CAD, multiple vessel (CARDIOLOGIST-  DR HILTY), Chronic kidney disease, Dyslipidemia, Family history of bone cancer, GERD (gastroesophageal reflux disease), History of kidney stones, History of non-ST elevation myocardial infarction (NSTEMI), History of palpitations, Hyperlipidemia, Hypertension, Insomnia (12/08/2018), Myocardial infarction ([place]) (2015), OSA (obstructive sleep apnea), Pneumonia due to COVID-19 virus, Right knee meniscal tear, S/P drug eluting coronary stent placement, Sinus bradycardia, and Sleep apnea.   He has a past surgical history that includes left heart catheterization with coronary angiogram (N/A, 08/05/2013); Lumbar spine surgery (x3  last one 1980's); Extracorporeal shock wave lithotripsy (x2 last one 2005); Shoulder arthroscopy (Right, 2013); Knee arthroscopy (Right, 05/02/2015); Colonoscopy; Cataract extraction, bilateral; Tonsillectomy; Lithotripsy; and  Extracorporeal shock wave lithotripsy (Right, 05/28/2021).   His family history includes Alzheimer's disease in his father; Aneurysm in his brother; Bone cancer in his sister and sister; CAD in his sister; Coronary artery disease in his mother; Heart failure in his sister.He reports that he quit smoking about 55 years ago. His smoking use included cigarettes. He has never used smokeless tobacco. He reports current alcohol use. He reports that he does not use drugs.    ROS Review of Systems  Constitutional:  Negative for fever.  Respiratory:  Negative for shortness of breath.   Cardiovascular:  Positive for leg swelling. Negative for chest pain and palpitations.  Musculoskeletal:  Negative for arthralgias.  Skin:  Negative for rash.    Objective:  BP 139/68   Pulse (!) 57   Temp (!) 97.3 F (36.3 C)   Ht '5\' 9"'  (1.753 m)   Wt 228 lb 12.8 oz (103.8 kg)   SpO2 97%   BMI 33.79 kg/m   BP Readings from Last 3 Encounters:  09/05/21 139/68  07/17/21 (!) 148/86  07/08/21 (!) 169/85    Wt Readings from Last 3 Encounters:  09/05/21 228 lb 12.8 oz (103.8 kg)  07/17/21 228 lb 6.4 oz (103.6 kg)  06/11/21 213 lb (96.6 kg)     Physical Exam Vitals reviewed.  Constitutional:      Appearance: He is well-developed.  HENT:     Head: Normocephalic and atraumatic.     Right Ear: External ear normal.     Left Ear: External ear normal.     Mouth/Throat:  Pharynx: No oropharyngeal exudate or posterior oropharyngeal erythema.  Eyes:     Pupils: Pupils are equal, round, and reactive to light.  Cardiovascular:     Rate and Rhythm: Normal rate and regular rhythm.     Heart sounds: No murmur heard. Pulmonary:     Effort: No respiratory distress.     Breath sounds: Normal breath sounds.  Musculoskeletal:     Cervical back: Normal range of motion and neck supple.  Skin:    General: Skin is warm and dry.     Findings: Erythema (hyperemia with brown mottling of dependent edema.) present.   Neurological:     Mental Status: He is alert and oriented to person, place, and time.       Assessment & Plan:   Jhalil was seen today for rash.  Diagnoses and all orders for this visit:  Localized edema -     BMP8+EGFR  Other orders -     furosemide (LASIX) 20 MG tablet; Take 1 tablet (20 mg total) by mouth daily as needed (swelling). For swelling       I am having Jaxtin R. Mehra start on furosemide. I am also having him maintain his aspirin, Emergen-C Blue, EPINEPHrine, fluticasone-salmeterol, albuterol, montelukast, levocetirizine, alfuzosin, fluticasone, methocarbamol, traZODone, metoprolol tartrate, atorvastatin, diclofenac, nitroGLYCERIN, and omeprazole.  Allergies as of 09/05/2021       Reactions   Bee Venom Shortness Of Breath   Oxycodone Nausea And Vomiting   Percocet [oxycodone-acetaminophen] Nausea And Vomiting        Medication List        Accurate as of September 05, 2021 11:59 PM. If you have any questions, ask your nurse or doctor.          albuterol 108 (90 Base) MCG/ACT inhaler Commonly known as: VENTOLIN HFA INHALE 2 PUFFS INTO THE LUNGS EVERY 6  HOURS AS NEEDED FOR WHEEZING OR SHORTNESS OF BREATH.   alfuzosin 10 MG 24 hr tablet Commonly known as: UROXATRAL Take 1 tablet (10 mg total) by mouth daily with breakfast. Please stop Silodosin 50m and start taking Alfuzosin   aspirin 81 MG chewable tablet Chew 1 tablet (81 mg total) by mouth daily.   atorvastatin 40 MG tablet Commonly known as: LIPITOR Take 1 tablet (40 mg total) by mouth daily.   diclofenac 75 MG EC tablet Commonly known as: VOLTAREN TAKE 1 TABLET TWICE DAILY   Emergen-C Blue Pack Take 1 each by mouth daily.   EPINEPHrine 0.3 mg/0.3 mL Soaj injection Commonly known as: EPI-PEN INJECT 0.3 MG INTO THE MUSCLE AS NEEDED FOR ANAPHYLAXIS.   fluticasone 50 MCG/ACT nasal spray Commonly known as: FLONASE USE 2 SPRAYS IN EACH NOSTRIL EVERY DAY   fluticasone-salmeterol  250-50 MCG/ACT Aepb Commonly known as: ADVAIR Inhale 1 puff into the lungs in the morning and at bedtime.   furosemide 20 MG tablet Commonly known as: LASIX Take 1 tablet (20 mg total) by mouth daily as needed (swelling). For swelling Started by: WClaretta Fraise MD   levocetirizine 5 MG tablet Commonly known as: XYZAL TAKE 1 TABLET EVERY EVENING   methocarbamol 500 MG tablet Commonly known as: ROBAXIN Take 1 tablet (500 mg total) by mouth every 8 (eight) hours as needed for muscle spasms.   metoprolol tartrate 25 MG tablet Commonly known as: LOPRESSOR Take 0.5 tablets (12.5 mg total) by mouth 2 (two) times daily.   montelukast 10 MG tablet Commonly known as: SINGULAIR TAKE 1 TABLET AT BEDTIME   nitroGLYCERIN 0.4 MG  SL tablet Commonly known as: NITROSTAT PLACE 1 TABLET (0.4 MG TOTAL) UNDER THE TONGUE EVERY 5 (FIVE) MINUTES AS NEEDED FOR CHEST PAIN.   omeprazole 20 MG capsule Commonly known as: PRILOSEC Take 1 capsule (20 mg total) by mouth every evening.   traZODone 50 MG tablet Commonly known as: DESYREL Take 1 tablet (50 mg total) by mouth at bedtime.         Follow-up: Return if symptoms worsen or fail to improve.  Claretta Fraise, M.D.

## 2021-09-06 LAB — BMP8+EGFR
BUN/Creatinine Ratio: 15 (ref 10–24)
BUN: 16 mg/dL (ref 8–27)
CO2: 21 mmol/L (ref 20–29)
Calcium: 8.8 mg/dL (ref 8.6–10.2)
Chloride: 106 mmol/L (ref 96–106)
Creatinine, Ser: 1.04 mg/dL (ref 0.76–1.27)
Glucose: 121 mg/dL — ABNORMAL HIGH (ref 70–99)
Potassium: 3.8 mmol/L (ref 3.5–5.2)
Sodium: 140 mmol/L (ref 134–144)
eGFR: 75 mL/min/{1.73_m2} (ref >59)

## 2021-09-08 ENCOUNTER — Encounter: Payer: Self-pay | Admitting: Family Medicine

## 2021-09-08 NOTE — Progress Notes (Signed)
Hello Conor,  Your lab result is normal and/or stable.Some minor variations that are not significant are commonly marked abnormal, but do not represent any medical problem for you.  Best regards, Claretta Fraise, M.D.

## 2021-09-15 ENCOUNTER — Other Ambulatory Visit: Payer: Self-pay | Admitting: Family Medicine

## 2021-09-15 DIAGNOSIS — I252 Old myocardial infarction: Secondary | ICD-10-CM

## 2021-09-15 DIAGNOSIS — E785 Hyperlipidemia, unspecified: Secondary | ICD-10-CM

## 2021-09-15 DIAGNOSIS — I2510 Atherosclerotic heart disease of native coronary artery without angina pectoris: Secondary | ICD-10-CM

## 2021-10-08 ENCOUNTER — Other Ambulatory Visit: Payer: Self-pay

## 2021-10-08 DIAGNOSIS — N200 Calculus of kidney: Secondary | ICD-10-CM

## 2021-10-09 ENCOUNTER — Ambulatory Visit: Payer: Medicare HMO | Admitting: Urology

## 2021-10-09 ENCOUNTER — Ambulatory Visit (HOSPITAL_COMMUNITY)
Admission: RE | Admit: 2021-10-09 | Discharge: 2021-10-09 | Disposition: A | Discharge status: Home / Self Care | Payer: Medicare HMO | Source: Ambulatory Visit | Attending: Urology | Admitting: Urology

## 2021-10-09 VITALS — BP 161/76 | HR 59

## 2021-10-09 DIAGNOSIS — N200 Calculus of kidney: Secondary | ICD-10-CM | POA: Insufficient documentation

## 2021-10-09 DIAGNOSIS — N138 Other obstructive and reflux uropathy: Secondary | ICD-10-CM | POA: Diagnosis not present

## 2021-10-09 DIAGNOSIS — R972 Elevated prostate specific antigen [PSA]: Secondary | ICD-10-CM

## 2021-10-09 DIAGNOSIS — N401 Enlarged prostate with lower urinary tract symptoms: Secondary | ICD-10-CM

## 2021-10-09 DIAGNOSIS — M419 Scoliosis, unspecified: Secondary | ICD-10-CM | POA: Diagnosis not present

## 2021-10-09 DIAGNOSIS — R351 Nocturia: Secondary | ICD-10-CM

## 2021-10-09 DIAGNOSIS — M47816 Spondylosis without myelopathy or radiculopathy, lumbar region: Secondary | ICD-10-CM | POA: Diagnosis not present

## 2021-10-09 NOTE — Progress Notes (Unsigned)
10/09/2021 4:04 PM   Ronald Berger 12-09-1945 [id number]  Referring provider: Loman Brooklyn, [place] [place],  [place] [address]  No chief complaint on file.   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 NSTEMI 07/2013 - DES to mLCx, DES to prox OM1, DES to mLAD with nonobstructive RCA stenosis, EF 55-65%.   Chronic kidney disease    kidney stones    Dyslipidemia    Family history of bone cancer    GERD (gastroesophageal reflux disease)    History of kidney stones    History of non-ST elevation myocardial infarction (NSTEMI)    07/ 2015  S/P  DES X3   History of palpitations    Hyperlipidemia    Hypertension    Insomnia 12/08/2018   x many years   Myocardial infarction ([place]) 2015   NSTEMI- 3 stents    OSA (obstructive sleep apnea)    cpap intolerant   Pneumonia due to COVID-19 virus    Right knee meniscal tear    S/P drug eluting coronary stent placement    07/ 2015  x3  to mLCFX, OM1, mLAD   Sinus bradycardia    Sleep apnea    no cpap     Surgical History: Past Surgical History:  Procedure Laterality Date   CATARACT EXTRACTION, BILATERAL     COLONOSCOPY     >10 yrs ago-polyps per pt- we have no report    EXTRACORPOREAL SHOCK WAVE LITHOTRIPSY  x2 last one 2005   EXTRACORPOREAL SHOCK WAVE LITHOTRIPSY Right 05/28/2021   Procedure: EXTRACORPOREAL SHOCK WAVE LITHOTRIPSY (ESWL);  Surgeon: Primus Bravo., MD;  Location: [facility];  Service: Urology;  Laterality: Right;   KNEE ARTHROSCOPY Right 05/02/2015   Procedure: RIGHT KNEE ARTHROSCOPY WITH medial meniscal DEBRIDEMENT ;  Surgeon: Gaynelle Arabian, MD;  Location: [facility];  Service: Orthopedics;  Laterality: Right;    LEFT HEART CATHETERIZATION WITH CORONARY ANGIOGRAM N/A 08/05/2013   Procedure: LEFT HEART CATHETERIZATION WITH CORONARY ANGIOGRAM;  Surgeon: Blane Ohara, MD;  Location: [facility] CATH LAB;  Service: Cardiovascular;  Laterality: N/A;  Promus DES's to  mLCFx,  OM1,  mLAD (total 3)/   mRCA 30%,  dLM  20-30%,  preserved LVSF, ef 55-65%   LITHOTRIPSY     LUMBAR SPINE SURGERY  x3  last one 1980's   SHOULDER ARTHROSCOPY Right 2013   TONSILLECTOMY      Home Medications:  Allergies as of 10/09/2021       Reactions   Bee Venom Shortness Of Breath   Oxycodone Nausea And Vomiting   Percocet [oxycodone-acetaminophen] Nausea And Vomiting        Medication List        Accurate as of October 09, 2021  4:04 PM. If you have any questions, ask your nurse or doctor.          albuterol 108 (90 Base) MCG/ACT inhaler Commonly known as: VENTOLIN HFA INHALE 2 PUFFS INTO THE LUNGS EVERY 6  HOURS AS NEEDED FOR WHEEZING OR SHORTNESS OF BREATH.   alfuzosin 10 MG 24 hr tablet Commonly known as: UROXATRAL Take 1 tablet (10 mg total) by  mouth daily with breakfast. Please stop Silodosin '8mg'$  and start taking Alfuzosin   aspirin 81 MG chewable tablet Chew 1 tablet (81 mg total) by mouth daily.   atorvastatin 40 MG tablet Commonly known as: LIPITOR TAKE 1 TABLET EVERY DAY   diclofenac 75 MG EC tablet Commonly known as: VOLTAREN TAKE 1 TABLET TWICE DAILY   Emergen-C Blue Pack Take 1 each by mouth daily.   EPINEPHrine 0.3 mg/0.3 mL Soaj injection Commonly known as: EPI-PEN INJECT 0.3 MG INTO THE MUSCLE AS NEEDED FOR ANAPHYLAXIS.   fluticasone 50 MCG/ACT nasal spray Commonly known as: FLONASE USE 2 SPRAYS IN EACH NOSTRIL EVERY DAY   fluticasone-salmeterol 250-50 MCG/ACT Aepb Commonly known as: ADVAIR Inhale 1 puff into the lungs in the morning and at bedtime.   furosemide 20 MG tablet Commonly known as: LASIX Take 1 tablet (20 mg total) by mouth daily as needed (swelling). For swelling    levocetirizine 5 MG tablet Commonly known as: XYZAL TAKE 1 TABLET EVERY EVENING   methocarbamol 500 MG tablet Commonly known as: ROBAXIN Take 1 tablet (500 mg total) by mouth every 8 (eight) hours as needed for muscle spasms.   metoprolol tartrate 25 MG tablet Commonly known as: LOPRESSOR Take 0.5 tablets (12.5 mg total) by mouth 2 (two) times daily.   montelukast 10 MG tablet Commonly known as: SINGULAIR TAKE 1 TABLET AT BEDTIME   nitroGLYCERIN 0.4 MG SL tablet Commonly known as: NITROSTAT PLACE 1 TABLET (0.4 MG TOTAL) UNDER THE TONGUE EVERY 5 (FIVE) MINUTES AS NEEDED FOR CHEST PAIN.   omeprazole 20 MG capsule Commonly known as: PRILOSEC Take 1 capsule (20 mg total) by mouth every evening.   traZODone 50 MG tablet Commonly known as: DESYREL Take 1 tablet (50 mg total) by mouth at bedtime.        Allergies:  Allergies  Allergen Reactions   Bee Venom Shortness Of Breath   Oxycodone Nausea And Vomiting   Percocet [Oxycodone-Acetaminophen] Nausea And Vomiting    Family History: Family History  Problem Relation Age of Onset   Coronary artery disease Mother        CABG   CAD Sister    Bone cancer Sister    Alzheimer's disease Father    Aneurysm Brother        brain   Heart failure Sister    Bone cancer Sister    Colon cancer Neg Hx    Colon polyps Neg Hx    Esophageal cancer Neg Hx    Rectal cancer Neg Hx    Stomach cancer Neg Hx     Social History:  reports that he quit smoking about 55 years ago. His smoking use included cigarettes. He has never used smokeless tobacco. He reports current alcohol use. He reports that he does not use drugs.  ROS: All other review of systems were reviewed and are negative except what is noted above in HPI  Physical Exam: BP (!) 161/76   Pulse (!) 59   Constitutional:  Alert and oriented, No acute distress. HEENT: [place] AT, moist mucus membranes.  Trachea midline, no masses. Cardiovascular: No clubbing, cyanosis, or  edema. Respiratory: Normal respiratory effort, no increased work of breathing. GI: Abdomen is soft, nontender, nondistended, no abdominal masses GU: No CVA tenderness.  Lymph: No cervical or inguinal lymphadenopathy. Skin: No rashes, bruises or suspicious lesions. Neurologic: Grossly intact, no focal deficits, moving all 4 extremities. Psychiatric: Normal mood and affect.  Laboratory Data: Lab Results  Component Value Date  WBC 12.9 (H) 05/29/2021   HGB 14.2 05/29/2021   HCT 44.1 05/29/2021   MCV 86.6 05/29/2021   PLT 278 05/29/2021    Lab Results  Component Value Date   CREATININE 1.04 09/05/2021    No results found for: "PSA"  No results found for: "TESTOSTERONE"  Lab Results  Component Value Date   HGBA1C 5.6 03/04/2021    Urinalysis    Component Value Date/Time   COLORURINE YELLOW 02/20/2020 2218   APPEARANCEUR Clear 07/08/2021 1520   LABSPEC 1.023 02/20/2020 2218   PHURINE 5.0 02/20/2020 2218   GLUCOSEU Negative 07/08/2021 1520   HGBUR NEGATIVE 02/20/2020 2218   BILIRUBINUR Negative 07/08/2021 [place] 02/20/2020 2218   PROTEINUR Negative 07/08/2021 1520   PROTEINUR NEGATIVE 02/20/2020 2218   NITRITE Negative 07/08/2021 1520   NITRITE NEGATIVE 02/20/2020 2218   LEUKOCYTESUR Negative 07/08/2021 [place] 02/20/2020 2218    Lab Results  Component Value Date   LABMICR Comment 07/08/2021   WBCUA None seen 05/15/2021   LABEPIT None seen 05/15/2021   BACTERIA None seen 05/15/2021    Pertinent Imaging: *** Results for orders placed during the hospital encounter of 07/31/21  Abdomen 1 view (KUB)  Narrative CLINICAL DATA:  Nephrolithiasis.  Intermittent pain.  EXAM: ABDOMEN - 1 VIEW  COMPARISON:  06/11/2021, 05/28/2021  FINDINGS: No bowel dilatation to suggest obstruction. No evidence of pneumoperitoneum, portal venous gas or pneumatosis.  No pathologic calcifications along the expected course of  the ureters. Bilateral nephrolithiasis.  No acute osseous abnormality.  IMPRESSION: 1. Bilateral nephrolithiasis.   Electronically Signed By: Kathreen Devoid M.D. On: 08/02/2021 09:01  No results found for this or any previous visit.  No results found for this or any previous visit.  No results found for this or any previous visit.  Results for orders placed during the hospital encounter of 07/31/21  Ultrasound renal complete  Narrative CLINICAL DATA:  Nephrolithiasis.  EXAM: RENAL / URINARY TRACT ULTRASOUND COMPLETE  COMPARISON:  CT scan May 29, 2021  FINDINGS: Right Kidney:  Renal measurements: 12.3 x 5.2 x 5.8 cm = volume: 192 mL. Contains a 9 mm nonobstructive stone.  Left Kidney:  Renal measurements: 11.2 x 5.9 x 5.4 cm = volume: 187 mL. Contains a 12 mm nonobstructive stone. Contains a 4.8 cm cyst of no significance. No follow-up imaging recommended for the cyst.  Bladder:  Appears normal for degree of bladder distention.  Other:  None.  IMPRESSION: 1. Bilateral nonobstructive renal stones as above. 2. No other significant abnormalities.   Electronically Signed By: Dorise Bullion III M.D. On: 08/01/2021 14:10  No results found for this or any previous visit.  No results found for this or any previous visit.  Results for orders placed in visit on 05/15/21  CT RENAL STONE STUDY  Narrative CLINICAL DATA:  Bilateral lower abdominal pain x1 week, history of kidney stones.  EXAM: CT ABDOMEN AND PELVIS WITHOUT CONTRAST  TECHNIQUE: Multidetector CT imaging of the abdomen and pelvis was performed following the standard protocol without IV contrast.  RADIATION DOSE REDUCTION: This exam was performed according to the departmental dose-optimization program which includes automated exposure control, adjustment of the mA and/or kV according to patient size and/or use of iterative reconstruction technique.  COMPARISON:  Same day abdominal  radiograph.  FINDINGS: Lower chest: Coronary artery calcifications. Borderline cardiac enlargement. Bibasilar scarring versus atelectasis.  Hepatobiliary: No suspicious hepatic lesion on this noncontrast examination. Gallbladder is unremarkable. No biliary  ductal dilation.  Pancreas: No pancreatic ductal dilation or evidence of acute inflammation.  Spleen: No splenomegaly or focal splenic lesion.  Adrenals/Urinary Tract: Bilateral adrenal glands appear normal.  No hydronephrosis. Nonobstructive bilateral renal stones measuring up to 7 mm in the right lower pole and 4 mm in the left lower pole. No obstructive ureteral or bladder calculi identified.  Fluid density left lower pole renal lesion measures 2.8 x 2.1 x 4.9 cm is considered benign given the fluid attenuation of the lesion without evidence of complexity on noncontrast examination and most consistent with a single renal cyst or an adjacent renal sinus and cortical renal cysts.  Stomach/Bowel: No radiopaque enteric contrast material was administered. Stomach is unremarkable for degree of distension. No pathologic dilation of small or large bowel. The appendix and terminal ileum appear normal. No evidence of acute bowel inflammation.  Vascular/Lymphatic: Aortic and branch vessel atherosclerosis without abdominal aortic aneurysm. No pathologically enlarged abdominal or pelvic lymph nodes.  Reproductive: Prostate is unremarkable.  Other: No significant abdominopelvic free fluid.  Musculoskeletal: Multilevel degenerative changes spine. 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  IMPRESSION: 1. Nonobstructive bilateral renal stones measuring up to 7 mm in the right lower pole and 4 mm in the left lower pole. No obstructive ureteral or bladder calculi identified. 2. 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 3.  Aortic Atherosclerosis (ICD10-I70.0).   Electronically Signed By: Dahlia Bailiff M.D. On: 05/15/2021 15:31   Assessment & Plan:    1. Kidney stones -RTC 6 months with KUB - Urinalysis, Routine w reflex microscopic  2. Benign prostatic hyperplasia with urinary obstruction ***  3. Nocturia ***   No follow-ups on file.  Nicolette Bang, MD  [facility] [place]

## 2021-10-10 ENCOUNTER — Encounter: Payer: Self-pay | Admitting: Urology

## 2021-10-10 MED ORDER — ALFUZOSIN HCL ER 10 MG PO TB24: 10.0000 mg | ORAL_TABLET | Freq: Every day | ORAL | 11 refills | Status: DC

## 2021-10-10 NOTE — Patient Instructions (Signed)

## 2021-10-11 LAB — URINALYSIS, ROUTINE W REFLEX MICROSCOPIC
Bilirubin, UA: NEGATIVE
Glucose, UA: NEGATIVE
Ketones, UA: NEGATIVE
Leukocytes,UA: NEGATIVE
Nitrite, UA: NEGATIVE
Protein,UA: NEGATIVE
Specific Gravity, UA: 1.02 (ref 1.005–1.030)
Urobilinogen, Ur: 0.2 mg/dL (ref 0.2–1.0)
pH, UA: 6 (ref 5.0–7.5)

## 2021-10-11 LAB — MICROSCOPIC EXAMINATION
Bacteria, UA: NONE SEEN
Epithelial Cells (non renal): NONE SEEN /hpf (ref 0–10)

## 2021-10-17 ENCOUNTER — Other Ambulatory Visit: Payer: Self-pay | Admitting: Family Medicine

## 2021-10-17 DIAGNOSIS — K219 Gastro-esophageal reflux disease without esophagitis: Secondary | ICD-10-CM

## 2021-10-22 ENCOUNTER — Ambulatory Visit (INDEPENDENT_AMBULATORY_CARE_PROVIDER_SITE_OTHER): Payer: Medicare HMO | Admitting: Family

## 2021-10-22 ENCOUNTER — Ambulatory Visit: Payer: Medicare HMO | Admitting: Family Medicine

## 2021-10-22 ENCOUNTER — Ambulatory Visit: Payer: Medicare HMO | Admitting: Nurse Practitioner

## 2021-10-22 ENCOUNTER — Encounter: Payer: Self-pay | Admitting: Family

## 2021-10-22 VITALS — BP 134/75 | HR 55 | Temp 97.0°F | Ht 69.0 in | Wt 226.0 lb

## 2021-10-22 DIAGNOSIS — R7303 Prediabetes: Secondary | ICD-10-CM

## 2021-10-22 DIAGNOSIS — I252 Old myocardial infarction: Secondary | ICD-10-CM | POA: Diagnosis not present

## 2021-10-22 DIAGNOSIS — G4733 Obstructive sleep apnea (adult) (pediatric): Secondary | ICD-10-CM

## 2021-10-22 DIAGNOSIS — Z125 Encounter for screening for malignant neoplasm of prostate: Secondary | ICD-10-CM | POA: Diagnosis not present

## 2021-10-22 DIAGNOSIS — K219 Gastro-esophageal reflux disease without esophagitis: Secondary | ICD-10-CM

## 2021-10-22 DIAGNOSIS — Z0001 Encounter for general adult medical examination with abnormal findings: Secondary | ICD-10-CM

## 2021-10-22 DIAGNOSIS — E785 Hyperlipidemia, unspecified: Secondary | ICD-10-CM | POA: Diagnosis not present

## 2021-10-22 DIAGNOSIS — Z1211 Encounter for screening for malignant neoplasm of colon: Secondary | ICD-10-CM

## 2021-10-22 DIAGNOSIS — J4541 Moderate persistent asthma with (acute) exacerbation: Secondary | ICD-10-CM | POA: Diagnosis not present

## 2021-10-22 DIAGNOSIS — I2510 Atherosclerotic heart disease of native coronary artery without angina pectoris: Secondary | ICD-10-CM | POA: Diagnosis not present

## 2021-10-22 DIAGNOSIS — Z23 Encounter for immunization: Secondary | ICD-10-CM

## 2021-10-22 DIAGNOSIS — Z0000 Encounter for general adult medical examination without abnormal findings: Secondary | ICD-10-CM

## 2021-10-22 DIAGNOSIS — M542 Cervicalgia: Secondary | ICD-10-CM | POA: Diagnosis not present

## 2021-10-22 DIAGNOSIS — G5603 Carpal tunnel syndrome, bilateral upper limbs: Secondary | ICD-10-CM | POA: Diagnosis not present

## 2021-10-22 DIAGNOSIS — F5101 Primary insomnia: Secondary | ICD-10-CM | POA: Diagnosis not present

## 2021-10-22 DIAGNOSIS — R37 Sexual dysfunction, unspecified: Secondary | ICD-10-CM | POA: Diagnosis not present

## 2021-10-22 MED ORDER — DICLOFENAC SODIUM 75 MG PO TBEC: 75.0000 mg | DELAYED_RELEASE_TABLET | Freq: Two times a day (BID) | ORAL | 2 refills | Status: DC

## 2021-10-22 MED ORDER — METHOCARBAMOL 500 MG PO TABS: 500.0000 mg | ORAL_TABLET | Freq: Three times a day (TID) | ORAL | 1 refills | Status: DC | PRN

## 2021-10-22 NOTE — Progress Notes (Signed)
Subjective:    Patient ID: Ronald Berger, male    DOB: 1945/02/07, 76 y.o.   MRN: [id number]  Chief Complaint  Patient presents with   Medical Management of Chronic Issues    Transfer care from [facility]    Pt presents to the office today chronic follow up. He is followed by nephrologists for every 6 months for kidneys stones. Followed by Ortho every 6 month for chronic neck pain.   He has CAD and has hs NSTEMI and takes atorvastatin daily. Followed by Cardiologists every other year.   Has OSA, but does not wear CPAP. Has been years since last sleep study.   Complaining of bilateral wrist numbness and pain that is worse at night. States it wakes him up at night. Reports his pain is a 7 out 10.  Asthma He complains of cough, hoarse voice, shortness of breath and wheezing. This is a chronic problem. The current episode started more than 1 year ago. The problem occurs intermittently. The problem has been waxing and waning. Associated symptoms include heartburn. His symptoms are aggravated by change in weather. His symptoms are alleviated by rest. He reports moderate improvement on treatment. His past medical history is significant for asthma.  Gastroesophageal Reflux He complains of belching, coughing, heartburn, a hoarse voice and wheezing. This is a chronic problem. The current episode started more than 1 year ago. The problem occurs occasionally. Risk factors include obesity. He has tried a PPI for the symptoms. The treatment provided moderate relief.  Diabetes He presents for his follow-up diabetic visit. Diabetes type: prediabetic. Pertinent negatives for diabetes include no blurred vision and no foot paresthesias. Risk factors for coronary artery disease include dyslipidemia, diabetes mellitus and sedentary lifestyle. (Does not check at home) Eye exam is not current.  Insomnia Primary symptoms: difficulty falling asleep, frequent awakening.   The current episode started more than one  year. The onset quality is sudden. Past treatments include medication. The treatment provided moderate relief.  Hyperlipidemia This is a chronic problem. The current episode started more than 1 year ago. The problem is controlled. Recent lipid tests were reviewed and are normal. Exacerbating diseases include obesity. Associated symptoms include shortness of breath. Current antihyperlipidemic treatment includes statins. The current treatment provides moderate improvement of lipids. Risk factors for coronary artery disease include dyslipidemia and a sedentary lifestyle.  Neck Pain  This is a chronic problem. The current episode started more than 1 year ago. The problem has been waxing and waning. The quality of the pain is described as aching. The pain is at a severity of 3/10. The pain is mild.      Review of Systems  HENT:  Positive for hoarse voice.   Eyes:  Negative for blurred vision.  Respiratory:  Positive for cough, shortness of breath and wheezing.   Gastrointestinal:  Positive for heartburn.  Musculoskeletal:  Positive for neck pain.  Psychiatric/Behavioral:  The patient has insomnia.   All other systems reviewed and are negative.  Family History  Problem Relation Age of Onset   Coronary artery disease Mother        CABG   CAD Sister    Bone cancer Sister    Alzheimer's disease Father    Aneurysm Brother        brain   Heart failure Sister    Bone cancer Sister    Colon cancer Neg Hx    Colon polyps Neg Hx    Esophageal cancer Neg Hx  Rectal cancer Neg Hx    Stomach cancer Neg Hx    Social History   Socioeconomic History   Marital status: Married    Spouse name: Joelene Millin   Number of children: 1   Years of education: 12   Highest education level: Some college, no degree  Occupational History   Occupation: [employer]    Comment: Retired - prior Oceanographer  Tobacco Use   Smoking status: Former    Years: 1.00    Types: Cigarettes    Quit date:  04/27/1966    Years since quitting: 55.5   Smokeless tobacco: Never  Vaping Use   Vaping Use: Never used  Substance and Sexual Activity   Alcohol use: Yes    Comment: occasional   Drug use: No   Sexual activity: Yes  Other Topics Concern   Not on file  Social History Narrative   Son lives with them   One level home   Social Determinants of Health   Financial Resource Strain: Low Risk  (04/03/2021)   Overall Financial Resource Strain (CARDIA)    Difficulty of Paying Living Expenses: Not hard at all  Food Insecurity: No Food Insecurity (04/03/2021)   Hunger Vital Sign    Worried About Running Out of Food in the Last Year: Never true    [place] in the Last Year: Never true  Transportation Needs: No Transportation Needs (04/03/2021)   PRAPARE - Hydrologist (Medical): No    Lack of Transportation (Non-Medical): No  Physical Activity: Sufficiently Active (04/03/2021)   Exercise Vital Sign    Days of Exercise per Week: 5 days    Minutes of Exercise per Session: 60 min  Stress: No Stress Concern Present (04/03/2021)   [place]    Feeling of Stress : Not at all  Social Connections: Moderately Isolated (04/03/2021)   Social Connection and Isolation Panel [NHANES]    Frequency of Communication with Friends and Family: Once a week    Frequency of Social Gatherings with Friends and Family: Once a week    Attends Religious Services: Never    Marine scientist or Organizations: Yes    Attends Music therapist: More than 4 times per year    Marital Status: Married        Objective:   Physical Exam Vitals reviewed.  Constitutional:      General: He is not in acute distress.    Appearance: He is well-developed. He is obese.  HENT:     Head: Normocephalic.     Right Ear: Tympanic membrane normal.     Left Ear: Tympanic membrane normal.  Eyes:     General:         Right eye: No discharge.        Left eye: No discharge.     Pupils: Pupils are equal, round, and reactive to light.  Neck:     Thyroid: No thyromegaly.  Cardiovascular:     Rate and Rhythm: Normal rate and regular rhythm.     Heart sounds: Normal heart sounds. No murmur heard. Pulmonary:     Effort: Pulmonary effort is normal. No respiratory distress.     Breath sounds: Normal breath sounds. No wheezing.  Abdominal:     General: Bowel sounds are normal. There is no distension.     Palpations: Abdomen is soft.     Tenderness: There is  no abdominal tenderness.  Musculoskeletal:        General: No tenderness. Normal range of motion.     Cervical back: Normal range of motion and neck supple.  Skin:    General: Skin is warm and dry.     Findings: No erythema or rash.  Neurological:     Mental Status: He is alert and oriented to person, place, and time.     Cranial Nerves: No cranial nerve deficit.     Deep Tendon Reflexes: Reflexes are normal and symmetric.  Psychiatric:        Behavior: Behavior normal.        Thought Content: Thought content normal.        Judgment: Judgment normal.    BP 134/75   Pulse (!) 55   Temp (!) 97 F (36.1 C) (Temporal)   Ht '5\' 9"'  (1.753 m)   Wt 226 lb (102.5 kg)   SpO2 96%   BMI 33.37 kg/m       Assessment & Plan:  Ronald Berger comes in today with chief complaint of Medical Management of Chronic Issues (Transfer care from [place] )   Diagnosis and orders addressed:  1. Cervicalgia - diclofenac (VOLTAREN) 75 MG EC tablet; Take 1 tablet (75 mg total) by mouth 2 (two) times daily.  Dispense: 120 tablet; Refill: 2 - CMP14+EGFR  2. Muscle pain, cervical - methocarbamol (ROBAXIN) 500 MG tablet; Take 1 tablet (500 mg total) by mouth every 8 (eight) hours as needed for muscle spasms.  Dispense: 180 tablet; Refill: 1 - CMP14+EGFR  3. Need for immunization against influenza - Flu Vaccine QUAD High Dose(Fluad) - CMP14+EGFR  4. CAD in  native artery - CMP14+EGFR  5. Moderate persistent asthma with exacerbation - CMP14+EGFR  6. Gastroesophageal reflux disease, unspecified whether esophagitis present - CMP14+EGFR  7. History of non-ST elevation myocardial infarction (NSTEMI) - CMP14+EGFR  8. Hyperlipidemia LDL goal <70 - CMP14+EGFR  9. Primary insomnia - CMP14+EGFR  10. Prediabetes - CMP14+EGFR  11. OSA (obstructive sleep apnea) -Will do referral to Sleep study, dicussed importance CPAP - Ambulatory referral to Sleep Studies - CMP14+EGFR  12. Annual physical exam  - CMP14+EGFR - CBC with Differential/Platelet - Lipid panel - TSH - PSA, total and free  13. Colon cancer screening - Ambulatory referral to Gastroenterology - CMP14+EGFR   14. Bilateral carpal tunnel syndrome Wear splints bilateral at night and when doing repetitive motions. Continue Diclofenac BID. If pain continues will need referral to Hand specialists.   Labs pending Health Maintenance reviewed Diet and exercise encouraged  Follow up plan: 6 months    Evelina Dun, FNP

## 2021-10-22 NOTE — Patient Instructions (Signed)

## 2021-10-23 ENCOUNTER — Other Ambulatory Visit: Payer: Self-pay | Admitting: Family Medicine

## 2021-10-23 DIAGNOSIS — J4540 Moderate persistent asthma, uncomplicated: Secondary | ICD-10-CM

## 2021-10-23 LAB — CBC WITH DIFFERENTIAL/PLATELET
Basophils Absolute: 0.1 10*3/uL (ref 0.0–0.2)
Basos: 1 %
EOS (ABSOLUTE): 0.3 10*3/uL (ref 0.0–0.4)
Eos: 4 %
Hematocrit: 44.2 % (ref 37.5–51.0)
Hemoglobin: 14.6 g/dL (ref 13.0–17.7)
Immature Grans (Abs): 0 10*3/uL (ref 0.0–0.1)
Immature Granulocytes: 0 %
Lymphocytes Absolute: 2.4 10*3/uL (ref 0.7–3.1)
Lymphs: 33 %
MCH: 28.9 pg (ref 26.6–33.0)
MCHC: 33 g/dL (ref 31.5–35.7)
MCV: 87 fL (ref 79–97)
Monocytes Absolute: 0.5 10*3/uL (ref 0.1–0.9)
Monocytes: 7 %
Neutrophils Absolute: 3.9 10*3/uL (ref 1.4–7.0)
Neutrophils: 55 %
Platelets: 212 10*3/uL (ref 150–450)
RBC: 5.06 x10E6/uL (ref 4.14–5.80)
RDW: 12.4 % (ref 11.6–15.4)
WBC: 7.3 10*3/uL (ref 3.4–10.8)

## 2021-10-23 LAB — CMP14+EGFR
ALT: 32 IU/L (ref 0–44)
AST: 23 IU/L (ref 0–40)
Albumin/Globulin Ratio: 1.6 (ref 1.2–2.2)
Albumin: 3.8 g/dL (ref 3.8–4.8)
Alkaline Phosphatase: 106 IU/L (ref 44–121)
BUN/Creatinine Ratio: 15 (ref 10–24)
BUN: 16 mg/dL (ref 8–27)
Bilirubin Total: 0.4 mg/dL (ref 0.0–1.2)
CO2: 22 mmol/L (ref 20–29)
Calcium: 9.1 mg/dL (ref 8.6–10.2)
Chloride: 104 mmol/L (ref 96–106)
Creatinine, Ser: 1.04 mg/dL (ref 0.76–1.27)
Globulin, Total: 2.4 g/dL (ref 1.5–4.5)
Glucose: 137 mg/dL — ABNORMAL HIGH (ref 70–99)
Potassium: 4 mmol/L (ref 3.5–5.2)
Sodium: 138 mmol/L (ref 134–144)
Total Protein: 6.2 g/dL (ref 6.0–8.5)
eGFR: 75 mL/min/{1.73_m2} (ref >59)

## 2021-10-23 LAB — LIPID PANEL
Chol/HDL Ratio: 2.9 ratio (ref 0.0–5.0)
Cholesterol, Total: 120 mg/dL (ref 100–199)
HDL: 41 mg/dL (ref >39)
LDL Chol Calc (NIH): 55 mg/dL (ref 0–99)
Triglycerides: 136 mg/dL (ref 0–149)
VLDL Cholesterol Cal: 24 mg/dL (ref 5–40)

## 2021-10-23 LAB — PSA, TOTAL AND FREE
PSA, Free Pct: 30.4 %
PSA, Free: 1.55 ng/mL
Prostate Specific Ag, Serum: 5.1 ng/mL — ABNORMAL HIGH (ref 0.0–4.0)

## 2021-10-23 LAB — TSH: TSH: 4.93 u[IU]/mL — ABNORMAL HIGH (ref 0.450–4.500)

## 2021-10-24 ENCOUNTER — Other Ambulatory Visit: Payer: Self-pay | Admitting: Family

## 2021-10-28 LAB — HGB A1C W/O EAG: Hgb A1c MFr Bld: 5.9 % — ABNORMAL HIGH (ref 4.8–5.6)

## 2021-10-28 LAB — SPECIMEN STATUS REPORT

## 2021-11-05 ENCOUNTER — Other Ambulatory Visit: Payer: Self-pay | Admitting: Family Medicine

## 2021-11-05 DIAGNOSIS — M542 Cervicalgia: Secondary | ICD-10-CM

## 2021-11-28 ENCOUNTER — Other Ambulatory Visit: Payer: Self-pay | Admitting: Family Medicine

## 2021-11-28 DIAGNOSIS — J4530 Mild persistent asthma, uncomplicated: Secondary | ICD-10-CM

## 2021-11-28 DIAGNOSIS — J301 Allergic rhinitis due to pollen: Secondary | ICD-10-CM

## 2021-12-05 DIAGNOSIS — M47812 Spondylosis without myelopathy or radiculopathy, cervical region: Secondary | ICD-10-CM | POA: Diagnosis not present

## 2021-12-05 DIAGNOSIS — M5412 Radiculopathy, cervical region: Secondary | ICD-10-CM | POA: Diagnosis not present

## 2021-12-05 DIAGNOSIS — Z6833 Body mass index (BMI) 33.0-33.9, adult: Secondary | ICD-10-CM | POA: Diagnosis not present

## 2021-12-24 ENCOUNTER — Ambulatory Visit (INDEPENDENT_AMBULATORY_CARE_PROVIDER_SITE_OTHER): Payer: Medicare HMO | Admitting: Family

## 2021-12-24 ENCOUNTER — Encounter: Payer: Self-pay | Admitting: Family

## 2021-12-24 VITALS — BP 129/76 | HR 65 | Temp 96.9°F | Ht 69.0 in | Wt 227.0 lb

## 2021-12-24 DIAGNOSIS — Z1211 Encounter for screening for malignant neoplasm of colon: Secondary | ICD-10-CM | POA: Diagnosis not present

## 2021-12-24 DIAGNOSIS — I10 Essential (primary) hypertension: Secondary | ICD-10-CM | POA: Diagnosis not present

## 2021-12-24 DIAGNOSIS — R7989 Other specified abnormal findings of blood chemistry: Secondary | ICD-10-CM

## 2021-12-24 DIAGNOSIS — R6889 Other general symptoms and signs: Secondary | ICD-10-CM | POA: Diagnosis not present

## 2021-12-24 NOTE — Patient Instructions (Signed)

## 2021-12-24 NOTE — Progress Notes (Signed)
Subjective:    Patient ID: Ronald Berger, male    DOB: [birth year], 76 y.o.   MRN: [id number]  Chief Complaint  Patient presents with   Hypothyroidism   PT presents to the office today to recheck thyroid levels. He was seen on 10/22/21 TSH was abnormal.  Thyroid Problem Presents for follow-up visit. Patient reports no anxiety, depressed mood, diarrhea, dry skin or fatigue. The symptoms have been stable.  Hypertension This is a chronic problem. The current episode started more than 1 year ago. The problem has been waxing and waning since onset. The problem is uncontrolled. Pertinent negatives include no malaise/fatigue, peripheral edema or shortness of breath. The current treatment provides moderate improvement. Identifiable causes of hypertension include a thyroid problem.      Review of Systems  Constitutional:  Negative for fatigue and malaise/fatigue.  Respiratory:  Negative for shortness of breath.   Gastrointestinal:  Negative for diarrhea.  Psychiatric/Behavioral:  The patient is not nervous/anxious.   All other systems reviewed and are negative.      Objective:   Physical Exam Vitals reviewed.  Constitutional:      General: He is not in acute distress.    Appearance: He is well-developed.  HENT:     Head: Normocephalic.     Right Ear: Tympanic membrane normal.     Left Ear: Tympanic membrane normal.  Eyes:     General:        Right eye: No discharge.        Left eye: No discharge.     Pupils: Pupils are equal, round, and reactive to light.  Neck:     Thyroid: No thyromegaly.  Cardiovascular:     Rate and Rhythm: Normal rate and regular rhythm.     Heart sounds: Normal heart sounds. No murmur heard. Pulmonary:     Effort: Pulmonary effort is normal. No respiratory distress.     Breath sounds: Normal breath sounds. No wheezing.  Abdominal:     General: Bowel sounds are normal. There is no distension.     Palpations: Abdomen is soft.     Tenderness: There  is no abdominal tenderness.  Musculoskeletal:        General: No tenderness. Normal range of motion.     Cervical back: Normal range of motion and neck supple.  Skin:    General: Skin is warm and dry.     Findings: No erythema or rash.  Neurological:     Mental Status: He is alert and oriented to person, place, and time.     Cranial Nerves: No cranial nerve deficit.     Deep Tendon Reflexes: Reflexes are normal and symmetric.  Psychiatric:        Behavior: Behavior normal.        Thought Content: Thought content normal.        Judgment: Judgment normal.       BP (!) 144/73   Pulse 65   Temp (!) 96.9 F (36.1 C) (Temporal)   Ht '5\' 9"'$  (1.753 m)   Wt 227 lb (103 kg)   BMI 33.52 kg/m      Assessment & Plan:   Ronald Berger comes in today with chief complaint of Hypothyroidism   Diagnosis and orders addressed:  1. Abnormal TSH - TSH  2. Colon cancer screening - Ambulatory referral to Gastroenterology  3. Primary hypertension   Health Maintenance reviewed Diet and exercise encouraged  Follow up plan: 6 months  Evelina Dun, FNP

## 2021-12-25 DIAGNOSIS — M5412 Radiculopathy, cervical region: Secondary | ICD-10-CM | POA: Diagnosis not present

## 2021-12-25 LAB — TSH: TSH: 3.96 u[IU]/mL (ref 0.450–4.500)

## 2022-02-10 ENCOUNTER — Other Ambulatory Visit: Payer: Self-pay | Admitting: Family

## 2022-02-10 DIAGNOSIS — M542 Cervicalgia: Secondary | ICD-10-CM

## 2022-02-17 ENCOUNTER — Encounter: Payer: Self-pay | Admitting: Family Medicine

## 2022-02-17 ENCOUNTER — Ambulatory Visit (INDEPENDENT_AMBULATORY_CARE_PROVIDER_SITE_OTHER): Payer: Medicare HMO | Admitting: Family Medicine

## 2022-02-17 VITALS — BP 127/62 | HR 60 | Temp 97.9°F | Ht 69.0 in | Wt 222.4 lb

## 2022-02-17 DIAGNOSIS — J4541 Moderate persistent asthma with (acute) exacerbation: Secondary | ICD-10-CM | POA: Diagnosis not present

## 2022-02-17 DIAGNOSIS — J069 Acute upper respiratory infection, unspecified: Secondary | ICD-10-CM | POA: Diagnosis not present

## 2022-02-17 MED ORDER — AZITHROMYCIN 250 MG PO TABS: ORAL_TABLET | ORAL | 0 refills | Status: DC

## 2022-02-17 MED ORDER — PREDNISONE 20 MG PO TABS: 40.0000 mg | ORAL_TABLET | Freq: Every day | ORAL | 0 refills | Status: AC

## 2022-02-17 MED ORDER — METHYLPREDNISOLONE ACETATE 40 MG/ML IJ SUSP
40.0000 mg | Freq: Once | INTRAMUSCULAR | Status: AC
  Administered 2022-02-17: 40 mg via INTRAMUSCULAR

## 2022-02-17 NOTE — Progress Notes (Signed)
Acute Office Visit  Subjective:     Patient ID: Ronald Berger, male    DOB: January 02, 1946, 77 y.o.   MRN: [id number]  Chief Complaint  Patient presents with   Cough   Nasal Congestion    Cough This is a new problem. Episode onset: 4 days ago. The problem has been unchanged. The problem occurs constantly. The cough is Productive of sputum (white, green at times). Associated symptoms include nasal congestion, postnasal drip, a sore throat (now resolved), shortness of breath (mild, with activity) and wheezing. Pertinent negatives include no chest pain, chills, ear congestion, ear pain, fever or headaches. He has tried a beta-agonist inhaler, steroid inhaler and leukotriene antagonists (cloricidin, flonase) for the symptoms. The treatment provided mild relief. His past medical history is significant for asthma, bronchitis, environmental allergies and pneumonia. There is no history of COPD or emphysema.  He had a negative home Covid test. He has been having to use his albuterol inhaler 3-4x a day.   Review of Systems  Constitutional:  Negative for chills and fever.  HENT:  Positive for postnasal drip and sore throat (now resolved). Negative for ear pain.   Respiratory:  Positive for cough, shortness of breath (mild, with activity) and wheezing.   Cardiovascular:  Negative for chest pain.  Neurological:  Negative for headaches.  Endo/Heme/Allergies:  Positive for environmental allergies.        Objective:    BP 127/62 Comment: at home reading per pt  Pulse 60   Temp 97.9 F (36.6 C) (Temporal)   Ht '5\' 9"'$  (1.753 m)   Wt 222 lb 6 oz (100.9 kg)   SpO2 97%   BMI 32.84 kg/m    Physical Exam Vitals and nursing note reviewed.  Constitutional:      General: He is not in acute distress.    Appearance: He is not ill-appearing, toxic-appearing or diaphoretic.  HENT:     Nose: Congestion present.     Mouth/Throat:     Mouth: Mucous membranes are moist.     Pharynx: Oropharynx is  clear. No oropharyngeal exudate or posterior oropharyngeal erythema.  Eyes:     Conjunctiva/sclera: Conjunctivae normal.  Cardiovascular:     Rate and Rhythm: Normal rate and regular rhythm.     Heart sounds: Normal heart sounds. No murmur heard. Pulmonary:     Effort: Pulmonary effort is normal. No accessory muscle usage.     Breath sounds: Examination of the right-lower field reveals wheezing. Examination of the left-lower field reveals wheezing. Wheezing present. No rhonchi or rales.  Musculoskeletal:     Cervical back: Neck supple. No rigidity.     Right lower leg: No edema.     Left lower leg: No edema.  Lymphadenopathy:     Cervical: No cervical adenopathy.  Skin:    General: Skin is warm and dry.  Neurological:     General: No focal deficit present.     Mental Status: He is alert and oriented to person, place, and time.  Psychiatric:        Mood and Affect: Mood normal.        Behavior: Behavior normal.     No results found for any visits on 02/17/22.      Assessment & Plan:   Ananth was seen today for cough and nasal congestion.  Diagnoses and all orders for this visit:  Moderate persistent asthma with exacerbation Steroid IM injection today in the office. Start prednisone burst tomorrow. Continue advair, singular,  and albuterol prn.  -     predniSONE (DELTASONE) 20 MG tablet; Take 2 tablets (40 mg total) by mouth daily for 5 days. -     methylPREDNISolone acetate (DEPO-MEDROL) injection 40 mg  URI, acute Continue flonase, cloricidin. If symtpoms worsen or do not improve, start zpak as below.  -     azithromycin (ZITHROMAX Z-PAK) 250 MG tablet; As directed  Return to office for new or worsening symptoms, or if symptoms persist.   The patient indicates understanding of these issues and agrees with the plan.  Gwenlyn Perking, FNP

## 2022-02-24 ENCOUNTER — Telehealth: Payer: Self-pay | Admitting: Family

## 2022-02-24 DIAGNOSIS — J069 Acute upper respiratory infection, unspecified: Secondary | ICD-10-CM

## 2022-02-24 MED ORDER — AMOXICILLIN-POT CLAVULANATE 875-125 MG PO TABS: 1.0000 | ORAL_TABLET | Freq: Two times a day (BID) | ORAL | 0 refills | Status: AC

## 2022-02-24 NOTE — Telephone Encounter (Signed)
Augmentin sent in.  

## 2022-02-28 ENCOUNTER — Other Ambulatory Visit: Payer: Self-pay | Admitting: Family Medicine

## 2022-02-28 DIAGNOSIS — U071 COVID-19: Secondary | ICD-10-CM

## 2022-04-07 ENCOUNTER — Other Ambulatory Visit: Payer: Self-pay | Admitting: Family Medicine

## 2022-04-07 ENCOUNTER — Ambulatory Visit (INDEPENDENT_AMBULATORY_CARE_PROVIDER_SITE_OTHER): Payer: Medicare HMO

## 2022-04-07 VITALS — Ht 69.0 in | Wt 226.0 lb

## 2022-04-07 DIAGNOSIS — I2510 Atherosclerotic heart disease of native coronary artery without angina pectoris: Secondary | ICD-10-CM

## 2022-04-07 DIAGNOSIS — E785 Hyperlipidemia, unspecified: Secondary | ICD-10-CM

## 2022-04-07 DIAGNOSIS — Z1211 Encounter for screening for malignant neoplasm of colon: Secondary | ICD-10-CM

## 2022-04-07 DIAGNOSIS — Z0000 Encounter for general adult medical examination without abnormal findings: Secondary | ICD-10-CM

## 2022-04-07 DIAGNOSIS — F5101 Primary insomnia: Secondary | ICD-10-CM

## 2022-04-07 DIAGNOSIS — I252 Old myocardial infarction: Secondary | ICD-10-CM

## 2022-04-07 NOTE — Progress Notes (Signed)
Subjective:   Ronald Berger is a 77 y.o. male who presents for Medicare Annual/Subsequent preventive examination. I connected with  Ronald Berger on 04/07/22 by a audio enabled telemedicine application and verified that I am speaking with the correct person using two identifiers.  Patient Location: Home  Provider Location: Home Office  I discussed the limitations of evaluation and management by telemedicine. The patient expressed understanding and agreed to proceed.  Review of Systems     Cardiac Risk Factors include: advanced age (>83men, >4 women);male gender     Objective:    Today's Vitals   04/07/22 1039  Weight: 226 lb (102.5 kg)  Height: 5\' 9"  (1.753 m)   Body mass index is 33.37 kg/m.     04/07/2022   10:41 AM 05/29/2021    1:06 PM 04/03/2021   11:13 AM 04/02/2020   11:03 AM 02/21/2020   10:00 PM 04/01/2019   10:40 AM 04/01/2019   10:35 AM  Advanced Directives  Does Patient Have a Medical Advance Directive? Yes No No Yes No Yes No  Type of Paramedic of [place];Living will   Living will  [place];Living will   Does patient want to make changes to medical advance directive?    No - Patient declined  No - Patient declined   Copy of [place] in Chart? No - copy requested     No - copy requested   Would patient like information on creating a medical advance directive?  No - Patient declined Yes (MAU/Ambulatory/Procedural Areas - Information given)  No - Patient declined No - Patient declined Yes (MAU/Ambulatory/Procedural Areas - Information given)    Current Medications (verified) Outpatient Encounter Medications as of 04/07/2022  Medication Sig   albuterol (VENTOLIN HFA) 108 (90 Base) MCG/ACT inhaler INHALE 2 PUFFS INTO THE LUNGS EVERY 6  HOURS AS NEEDED FOR WHEEZING OR SHORTNESS OF BREATH.   alfuzosin (UROXATRAL) 10 MG 24 hr tablet Take 1 tablet (10 mg total) by mouth daily with breakfast. Please  stop Silodosin 8mg  and start taking Alfuzosin   aspirin 81 MG chewable tablet Chew 1 tablet (81 mg total) by mouth daily.   atorvastatin (LIPITOR) 40 MG tablet TAKE 1 TABLET (40 MG TOTAL) BY MOUTH DAILY.   azithromycin (ZITHROMAX Z-PAK) 250 MG tablet As directed   diclofenac (VOLTAREN) 75 MG EC tablet Take 1 tablet (75 mg total) by mouth 2 (two) times daily.   EPINEPHRINE 0.3 mg/0.3 mL IJ SOAJ injection INJECT 0.3 MG INTO THE MUSCLE AS NEEDED FOR ANAPHYLAXIS.   fluticasone (FLONASE) 50 MCG/ACT nasal spray USE 2 SPRAYS IN EACH NOSTRIL EVERY DAY   fluticasone-salmeterol (ADVAIR) 250-50 MCG/ACT AEPB Inhale 1 puff into the lungs in the morning and at bedtime.   furosemide (LASIX) 20 MG tablet Take 1 tablet (20 mg total) by mouth daily as needed (swelling). For swelling   levocetirizine (XYZAL) 5 MG tablet TAKE 1 TABLET EVERY EVENING   methocarbamol (ROBAXIN) 500 MG tablet TAKE 1 TABLET EVERY 8 HOURS AS NEEDED FOR MUSCLE SPASM(S)   metoprolol tartrate (LOPRESSOR) 25 MG tablet Take 0.5 tablets (12.5 mg total) by mouth 2 (two) times daily.   montelukast (SINGULAIR) 10 MG tablet TAKE 1 TABLET AT BEDTIME   Multiple Vitamins-Minerals (EMERGEN-C BLUE) PACK Take 1 each by mouth daily.   nitroGLYCERIN (NITROSTAT) 0.4 MG SL tablet PLACE 1 TABLET (0.4 MG TOTAL) UNDER THE TONGUE EVERY 5 (FIVE) MINUTES AS NEEDED FOR CHEST PAIN.  omeprazole (PRILOSEC) 20 MG capsule TAKE 1 CAPSULE EVERY EVENING   traZODone (DESYREL) 50 MG tablet TAKE 1 TABLET (50 MG TOTAL) BY MOUTH AT BEDTIME.   [DISCONTINUED] atorvastatin (LIPITOR) 40 MG tablet TAKE 1 TABLET EVERY DAY   [DISCONTINUED] traZODone (DESYREL) 50 MG tablet Take 1 tablet (50 mg total) by mouth at bedtime.   No facility-administered encounter medications on file as of 04/07/2022.    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 NSTEMI 07/2013 - DES to mLCx, DES to prox OM1, DES to mLAD with nonobstructive RCA stenosis, EF 55-65%.   Chronic kidney disease    kidney stones    Dyslipidemia    Family history of bone cancer    GERD (gastroesophageal reflux disease)    History of kidney stones    History of non-ST elevation myocardial infarction (NSTEMI)    07/ 2015  S/P  DES X3   History of palpitations    Hyperlipidemia    Hypertension    Insomnia 12/08/2018   x many years   Myocardial infarction ([place]) 2015   NSTEMI- 3 stents    OSA (obstructive sleep apnea)    cpap intolerant   Pneumonia due to COVID-19 virus    Right knee meniscal tear    S/P drug eluting coronary stent placement    07/ 2015  x3  to mLCFX, OM1, mLAD   Sinus bradycardia    Sleep apnea    no cpap    Past Surgical History:  Procedure Laterality Date   CATARACT EXTRACTION, BILATERAL     COLONOSCOPY     >10 yrs ago-polyps per pt- we have no report    EXTRACORPOREAL SHOCK WAVE LITHOTRIPSY  x2 last one 2005   EXTRACORPOREAL SHOCK WAVE LITHOTRIPSY Right 05/28/2021   Procedure: EXTRACORPOREAL SHOCK WAVE LITHOTRIPSY (ESWL);  Surgeon: Primus Bravo., MD;  Location: [facility];  Service: Urology;  Laterality: Right;   KNEE ARTHROSCOPY Right 05/02/2015   Procedure: RIGHT KNEE ARTHROSCOPY WITH medial meniscal DEBRIDEMENT ;  Surgeon: Gaynelle Arabian, MD;  Location[facility];  Service: Orthopedics;  Laterality: Right;   LEFT HEART CATHETERIZATION WITH CORONARY ANGIOGRAM N/A 08/05/2013   Procedure: LEFT HEART CATHETERIZATION WITH CORONARY ANGIOGRAM;  Surgeon: Blane Ohara, MD;  Location: [facility];  Service: Cardiovascular;  Laterality: N/A;  Promus DES's to  mLCFx,  OM1,  mLAD (total 3)/   mRCA 30%,  dLM  20-30%,  preserved  LVSF, ef 55-65%   LITHOTRIPSY     LUMBAR SPINE SURGERY  x3  last one 1980's   SHOULDER ARTHROSCOPY Right 2013   TONSILLECTOMY     Family History  Problem Relation Age of Onset   Coronary artery disease Mother        CABG   CAD Sister    Bone cancer Sister    Alzheimer's disease Father    Aneurysm Brother        brain   Heart failure Sister    Bone cancer Sister    Colon cancer Neg Hx    Colon polyps Neg Hx    Esophageal cancer Neg Hx  Rectal cancer Neg Hx    Stomach cancer Neg Hx    Social History   Socioeconomic History   Marital status: Married    Spouse name: Joelene Millin   Number of children: 1   Years of education: 12   Highest education level: Some college, no degree  Occupational History   Occupation: [employer]    Comment: Retired - prior Oceanographer  Tobacco Use   Smoking status: Former    Years: 1    Types: Cigarettes    Quit date: 04/27/1966    Years since quitting: 55.9   Smokeless tobacco: Never  Vaping Use   Vaping Use: Never used  Substance and Sexual Activity   Alcohol use: Yes    Comment: occasional   Drug use: No   Sexual activity: Yes  Other Topics Concern   Not on file  Social History Narrative   Son lives with them   One level home   Social Determinants of Health   Financial Resource Strain: Low Risk  (04/07/2022)   Overall Financial Resource Strain (CARDIA)    Difficulty of Paying Living Expenses: Not hard at all  Food Insecurity: No Food Insecurity (04/07/2022)   Hunger Vital Sign    Worried About Running Out of Food in the Last Year: Never true    [place] in the Last Year: Never true  Transportation Needs: No Transportation Needs (04/07/2022)   PRAPARE - Hydrologist (Medical): No    Lack of Transportation (Non-Medical): No  Physical Activity: Sufficiently Active (04/07/2022)   Exercise Vital Sign    Days of Exercise per Week: 5 days    Minutes of Exercise per Session: 60 min   Stress: No Stress Concern Present (04/07/2022)   [place]    Feeling of Stress : Not at all  Social Connections: Moderately Isolated (04/07/2022)   Social Connection and Isolation Panel [NHANES]    Frequency of Communication with Friends and Family: More than three times a week    Frequency of Social Gatherings with Friends and Family: More than three times a week    Attends Religious Services: Never    Marine scientist or Organizations: No    Attends Music therapist: Never    Marital Status: Married    Tobacco Counseling Counseling given: Not Answered   Clinical Intake:  Pre-visit preparation completed: Yes  Pain : No/denies pain     Nutritional Risks: None Diabetes: No  How often do you need to have someone help you when you read instructions, pamphlets, or other written materials from your doctor or pharmacy?: 1 - Never  Diabetic?no   Interpreter Needed?: No  Information entered by :: Jadene Pierini, LPN   Activities of Daily Living    04/07/2022   10:41 AM 05/27/2021   11:25 AM  In your present state of health, do you have any difficulty performing the following activities:  Hearing? 0 0  Vision? 0 0  Difficulty concentrating or making decisions? 0 0  Walking or climbing stairs? 0 0  Dressing or bathing? 0 0  Doing errands, shopping? 0   Preparing Food and eating ? N   Using the Toilet? N   In the past six months, have you accidently leaked urine? N   Do you have problems with loss of bowel control? N   Managing your Medications? N   Managing your Finances? N  Housekeeping or managing your Housekeeping? N     Patient Care Team: Sharion Balloon, FNP as PCP - General (Family Medicine) Debara Pickett Nadean Corwin, MD as PCP - Cardiology (Cardiology) Alyson Ingles Candee Furbish, MD as Consulting Physician (Urology) Harlen Labs, MD as Referring Physician (Optometry) Ernst Bowler Gwenith Daily, MD as Consulting Physician (Allergy and Immunology) Sharion Balloon, FNP as Nurse Practitioner (Family Medicine)  Indicate any recent Medical Services you may have received from other than [facility] providers in the past year (date may be approximate).     Assessment:   This is a routine wellness examination for Ronald Berger.  Hearing/Vision screen Vision Screening - Comments:: Wears rx glasses - up to date with routine eye exams with  Dr.Lee   Dietary issues and exercise activities discussed: Current Exercise Habits: Home exercise routine, Type of exercise: walking, Time (Minutes): 30, Frequency (Times/Week): 3, Weekly Exercise (Minutes/Week): 90, Intensity: Mild, Exercise limited by: None identified   Goals Addressed             This Visit's Progress    AWV   On track    04/02/2020 AWV Goal: Fall Prevention  Over the next year, patient will decrease their risk for falls by: Using assistive devices, such as a cane or walker, as needed Identifying fall risks within their home and correcting them by: Removing throw rugs Adding handrails to stairs or ramps Removing clutter and keeping a clear pathway throughout the home Increasing light, especially at night Adding shower handles/bars Raising toilet seat Identifying potential personal risk factors for falls: Medication side effects Incontinence/urgency Vestibular dysfunction Hearing loss Musculoskeletal disorders Neurological disorders Orthostatic hypotension         Depression Screen    04/07/2022   10:40 AM 02/17/2022    3:02 PM 12/24/2021    3:56 PM 10/22/2021    8:05 AM 09/05/2021   11:35 AM 07/17/2021    4:17 PM 05/30/2021    1:46 PM  PHQ 2/9 Scores  PHQ - 2 Score 0 0 0 0 0 0 0  PHQ- 9 Score 0 0 0   0 1    Fall Risk    04/07/2022   10:39 AM 02/17/2022    3:02 PM 12/24/2021    3:56 PM 10/22/2021    8:05 AM 09/05/2021   11:35 AM  [place] in the past year? 0 0 0 0 0  Number falls in past yr: 0       Injury with Fall? 0      Risk for fall due to : No Fall Risks      Follow up Falls prevention discussed        [place]:  Any stairs in or around the home? No  If so, are there any without handrails? No  Home free of loose throw rugs in walkways, pet beds, electrical cords, etc? Yes  Adequate lighting in your home to reduce risk of falls? Yes   ASSISTIVE DEVICES UTILIZED TO PREVENT FALLS:  Life alert? No  Use of a cane, walker or w/c? No  Grab bars in the bathroom? No  Shower chair or bench in shower? No  Elevated toilet seat or a handicapped toilet? No          04/07/2022   10:42 AM 04/02/2020   11:06 AM 04/01/2019   10:36 AM  6CIT Screen  What Year? 0 points 0 points 0 points  What month? 0 points  0 points 0 points  What time? 0 points 0 points 0 points  Count back from 20 0 points 0 points 0 points  Months in reverse 0 points 0 points 0 points  Repeat phrase 0 points 0 points 0 points  Total Score 0 points 0 points 0 points    Immunizations Immunization History  Administered Date(s) Administered   Fluad Quad(high Dose 65+) 12/03/2018, 10/26/2019, 12/04/2020, 10/22/2021   Influenza Inj Mdck Quad Pf 12/03/2018   Influenza,inj,Quad PF,6+ Mos 11/10/2016, 12/01/2017   Influenza-Unspecified 11/27/2015   Moderna Covid-19 Vaccine Bivalent Booster 78yrs & up 12/04/2020   Moderna Sars-Covid-2 Vaccination 03/28/2019, 04/25/2019, 05/01/2020   Pneumococcal Conjugate-13 02/23/2015   Pneumococcal Polysaccharide-23 08/06/2013   Tdap 06/04/2019   Zoster Recombinat (Shingrix) 04/16/2021, 06/12/2021    TDAP status: Up to date  Flu Vaccine status: Up to date  Pneumococcal vaccine status: Up to date  Covid-19 vaccine status: Completed vaccines  Qualifies for Shingles Vaccine? Yes   Zostavax completed Yes   Shingrix Completed?: Yes  Screening Tests Health Maintenance  Topic Date Due   COVID-19 Vaccine (5 - 2023-24 season) 09/20/2021    COLONOSCOPY (Pts 45-70yrs Insurance coverage will need to be confirmed)  10/23/2022 (Originally 02/10/2020)   Medicare Annual Wellness (AWV)  04/07/2023   DTaP/Tdap/Td (2 - Td or Tdap) 06/03/2029   Pneumonia Vaccine 46+ Years old  Completed   INFLUENZA VACCINE  Completed   Hepatitis C Screening  Completed   Zoster Vaccines- Shingrix  Completed   HPV VACCINES  Aged Out    Health Maintenance  Health Maintenance Due  Topic Date Due   COVID-19 Vaccine (5 - 2023-24 season) 09/20/2021    Colorectal cancer screening: Referral to GI placed 04/07/2022. Pt aware the office will call re: appt.  Lung Cancer Screening: (Low Dose CT Chest recommended if Age 37-80 years, 30 pack-year currently smoking OR have quit w/in 15years.) does not qualify.   Lung Cancer Screening Referral: n/a  Additional Screening:  Hepatitis C Screening: does not qualify;   Vision Screening: Recommended annual ophthalmology exams for early detection of glaucoma and other disorders of the eye. Is the patient up to date with their annual eye exam?  Yes  Who is the provider or what is the name of the office in which the patient attends annual eye exams? Dr.Lee  If pt is not established with a provider, would they like to be referred to a provider to establish care? No .   Dental Screening: Recommended annual dental exams for proper oral hygiene  Community Resource Referral / Chronic Care Management: CRR required this visit?  No   CCM required this visit?  No      Plan:     I have personally reviewed and noted the following in the patient's chart:   Medical and social history Use of alcohol, tobacco or illicit drugs  Current medications and supplements including opioid prescriptions. Patient is not currently taking opioid prescriptions. Functional ability and status Nutritional status Physical activity Advanced directives List of other physicians Hospitalizations, surgeries, and ER visits in previous 12  months Vitals Screenings to include cognitive, depression, and falls Referrals and appointments  In addition, I have reviewed and discussed with patient certain preventive protocols, quality metrics, and best practice recommendations. A written personalized care plan for preventive services as well as general preventive health recommendations were provided to patient.     Daphane Shepherd, LPN   [id number]   Nurse Notes: none

## 2022-04-07 NOTE — Patient Instructions (Signed)
Ronald Berger , Thank you for taking time to come for your Medicare Wellness Visit. I appreciate your ongoing commitment to your health goals. Please review the following plan we discussed and let me know if I can assist you in the future.   These are the goals we discussed:  Goals      AWV     04/02/2020 AWV Goal: Fall Prevention  Over the next year, patient will decrease their risk for falls by: Using assistive devices, such as a cane or walker, as needed Identifying fall risks within their home and correcting them by: Removing throw rugs Adding handrails to stairs or ramps Removing clutter and keeping a clear pathway throughout the home Increasing light, especially at night Adding shower handles/bars Raising toilet seat Identifying potential personal risk factors for falls: Medication side effects Incontinence/urgency Vestibular dysfunction Hearing loss Musculoskeletal disorders Neurological disorders Orthostatic hypotension       Exercise 3x per week (30 min per time)     Try to exercise for at least 30 minutes, 3 times weekly        This is a list of the screening recommended for you and due dates:  Health Maintenance  Topic Date Due   COVID-19 Vaccine (5 - 2023-24 season) 09/20/2021   Colon Cancer Screening  10/23/2022*   Medicare Annual Wellness Visit  04/07/2023   DTaP/Tdap/Td vaccine (2 - Td or Tdap) 06/03/2029   Pneumonia Vaccine  Completed   Flu Shot  Completed   Hepatitis C Screening: USPSTF Recommendation to screen - Ages 30-79 yo.  Completed   Zoster (Shingles) Vaccine  Completed   HPV Vaccine  Aged Out  *Topic was postponed. The date shown is not the original due date.    Advanced directives: Advance directive discussed with you today. I have provided a copy for you to complete at home and have notarized. Once this is complete please bring a copy in to our office so we can scan it into your chart.   Conditions/risks identified: Aim for 30 minutes of  exercise or brisk walking, 6-8 glasses of water, and 5 servings of fruits and vegetables each day.   Next appointment: Follow up in one year for your annual wellness visit.   Preventive Care 31 Years and Older, Male  Preventive care refers to lifestyle choices and visits with your health care provider that can promote health and wellness. What does preventive care include? A yearly physical exam. This is also called an annual well check. Dental exams once or twice a year. Routine eye exams. Ask your health care provider how often you should have your eyes checked. Personal lifestyle choices, including: Daily care of your teeth and gums. Regular physical activity. Eating a healthy diet. Avoiding tobacco and drug use. Limiting alcohol use. Practicing safe sex. Taking low doses of aspirin every day. Taking vitamin and mineral supplements as recommended by your health care provider. What happens during an annual well check? The services and screenings done by your health care provider during your annual well check will depend on your age, overall health, lifestyle risk factors, and family history of disease. Counseling  Your health care provider may ask you questions about your: Alcohol use. Tobacco use. Drug use. Emotional well-being. Home and relationship well-being. Sexual activity. Eating habits. History of falls. Memory and ability to understand (cognition). Work and work Statistician. Screening  You may have the following tests or measurements: Height, weight, and BMI. Blood pressure. Lipid and cholesterol levels. These may  be checked every 5 years, or more frequently if you are over 49 years old. Skin check. Lung cancer screening. You may have this screening every year starting at age 2 if you have a 30-pack-year history of smoking and currently smoke or have quit within the past 15 years. Fecal occult blood test (FOBT) of the stool. You may have this test every year  starting at age 37. Flexible sigmoidoscopy or colonoscopy. You may have a sigmoidoscopy every 5 years or a colonoscopy every 10 years starting at age 31. Prostate cancer screening. Recommendations will vary depending on your family history and other risks. Hepatitis C blood test. Hepatitis B blood test. Sexually transmitted disease (STD) testing. Diabetes screening. This is done by checking your blood sugar (glucose) after you have not eaten for a while (fasting). You may have this done every 1-3 years. Abdominal aortic aneurysm (AAA) screening. You may need this if you are a current or former smoker. Osteoporosis. You may be screened starting at age 27 if you are at high risk. Talk with your health care provider about your test results, treatment options, and if necessary, the need for more tests. Vaccines  Your health care provider may recommend certain vaccines, such as: Influenza vaccine. This is recommended every year. Tetanus, diphtheria, and acellular pertussis (Tdap, Td) vaccine. You may need a Td booster every 10 years. Zoster vaccine. You may need this after age 22. Pneumococcal 13-valent conjugate (PCV13) vaccine. One dose is recommended after age 69. Pneumococcal polysaccharide (PPSV23) vaccine. One dose is recommended after age 16. Talk to your health care provider about which screenings and vaccines you need and how often you need them. This information is not intended to replace advice given to you by your health care provider. Make sure you discuss any questions you have with your health care provider. Document Released: 02/02/2015 Document Revised: 09/26/2015 Document Reviewed: 11/07/2014 Elsevier Interactive Patient Education  2017 [place] Prevention in the Home Falls can cause injuries. They can happen to people of all ages. There are many things you can do to make your home safe and to help prevent falls. What can I do on the outside of my home? Regularly fix  the edges of walkways and driveways and fix any cracks. Remove anything that might make you trip as you walk through a door, such as a raised step or threshold. Trim any bushes or trees on the path to your home. Use bright outdoor lighting. Clear any walking paths of anything that might make someone trip, such as rocks or tools. Regularly check to see if handrails are loose or broken. Make sure that both sides of any steps have handrails. Any raised decks and porches should have guardrails on the edges. Have any leaves, snow, or ice cleared regularly. Use sand or salt on walking paths during winter. Clean up any spills in your garage right away. This includes oil or grease spills. What can I do in the bathroom? Use night lights. Install grab bars by the toilet and in the tub and shower. Do not use towel bars as grab bars. Use non-skid mats or decals in the tub or shower. If you need to sit down in the shower, use a plastic, non-slip stool. Keep the floor dry. Clean up any water that spills on the floor as soon as it happens. Remove soap buildup in the tub or shower regularly. Attach bath mats securely with double-sided non-slip rug tape. Do not have throw rugs and  other things on the floor that can make you trip. What can I do in the bedroom? Use night lights. Make sure that you have a light by your bed that is easy to reach. Do not use any sheets or blankets that are too big for your bed. They should not hang down onto the floor. Have a firm chair that has side arms. You can use this for support while you get dressed. Do not have throw rugs and other things on the floor that can make you trip. What can I do in the kitchen? Clean up any spills right away. Avoid walking on wet floors. Keep items that you use a lot in easy-to-reach places. If you need to reach something above you, use a strong step stool that has a grab bar. Keep electrical cords out of the way. Do not use floor polish  or wax that makes floors slippery. If you must use wax, use non-skid floor wax. Do not have throw rugs and other things on the floor that can make you trip. What can I do with my stairs? Do not leave any items on the stairs. Make sure that there are handrails on both sides of the stairs and use them. Fix handrails that are broken or loose. Make sure that handrails are as long as the stairways. Check any carpeting to make sure that it is firmly attached to the stairs. Fix any carpet that is loose or worn. Avoid having throw rugs at the top or bottom of the stairs. If you do have throw rugs, attach them to the floor with carpet tape. Make sure that you have a light switch at the top of the stairs and the bottom of the stairs. If you do not have them, ask someone to add them for you. What else can I do to help prevent falls? Wear shoes that: Do not have high heels. Have rubber bottoms. Are comfortable and fit you well. Are closed at the toe. Do not wear sandals. If you use a stepladder: Make sure that it is fully opened. Do not climb a closed stepladder. Make sure that both sides of the stepladder are locked into place. Ask someone to hold it for you, if possible. Clearly mark and make sure that you can see: Any grab bars or handrails. First and last steps. Where the edge of each step is. Use tools that help you move around (mobility aids) if they are needed. These include: Canes. Walkers. Scooters. Crutches. Turn on the lights when you go into a dark area. Replace any light bulbs as soon as they burn out. Set up your furniture so you have a clear path. Avoid moving your furniture around. If any of your floors are uneven, fix them. If there are any pets around you, be aware of where they are. Review your medicines with your doctor. Some medicines can make you feel dizzy. This can increase your chance of falling. Ask your doctor what other things that you can do to help prevent  falls. This information is not intended to replace advice given to you by your health care provider. Make sure you discuss any questions you have with your health care provider. Document Released: 11/02/2008 Document Revised: 06/14/2015 Document Reviewed: 02/10/2014 Elsevier Interactive Patient Education  2017 [employer].

## 2022-04-08 ENCOUNTER — Encounter: Payer: Self-pay | Admitting: Internal Medicine

## 2022-04-09 ENCOUNTER — Ambulatory Visit: Payer: Medicare HMO | Admitting: Urology

## 2022-04-09 ENCOUNTER — Ambulatory Visit (HOSPITAL_COMMUNITY)
Admission: RE | Admit: 2022-04-09 | Discharge: 2022-04-09 | Disposition: A | Discharge status: Home / Self Care | Payer: Medicare HMO | Source: Ambulatory Visit | Attending: Urology | Admitting: Urology

## 2022-04-09 VITALS — BP 139/55 | HR 69

## 2022-04-09 DIAGNOSIS — N138 Other obstructive and reflux uropathy: Secondary | ICD-10-CM

## 2022-04-09 DIAGNOSIS — R93422 Abnormal radiologic findings on diagnostic imaging of left kidney: Secondary | ICD-10-CM

## 2022-04-09 DIAGNOSIS — N401 Enlarged prostate with lower urinary tract symptoms: Secondary | ICD-10-CM | POA: Diagnosis not present

## 2022-04-09 DIAGNOSIS — R351 Nocturia: Secondary | ICD-10-CM

## 2022-04-09 DIAGNOSIS — N200 Calculus of kidney: Secondary | ICD-10-CM | POA: Diagnosis not present

## 2022-04-09 MED ORDER — ALFUZOSIN HCL ER 10 MG PO TB24: 10.0000 mg | ORAL_TABLET | Freq: Every day | ORAL | 3 refills | Status: DC

## 2022-04-09 NOTE — Progress Notes (Unsigned)
04/09/2022 4:26 PM   Ronald Berger Jan 16, 1946 [id number]  Referring provider: [place], [place],  [place] [id number]  No chief complaint on file.   HPI:  IPSS 9 QOL 2 on uroxatral 10mg  daily. Nocturia 0-1x. No stone events from last visit. KUB shows possible 15mm left lower pole calculus.   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 NSTEMI 07/2013 - DES to mLCx, DES to prox OM1, DES to mLAD with nonobstructive RCA stenosis, EF 55-65%.   Chronic kidney disease    kidney stones    Dyslipidemia    Family history of bone cancer    GERD (gastroesophageal reflux disease)    History of kidney stones    History of non-ST elevation myocardial infarction (NSTEMI)    07/ 2015  S/P  DES X3   History of palpitations    Hyperlipidemia    Hypertension    Insomnia 12/08/2018   x many years   Myocardial infarction ([place]) 2015   NSTEMI- 3 stents    OSA (obstructive sleep apnea)    cpap intolerant   Pneumonia due to COVID-19 virus    Right knee meniscal tear    S/P drug eluting coronary stent placement    07/ 2015  x3  to mLCFX, OM1, mLAD   Sinus bradycardia    Sleep apnea    no cpap     Surgical History: Past Surgical History:  Procedure Laterality Date   CATARACT EXTRACTION, BILATERAL     COLONOSCOPY     >10 yrs ago-polyps per pt- we have no report    EXTRACORPOREAL SHOCK WAVE LITHOTRIPSY  x2 last one 2005   EXTRACORPOREAL SHOCK WAVE LITHOTRIPSY Right 05/28/2021   Procedure: EXTRACORPOREAL SHOCK WAVE LITHOTRIPSY (ESWL);  Surgeon: Primus Bravo., MD;  Location: [facility];  Service: Urology;  Laterality: Right;   KNEE ARTHROSCOPY Right 05/02/2015   Procedure: RIGHT KNEE ARTHROSCOPY WITH medial  meniscal DEBRIDEMENT ;  Surgeon: Gaynelle Arabian, MD;  Location: [facility];  Service: Orthopedics;  Laterality: Right;   LEFT HEART CATHETERIZATION WITH CORONARY ANGIOGRAM N/A 08/05/2013   Procedure: LEFT HEART CATHETERIZATION WITH CORONARY ANGIOGRAM;  Surgeon: Blane Ohara, MD;  Location: [facility] C[facility];  Service: Cardiovascular;  Laterality: N/A;  Promus DES's to  mLCFx,  OM1,  mLAD (total 3)/   mRCA 30%,  dLM  20-30%,  preserved LVSF, ef 55-65%   LITHOTRIPSY     LUMBAR SPINE SURGERY  x3  last one 1980's   SHOULDER ARTHROSCOPY Right 2013   TONSILLECTOMY      Home Medications:  Allergies as of 04/09/2022       Reactions   Bee Venom Shortness Of Breath   Oxycodone Nausea And Vomiting   Percocet [oxycodone-acetaminophen] Nausea And Vomiting        Medication List        Accurate as of April 09, 2022  4:26 PM. If you have any questions, ask your nurse or doctor.          albuterol 108 (90 Base) MCG/ACT inhaler Commonly known as: VENTOLIN HFA INHALE 2 PUFFS INTO THE LUNGS EVERY 6  HOURS AS NEEDED FOR  WHEEZING OR SHORTNESS OF BREATH.   alfuzosin 10 MG 24 hr tablet Commonly known as: UROXATRAL Take 1 tablet (10 mg total) by mouth daily with breakfast. Please stop Silodosin 8mg  and start taking Alfuzosin   aspirin 81 MG chewable tablet Chew 1 tablet (81 mg total) by mouth daily.   atorvastatin 40 MG tablet Commonly known as: LIPITOR TAKE 1 TABLET (40 MG TOTAL) BY MOUTH DAILY.   azithromycin 250 MG tablet Commonly known as: Zithromax Z-Pak As directed   diclofenac 75 MG EC tablet Commonly known as: VOLTAREN Take 1 tablet (75 mg total) by mouth 2 (two) times daily.   Emergen-C Blue Pack Take 1 each by mouth daily.   EPINEPHrine 0.3 mg/0.3 mL Soaj injection Commonly known as: EPI-PEN INJECT 0.3 MG INTO THE MUSCLE AS NEEDED FOR ANAPHYLAXIS.   fluticasone 50 MCG/ACT nasal spray Commonly known as: FLONASE USE 2 SPRAYS IN EACH NOSTRIL EVERY DAY    fluticasone-salmeterol 250-50 MCG/ACT Aepb Commonly known as: ADVAIR Inhale 1 puff into the lungs in the morning and at bedtime.   furosemide 20 MG tablet Commonly known as: LASIX Take 1 tablet (20 mg total) by mouth daily as needed (swelling). For swelling   levocetirizine 5 MG tablet Commonly known as: XYZAL TAKE 1 TABLET EVERY EVENING   methocarbamol 500 MG tablet Commonly known as: ROBAXIN TAKE 1 TABLET EVERY 8 HOURS AS NEEDED FOR MUSCLE SPASM(S)   metoprolol tartrate 25 MG tablet Commonly known as: LOPRESSOR Take 0.5 tablets (12.5 mg total) by mouth 2 (two) times daily.   montelukast 10 MG tablet Commonly known as: SINGULAIR TAKE 1 TABLET AT BEDTIME   nitroGLYCERIN 0.4 MG SL tablet Commonly known as: NITROSTAT PLACE 1 TABLET (0.4 MG TOTAL) UNDER THE TONGUE EVERY 5 (FIVE) MINUTES AS NEEDED FOR CHEST PAIN.   omeprazole 20 MG capsule Commonly known as: PRILOSEC TAKE 1 CAPSULE EVERY EVENING   traZODone 50 MG tablet Commonly known as: DESYREL TAKE 1 TABLET (50 MG TOTAL) BY MOUTH AT BEDTIME.        Allergies:  Allergies  Allergen Reactions   Bee Venom Shortness Of Breath   Oxycodone Nausea And Vomiting   Percocet [Oxycodone-Acetaminophen] Nausea And Vomiting    Family History: Family History  Problem Relation Age of Onset   Coronary artery disease Mother        CABG   CAD Sister    Bone cancer Sister    Alzheimer's disease Father    Aneurysm Brother        brain   Heart failure Sister    Bone cancer Sister    Colon cancer Neg Hx    Colon polyps Neg Hx    Esophageal cancer Neg Hx    Rectal cancer Neg Hx    Stomach cancer Neg Hx     Social History:  reports that he quit smoking about 55 years ago. His smoking use included cigarettes. He has never used smokeless tobacco. He reports current alcohol use. He reports that he does not use drugs.  ROS: All other review of systems were reviewed and are negative except what is noted above in HPI  Physical  Exam: BP (!) 139/55   Pulse 69   Constitutional:  Alert and oriented, No acute distress. HEENT: [place] AT, moist mucus membranes.  Trachea midline, no masses. Cardiovascular: No clubbing, cyanosis, or edema. Respiratory: Normal respiratory effort, no increased work of breathing. GI: Abdomen is soft, nontender, nondistended, no abdominal masses GU: No CVA tenderness.  Lymph:  No cervical or inguinal lymphadenopathy. Skin: No rashes, bruises or suspicious lesions. Neurologic: Grossly intact, no focal deficits, moving all 4 extremities. Psychiatric: Normal mood and affect.  Laboratory Data: Lab Results  Component Value Date   WBC 7.3 10/22/2021   HGB 14.6 10/22/2021   HCT 44.2 10/22/2021   MCV 87 10/22/2021   PLT 212 10/22/2021    Lab Results  Component Value Date   CREATININE 1.04 10/22/2021    No results found for: "PSA"  No results found for: "TESTOSTERONE"  Lab Results  Component Value Date   HGBA1C 5.9 (H) 10/22/2021    Urinalysis    Component Value Date/Time   COLORURINE YELLOW 02/20/2020 2218   APPEARANCEUR Clear 10/09/2021 1554   LABSPEC 1.023 02/20/2020 2218   PHURINE 5.0 02/20/2020 2218   GLUCOSEU Negative 10/09/2021 [place] 02/20/2020 2218   BILIRUBINUR Negative 10/09/2021 [place] 02/20/2020 2218   PROTEINUR Negative 10/09/2021 [place] 02/20/2020 2218   NITRITE Negative 10/09/2021 1554   NITRITE NEGATIVE 02/20/2020 2218   LEUKOCYTESUR Negative 10/09/2021 [place] 02/20/2020 2218    Lab Results  Component Value Date   LABMICR See below: 10/09/2021   WBCUA 0-5 10/09/2021   LABEPIT None seen 10/09/2021   MUCUS Present (A) 10/09/2021   BACTERIA None seen 10/09/2021    Pertinent Imaging: *** Results for orders placed in visit on 10/08/21  DG Abd 1 View  Narrative CLINICAL DATA:  Follow-up kidney stones.  EXAM: ABDOMEN - 1 VIEW  COMPARISON:  07/31/2021.  Abdomen and pelvis  CT dated 05/29/2021  FINDINGS: Normal bowel gas pattern. Stable lower pole left renal calculus measuring 8 mm. Additional abscess suggestion of additional small left renal calculi, most likely represent overlying stool content. Multiple additional small left renal calculi. Portions of the right kidney are obscured by colon with no visible right renal calculi. No ureteral calculi or bladder calculi seen. Mild thoracolumbar scoliosis. Thoracolumbar and lower lumbar spine degenerative changes.  IMPRESSION: Stable lower pole left renal calculus.   Electronically Signed By: Claudie Revering M.D. On: 10/11/2021 11:30  No results found for this or any previous visit.  No results found for this or any previous visit.  No results found for this or any previous visit.  Results for orders placed during the hospital encounter of 07/31/21  Ultrasound renal complete  Narrative CLINICAL DATA:  Nephrolithiasis.  EXAM: RENAL / URINARY TRACT ULTRASOUND COMPLETE  COMPARISON:  CT scan May 29, 2021  FINDINGS: Right Kidney:  Renal measurements: 12.3 x 5.2 x 5.8 cm = volume: 192 mL. Contains a 9 mm nonobstructive stone.  Left Kidney:  Renal measurements: 11.2 x 5.9 x 5.4 cm = volume: 187 mL. Contains a 12 mm nonobstructive stone. Contains a 4.8 cm cyst of no significance. No follow-up imaging recommended for the cyst.  Bladder:  Appears normal for degree of bladder distention.  Other:  None.  IMPRESSION: 1. Bilateral nonobstructive renal stones as above. 2. No other significant abnormalities.   Electronically Signed By: Dorise Bullion III M.D. On: 08/01/2021 14:10  No valid procedures specified. No results found for this or any previous visit.  Results for orders placed in visit on 05/15/21  CT RENAL STONE STUDY  Narrative CLINICAL DATA:  Bilateral lower abdominal pain x1 week, history of kidney stones.  EXAM: CT ABDOMEN AND PELVIS WITHOUT  CONTRAST  TECHNIQUE: Multidetector CT imaging of the abdomen and pelvis was performed following  the standard protocol without IV contrast.  RADIATION DOSE REDUCTION: This exam was performed according to the departmental dose-optimization program which includes automated exposure control, adjustment of the mA and/or kV according to patient size and/or use of iterative reconstruction technique.  COMPARISON:  Same day abdominal radiograph.  FINDINGS: Lower chest: Coronary artery calcifications. Borderline cardiac enlargement. Bibasilar scarring versus atelectasis.  Hepatobiliary: No suspicious hepatic lesion on this noncontrast examination. Gallbladder is unremarkable. No biliary ductal dilation.  Pancreas: No pancreatic ductal dilation or evidence of acute inflammation.  Spleen: No splenomegaly or focal splenic lesion.  Adrenals/Urinary Tract: Bilateral adrenal glands appear normal.  No hydronephrosis. Nonobstructive bilateral renal stones measuring up to 7 mm in the right lower pole and 4 mm in the left lower pole. No obstructive ureteral or bladder calculi identified.  Fluid density left lower pole renal lesion measures 2.8 x 2.1 x 4.9 cm is considered benign given the fluid attenuation of the lesion without evidence of complexity on noncontrast examination and most consistent with a single renal cyst or an adjacent renal sinus and cortical renal cysts.  Stomach/Bowel: No radiopaque enteric contrast material was administered. Stomach is unremarkable for degree of distension. No pathologic dilation of small or large bowel. The appendix and terminal ileum appear normal. No evidence of acute bowel inflammation.  Vascular/Lymphatic: Aortic and branch vessel atherosclerosis without abdominal aortic aneurysm. No pathologically enlarged abdominal or pelvic lymph nodes.  Reproductive: Prostate is unremarkable.  Other: No significant abdominopelvic free  fluid.  Musculoskeletal: Multilevel degenerative changes spine. 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  IMPRESSION: 1. Nonobstructive bilateral renal stones measuring up to 7 mm in the right lower pole and 4 mm in the left lower pole. No obstructive ureteral or bladder calculi identified. 2. 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 3. Aortic Atherosclerosis (ICD10-I70.0).   Electronically Signed By: Dahlia Bailiff M.D. On: 05/15/2021 15:31   Assessment & Plan:    1. Kidney stones Followup 1 year with KUB - Urinalysis, Routine w reflex microscopic  2. Benign prostatic hyperplasia with urinary obstruction ***  3. Nocturia ***   No follow-ups on file.  Nicolette Bang, MD  [facility] [place]

## 2022-04-10 ENCOUNTER — Encounter: Payer: Self-pay | Admitting: Urology

## 2022-04-10 LAB — MICROSCOPIC EXAMINATION: Bacteria, UA: NONE SEEN

## 2022-04-10 LAB — URINALYSIS, ROUTINE W REFLEX MICROSCOPIC
Bilirubin, UA: NEGATIVE
Glucose, UA: NEGATIVE
Ketones, UA: NEGATIVE
Leukocytes,UA: NEGATIVE
Nitrite, UA: NEGATIVE
Specific Gravity, UA: 1.02 (ref 1.005–1.030)
Urobilinogen, Ur: 1 mg/dL (ref 0.2–1.0)
pH, UA: 6 (ref 5.0–7.5)

## 2022-04-10 NOTE — Patient Instructions (Signed)

## 2022-04-24 ENCOUNTER — Other Ambulatory Visit: Payer: Self-pay | Admitting: Family

## 2022-04-24 ENCOUNTER — Ambulatory Visit (AMBULATORY_SURGERY_CENTER): Payer: Medicare HMO | Admitting: *Deleted

## 2022-04-24 VITALS — Ht 69.0 in | Wt 220.0 lb

## 2022-04-24 DIAGNOSIS — J4530 Mild persistent asthma, uncomplicated: Secondary | ICD-10-CM

## 2022-04-24 DIAGNOSIS — J301 Allergic rhinitis due to pollen: Secondary | ICD-10-CM

## 2022-04-24 DIAGNOSIS — Z86010 Personal history of colonic polyps: Secondary | ICD-10-CM

## 2022-04-24 NOTE — Progress Notes (Signed)
Pt's pre-visit is done over the phone and all paperwork (prep instructions) sent to patient. Pt's name and DOB verified at the beginning of the pre-visit. Pt denies any difficulty with ambulating.  No egg or soy allergy known to patient  No issues known to pt with past sedation with any surgeries or procedures Pt denies having issues being intubated Patient denies ever being intubated Pt has no issues moving head neck or swallowing No FH of Malignant Hyperthermia Pt is not on diet pills Pt is not on home 02  Pt is not on blood thinners  Pt denies issues with constipation  Pt is not on dialysis Pt denies any upcoming cardiac testing Pt encouraged to use to use Singlecare or Goodrx to reduce cost  Patient's chart reviewed by Osvaldo Angst CNRA prior to pre-visit and patient appropriate for the [place].  Pre-visit completed and red dot placed by patient's name on their procedure day (on provider's schedule).  . Visit by phone Pt states  weight is 220lb Instructions reviewed with pt and pt states understanding. Instructed to review again prior to procedure. Pt states they will.  Instructions sent by mail with coupon and by my chart

## 2022-04-28 ENCOUNTER — Encounter: Payer: Self-pay | Admitting: Internal Medicine

## 2022-04-30 ENCOUNTER — Encounter: Payer: Medicare HMO | Admitting: Gastroenterology

## 2022-05-07 ENCOUNTER — Other Ambulatory Visit: Payer: Self-pay | Admitting: Family Medicine

## 2022-05-07 DIAGNOSIS — R002 Palpitations: Secondary | ICD-10-CM

## 2022-05-22 ENCOUNTER — Ambulatory Visit (AMBULATORY_SURGERY_CENTER): Payer: Medicare HMO | Admitting: Internal Medicine

## 2022-05-22 ENCOUNTER — Encounter: Payer: Self-pay | Admitting: Internal Medicine

## 2022-05-22 VITALS — BP 126/84 | HR 53 | Temp 97.5°F | Resp 13 | Ht 69.0 in | Wt 220.0 lb

## 2022-05-22 DIAGNOSIS — Z86010 Personal history of colonic polyps: Secondary | ICD-10-CM

## 2022-05-22 DIAGNOSIS — Z09 Encounter for follow-up examination after completed treatment for conditions other than malignant neoplasm: Secondary | ICD-10-CM

## 2022-05-22 DIAGNOSIS — D122 Benign neoplasm of ascending colon: Secondary | ICD-10-CM | POA: Diagnosis not present

## 2022-05-22 DIAGNOSIS — I2510 Atherosclerotic heart disease of native coronary artery without angina pectoris: Secondary | ICD-10-CM | POA: Diagnosis not present

## 2022-05-22 DIAGNOSIS — I10 Essential (primary) hypertension: Secondary | ICD-10-CM | POA: Diagnosis not present

## 2022-05-22 DIAGNOSIS — G4733 Obstructive sleep apnea (adult) (pediatric): Secondary | ICD-10-CM | POA: Diagnosis not present

## 2022-05-22 DIAGNOSIS — D120 Benign neoplasm of cecum: Secondary | ICD-10-CM

## 2022-05-22 MED ORDER — SODIUM CHLORIDE 0.9 % IV SOLN: 500.0000 mL | INTRAVENOUS | Status: DC

## 2022-05-22 NOTE — Op Note (Signed)
[facility] Patient Name: Ronald Berger Procedure Date: 05/22/2022 10:55 AM MRN: [id number] Endoscopist: Iva Boop , MD, [id number] Age: 77 Referring MD:  Date of Birth: 31-Aug-1945 Gender: Male Account #: [id number] Procedure:                Colonoscopy Indications:              Surveillance: Personal history of adenomatous                            polyps on last colonoscopy 3 years ago, Last                            colonoscopy: 2021 Medicines:                Monitored Anesthesia Care Procedure:                Pre-Anesthesia Assessment:                           - Prior to the procedure, a History and Physical                            was performed, and patient medications and                            allergies were reviewed. The patient's tolerance of                            previous anesthesia was also reviewed. The risks                            and benefits of the procedure and the sedation                            options and risks were discussed with the patient.                            All questions were answered, and informed consent                            was obtained. Prior Anticoagulants: The patient has                            taken no anticoagulant or antiplatelet agents. ASA                            Grade Assessment: III - A patient with severe                            systemic disease. After reviewing the risks and                            benefits, the patient was deemed in satisfactory  condition to undergo the procedure.                           After obtaining informed consent, the colonoscope                            was passed under direct vision. Throughout the                            procedure, the patient's blood pressure, pulse, and                            oxygen saturations were monitored continuously. The                            Olympus CF-HQ190L SN [id number] was introduced  through                            the anus and advanced to the the cecum, identified                            by appendiceal orifice and ileocecal valve. The                            colonoscopy was performed without difficulty. The                            patient tolerated the procedure well. The quality                            of the bowel preparation was good. The ileocecal                            valve, appendiceal orifice, and rectum were                            photographed. The bowel preparation used was                            Miralax via split dose instruction. Scope In: 11:07:12 AM Scope Out: 11:21:10 AM Scope Withdrawal Time: 0 hours 12 minutes 32 seconds  Total Procedure Duration: 0 hours 13 minutes 58 seconds  Findings:                 The perianal and digital rectal examinations were                            normal.                           Four sessile polyps were found in the ascending                            colon and cecum. The polyps were diminutive in  size. These polyps were removed with a cold snare.                            Resection and retrieval were complete. Verification                            of patient identification for the specimen was                            done. Estimated blood loss was minimal.                           The exam was otherwise without abnormality on                            direct and retroflexion views. Complications:            No immediate complications. Estimated Blood Loss:     Estimated blood loss was minimal. Impression:               - Four diminutive polyps in the ascending colon and                            in the cecum, removed with a cold snare. Resected                            and retrieved.                           - The examination was otherwise normal on direct                            and retroflexion views.                           - Personal  history of colonic polyps. 10 adenomas                            2021 and prior polyps Recommendation:           - Patient has a contact number available for                            emergencies. The signs and symptoms of potential                            delayed complications were discussed with the                            patient. Return to normal activities tomorrow.                            Written discharge instructions were provided to the                            patient.                           -  Resume previous diet.                           - Continue present medications.                           - Repeat colonoscopy is recommended. The                            colonoscopy date will be determined after pathology                            results from today's exam become available for                            review. Iva Boop, MD 05/22/2022 11:26:10 AM This report has been signed electronically.

## 2022-05-22 NOTE — Progress Notes (Signed)
Called to room to assist during endoscopic procedure.  Patient ID and intended procedure confirmed with present staff. Received instructions for my participation in the procedure from the performing physician.  

## 2022-05-22 NOTE — Patient Instructions (Addendum)
4 tiny polyps removed this time. I will let you know pathology results and when/if to have another routine colonoscopy by mail and/or My Chart.  I appreciate the opportunity to care for you. Iva Boop, MD, FACG   YOU HAD AN ENDOSCOPIC PROCEDURE TODAY AT THE [facility]:   Refer to the procedure report that was given to you for any specific questions about what was found during the examination.  If the procedure report does not answer your questions, please call your gastroenterologist to clarify.  If you requested that your care partner not be given the details of your procedure findings, then the procedure report has been included in a sealed envelope for you to review at your convenience later.  YOU SHOULD EXPECT: Some feelings of bloating in the abdomen. Passage of more gas than usual.  Walking can help get rid of the air that was put into your GI tract during the procedure and reduce the bloating. If you had a lower endoscopy (such as a colonoscopy or flexible sigmoidoscopy) you may notice spotting of blood in your stool or on the toilet paper. If you underwent a bowel prep for your procedure, you may not have a normal bowel movement for a few days.  Please Note:  You might notice some irritation and congestion in your nose or some drainage.  This is from the oxygen used during your procedure.  There is no need for concern and it should clear up in a day or so.  SYMPTOMS TO REPORT IMMEDIATELY:  Following lower endoscopy (colonoscopy or flexible sigmoidoscopy):  Excessive amounts of blood in the stool  Significant tenderness or worsening of abdominal pains  Swelling of the abdomen that is new, acute  Fever of 100F or higher   For urgent or emergent issues, a gastroenterologist can be reached at any hour by calling [phone]. Do not use MyChart messaging for urgent concerns.    DIET:  We do recommend a small meal at first, but then you may proceed to your regular  diet.  Drink plenty of fluids but you should avoid alcoholic beverages for 24 hours.  ACTIVITY:  You should plan to take it easy for the rest of today and you should NOT DRIVE or use heavy machinery until tomorrow (because of the sedation medicines used during the test).    FOLLOW UP: Our staff will call the number listed on your records the next business day following your procedure.  We will call around 7:15- 8:00 am to check on you and address any questions or concerns that you may have regarding the information given to you following your procedure. If we do not reach you, we will leave a message.     If any biopsies were taken you will be contacted by phone or by letter within the next 1-3 weeks.  Please call us at [phone] if you have not heard about the biopsies in 3 weeks.    SIGNATURES/CONFIDENTIALITY: You and/or your care partner have signed paperwork which will be entered into your electronic medical record.  These signatures attest to the fact that that the information above on your After Visit Summary has been reviewed and is understood.  Full responsibility of the confidentiality of this discharge information lies with you and/or your care-partner.

## 2022-05-22 NOTE — Progress Notes (Signed)
[facility] History and Physical   Primary Care Physician:  Junie Spencer, FNP   Reason for Procedure:   Hx colon polyps  Plan:    colonoscopy     HPI: Ronald Berger is a 77 y.o. 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 NSTEMI 07/2013 - DES to mLCx, DES to prox OM1, DES to mLAD with nonobstructive RCA stenosis, EF 55-65%.   Cataract    Chronic kidney disease    kidney stones    Dyslipidemia    Family history of bone cancer    GERD (gastroesophageal reflux disease)    History of kidney stones    History of non-ST elevation myocardial infarction (NSTEMI)    07/ 2015  S/P  DES X3   History of palpitations    Hyperlipidemia    Hypertension    Insomnia 12/08/2018   x many years   Myocardial infarction (HCC) 2015   NSTEMI- 3 stents    OSA (obstructive sleep apnea)    cpap intolerant   Pneumonia due to COVID-19 virus    Right knee meniscal tear    S/P drug eluting coronary stent placement    07/ 2015  x3  to mLCFX, OM1, mLAD   Sinus bradycardia    Sleep apnea    no cpap     Past Surgical History:  Procedure Laterality Date   CATARACT EXTRACTION, BILATERAL     COLONOSCOPY     >10 yrs ago-polyps per pt- we have no report    EXTRACORPOREAL SHOCK WAVE LITHOTRIPSY  x2 last one 2005   EXTRACORPOREAL SHOCK WAVE LITHOTRIPSY Right 05/28/2021   Procedure: EXTRACORPOREAL SHOCK WAVE LITHOTRIPSY (ESWL);  Surgeon: Milderd Meager., MD;  Location: [facility];  Service: Urology;  Laterality: Right;   KNEE ARTHROSCOPY Right 05/02/2015   Procedure: RIGHT KNEE ARTHROSCOPY WITH medial meniscal DEBRIDEMENT ;  Surgeon: Ollen Gross, MD;  Location: [facility];   Service: Orthopedics;  Laterality: Right;   LEFT HEART CATHETERIZATION WITH CORONARY ANGIOGRAM N/A 08/05/2013   Procedure: LEFT HEART CATHETERIZATION WITH CORONARY ANGIOGRAM;  Surgeon: Micheline Chapman, MD;  Location: [facility] CATH LAB;  Service: Cardiovascular;  Laterality: N/A;  Promus DES's to  mLCFx,  OM1,  mLAD (total 3)/   mRCA 30%,  dLM  20-30%,  preserved LVSF, ef 55-65%   LITHOTRIPSY     LUMBAR SPINE SURGERY  x3  last one 1980's   SHOULDER ARTHROSCOPY Right 2013   TONSILLECTOMY      Prior to Admission medications   Medication Sig Start Date End Date Taking? Authorizing Provider  albuterol (VENTOLIN HFA) 108 (90 Base) MCG/ACT inhaler INHALE 2 PUFFS INTO THE LUNGS EVERY 6  HOURS AS NEEDED FOR WHEEZING OR SHORTNESS OF BREATH. 10/24/21  Yes Hawks, Christy A, FNP  alfuzosin (UROXATRAL) 10 MG 24 hr tablet Take 1 tablet (10 mg total) by mouth daily with breakfast. Please stop Silodosin 8mg  and start taking Alfuzosin 04/09/22  Yes McKenzie, Mardene Celeste, MD  aspirin 81 MG chewable tablet Chew 1 tablet (81 mg total) by mouth daily. 08/06/13  Yes Christiane Ha, MD  atorvastatin (LIPITOR) 40 MG tablet TAKE  1 TABLET (40 MG TOTAL) BY MOUTH DAILY. 04/07/22  Yes Hawks, Christy A, FNP  fluticasone (FLONASE) 50 MCG/ACT nasal spray USE 2 SPRAYS IN EACH NOSTRIL EVERY DAY 02/28/22  Yes Hawks, Christy A, FNP  fluticasone-salmeterol (ADVAIR) 250-50 MCG/ACT AEPB Inhale 1 puff into the lungs in the morning and at bedtime. 02/12/21  Yes Deliah Boston F, FNP  levocetirizine (XYZAL) 5 MG tablet TAKE 1 TABLET EVERY EVENING 04/24/22  Yes [name] A, FNP  metoprolol tartrate (LOPRESSOR) 25 MG tablet TAKE 1/2 TABLET TWICE DAILY 05/07/22  Yes Hawks, Edilia Bo, FNP  Misc Natural Products (NEURIVA PO) Take by mouth.   Yes [provider]  montelukast (SINGULAIR) 10 MG tablet TAKE 1 TABLET AT BEDTIME 04/24/22  Yes Hawks, Christy A, FNP  diclofenac (VOLTAREN) 75 MG EC tablet Take 1 tablet (75 mg total) by mouth 2 (two)  times daily. 10/22/21   Hawks, Neysa Bonito A, FNP  EPINEPHRINE 0.3 mg/0.3 mL IJ SOAJ injection INJECT 0.3 MG INTO THE MUSCLE AS NEEDED FOR ANAPHYLAXIS. 05/17/20   Gwenlyn Fudge, FNP  KRILL OIL PO Take by mouth.    [provider]  methocarbamol (ROBAXIN) 500 MG tablet TAKE 1 TABLET EVERY 8 HOURS AS NEEDED FOR MUSCLE SPASM(S) 02/11/22   Junie Spencer, FNP  Multiple Vitamins-Minerals (EMERGEN-C BLUE) PACK Take 1 each by mouth daily. Patient not taking: Reported on 04/24/2022    [provider]  nitroGLYCERIN (NITROSTAT) 0.4 MG SL tablet PLACE 1 TABLET (0.4 MG TOTAL) UNDER THE TONGUE EVERY 5 (FIVE) MINUTES AS NEEDED FOR CHEST PAIN. 08/01/21   Gwenlyn Fudge, FNP  omeprazole (PRILOSEC) 20 MG capsule TAKE 1 CAPSULE EVERY EVENING 10/17/21   Gwenlyn Fudge, FNP  traZODone (DESYREL) 50 MG tablet TAKE 1 TABLET (50 MG TOTAL) BY MOUTH AT BEDTIME. 04/07/22   Junie Spencer, FNP    Current Outpatient Medications  Medication Sig Dispense Refill   albuterol (VENTOLIN HFA) 108 (90 Base) MCG/ACT inhaler INHALE 2 PUFFS INTO THE LUNGS EVERY 6  HOURS AS NEEDED FOR WHEEZING OR SHORTNESS OF BREATH. 20.1 g 1   alfuzosin (UROXATRAL) 10 MG 24 hr tablet Take 1 tablet (10 mg total) by mouth daily with breakfast. Please stop Silodosin 8mg  and start taking Alfuzosin 90 tablet 3   aspirin 81 MG chewable tablet Chew 1 tablet (81 mg total) by mouth daily.     atorvastatin (LIPITOR) 40 MG tablet TAKE 1 TABLET (40 MG TOTAL) BY MOUTH DAILY. 90 tablet 0   fluticasone (FLONASE) 50 MCG/ACT nasal spray USE 2 SPRAYS IN EACH NOSTRIL EVERY DAY 48 g 1   fluticasone-salmeterol (ADVAIR) 250-50 MCG/ACT AEPB Inhale 1 puff into the lungs in the morning and at bedtime. 60 each 5   levocetirizine (XYZAL) 5 MG tablet TAKE 1 TABLET EVERY EVENING 90 tablet 1   metoprolol tartrate (LOPRESSOR) 25 MG tablet TAKE 1/2 TABLET TWICE DAILY 90 tablet 0   Misc Natural Products (NEURIVA PO) Take by mouth.     montelukast (SINGULAIR) 10 MG  tablet TAKE 1 TABLET AT BEDTIME 90 tablet 1   diclofenac (VOLTAREN) 75 MG EC tablet Take 1 tablet (75 mg total) by mouth 2 (two) times daily. 120 tablet 2   EPINEPHRINE 0.3 mg/0.3 mL IJ SOAJ injection INJECT 0.3 MG INTO THE MUSCLE AS NEEDED FOR ANAPHYLAXIS. 2 each 2   KRILL OIL PO Take by mouth.     methocarbamol (ROBAXIN) 500 MG tablet TAKE 1 TABLET EVERY 8 HOURS AS NEEDED FOR MUSCLE  SPASM(S) 180 tablet 1   Multiple Vitamins-Minerals (EMERGEN-C BLUE) PACK Take 1 each by mouth daily. (Patient not taking: Reported on 04/24/2022)     nitroGLYCERIN (NITROSTAT) 0.4 MG SL tablet PLACE 1 TABLET (0.4 MG TOTAL) UNDER THE TONGUE EVERY 5 (FIVE) MINUTES AS NEEDED FOR CHEST PAIN. 25 tablet 2   omeprazole (PRILOSEC) 20 MG capsule TAKE 1 CAPSULE EVERY EVENING 90 capsule 1   traZODone (DESYREL) 50 MG tablet TAKE 1 TABLET (50 MG TOTAL) BY MOUTH AT BEDTIME. 90 tablet 0   Current Facility-Administered Medications  Medication Dose Route Frequency Provider Last Rate Last Admin   0.9 %  sodium chloride infusion  500 mL Intravenous Continuous Iva Boop, MD        Allergies as of 05/22/2022 - Review Complete 05/22/2022  Allergen Reaction Noted   Bee venom Shortness Of Breath 01/11/2020   Oxycodone Nausea And Vomiting 04/27/2015   Percocet [oxycodone-acetaminophen] Nausea And Vomiting 01/11/2020    Family History  Problem Relation Age of Onset   Coronary artery disease Mother        CABG   CAD Sister    Bone cancer Sister    Alzheimer's disease Father    Aneurysm Brother        brain   Heart failure Sister    Bone cancer Sister    Colon cancer Neg Hx    Colon polyps Neg Hx    Esophageal cancer Neg Hx    Rectal cancer Neg Hx    Stomach cancer Neg Hx     Social History   Socioeconomic History   Marital status: Married    Spouse name: Cala Bradford   Number of children: 1   Years of education: 12   Highest education level: Some college, no degree  Occupational History   Occupation: [employer]    Comment: Retired - prior Printmaker  Tobacco Use   Smoking status: Former    Years: 1    Types: Cigarettes    Quit date: 04/27/1966    Years since quitting: 56.1   Smokeless tobacco: Never  Vaping Use   Vaping Use: Never used  Substance and Sexual Activity   Alcohol use: Yes    Comment: occasional   Drug use: No   Sexual activity: Yes  Other Topics Concern   Not on file  Social History Narrative   Son lives with them   One level home   Social Determinants of Health   Financial Resource Strain: Low Risk  (04/07/2022)   Overall Financial Resource Strain (CARDIA)    Difficulty of Paying Living Expenses: Not hard at all  Food Insecurity: No Food Insecurity (04/07/2022)   Hunger Vital Sign    Worried About Running Out of Food in the Last Year: Never true    Ran Out of Food in the Last Year: Never true  Transportation Needs: No Transportation Needs (04/07/2022)   PRAPARE - Administrator, Civil Service (Medical): No    Lack of Transportation (Non-Medical): No  Physical Activity: Sufficiently Active (04/07/2022)   Exercise Vital Sign    Days of Exercise per Week: 5 days    Minutes of Exercise per Session: 60 min  Stress: No Stress Concern Present (04/07/2022)   [employer] of Occupational Health - Occupational Stress Questionnaire    Feeling of Stress : Not at all  Social Connections: Moderately Isolated (04/07/2022)   Social Connection and Isolation Panel [NHANES]    Frequency of Communication with  Friends and Family: More than three times a week    Frequency of Social Gatherings with Friends and Family: More than three times a week    Attends Religious Services: Never    Database administrator or Organizations: No    Attends Banker Meetings: Never    Marital Status: Married  Catering manager Violence: Not At Risk (04/07/2022)   Humiliation, Afraid, Rape, and Kick questionnaire    Fear of Current or Ex-Partner: No     Emotionally Abused: No    Physically Abused: No    Sexually Abused: No    Review of Systems:  All other review of systems negative except as mentioned in the HPI.  Physical Exam: Vital signs BP 137/64   Pulse (!) 56   Temp (!) 97.5 F (36.4 C)   Resp 13   Ht 5\' 9"  (1.753 m)   Wt 220 lb (99.8 kg)   SpO2 100%   BMI 32.49 kg/m   General:   Alert,  Well-developed, well-nourished, pleasant and cooperative in NAD Lungs:  Clear throughout to auscultation.   Heart:  Regular rate and rhythm; no murmurs, clicks, rubs,  or gallops. Abdomen:  Soft, nontender and nondistended. Normal bowel sounds.   Neuro/Psych:  Alert and cooperative. Normal mood and affect. A and O x 3   @Yoshie Kosel  Sena Slate, MD, [facility] [phone] (pager) 05/22/2022 10:59 AM@

## 2022-05-22 NOTE — Progress Notes (Signed)
Vss nad trans to pacu 

## 2022-05-23 ENCOUNTER — Telehealth: Payer: Self-pay

## 2022-05-23 NOTE — Telephone Encounter (Signed)
  Follow up Call-     05/22/2022    9:52 AM  Call back number  Post procedure Call Back phone  # [phone]  Permission to leave phone message Yes     Patient questions:  Do you have a fever, pain , or abdominal swelling? No. Pain Score  0 *  Have you tolerated food without any problems? Yes.    Have you been able to return to your normal activities? Yes.    Do you have any questions about your discharge instructions: Diet   No. Medications  No. Follow up visit  No.  Do you have questions or concerns about your Care? No.  Actions: * If pain score is 4 or above: No action needed, pain <4.

## 2022-06-04 ENCOUNTER — Encounter: Payer: Self-pay | Admitting: Internal Medicine

## 2022-06-14 ENCOUNTER — Other Ambulatory Visit: Payer: Self-pay | Admitting: Family Medicine

## 2022-06-14 DIAGNOSIS — I252 Old myocardial infarction: Secondary | ICD-10-CM

## 2022-06-14 DIAGNOSIS — F5101 Primary insomnia: Secondary | ICD-10-CM

## 2022-06-14 DIAGNOSIS — I2510 Atherosclerotic heart disease of native coronary artery without angina pectoris: Secondary | ICD-10-CM

## 2022-06-14 DIAGNOSIS — E785 Hyperlipidemia, unspecified: Secondary | ICD-10-CM

## 2022-06-16 NOTE — Telephone Encounter (Signed)
Last office visit 12/24/21 Last refill 04/07/22, #90 no refills

## 2022-06-17 ENCOUNTER — Telehealth: Payer: Self-pay | Admitting: Internal Medicine

## 2022-06-17 NOTE — Telephone Encounter (Signed)
No pain anywhere, no sweating, no nausea, no dizziness, no blurred vision, no fainting, no blood pressure issues, no swelling...  In the mornings it is a tingling/crawling sensation in shoulders to arms, then "wakes up."  Arms intermittently go to sleep when he lays on his sides to sleep, but not when he lays on his back. Arms fall asleep at times when he is just sitting/not doing anything- he will move arms around and it "wakes up." He has been wearing the braces, but this has not helped. He wanted to make sure that it was not heart related.  Informed pt that it does not sound likely related to his heart, but will send this information to his provider to review and for any recommendations. Mentioned following up with his PCP and asking for a referral to Ortho for further work-up. He verbalized understanding.

## 2022-06-17 NOTE — Telephone Encounter (Signed)
Patient stated his both of his arms are "falling asleep" and he is concerned this may be heart related.  Patient states this happens when he sits still, when sleeping and when driving.  Patient stated his doctor suggested it might be carpal tunnel and he tried the hand braces for 2-3 months and that has not helped.

## 2022-06-18 NOTE — Telephone Encounter (Signed)
I would suspect its coming from his neck/cervical spine - should seek PCP to get neck imaging or referral to ortho spine or neurosurgery.  Dr. Rexene Edison   I gave him the information above. He states that he last saw an Ortho provider 3-4 months ago and got a steroid injection. Instructed to call ortho and let them know of your current symptoms and get an appointment. He verbalized understanding.

## 2022-07-08 ENCOUNTER — Other Ambulatory Visit: Payer: Self-pay | Admitting: Family Medicine

## 2022-07-08 DIAGNOSIS — K219 Gastro-esophageal reflux disease without esophagitis: Secondary | ICD-10-CM

## 2022-07-08 MED ORDER — OMEPRAZOLE 20 MG PO CPDR: DELAYED_RELEASE_CAPSULE | ORAL | 0 refills | Status: DC

## 2022-07-08 NOTE — Telephone Encounter (Signed)
I called pt & made him an appt on July 31 2022 at 11:55am w/Hawks for med refill.

## 2022-07-08 NOTE — Telephone Encounter (Signed)
Hawks NTBS RF not sent to mail order pharmacy

## 2022-07-08 NOTE — Addendum Note (Signed)
Addended by: Julious Payer D on: 07/08/2022 04:08 PM   Modules accepted: Orders

## 2022-07-19 ENCOUNTER — Other Ambulatory Visit: Payer: Self-pay | Admitting: Family

## 2022-07-19 DIAGNOSIS — R002 Palpitations: Secondary | ICD-10-CM

## 2022-07-31 ENCOUNTER — Ambulatory Visit: Payer: Medicare HMO | Admitting: Family

## 2022-07-31 ENCOUNTER — Encounter: Payer: Self-pay | Admitting: Family

## 2022-07-31 VITALS — BP 138/83 | HR 57 | Temp 97.6°F | Ht 69.0 in | Wt 225.2 lb

## 2022-07-31 DIAGNOSIS — E785 Hyperlipidemia, unspecified: Secondary | ICD-10-CM

## 2022-07-31 DIAGNOSIS — R7303 Prediabetes: Secondary | ICD-10-CM | POA: Diagnosis not present

## 2022-07-31 DIAGNOSIS — I10 Essential (primary) hypertension: Secondary | ICD-10-CM | POA: Diagnosis not present

## 2022-07-31 DIAGNOSIS — G4733 Obstructive sleep apnea (adult) (pediatric): Secondary | ICD-10-CM

## 2022-07-31 DIAGNOSIS — J4541 Moderate persistent asthma with (acute) exacerbation: Secondary | ICD-10-CM

## 2022-07-31 DIAGNOSIS — F5101 Primary insomnia: Secondary | ICD-10-CM

## 2022-07-31 DIAGNOSIS — K219 Gastro-esophageal reflux disease without esophagitis: Secondary | ICD-10-CM

## 2022-07-31 DIAGNOSIS — Z0000 Encounter for general adult medical examination without abnormal findings: Secondary | ICD-10-CM

## 2022-07-31 DIAGNOSIS — I2510 Atherosclerotic heart disease of native coronary artery without angina pectoris: Secondary | ICD-10-CM | POA: Diagnosis not present

## 2022-07-31 DIAGNOSIS — I252 Old myocardial infarction: Secondary | ICD-10-CM | POA: Diagnosis not present

## 2022-07-31 LAB — BAYER DCA HB A1C WAIVED: HB A1C (BAYER DCA - WAIVED): 5.7 % — ABNORMAL HIGH (ref 4.8–5.6)

## 2022-07-31 MED ORDER — OMEPRAZOLE 20 MG PO CPDR: DELAYED_RELEASE_CAPSULE | ORAL | 0 refills | Status: DC

## 2022-07-31 MED ORDER — DICLOFENAC SODIUM 1 % EX GEL: 2.0000 g | Freq: Four times a day (QID) | CUTANEOUS | 2 refills | Status: DC

## 2022-07-31 NOTE — Progress Notes (Signed)
Subjective:    Patient ID: Ronald Berger, male    DOB: Jun 07, 1945, 77 y.o.   MRN: [id number]  Chief Complaint  Patient presents with   Medical Management of Chronic Issues   Pt presents to the office today CPE and chronic follow up. He is followed by nephrologists for every 6 months for kidneys stones.   Followed by Ortho every 6 month for chronic neck pain. Getting steroid injections.    He has CAD and has hs NSTEMI and takes atorvastatin daily. Followed by Cardiologists every other year.    Has OSA, but does not wear CPAP. Has been years since last sleep study.  Hypertension This is a chronic problem. The current episode started more than 1 year ago. The problem has been resolved since onset. The problem is controlled. Associated symptoms include malaise/fatigue. Pertinent negatives include no blurred vision, peripheral edema or shortness of breath. Risk factors for coronary artery disease include obesity, male gender and dyslipidemia. The current treatment provides moderate improvement.  Asthma He complains of wheezing. There is no cough or shortness of breath. This is a chronic problem. The current episode started more than 1 year ago. The problem occurs intermittently. The problem has been waxing and waning. Associated symptoms include heartburn and malaise/fatigue. His symptoms are alleviated by beta-agonist and leukotriene antagonist. He reports moderate improvement on treatment. His past medical history is significant for asthma.  Gastroesophageal Reflux He complains of belching, heartburn and wheezing. He reports no coughing. This is a chronic problem. The current episode started more than 1 year ago. The problem occurs rarely. Risk factors include obesity. He has tried a PPI for the symptoms. The treatment provided moderate relief.  Hyperlipidemia This is a chronic problem. The current episode started more than 1 year ago. The problem is controlled. Recent lipid tests were  reviewed and are normal. Exacerbating diseases include obesity. Pertinent negatives include no shortness of breath. Current antihyperlipidemic treatment includes statins. The current treatment provides moderate improvement of lipids. Risk factors for coronary artery disease include dyslipidemia, diabetes mellitus, male sex and hypertension.  Diabetes He presents for his follow-up diabetic visit. Diabetes type: prediabetic. Pertinent negatives for diabetes include no blurred vision and no foot paresthesias. Symptoms are stable. Risk factors for coronary artery disease include hypertension and male sex. He is following a generally healthy diet. (Does not check at home)      Review of Systems  Constitutional:  Positive for malaise/fatigue.  Eyes:  Negative for blurred vision.  Respiratory:  Positive for wheezing. Negative for cough and shortness of breath.   Gastrointestinal:  Positive for heartburn.  All other systems reviewed and are negative.  Family History  Problem Relation Age of Onset   Coronary artery disease Mother        CABG   CAD Sister    Bone cancer Sister    Alzheimer's disease Father    Aneurysm Brother        brain   Heart failure Sister    Bone cancer Sister    Colon cancer Neg Hx    Colon polyps Neg Hx    Esophageal cancer Neg Hx    Rectal cancer Neg Hx    Stomach cancer Neg Hx    Social History   Socioeconomic History   Marital status: Married    Spouse name: Cala Bradford   Number of children: 1   Years of education: 12   Highest education level: Some college, no degree  Occupational History  Occupation: [employer]    Comment: Retired - prior Printmaker  Tobacco Use   Smoking status: Former    Current packs/day: 0.00    Types: Cigarettes    Start date: 04/26/1965    Quit date: 04/27/1966    Years since quitting: 56.2   Smokeless tobacco: Never  Vaping Use   Vaping status: Never Used  Substance and Sexual Activity   Alcohol use: Yes     Comment: occasional   Drug use: No   Sexual activity: Yes  Other Topics Concern   Not on file  Social History Narrative   Son lives with them   One level home   Social Determinants of Health   Financial Resource Strain: Low Risk  (04/07/2022)   Overall Financial Resource Strain (CARDIA)    Difficulty of Paying Living Expenses: Not hard at all  Food Insecurity: No Food Insecurity (04/07/2022)   Hunger Vital Sign    Worried About Running Out of Food in the Last Year: Never true    Ran Out of Food in the Last Year: Never true  Transportation Needs: No Transportation Needs (04/07/2022)   PRAPARE - Administrator, Civil Service (Medical): No    Lack of Transportation (Non-Medical): No  Physical Activity: Sufficiently Active (04/07/2022)   Exercise Vital Sign    Days of Exercise per Week: 5 days    Minutes of Exercise per Session: 60 min  Stress: No Stress Concern Present (04/07/2022)   [employer] of Occupational Health - Occupational Stress Questionnaire    Feeling of Stress : Not at all  Social Connections: Moderately Isolated (04/07/2022)   Social Connection and Isolation Panel [NHANES]    Frequency of Communication with Friends and Family: More than three times a week    Frequency of Social Gatherings with Friends and Family: More than three times a week    Attends Religious Services: Never    Database administrator or Organizations: No    Attends Banker Meetings: Never    Marital Status: Married       Objective:   Physical Exam Vitals reviewed.  Constitutional:      General: He is not in acute distress.    Appearance: He is well-developed. He is obese.  HENT:     Head: Normocephalic.     Right Ear: Tympanic membrane normal.     Left Ear: Tympanic membrane normal.  Eyes:     General:        Right eye: No discharge.        Left eye: No discharge.     Pupils: Pupils are equal, round, and reactive to light.  Neck:     Thyroid: No  thyromegaly.  Cardiovascular:     Rate and Rhythm: Normal rate and regular rhythm.     Heart sounds: Normal heart sounds. No murmur heard. Pulmonary:     Effort: Pulmonary effort is normal. No respiratory distress.     Breath sounds: Normal breath sounds. No wheezing.  Abdominal:     General: Bowel sounds are normal. There is no distension.     Palpations: Abdomen is soft.     Tenderness: There is no abdominal tenderness.  Musculoskeletal:        General: No tenderness. Normal range of motion.     Cervical back: Normal range of motion and neck supple.  Skin:    General: Skin is warm and dry.     Findings: No erythema  or rash.  Neurological:     Mental Status: He is alert and oriented to person, place, and time.     Cranial Nerves: No cranial nerve deficit.     Deep Tendon Reflexes: Reflexes are normal and symmetric.  Psychiatric:        Behavior: Behavior normal.        Thought Content: Thought content normal.        Judgment: Judgment normal.       BP 138/83   Pulse (!) 57   Temp 97.6 F (36.4 C) (Temporal)   Ht 5\' 9"  (1.753 m)   Wt 225 lb 3.2 oz (102.2 kg)   SpO2 96%   BMI 33.26 kg/m      Assessment & Plan:  LAURA RADILLA comes in today with chief complaint of Medical Management of Chronic Issues   Diagnosis and orders addressed:  1. Gastroesophageal reflux disease, unspecified whether esophagitis present - omeprazole (PRILOSEC) 20 MG capsule; TAKE 1 CAPSULE EVERY EVENING  Dispense: 90 capsule; Refill: 0 - CMP14+EGFR - CBC with Differential/Platelet  2. CAD in native artery - CMP14+EGFR - CBC with Differential/Platelet  3. History of non-ST elevation myocardial infarction (NSTEMI) - CMP14+EGFR - CBC with Differential/Platelet  4. Hyperlipidemia LDL goal <70 - CMP14+EGFR - CBC with Differential/Platelet - Lipid panel  5. Primary insomnia - CMP14+EGFR - CBC with Differential/Platelet  6. Moderate persistent asthma with exacerbation -  CMP14+EGFR - CBC with Differential/Platelet  7. OSA (obstructive sleep apnea) - CMP14+EGFR - CBC with Differential/Platelet  8. Prediabetes - Bayer DCA Hb A1c Waived - CMP14+EGFR - CBC with Differential/Platelet  9. Primary hypertension - CMP14+EGFR - CBC with Differential/Platelet  10. Annual physical exam - Bayer DCA Hb A1c Waived - CMP14+EGFR - CBC with Differential/Platelet - Lipid panel   Labs pending Health Maintenance reviewed Diet and exercise encouraged  Follow up plan: 6 months    [name], FNP

## 2022-07-31 NOTE — Patient Instructions (Signed)
Health Maintenance After Age 75 After age 75, you are at a higher risk for certain long-term diseases and infections as well as injuries from falls. Falls are a major cause of broken bones and head injuries in people who are older than age 75. Getting regular preventive care can help to keep you healthy and well. Preventive care includes getting regular testing and making lifestyle changes as recommended by your health care provider. Talk with your health care provider about: Which screenings and tests you should have. A screening is a test that checks for a disease when you have no symptoms. A diet and exercise plan that is right for you. What should I know about screenings and tests to prevent falls? Screening and testing are the best ways to find a health problem early. Early diagnosis and treatment give you the best chance of managing medical conditions that are common after age 75. Certain conditions and lifestyle choices may make you more likely to have a fall. Your health care provider may recommend: Regular vision checks. Poor vision and conditions such as cataracts can make you more likely to have a fall. If you wear glasses, make sure to get your prescription updated if your vision changes. Medicine review. Work with your health care provider to regularly review all of the medicines you are taking, including over-the-counter medicines. Ask your health care provider about any side effects that may make you more likely to have a fall. Tell your health care provider if any medicines that you take make you feel dizzy or sleepy. Strength and balance checks. Your health care provider may recommend certain tests to check your strength and balance while standing, walking, or changing positions. Foot health exam. Foot pain and numbness, as well as not wearing proper footwear, can make you more likely to have a fall. Screenings, including: Osteoporosis screening. Osteoporosis is a condition that causes  the bones to get weaker and break more easily. Blood pressure screening. Blood pressure changes and medicines to control blood pressure can make you feel dizzy. Depression screening. You may be more likely to have a fall if you have a fear of falling, feel depressed, or feel unable to do activities that you used to do. Alcohol use screening. Using too much alcohol can affect your balance and may make you more likely to have a fall. Follow these instructions at home: Lifestyle Do not drink alcohol if: Your health care provider tells you not to drink. If you drink alcohol: Limit how much you have to: 0-1 drink a day for women. 0-2 drinks a day for men. Know how much alcohol is in your drink. In the [place] one drink equals one 12 oz bottle of beer (355 mL), one 5 oz glass of wine (148 mL), or one 1 oz glass of hard liquor (44 mL). Do not use any products that contain nicotine or tobacco. These products include cigarettes, chewing tobacco, and vaping devices, such as e-cigarettes. If you need help quitting, ask your health care provider. Activity  Follow a regular exercise program to stay fit. This will help you maintain your balance. Ask your health care provider what types of exercise are appropriate for you. If you need a cane or walker, use it as recommended by your health care provider. Wear supportive shoes that have nonskid soles. Safety  Remove any tripping hazards, such as rugs, cords, and clutter. Install safety equipment such as grab bars in bathrooms and safety rails on stairs. Keep rooms and walkways   well-lit. General instructions Talk with your health care provider about your risks for falling. Tell your health care provider if: You fall. Be sure to tell your health care provider about all falls, even ones that seem minor. You feel dizzy, tiredness (fatigue), or off-balance. Take over-the-counter and prescription medicines only as told by your health care provider. These include  supplements. Eat a healthy diet and maintain a healthy weight. A healthy diet includes low-fat dairy products, low-fat (lean) meats, and fiber from whole grains, beans, and lots of fruits and vegetables. Stay current with your vaccines. Schedule regular health, dental, and eye exams. Summary Having a healthy lifestyle and getting preventive care can help to protect your health and wellness after age 75. Screening and testing are the best way to find a health problem early and help you avoid having a fall. Early diagnosis and treatment give you the best chance for managing medical conditions that are more common for people who are older than age 75. Falls are a major cause of broken bones and head injuries in people who are older than age 75. Take precautions to prevent a fall at home. Work with your health care provider to learn what changes you can make to improve your health and wellness and to prevent falls. This information is not intended to replace advice given to you by your health care provider. Make sure you discuss any questions you have with your health care provider. Document Revised: 05/28/2020 Document Reviewed: 05/28/2020 Elsevier Patient Education  2024 Elsevier Inc.  

## 2022-08-01 LAB — CBC WITH DIFFERENTIAL/PLATELET
Basophils Absolute: 0.1 10*3/uL (ref 0.0–0.2)
Basos: 1 %
EOS (ABSOLUTE): 0.4 10*3/uL (ref 0.0–0.4)
Eos: 5 %
Hematocrit: 44.6 % (ref 37.5–51.0)
Hemoglobin: 14.3 g/dL (ref 13.0–17.7)
Immature Grans (Abs): 0 10*3/uL (ref 0.0–0.1)
Immature Granulocytes: 0 %
Lymphocytes Absolute: 3.4 10*3/uL — ABNORMAL HIGH (ref 0.7–3.1)
Lymphs: 43 %
MCH: 27.4 pg (ref 26.6–33.0)
MCHC: 32.1 g/dL (ref 31.5–35.7)
MCV: 85 fL (ref 79–97)
Monocytes Absolute: 0.7 10*3/uL (ref 0.1–0.9)
Monocytes: 9 %
Neutrophils Absolute: 3.3 10*3/uL (ref 1.4–7.0)
Neutrophils: 42 %
Platelets: 212 10*3/uL (ref 150–450)
RBC: 5.22 x10E6/uL (ref 4.14–5.80)
RDW: 12.3 % (ref 11.6–15.4)
WBC: 8 10*3/uL (ref 3.4–10.8)

## 2022-08-01 LAB — CMP14+EGFR
ALT: 47 IU/L — ABNORMAL HIGH (ref 0–44)
AST: 29 IU/L (ref 0–40)
Albumin: 4.1 g/dL (ref 3.8–4.8)
Alkaline Phosphatase: 95 IU/L (ref 44–121)
BUN/Creatinine Ratio: 21 (ref 10–24)
BUN: 22 mg/dL (ref 8–27)
Bilirubin Total: 0.4 mg/dL (ref 0.0–1.2)
CO2: 22 mmol/L (ref 20–29)
Calcium: 9 mg/dL (ref 8.6–10.2)
Chloride: 104 mmol/L (ref 96–106)
Creatinine, Ser: 1.05 mg/dL (ref 0.76–1.27)
Globulin, Total: 2.1 g/dL (ref 1.5–4.5)
Glucose: 77 mg/dL (ref 70–99)
Potassium: 4.4 mmol/L (ref 3.5–5.2)
Sodium: 141 mmol/L (ref 134–144)
Total Protein: 6.2 g/dL (ref 6.0–8.5)
eGFR: 74 mL/min/{1.73_m2} (ref >59)

## 2022-08-01 LAB — LIPID PANEL
Chol/HDL Ratio: 3.6 ratio (ref 0.0–5.0)
Cholesterol, Total: 127 mg/dL (ref 100–199)
HDL: 35 mg/dL — ABNORMAL LOW (ref >39)
LDL Chol Calc (NIH): 64 mg/dL (ref 0–99)
Triglycerides: 165 mg/dL — ABNORMAL HIGH (ref 0–149)
VLDL Cholesterol Cal: 28 mg/dL (ref 5–40)

## 2022-08-19 ENCOUNTER — Other Ambulatory Visit: Payer: Self-pay | Admitting: Family

## 2022-08-19 DIAGNOSIS — M542 Cervicalgia: Secondary | ICD-10-CM

## 2022-08-21 ENCOUNTER — Other Ambulatory Visit: Payer: Self-pay | Admitting: Family

## 2022-08-21 DIAGNOSIS — I2510 Atherosclerotic heart disease of native coronary artery without angina pectoris: Secondary | ICD-10-CM

## 2022-08-21 DIAGNOSIS — I252 Old myocardial infarction: Secondary | ICD-10-CM

## 2022-08-21 DIAGNOSIS — E785 Hyperlipidemia, unspecified: Secondary | ICD-10-CM

## 2022-09-11 ENCOUNTER — Other Ambulatory Visit: Payer: Self-pay | Admitting: Family

## 2022-09-11 DIAGNOSIS — K219 Gastro-esophageal reflux disease without esophagitis: Secondary | ICD-10-CM

## 2022-09-17 ENCOUNTER — Other Ambulatory Visit: Payer: Self-pay | Admitting: Family

## 2022-09-17 DIAGNOSIS — J301 Allergic rhinitis due to pollen: Secondary | ICD-10-CM

## 2022-09-17 DIAGNOSIS — J4530 Mild persistent asthma, uncomplicated: Secondary | ICD-10-CM

## 2022-09-19 ENCOUNTER — Other Ambulatory Visit: Payer: Self-pay | Admitting: Family

## 2022-09-19 DIAGNOSIS — U071 COVID-19: Secondary | ICD-10-CM

## 2022-10-02 ENCOUNTER — Other Ambulatory Visit: Payer: Self-pay | Admitting: Family

## 2022-10-02 DIAGNOSIS — R002 Palpitations: Secondary | ICD-10-CM

## 2022-10-16 ENCOUNTER — Ambulatory Visit (INDEPENDENT_AMBULATORY_CARE_PROVIDER_SITE_OTHER): Payer: Medicare HMO

## 2022-10-16 ENCOUNTER — Ambulatory Visit (INDEPENDENT_AMBULATORY_CARE_PROVIDER_SITE_OTHER): Payer: Medicare HMO | Admitting: Family Medicine

## 2022-10-16 ENCOUNTER — Encounter: Payer: Self-pay | Admitting: Family Medicine

## 2022-10-16 VITALS — BP 132/69 | HR 55 | Ht 69.0 in | Wt 221.0 lb

## 2022-10-16 DIAGNOSIS — M25521 Pain in right elbow: Secondary | ICD-10-CM

## 2022-10-16 DIAGNOSIS — S59901A Unspecified injury of right elbow, initial encounter: Secondary | ICD-10-CM

## 2022-10-16 DIAGNOSIS — Z23 Encounter for immunization: Secondary | ICD-10-CM

## 2022-10-16 DIAGNOSIS — S46219A Strain of muscle, fascia and tendon of other parts of biceps, unspecified arm, initial encounter: Secondary | ICD-10-CM

## 2022-10-16 DIAGNOSIS — S46211A Strain of muscle, fascia and tendon of other parts of biceps, right arm, initial encounter: Secondary | ICD-10-CM | POA: Diagnosis not present

## 2022-10-16 NOTE — Progress Notes (Signed)
BP 132/69   Pulse (!) 55   Ht 5\' 9"  (1.753 m)   Wt 221 lb (100.2 kg)   SpO2 97%   BMI 32.64 kg/m    Subjective:   Patient ID: Ronald Berger, male    DOB: [birth year], 77 y.o.   MRN: [id number]  HPI: Ronald Berger is a 77 y.o. male presenting on 10/16/2022 for Elbow Pain (Right. While picking up block of wood)   HPI Right elbow pain while picking up a block of wood right elbow pain while picking up a block of wood.  Yesterday trying to catch a block of dropped wood and felt or heard something pop.  Tylenol maybe helped some.  More comfortable in flexed position.  He denies the pain being severe but it did keep him up at night.  Relevant past medical, surgical, family and social history reviewed and updated as indicated. Interim medical history since our last visit reviewed. Allergies and medications reviewed and updated.  Review of Systems  Constitutional:  Negative for chills and fever.  Respiratory:  Negative for shortness of breath and wheezing.   Cardiovascular:  Negative for chest pain and leg swelling.  Musculoskeletal:  Positive for arthralgias and myalgias. Negative for back pain and gait problem.  Skin:  Negative for rash.  All other systems reviewed and are negative.   Per HPI unless specifically indicated above   Allergies as of 10/16/2022       Reactions   Bee Venom Shortness Of Breath   Oxycodone Nausea And Vomiting   Percocet [oxycodone-acetaminophen] Nausea And Vomiting        Medication List        Accurate as of October 16, 2022  3:33 PM. If you have any questions, ask your nurse or doctor.          albuterol 108 (90 Base) MCG/ACT inhaler Commonly known as: VENTOLIN HFA INHALE 2 PUFFS INTO THE LUNGS EVERY 6  HOURS AS NEEDED FOR WHEEZING OR SHORTNESS OF BREATH.   alfuzosin 10 MG 24 hr tablet Commonly known as: UROXATRAL Take 1 tablet (10 mg total) by mouth daily with breakfast. Please stop Silodosin 8mg  and start taking Alfuzosin    aspirin 81 MG chewable tablet Chew 1 tablet (81 mg total) by mouth daily.   atorvastatin 40 MG tablet Commonly known as: LIPITOR TAKE 1 TABLET EVERY DAY   diclofenac 75 MG EC tablet Commonly known as: VOLTAREN TAKE 1 TABLET TWICE DAILY   diclofenac Sodium 1 % Gel Commonly known as: Voltaren Apply 2 g topically 4 (four) times daily.   Emergen-C Blue Pack Take 1 each by mouth daily.   EPINEPHrine 0.3 mg/0.3 mL Soaj injection Commonly known as: EPI-PEN INJECT 0.3 MG INTO THE MUSCLE AS NEEDED FOR ANAPHYLAXIS.   fluticasone 50 MCG/ACT nasal spray Commonly known as: FLONASE USE 2 SPRAYS IN EACH NOSTRIL EVERY DAY   fluticasone-salmeterol 250-50 MCG/ACT Aepb Commonly known as: ADVAIR Inhale 1 puff into the lungs in the morning and at bedtime.   KRILL OIL PO Take by mouth.   levocetirizine 5 MG tablet Commonly known as: XYZAL TAKE 1 TABLET EVERY EVENING   methocarbamol 500 MG tablet Commonly known as: ROBAXIN TAKE 1 TABLET EVERY 8 HOURS AS NEEDED FOR MUSCLE SPASM(S)   metoprolol tartrate 25 MG tablet Commonly known as: LOPRESSOR TAKE 1/2 TABLET TWICE DAILY   montelukast 10 MG tablet Commonly known as: SINGULAIR TAKE 1 TABLET AT BEDTIME   NEURIVA PO Take by mouth.  nitroGLYCERIN 0.4 MG SL tablet Commonly known as: NITROSTAT PLACE 1 TABLET (0.4 MG TOTAL) UNDER THE TONGUE EVERY 5 (FIVE) MINUTES AS NEEDED FOR CHEST PAIN.   omeprazole 20 MG capsule Commonly known as: PRILOSEC TAKE 1 CAPSULE EVERY EVENING   traZODone 50 MG tablet Commonly known as: DESYREL TAKE 1 TABLET (50 MG TOTAL) BY MOUTH AT BEDTIME         Objective:   BP 132/69   Pulse (!) 55   Ht 5\' 9"  (1.753 m)   Wt 221 lb (100.2 kg)   SpO2 97%   BMI 32.64 kg/m   Wt Readings from Last 3 Encounters:  10/16/22 221 lb (100.2 kg)  07/31/22 225 lb 3.2 oz (102.2 kg)  05/22/22 220 lb (99.8 kg)    Physical Exam Vitals and nursing note reviewed.  Constitutional:      General: He is not in acute  distress.    Appearance: He is well-developed. He is not diaphoretic.  Eyes:     General: No scleral icterus.    Conjunctiva/sclera: Conjunctivae normal.  Neck:     Thyroid: No thyromegaly.  Musculoskeletal:        General: Normal range of motion.     Right upper arm: Tenderness present.       Arms:     Cervical back: Neck supple.  Lymphadenopathy:     Cervical: No cervical adenopathy.  Skin:    General: Skin is warm and dry.     Findings: No rash.  Neurological:     Mental Status: He is alert and oriented to person, place, and time.     Coordination: Coordination normal.  Psychiatric:        Behavior: Behavior normal.       Assessment & Plan:   Problem List Items Addressed This Visit   None Visit Diagnoses     Injury of right elbow, initial encounter    -  Primary   Relevant Orders   DG Elbow Complete Right   Ambulatory referral to Orthopedic Surgery   Biceps tendon tear         Likely partial distal biceps tendon tear versus strain, will send orthopedic.  Follow up plan: Return if symptoms worsen or fail to improve.  Counseling provided for all of the vaccine components Orders Placed This Encounter  Procedures   DG Elbow Complete Right   Ambulatory referral to Orthopedic Surgery    Arville Care, MD [facility] 10/16/2022, 3:33 PM

## 2022-10-23 DIAGNOSIS — S46291A Other injury of muscle, fascia and tendon of other parts of biceps, right arm, initial encounter: Secondary | ICD-10-CM | POA: Diagnosis not present

## 2022-10-23 DIAGNOSIS — M25521 Pain in right elbow: Secondary | ICD-10-CM | POA: Diagnosis not present

## 2022-10-26 DIAGNOSIS — M25521 Pain in right elbow: Secondary | ICD-10-CM | POA: Diagnosis not present

## 2022-10-28 DIAGNOSIS — M25521 Pain in right elbow: Secondary | ICD-10-CM | POA: Diagnosis not present

## 2022-10-29 ENCOUNTER — Other Ambulatory Visit: Payer: Self-pay | Admitting: Family

## 2022-10-29 DIAGNOSIS — I252 Old myocardial infarction: Secondary | ICD-10-CM

## 2022-10-29 DIAGNOSIS — E785 Hyperlipidemia, unspecified: Secondary | ICD-10-CM

## 2022-10-29 DIAGNOSIS — I2510 Atherosclerotic heart disease of native coronary artery without angina pectoris: Secondary | ICD-10-CM

## 2022-11-07 ENCOUNTER — Ambulatory Visit (INDEPENDENT_AMBULATORY_CARE_PROVIDER_SITE_OTHER): Payer: Medicare HMO | Admitting: Ophthalmology

## 2022-11-07 ENCOUNTER — Encounter (INDEPENDENT_AMBULATORY_CARE_PROVIDER_SITE_OTHER): Payer: Self-pay | Admitting: Ophthalmology

## 2022-11-07 DIAGNOSIS — H35033 Hypertensive retinopathy, bilateral: Secondary | ICD-10-CM | POA: Diagnosis not present

## 2022-11-07 DIAGNOSIS — H539 Unspecified visual disturbance: Secondary | ICD-10-CM | POA: Diagnosis not present

## 2022-11-07 DIAGNOSIS — H35373 Puckering of macula, bilateral: Secondary | ICD-10-CM

## 2022-11-07 DIAGNOSIS — I10 Essential (primary) hypertension: Secondary | ICD-10-CM

## 2022-11-07 DIAGNOSIS — H25012 Cortical age-related cataract, left eye: Secondary | ICD-10-CM | POA: Diagnosis not present

## 2022-11-07 DIAGNOSIS — Z961 Presence of intraocular lens: Secondary | ICD-10-CM | POA: Diagnosis not present

## 2022-11-07 NOTE — Progress Notes (Signed)
[facility] - Clinic Note  11/07/2022   CHIEF COMPLAINT Patient presents for Retina Follow Up  HISTORY OF PRESENT ILLNESS: Ronald Berger is a 77 y.o. male who presents to the clinic today for:  HPI     Retina Follow Up   In both eyes.  This started 4.  Duration of 4.  Since onset it is gradually worsening.        Comments   Retina follow up pt started having tunnel vision yesterday at work he started seeing flashes and floaters VF limited on both sides and tunnels vision went away after 20 to 30 minutes       Last edited by Etheleen Mayhew, COT on 11/07/2022  1:36 PM.     Patient states that yesterday he had an episode of tunnel vision lasting 20-30 minutes. He did see floaters at that time as well.    Referring physician: Junie Spencer, FNP [address] [place],  [place] [address]  HISTORICAL INFORMATION:  Selected notes from the MEDICAL RECORD NUMBER [facility] pt here for full loss of VF LEE:  Ocular Hx- PMH-   CURRENT MEDICATIONS: No current outpatient medications on file. (Ophthalmic Drugs)   No current facility-administered medications for this visit. (Ophthalmic Drugs)   Current Outpatient Medications (Other)  Medication Sig   albuterol (VENTOLIN HFA) 108 (90 Base) MCG/ACT inhaler INHALE 2 PUFFS INTO THE LUNGS EVERY 6  HOURS AS NEEDED FOR WHEEZING OR SHORTNESS OF BREATH.   alfuzosin (UROXATRAL) 10 MG 24 hr tablet Take 1 tablet (10 mg total) by mouth daily with breakfast. Please stop Silodosin 8mg  and start taking Alfuzosin   aspirin 81 MG chewable tablet Chew 1 tablet (81 mg total) by mouth daily.   atorvastatin (LIPITOR) 40 MG tablet TAKE 1 TABLET EVERY DAY (NEEDS TO BE SEEN BEFORE NEXT REFILL)   diclofenac (VOLTAREN) 75 MG EC tablet TAKE 1 TABLET TWICE DAILY   diclofenac Sodium (VOLTAREN) 1 % GEL Apply 2 g topically 4 (four) times daily.   EPINEPHRINE 0.3 mg/0.3 mL IJ SOAJ injection INJECT 0.3 MG INTO THE MUSCLE AS NEEDED FOR  ANAPHYLAXIS.   fluticasone (FLONASE) 50 MCG/ACT nasal spray USE 2 SPRAYS IN EACH NOSTRIL EVERY DAY   fluticasone-salmeterol (ADVAIR) 250-50 MCG/ACT AEPB Inhale 1 puff into the lungs in the morning and at bedtime.   KRILL OIL PO Take by mouth.   levocetirizine (XYZAL) 5 MG tablet TAKE 1 TABLET EVERY EVENING   methocarbamol (ROBAXIN) 500 MG tablet TAKE 1 TABLET EVERY 8 HOURS AS NEEDED FOR MUSCLE SPASM(S)   metoprolol tartrate (LOPRESSOR) 25 MG tablet TAKE 1/2 TABLET TWICE DAILY   Misc Natural Products (NEURIVA PO) Take by mouth.   montelukast (SINGULAIR) 10 MG tablet TAKE 1 TABLET AT BEDTIME   Multiple Vitamins-Minerals (EMERGEN-C BLUE) PACK Take 1 each by mouth daily.   nitroGLYCERIN (NITROSTAT) 0.4 MG SL tablet PLACE 1 TABLET (0.4 MG TOTAL) UNDER THE TONGUE EVERY 5 (FIVE) MINUTES AS NEEDED FOR CHEST PAIN.   omeprazole (PRILOSEC) 20 MG capsule TAKE 1 CAPSULE EVERY EVENING   traZODone (DESYREL) 50 MG tablet TAKE 1 TABLET (50 MG TOTAL) BY MOUTH AT BEDTIME   No current facility-administered medications for this visit. (Other)   REVIEW OF SYSTEMS: ROS   Positive for: Cardiovascular, Allergic/Imm Last edited by Etheleen Mayhew, COT on 11/07/2022  1:36 PM.     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 NSTEMI 07/2013 - DES to mLCx, DES to prox OM1, DES to mLAD with nonobstructive RCA stenosis, EF 55-65%.   Cataract    Chronic kidney disease    kidney stones    Dyslipidemia    Family history of bone cancer    GERD (gastroesophageal reflux disease)     History of kidney stones    History of non-ST elevation myocardial infarction (NSTEMI)    07/ 2015  S/P  DES X3   History of palpitations    Hyperlipidemia    Hypertension    Insomnia 12/08/2018   x many years   Myocardial infarction (HCC) 2015   NSTEMI- 3 stents    OSA (obstructive sleep apnea)    cpap intolerant   Pneumonia due to COVID-19 virus    Right knee meniscal tear    S/P drug eluting coronary stent placement    07/ 2015  x3  to mLCFX, OM1, mLAD   Sinus bradycardia    Sleep apnea    no cpap    Past Surgical History:  Procedure Laterality Date   CATARACT EXTRACTION, BILATERAL     COLONOSCOPY     >10 yrs ago-polyps per pt- we have no report    EXTRACORPOREAL SHOCK WAVE LITHOTRIPSY  x2 last one 2005   EXTRACORPOREAL SHOCK WAVE LITHOTRIPSY Right 05/28/2021   Procedure: EXTRACORPOREAL SHOCK WAVE LITHOTRIPSY (ESWL);  Surgeon: Milderd Meager., MD;  Location: [facility];  Service: Urology;  Laterality: Right;   KNEE ARTHROSCOPY Right 05/02/2015   Procedure: RIGHT KNEE ARTHROSCOPY WITH medial meniscal DEBRIDEMENT ;  Surgeon: Ollen Gross, MD;  Location: [facility];  Service: Orthopedics;  Laterality: Right;   LEFT HEART CATHETERIZATION WITH CORONARY ANGIOGRAM N/A 08/05/2013   Procedure: LEFT HEART CATHETERIZATION WITH CORONARY ANGIOGRAM;  Surgeon: Micheline Chapman, MD;  Location: [facility] CATH [facility];  Service: Cardiovascular;  Laterality: N/A;  Promus DES's to  mLCFx,  OM1,  mLAD (total 3)/   mRCA 30%,  dLM  20-30%,  preserved LVSF, ef 55-65%   LITHOTRIPSY     LUMBAR SPINE SURGERY  x3  last one 1980's   SHOULDER ARTHROSCOPY Right 2013   TONSILLECTOMY     FAMILY HISTORY Family History  Problem Relation Age of Onset   Coronary artery disease Mother        CABG   CAD Sister    Bone cancer Sister    Alzheimer's disease Father    Aneurysm Brother        brain   Heart failure Sister    Bone cancer Sister    Colon cancer Neg Hx    Colon polyps Neg Hx     Esophageal cancer Neg Hx    Rectal cancer Neg Hx    Stomach cancer Neg Hx    SOCIAL HISTORY Social History   Tobacco Use   Smoking status: Former    Current packs/day: 0.00    Types: Cigarettes    Start date: 04/26/1965    Quit date: 04/27/1966    Years since quitting: 56.5   Smokeless tobacco: Never  Vaping Use   Vaping status: Never Used  Substance Use Topics   Alcohol use: Yes    Comment: occasional   Drug use: No       OPHTHALMIC EXAM:  Base Eye Exam     Visual Acuity (Snellen - Linear)       Right Left   Dist [place] 20/25 -3 20/25 -3   Dist ph [place] NI NI         Tonometry (Tonopen, 1:46 PM)       Right Left   Pressure 12 14         Pupils       Pupils Dark Light Shape React APD   Right PERRL 3 2 Round Sluggish None   Left PERRL 3 2 Round Sluggish None         Visual Fields       Left Right    Full Full         Extraocular Movement       Right Left    Full, Ortho Full, Ortho         Neuro/Psych     Oriented x3: Yes   Mood/Affect: Normal         Dilation     Both eyes: 2.5% Phenylephrine @ 1:45 PM           Slit Lamp and Fundus Exam     External Exam       Right Left   External Normal Normal         Slit Lamp Exam       Right Left   Lids/Lashes Dermatochalasis - upper lid Dermatochalasis - upper lid   Conjunctiva/Sclera Pinguecula Pinguecula   Cornea Arcus, Debris in tear film Arcus, Debris in tear film   Anterior Chamber Deep and clear Deep and clear   Iris Round and moderately dilated Round and moderately dilated   Lens PC IOL in good postition 2-3+ Nuclear sclerosis, 2-3+ Cortical cataract   Anterior Vitreous Vitreous syneresis Normal         Fundus Exam       Right Left   Disc Pink and sharp, +fibrosis Pink and sharp   C/D Ratio 0.1 0.2   Macula Blunted foveal reflex, ERM with preretinal fibrosis, punctate central drusen, RPE mottling Flat, Blunted foveal reflex, mild ERM, no heme, no edema   Vessels  Vascular attenuation, Tortuous, + fibrosis along IT arcades Vascular attenuation, Tortuous   Periphery Normal Normal           Refraction     Manifest Refraction       Sphere Cylinder Axis Dist VA   Right [place] Sphere  20/25-3   Left [place] +0.50 180 20/25-3           IMAGING AND PROCEDURES  Imaging and Procedures for 11/07/2022  OCT, Retina - OU - Both Eyes       Right Eye Quality was good. Central Foveal Thickness: 491. Progression has been stable. Findings include no IRF, no SRF, abnormal foveal contour, epiretinal membrane, macular pucker, preretinal fibrosis.   Left Eye Quality was good. Central Foveal Thickness: 339. Progression has worsened. Findings include normal foveal contour, no IRF, no SRF, epiretinal membrane, macular pucker, vitreomacular adhesion (Mild ERM with blunting of foveal contour and early pucker).   Notes *Images captured and stored on drive  Diagnosis / Impression:  OD: OS: Mild ERM with blunting of foveal contour and early pucker  Clinical management:  See below  Abbreviations: NFP - Normal foveal profile. CME - cystoid macular edema. PED - pigment epithelial detachment. IRF - intraretinal fluid. SRF - subretinal fluid. EZ - ellipsoid zone. ERM - epiretinal membrane. ORA - outer retinal atrophy. ORT - outer retinal tubulation. SRHM - subretinal hyper-reflective material. IRHM - intraretinal hyper-reflective material           ASSESSMENT/PLAN:   ICD-10-CM   1. Visual disturbance  H53.9 OCT, Retina - OU - Both Eyes    2. Epiretinal membrane (ERM), bilateral  H35.373     3. Essential hypertension  I10     4. Hypertensive retinopathy of both eyes  H35.033     5. Pseudophakia, right eye  Z96.1     6. 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 DFE, OCT  2. Epiretinal membrane, both eyes (OD.OS) - The natural history, anatomy, potential for loss of vision, and treatment options including vitrectomy techniques and the complications of endophthalmitis, retinal detachment, vitreous hemorrhage, cataract progression and permanent vision loss discussed with the patient. - mild ERM OU - BCVA 20/25 OU - asymptomatic, no metamorphopsia - no indication for surgery at this time - monitor for now - f/u 3 mos -- DFE/OCT   3,4. Hypertensive retinopathy OU - discussed importance of tight BP control - monitor   5. Pseudophakia OD  - s/p CE/IOL w/ Dr. Elmer Picker  - IOL in good position, doing well  - monitor   6. Mixed Cataract OS - The symptoms of cataract, surgical options, and treatments and risks were discussed with patient. - discussed diagnosis and progression - monitor   Ophthalmic Meds Ordered this visit:  No orders of the defined types were placed in this encounter.    No follow-ups on file.  There are no Patient Instructions on file for this visit.  Explained the diagnoses, plan, and follow up with the patient and they expressed understanding.  Patient expressed understanding of the importance of proper follow up care.   This document serves as a record of services personally performed by Karie Chimera, MD, PhD. It was created on their behalf by Glee Arvin. Manson Passey, OA an ophthalmic technician. The creation of this record is the provider's dictation and/or activities during the visit.    Electronically signed by: Glee Arvin. Manson Passey, OA 11/07/22 2:13 PM  This document serves as a record of services personally performed by Karie Chimera, MD, PhD. It was created on their behalf by Charlette Caffey, COT an ophthalmic technician. The creation of this record is the provider's dictation and/or activities during the visit.    Electronically signed by:  Charlette Caffey, COT  11/07/22 2:13 PM  Karie Chimera, M.D., Ph.D. Diseases & Surgery of the Retina and Vitreous [facility] 11/07/2022  Abbreviations: M myopia (nearsighted); A astigmatism; H hyperopia (farsighted); P presbyopia; Mrx spectacle prescription;  CTL contact lenses; OD right eye; OS left eye; OU both eyes  XT exotropia; ET esotropia; PEK punctate epithelial keratitis; PEE punctate epithelial erosions; DES dry eye syndrome; MGD meibomian gland dysfunction; ATs artificial tears; PFAT's preservative free artificial tears; NSC nuclear sclerotic cataract; PSC posterior subcapsular cataract; ERM epi-retinal membrane; PVD posterior vitreous detachment; RD  retinal detachment; DM diabetes mellitus; DR diabetic retinopathy; NPDR non-proliferative diabetic retinopathy; PDR proliferative diabetic retinopathy; CSME clinically significant macular edema; DME diabetic macular edema; dbh dot blot hemorrhages; CWS cotton wool spot; POAG primary open angle glaucoma; C/D cup-to-disc ratio; HVF humphrey visual field; GVF goldmann visual field; OCT optical coherence tomography; IOP intraocular pressure; BRVO Branch retinal vein occlusion; CRVO central retinal vein occlusion; CRAO central retinal artery occlusion; BRAO branch retinal artery occlusion; RT retinal tear; SB scleral buckle; PPV pars plana vitrectomy; VH Vitreous hemorrhage; PRP panretinal laser photocoagulation; IVK intravitreal kenalog; VMT vitreomacular traction; MH Macular hole;  NVD neovascularization of the disc; NVE neovascularization elsewhere; AREDS age related eye disease study; ARMD age related macular degeneration; POAG primary open angle glaucoma; EBMD epithelial/anterior basement membrane dystrophy; ACIOL anterior chamber intraocular lens; IOL intraocular lens; PCIOL posterior chamber intraocular lens; Phaco/IOL phacoemulsification with intraocular lens placement; PRK photorefractive keratectomy; LASIK laser assisted in situ  keratomileusis; HTN hypertension; DM diabetes mellitus; COPD chronic obstructive pulmonary disease

## 2022-11-09 ENCOUNTER — Encounter (INDEPENDENT_AMBULATORY_CARE_PROVIDER_SITE_OTHER): Payer: Self-pay | Admitting: Ophthalmology

## 2022-11-25 DIAGNOSIS — M25521 Pain in right elbow: Secondary | ICD-10-CM | POA: Diagnosis not present

## 2022-12-11 NOTE — Progress Notes (Signed)
[facility] - Clinic Note  12/17/2022   CHIEF COMPLAINT Patient presents for Retina Follow Up  HISTORY OF PRESENT ILLNESS: Ronald Berger is a 77 y.o. male who presents to the clinic today for:  HPI     Retina Follow Up   Patient presents with  Other.  In both eyes.  Duration of 6 weeks.  I, the attending physician,  performed the HPI with the patient and updated documentation appropriately.        Comments   Patient states that his vision is the same. He has not had another episode of tunnel vision. He is not using eye drops.       Last edited by Rennis Chris, MD on 12/17/2022 12:04 PM.    Pt states he has not had any instances of tunnel vision since he was here last  Referring physician: Junie Spencer, FNP [address] [place],  [place] [address]  HISTORICAL INFORMATION:  Selected notes from the MEDICAL RECORD NUMBER [facility] pt here for full loss of VF LEE:  Ocular Hx- PMH-   CURRENT MEDICATIONS: No current outpatient medications on file. (Ophthalmic Drugs)   No current facility-administered medications for this visit. (Ophthalmic Drugs)   Current Outpatient Medications (Other)  Medication Sig   albuterol (VENTOLIN HFA) 108 (90 Base) MCG/ACT inhaler INHALE 2 PUFFS INTO THE LUNGS EVERY 6  HOURS AS NEEDED FOR WHEEZING OR SHORTNESS OF BREATH.   alfuzosin (UROXATRAL) 10 MG 24 hr tablet Take 1 tablet (10 mg total) by mouth daily with breakfast. Please stop Silodosin 8mg  and start taking Alfuzosin   aspirin 81 MG chewable tablet Chew 1 tablet (81 mg total) by mouth daily.   atorvastatin (LIPITOR) 40 MG tablet TAKE 1 TABLET EVERY DAY (NEEDS TO BE SEEN BEFORE NEXT REFILL)   diclofenac (VOLTAREN) 75 MG EC tablet TAKE 1 TABLET TWICE DAILY   diclofenac Sodium (VOLTAREN) 1 % GEL Apply 2 g topically 4 (four) times daily.   EPINEPHRINE 0.3 mg/0.3 mL IJ SOAJ injection INJECT 0.3 MG INTO THE MUSCLE AS NEEDED FOR ANAPHYLAXIS.   fluticasone (FLONASE) 50  MCG/ACT nasal spray USE 2 SPRAYS IN EACH NOSTRIL EVERY DAY   fluticasone-salmeterol (ADVAIR) 250-50 MCG/ACT AEPB Inhale 1 puff into the lungs in the morning and at bedtime.   KRILL OIL PO Take by mouth.   levocetirizine (XYZAL) 5 MG tablet TAKE 1 TABLET EVERY EVENING   methocarbamol (ROBAXIN) 500 MG tablet TAKE 1 TABLET EVERY 8 HOURS AS NEEDED FOR MUSCLE SPASM(S)   metoprolol tartrate (LOPRESSOR) 25 MG tablet TAKE 1/2 TABLET TWICE DAILY   Misc Natural Products (NEURIVA PO) Take by mouth.   montelukast (SINGULAIR) 10 MG tablet TAKE 1 TABLET AT BEDTIME   Multiple Vitamins-Minerals (EMERGEN-C BLUE) PACK Take 1 each by mouth daily.   nitroGLYCERIN (NITROSTAT) 0.4 MG SL tablet PLACE 1 TABLET (0.4 MG TOTAL) UNDER THE TONGUE EVERY 5 (FIVE) MINUTES AS NEEDED FOR CHEST PAIN.   omeprazole (PRILOSEC) 20 MG capsule TAKE 1 CAPSULE EVERY EVENING   traZODone (DESYREL) 50 MG tablet TAKE 1 TABLET (50 MG TOTAL) BY MOUTH AT BEDTIME   No current facility-administered medications for this visit. (Other)   REVIEW OF SYSTEMS: ROS   Positive for: Cardiovascular, Allergic/Imm Last edited by Charlette Caffey, COT on 12/17/2022  8:11 AM.      ALLERGIES Allergies  Allergen Reactions   Bee Venom Shortness Of Breath   Oxycodone Nausea And Vomiting   Percocet [Oxycodone-Acetaminophen] Nausea And  Vomiting   PAST MEDICAL HISTORY Past Medical History:  Diagnosis Date   Allergic rhinitis    Allergy    Asthma due to environmental allergies    a.  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 NSTEMI 07/2013 - DES to mLCx, DES to prox OM1, DES to mLAD with nonobstructive RCA stenosis, EF 55-65%.   Cataract    Chronic kidney disease    kidney stones    Dyslipidemia    Family history of bone cancer    GERD (gastroesophageal reflux disease)    History of kidney stones    History of  non-ST elevation myocardial infarction (NSTEMI)    07/ 2015  S/P  DES X3   History of palpitations    Hyperlipidemia    Hypertension    Insomnia 12/08/2018   x many years   Myocardial infarction (HCC) 2015   NSTEMI- 3 stents    OSA (obstructive sleep apnea)    cpap intolerant   Pneumonia due to COVID-19 virus    Right knee meniscal tear    S/P drug eluting coronary stent placement    07/ 2015  x3  to mLCFX, OM1, mLAD   Sinus bradycardia    Sleep apnea    no cpap    Past Surgical History:  Procedure Laterality Date   CATARACT EXTRACTION, BILATERAL     COLONOSCOPY     >10 yrs ago-polyps per pt- we have no report    EXTRACORPOREAL SHOCK WAVE LITHOTRIPSY  x2 last one 2005   EXTRACORPOREAL SHOCK WAVE LITHOTRIPSY Right 05/28/2021   Procedure: EXTRACORPOREAL SHOCK WAVE LITHOTRIPSY (ESWL);  Surgeon: Milderd Meager., MD;  Location: [facility];  Service: Urology;  Laterality: Right;   KNEE ARTHROSCOPY Right 05/02/2015   Procedure: RIGHT KNEE ARTHROSCOPY WITH medial meniscal DEBRIDEMENT ;  Surgeon: Ollen Gross, MD;  Location: [facility];  Service: Orthopedics;  Laterality: Right;   LEFT HEART CATHETERIZATION WITH CORONARY ANGIOGRAM N/A 08/05/2013   Procedure: LEFT HEART CATHETERIZATION WITH CORONARY ANGIOGRAM;  Surgeon: Micheline Chapman, MD;  Location: [facility] CATH LAB;  Service: Cardiovascular;  Laterality: N/A;  Promus DES's to  mLCFx,  OM1,  mLAD (total 3)/   mRCA 30%,  dLM  20-30%,  preserved LVSF, ef 55-65%   LITHOTRIPSY     LUMBAR SPINE SURGERY  x3  last one 1980's   SHOULDER ARTHROSCOPY Right 2013   TONSILLECTOMY     FAMILY HISTORY Family History  Problem Relation Age of Onset   Coronary artery disease Mother        CABG   CAD Sister    Bone cancer Sister    Alzheimer's disease Father    Aneurysm Brother        brain   Heart failure Sister    Bone cancer Sister    Colon cancer Neg Hx    Colon polyps Neg Hx    Esophageal cancer Neg Hx    Rectal cancer Neg Hx     Stomach cancer Neg Hx    SOCIAL HISTORY Social History   Tobacco Use   Smoking status: Former    Current packs/day: 0.00    Types: Cigarettes    Start date: 04/26/1965    Quit date: 04/27/1966    Years since quitting: 56.6   Smokeless tobacco: Never  Vaping Use   Vaping status: Never  Used  Substance Use Topics   Alcohol use: Yes    Comment: occasional   Drug use: No       OPHTHALMIC EXAM:  Base Eye Exam     Visual Acuity (Snellen - Linear)       Right Left   Dist [place] 20/20 20/25   Dist ph [place]  NI         Tonometry (Tonopen, 8:15 AM)       Right Left   Pressure 12 14         Pupils       Dark Light Shape React APD   Right 3 2 Round Brisk None   Left 3 2 Round Brisk None         Visual Fields       Left Right    Full Full         Extraocular Movement       Right Left    Full, Ortho Full, Ortho         Neuro/Psych     Oriented x3: Yes   Mood/Affect: Normal         Dilation     Both eyes: 2.5% Phenylephrine @ 8:12 AM           Slit Lamp and Fundus Exam     External Exam       Right Left   External Normal Normal         Slit Lamp Exam       Right Left   Lids/Lashes Dermatochalasis - upper lid Dermatochalasis - upper lid   Conjunctiva/Sclera nasal pingeucula White and quiet   Cornea Arcus, trace tear film debris Arcus, tear film debris   Anterior Chamber Deep and clear Deep and clear   Iris Round and moderately dilated Round and dilated   Lens PC IOL in good postition 2-3+ Nuclear sclerosis with brunescence, 2-3+ Cortical cataract   Anterior Vitreous Vitreous syneresis Vitreous syneresis         Fundus Exam       Right Left   Disc Pink and sharp, +fibrosis Pink and sharp   C/D Ratio 0.1 0.2   Macula Blunted foveal reflex, ERM with preretinal fibrosis, punctate central drusen, RPE mottling Flat, Blunted foveal reflex, mild ERM, no heme or edema   Vessels attenuated, Tortuous attenuated, Tortuous   Periphery  Attached, No heme Attached, No heme           IMAGING AND PROCEDURES  Imaging and Procedures for 12/17/2022  OCT, Retina - OU - Both Eyes       Right Eye Quality was good. Central Foveal Thickness: 496. Progression has been stable. Findings include no IRF, no SRF, abnormal foveal contour, epiretinal membrane, macular pucker, preretinal fibrosis (ERM with pucker and thickening ).   Left Eye Quality was good. Central Foveal Thickness: 339. Progression has been stable. Findings include no IRF, no SRF, abnormal foveal contour, epiretinal membrane, macular pucker, vitreomacular adhesion (Mild ERM with blunting of foveal contour and early pucker).   Notes *Images captured and stored on drive  Diagnosis / Impression:  OD: ERM with pucker and thickening  OS: Mild ERM with blunting of foveal contour and early pucker  Clinical management:  See below  Abbreviations: NFP - Normal foveal profile. CME - cystoid macular edema. PED - pigment epithelial detachment. IRF - intraretinal fluid. SRF - subretinal fluid. EZ - ellipsoid zone. ERM - epiretinal membrane. ORA - outer retinal atrophy. ORT -  outer retinal tubulation. SRHM - subretinal hyper-reflective material. IRHM - intraretinal hyper-reflective material            ASSESSMENT/PLAN:   ICD-10-CM   1. Visual disturbance  H53.9     2. Epiretinal membrane (ERM), bilateral  H35.373 OCT, Retina - OU - Both Eyes    3. Essential hypertension  I10     4. Hypertensive retinopathy of both eyes  H35.033     5. Pseudophakia, right eye  Z96.1     6. 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 DFE, OCT  2. Epiretinal membrane, both eyes (OD>OS) - OD w/ pucker and thickening - OS w/ mild blunting of foveal contour and early pucker - BCVA OD 20/20, OS 20/25 - pt reports minimal metamorphopsia - no indication for surgery at this time - monitor for now  3,4. Hypertensive retinopathy OU - discussed importance of tight BP control - monitor   5. Pseudophakia OD  - s/p CE/IOL w/ Dr. Elmer Picker  - IOL in good position, doing well  - monitor   6. Mixed Cataract OS - The symptoms of cataract, surgical options, and treatments and risks were discussed with patient. - discussed diagnosis and progression - monitor   Ophthalmic Meds Ordered this visit:  No orders of the defined types were placed in this encounter.    Return in about 6 months (around 06/16/2023) for f/u ERM OU, DFE, OCT.  There are no Patient Instructions on file for this visit.  Explained the diagnoses, plan, and follow up with the patient and they expressed understanding.  Patient expressed understanding of the importance of proper follow up care.   This document serves as a record of services personally performed by Karie Chimera, MD, PhD. It was created on their behalf by De Blanch, an ophthalmic technician. The creation of this record is the provider's dictation and/or activities during the visit.    Electronically signed by: De Blanch, OA, 12/17/22  12:05 PM  This document serves as a record of services personally performed by Karie Chimera, MD, PhD. It was created on their behalf by Glee Arvin. Manson Passey, OA an ophthalmic technician. The creation of this record is the provider's dictation and/or activities during the visit.    Electronically signed by: Glee Arvin. Manson Passey, OA 12/17/22 12:05 PM  Karie Chimera, M.D., Ph.D. Diseases & Surgery of the Retina and Vitreous [facility] 12/17/2022  I have reviewed the above documentation for  accuracy and completeness, and I agree with the above. Karie Chimera, M.D., Ph.D. 12/17/22 12:08 PM   Abbreviations: M myopia (nearsighted); A astigmatism; H hyperopia (farsighted); P presbyopia; Mrx spectacle prescription;  CTL contact lenses; OD right eye; OS left eye; OU both eyes  XT exotropia; ET esotropia; PEK punctate epithelial keratitis; PEE punctate epithelial erosions; DES dry eye syndrome; MGD meibomian gland dysfunction; ATs artificial tears; PFAT's preservative free artificial tears; NSC nuclear sclerotic cataract; PSC posterior subcapsular cataract; ERM epi-retinal membrane; PVD posterior vitreous detachment; RD retinal detachment; DM diabetes mellitus; DR diabetic retinopathy; NPDR non-proliferative diabetic retinopathy; PDR proliferative diabetic retinopathy;  CSME clinically significant macular edema; DME diabetic macular edema; dbh dot blot hemorrhages; CWS cotton wool spot; POAG primary open angle glaucoma; C/D cup-to-disc ratio; HVF humphrey visual field; GVF goldmann visual field; OCT optical coherence tomography; IOP intraocular pressure; BRVO Branch retinal vein occlusion; CRVO central retinal vein occlusion; CRAO central retinal artery occlusion; BRAO branch retinal artery occlusion; RT retinal tear; SB scleral buckle; PPV pars plana vitrectomy; VH Vitreous hemorrhage; PRP panretinal laser photocoagulation; IVK intravitreal kenalog; VMT vitreomacular traction; MH Macular hole;  NVD neovascularization of the disc; NVE neovascularization elsewhere; AREDS age related eye disease study; ARMD age related macular degeneration; POAG primary open angle glaucoma; EBMD epithelial/anterior basement membrane dystrophy; ACIOL anterior chamber intraocular lens; IOL intraocular lens; PCIOL posterior chamber intraocular lens; Phaco/IOL phacoemulsification with intraocular lens placement; PRK photorefractive keratectomy; LASIK laser assisted in situ keratomileusis; HTN hypertension; DM diabetes  mellitus; COPD chronic obstructive pulmonary disease

## 2022-12-14 ENCOUNTER — Other Ambulatory Visit: Payer: Self-pay | Admitting: Family

## 2022-12-14 DIAGNOSIS — R002 Palpitations: Secondary | ICD-10-CM

## 2022-12-15 NOTE — Telephone Encounter (Signed)
[facility] in Dec, [place] RF sent to mail order pharmacy

## 2022-12-15 NOTE — Telephone Encounter (Signed)
Appt scheduled for 01/15/2023, pt will call back if he needs a refill

## 2022-12-17 ENCOUNTER — Ambulatory Visit (INDEPENDENT_AMBULATORY_CARE_PROVIDER_SITE_OTHER): Payer: Medicare HMO | Admitting: Ophthalmology

## 2022-12-17 ENCOUNTER — Encounter (INDEPENDENT_AMBULATORY_CARE_PROVIDER_SITE_OTHER): Payer: Self-pay | Admitting: Ophthalmology

## 2022-12-17 DIAGNOSIS — H539 Unspecified visual disturbance: Secondary | ICD-10-CM

## 2022-12-17 DIAGNOSIS — H35033 Hypertensive retinopathy, bilateral: Secondary | ICD-10-CM | POA: Diagnosis not present

## 2022-12-17 DIAGNOSIS — H25012 Cortical age-related cataract, left eye: Secondary | ICD-10-CM

## 2022-12-17 DIAGNOSIS — H35373 Puckering of macula, bilateral: Secondary | ICD-10-CM | POA: Diagnosis not present

## 2022-12-17 DIAGNOSIS — Z961 Presence of intraocular lens: Secondary | ICD-10-CM | POA: Diagnosis not present

## 2022-12-17 DIAGNOSIS — I10 Essential (primary) hypertension: Secondary | ICD-10-CM | POA: Diagnosis not present

## 2022-12-22 ENCOUNTER — Other Ambulatory Visit: Payer: Self-pay | Admitting: Urology

## 2022-12-22 DIAGNOSIS — N138 Other obstructive and reflux uropathy: Secondary | ICD-10-CM

## 2023-01-01 ENCOUNTER — Ambulatory Visit: Payer: Medicare HMO | Attending: Internal Medicine | Admitting: Internal Medicine

## 2023-01-01 VITALS — BP 120/64 | HR 53 | Ht 69.0 in | Wt 232.0 lb

## 2023-01-01 DIAGNOSIS — I10 Essential (primary) hypertension: Secondary | ICD-10-CM

## 2023-01-01 DIAGNOSIS — E785 Hyperlipidemia, unspecified: Secondary | ICD-10-CM | POA: Diagnosis not present

## 2023-01-01 DIAGNOSIS — I2510 Atherosclerotic heart disease of native coronary artery without angina pectoris: Secondary | ICD-10-CM

## 2023-01-01 DIAGNOSIS — I252 Old myocardial infarction: Secondary | ICD-10-CM

## 2023-01-01 MED ORDER — ATORVASTATIN CALCIUM 40 MG PO TABS: 40.0000 mg | ORAL_TABLET | Freq: Every day | ORAL | 3 refills | Status: DC

## 2023-01-01 NOTE — Progress Notes (Signed)
Marland Kitchen     OFFICE NOTE  Chief Complaint:  No complaints  Primary Care Physician: Junie Spencer, FNP  HPI:  Ronald Berger is a 77 y/o M with history of CAD (NSTEMI 07/2013 s/p DES to mLCx/OM1/mLAD with nonobstructive RCA disease, normal EF), HLD, and GERD. He has done well since his PCI. He has been participating regularly in cardiac rehab without any recent exertional symptoms. He has been compliant with medication. He was recently seen in the ER for sharp chest discomfort slightly to the left of his sternum. It does not radiate. He denies any associated SOB, nausea, vomiting, diaphoresis, palpitations, near-syncope, syncope or bleeding. The pain is not similar to his prior angina (that was more pressure-like and with associated sx). It is worse with palpation. It is not usually worse with exertion but has been on a few occasions. Pain is not worse with inspiration or recumbency. He took Tums and several SL NTG without any change in discomfort. The discomfort has tended to come on in the morning, last all day long, then resolve at night when lying down. He walks his dog 3-4 times a day and wonders if walking her in the snow (which was more difficult) could've led to some shoulder straining. No recent travel, surgery, bedrest, LEE, or orthopnea. His wife is a Engineer, civil (consulting) and urged him to get checked out. In the ED, VSS except mild sinus bradycardia, not tachycardic, tachypneic or hypoxic. Normal CBC, BMET, troponin x 1. CXR no edema or consolidation. He was admitted for observation and treated with IV ketorolac with improvement in symptoms. He remained CP free. Troponins remained negative x 5 and labs remained stable. TSH 4.2. He did have some one episode of sinus bradycardia overnight in the low 40's while sleeping (asymptomatic). He does admit to a h/o OSA but has not been able to tolerate CPAP. He denies any dizziness, near-syncope, syncope or any symptoms of bradycardia. LDL remains at goal at 40.  Ronald Berger returns today for follow-up. He denies any chest pain or worsening shortness of breath. He is resume cardiac rehabilitation and doing well. He will need to continue on Prasugrel at least until this summer.  I saw Ronald Berger in the office today. Recently he called in as he woke up one morning and was noted to have a low heart rate and some palpitations. Apparently his wife listened to him who is a Engineer, civil (consulting) and thought he may have A. fib. Ultimately came in for an EKG which was normal. He has not had any reoccurrence. We contemplated a monitor however since he's not had recurrence at this point I don't think there is evidence to monitor him as he has another episode. Were also seeing him today as it's been 1 year since his prior stent. I believe at this point he could come off of Effient. He is not having any chest pain and is physically active.  08/29/2015  Ronald Berger was seen today in the office in follow-up. Over the past year he denies any cardiac complaints such as chest pain or worsening shortness of breath. Unfortunately recent knee injury and underwent the surgery which was arthroscopic in April. He still recovering from that. Ronald Berger. Despite this recent cholesterol profile in June showed total cholesterol 111, triglycerides 84, HDL 53 and LDL 41. This represents good control. Heart rate remains slow around 50 on low-dose metoprolol, but he is asymptomatic with this. He does not appreciate any significant palpitations  although they are picked up by his wife is a Engineer, civil (consulting).  08/28/2016  Ronald Berger returns today for follow-up. Is been exactly one year since I last saw him. He denies any chest pain or worsening shortness of breath. 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 Cholesterol is very well controlled. Blood pressure is at goal. He denies any recurrent chest pain.  01/31/2019  Ronald Berger is seen today in follow-up.  He is doing well  denies any chest pain or worsening shortness of breath.  He does report some mild increase in fatigue but he says this has been slowly developing.  He also thinks it may have been because he took 5 weeks off of work and is now [place].  He is pending eye surgery and also is having elective colonoscopy in the near future.  I have no hesitation for him to come off his aspirin if necessary.  Labs a month ago showed total cholesterol 134, triglycerides 184, HDL 50 and LDL 54, however this was nonfasting.  04/05/2020  Ronald Berger is seen today in follow-up.  Overall he is doing very well.  He is now getting back to more full-time work at the [employer].  Unfortunately had COVID-19 after being vaccinated boosted in January of this year.  Since then he has had issues with asthma and difficulty breathing.  He was quite hypoxic required hospitalization and numerous medications including probably steroids and remdesivir.  His blood sugars were driven up by this and A1c recently was 6.6 but is not yet on therapy.  EKG today shows a sinus bradycardia.  He denies any anginal symptoms.  01/01/2023  Ronald Berger is seen today in follow-up.  Overall he seems to be doing well.  He continues to work for the [employer].  I said although he has aches and pains he enjoys staying active.  He walks quite a bit.  His blood pressure is excellent today.  He said no chest pain or any cardiac symptoms.  Is now almost 10 years since he had stenting.  Lipids were done in July.  Total cholesterol 127, HDL 35, triglycerides 165 and LDL 64.  PMHx:  Past Medical History:  Diagnosis Date   Allergic rhinitis    Allergy    Asthma due to environmental allergies    a.  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   Bilateral carpal tunnel syndrome 12/08/2018   CAD, multiple vessel CARDIOLOGIST-  DR Chaze Hruska   a. NSTEMI 07/2013 - DES to mLCx, DES to prox OM1, DES to mLAD with  nonobstructive RCA stenosis, EF 55-65%.   Cataract    Chronic kidney disease    kidney stones    Dyslipidemia    Family history of bone cancer    GERD (gastroesophageal reflux disease)    History of kidney stones    History of non-ST elevation myocardial infarction (NSTEMI)    07/ 2015  S/P  DES X3   History of palpitations    Hyperlipidemia    Hypertension    Insomnia 12/08/2018   x many years   Myocardial infarction (HCC) 2015   NSTEMI- 3 stents    OSA (obstructive sleep apnea)    cpap intolerant   Pneumonia due to COVID-19 virus    Right knee meniscal tear    S/P drug eluting coronary stent placement    07/ 2015  x3  to [facility],  OM1, mLAD   Sinus bradycardia    Sleep apnea    no cpap     Past Surgical History:  Procedure Laterality Date   CATARACT EXTRACTION, BILATERAL     COLONOSCOPY     >10 yrs ago-polyps per pt- we have no report    EXTRACORPOREAL SHOCK WAVE LITHOTRIPSY  x2 last one 2005   EXTRACORPOREAL SHOCK WAVE LITHOTRIPSY Right 05/28/2021   Procedure: EXTRACORPOREAL SHOCK WAVE LITHOTRIPSY (ESWL);  Surgeon: Milderd Meager., MD;  Location: [facility];  Service: Urology;  Laterality: Right;   KNEE ARTHROSCOPY Right 05/02/2015   Procedure: RIGHT KNEE ARTHROSCOPY WITH medial meniscal DEBRIDEMENT ;  Surgeon: Ollen Gross, MD;  [facility];  Service: Orthopedics;  Laterality: Right;   LEFT HEART CATHETERIZATION WITH CORONARY ANGIOGRAM N/A 08/05/2013   Procedure: LEFT HEART CATHETERIZATION WITH CORONARY ANGIOGRAM;  Surgeon: Micheline Chapman, MD;  Location: [facility] CATH LAB;  Service: Cardiovascular;  Laterality: N/A;  Promus DES's to  mLCFx,  OM1,  mLAD (total 3)/   mRCA 30%,  dLM  20-30%,  preserved LVSF, ef 55-65%   LITHOTRIPSY     LUMBAR SPINE SURGERY  x3  last one 1980's   SHOULDER ARTHROSCOPY Right 2013   TONSILLECTOMY      FAMHx:  Family History  Problem Relation Age of Onset   Coronary artery disease Mother        CABG   CAD Sister     Bone cancer Sister    Alzheimer's disease Father    Aneurysm Brother        brain   Heart failure Sister    Bone cancer Sister    Colon cancer Neg Hx    Colon polyps Neg Hx    Esophageal cancer Neg Hx    Rectal cancer Neg Hx    Stomach cancer Neg Hx     SOCHx:   reports that he quit smoking about 56 years ago. His smoking use included cigarettes. He started smoking about 57 years ago. He has never used smokeless tobacco. He reports current alcohol use. He reports that he does not use drugs.  ALLERGIES:  Allergies  Allergen Reactions   Bee Venom Shortness Of Breath   Oxycodone Nausea And Vomiting   Percocet [Oxycodone-Acetaminophen] Nausea And Vomiting    ROS: Pertinent items noted in HPI and remainder of comprehensive ROS otherwise negative.  HOME MEDS: Current Outpatient Medications  Medication Sig Dispense Refill   albuterol (VENTOLIN HFA) 108 (90 Base) MCG/ACT inhaler INHALE 2 PUFFS INTO THE LUNGS EVERY 6  HOURS AS NEEDED FOR WHEEZING OR SHORTNESS OF BREATH. 20.1 g 1   alfuzosin (UROXATRAL) 10 MG 24 hr tablet TAKE 1 TABLET DAILY WITH BREAKFAST. PLEASE STOP SILODOSIN 8MG  AND START TAKING ALFUZOSIN 90 tablet 3   aspirin 81 MG chewable tablet Chew 1 tablet (81 mg total) by mouth daily.     atorvastatin (LIPITOR) 40 MG tablet TAKE 1 TABLET EVERY DAY (NEEDS TO BE SEEN BEFORE NEXT REFILL) 90 tablet 0   diclofenac (VOLTAREN) 75 MG EC tablet TAKE 1 TABLET TWICE DAILY 180 tablet 3   diclofenac Sodium (VOLTAREN) 1 % GEL Apply 2 g topically 4 (four) times daily. 150 g 2   EPINEPHRINE 0.3 mg/0.3 mL IJ SOAJ injection INJECT 0.3 MG INTO THE MUSCLE AS NEEDED FOR ANAPHYLAXIS. 2 each 2   fluticasone (FLONASE) 50 MCG/ACT nasal spray USE 2 SPRAYS IN EACH NOSTRIL EVERY DAY 48 g 3   fluticasone-salmeterol (ADVAIR) 250-50 MCG/ACT AEPB  Inhale 1 puff into the lungs in the morning and at bedtime. 60 each 5   KRILL OIL PO Take by mouth.     levocetirizine (XYZAL) 5 MG tablet TAKE 1 TABLET EVERY  EVENING 90 tablet 3   methocarbamol (ROBAXIN) 500 MG tablet TAKE 1 TABLET EVERY 8 HOURS AS NEEDED FOR MUSCLE SPASM(S) 180 tablet 5   metoprolol tartrate (LOPRESSOR) 25 MG tablet TAKE 1/2 TABLET TWICE DAILY 90 tablet 0   Misc Natural Products (NEURIVA PO) Take by mouth.     montelukast (SINGULAIR) 10 MG tablet TAKE 1 TABLET AT BEDTIME 90 tablet 3   Multiple Vitamins-Minerals (EMERGEN-C BLUE) PACK Take 1 each by mouth daily.     nitroGLYCERIN (NITROSTAT) 0.4 MG SL tablet PLACE 1 TABLET (0.4 MG TOTAL) UNDER THE TONGUE EVERY 5 (FIVE) MINUTES AS NEEDED FOR CHEST PAIN. 25 tablet 2   omeprazole (PRILOSEC) 20 MG capsule TAKE 1 CAPSULE EVERY EVENING 90 capsule 1   traZODone (DESYREL) 50 MG tablet TAKE 1 TABLET (50 MG TOTAL) BY MOUTH AT BEDTIME 90 tablet 3   No current facility-administered medications for this visit.    LABS/IMAGING: No results found for this or any previous visit (from the past 48 hours). No results found.  VITALS: BP 120/64 (BP Location: Right Arm, Patient Position: Sitting, Cuff Size: Large)   Pulse (!) 53   Ht 5\' 9"  (1.753 m)   Wt 232 lb (105.2 kg)   SpO2 96%   BMI 34.26 kg/m   EXAM: General appearance: alert and no distress Neck: no carotid bruit and no JVD Lungs: clear to auscultation bilaterally Heart: regular rate and rhythm, S1, S2 normal, no murmur, click, rub or gallop Abdomen: soft, non-tender; bowel sounds normal; no masses,  no organomegaly Extremities: extremities normal, atraumatic, no cyanosis or edema Pulses: 2+ and symmetric Skin: Skin color, texture, turgor normal. No rashes or lesions Neurologic: Grossly normal Psych: Pleasant  EKG: EKG Interpretation Date/Time:  Thursday January 01 2023 08:44:05 EST Ventricular Rate:  49 PR Interval:  234 QRS Duration:  88 QT Interval:  430 QTC Calculation: 388 R Axis:   6  Text Interpretation: Sinus bradycardia with 1st degree A-V block When compared with ECG of 29-May-2021 13:03, Compared to previous  tracing the rate is slower Confirmed by Zoila Shutter [phone]) on 01/01/2023 8:55:00 AM    ASSESSMENT: Coronary artery disease with NSTEMI (PCI to mLCX, OM1 and mLAD-07/2013) Dyslipidemia GERD Palpitations - rare  PLAN: 1.   Ronald Berger is doing well without any symptoms Imuran remains very active still working for the [employer].  His cholesterol is very well-controlled.  He does not report any palpitations.  Blood pressure is excellent.  No changes to his medicines today.  Plan follow-up annually or sooner as necessary.  Chrystie Nose, MD, [facility], FACP  [place]  [facility]  Medical Director of the Advanced Lipid Disorders &  Cardiovascular Risk Reduction Clinic Diplomate of the American Board of Clinical Lipidology Attending Cardiologist  Direct Dial: [phone]  Fax: [phone]  Website:  [email or web address] Christion Leonhard 01/01/2023, 8:55 AM

## 2023-01-01 NOTE — Patient Instructions (Signed)
Medication Instructions:  NO CHANGES  *If you need a refill on your cardiac medications before your next appointment, please call your pharmacy*   Follow-Up: At [facility], you and your health needs are our priority.  As part of our continuing mission to provide you with exceptional heart care, we have created designated Provider Care Teams.  These Care Teams include your primary Cardiologist (physician) and Advanced Practice Providers (APPs -  Physician Assistants and Nurse Practitioners) who all work together to provide you with the care you need, when you need it.  We recommend signing up for the patient portal called "MyChart".  Sign up information is provided on this After Visit Summary.  MyChart is used to connect with patients for Virtual Visits (Telemedicine).  Patients are able to view lab/test results, encounter notes, upcoming appointments, etc.  Non-urgent messages can be sent to your provider as well.   To learn more about what you can do with MyChart, go to [email or web address].    Your next appointment:    12 months with Dr. Hilty  

## 2023-01-06 DIAGNOSIS — M25521 Pain in right elbow: Secondary | ICD-10-CM | POA: Diagnosis not present

## 2023-01-15 ENCOUNTER — Ambulatory Visit: Payer: Medicare HMO | Admitting: Family

## 2023-02-09 ENCOUNTER — Ambulatory Visit (INDEPENDENT_AMBULATORY_CARE_PROVIDER_SITE_OTHER): Payer: Medicare HMO | Admitting: Family

## 2023-02-09 ENCOUNTER — Encounter: Payer: Self-pay | Admitting: Family

## 2023-02-09 VITALS — BP 126/70 | HR 49 | Temp 97.9°F | Ht 69.0 in | Wt 239.0 lb

## 2023-02-09 DIAGNOSIS — Z91030 Bee allergy status: Secondary | ICD-10-CM

## 2023-02-09 DIAGNOSIS — R002 Palpitations: Secondary | ICD-10-CM | POA: Diagnosis not present

## 2023-02-09 DIAGNOSIS — G4733 Obstructive sleep apnea (adult) (pediatric): Secondary | ICD-10-CM

## 2023-02-09 DIAGNOSIS — I252 Old myocardial infarction: Secondary | ICD-10-CM

## 2023-02-09 DIAGNOSIS — R7303 Prediabetes: Secondary | ICD-10-CM | POA: Diagnosis not present

## 2023-02-09 DIAGNOSIS — J4530 Mild persistent asthma, uncomplicated: Secondary | ICD-10-CM | POA: Diagnosis not present

## 2023-02-09 DIAGNOSIS — J4540 Moderate persistent asthma, uncomplicated: Secondary | ICD-10-CM

## 2023-02-09 DIAGNOSIS — K219 Gastro-esophageal reflux disease without esophagitis: Secondary | ICD-10-CM | POA: Diagnosis not present

## 2023-02-09 DIAGNOSIS — J301 Allergic rhinitis due to pollen: Secondary | ICD-10-CM | POA: Diagnosis not present

## 2023-02-09 DIAGNOSIS — I10 Essential (primary) hypertension: Secondary | ICD-10-CM

## 2023-02-09 DIAGNOSIS — E785 Hyperlipidemia, unspecified: Secondary | ICD-10-CM

## 2023-02-09 DIAGNOSIS — F5101 Primary insomnia: Secondary | ICD-10-CM | POA: Diagnosis not present

## 2023-02-09 DIAGNOSIS — M542 Cervicalgia: Secondary | ICD-10-CM

## 2023-02-09 LAB — BAYER DCA HB A1C WAIVED: HB A1C (BAYER DCA - WAIVED): 5.5 % (ref 4.8–5.6)

## 2023-02-09 MED ORDER — DICLOFENAC SODIUM 1 % EX GEL: 2.0000 g | Freq: Four times a day (QID) | CUTANEOUS | 2 refills | Status: DC

## 2023-02-09 MED ORDER — METHOCARBAMOL 500 MG PO TABS: ORAL_TABLET | ORAL | 5 refills | Status: DC

## 2023-02-09 MED ORDER — NITROGLYCERIN 0.4 MG SL SUBL: 0.4000 mg | SUBLINGUAL_TABLET | SUBLINGUAL | 2 refills | Status: DC | PRN

## 2023-02-09 MED ORDER — TRAZODONE HCL 50 MG PO TABS: 50.0000 mg | ORAL_TABLET | Freq: Every day | ORAL | 3 refills | Status: DC

## 2023-02-09 MED ORDER — ALBUTEROL SULFATE HFA 108 (90 BASE) MCG/ACT IN AERS: 2.0000 | INHALATION_SPRAY | Freq: Four times a day (QID) | RESPIRATORY_TRACT | 1 refills | Status: DC | PRN

## 2023-02-09 MED ORDER — DICLOFENAC SODIUM 75 MG PO TBEC: 75.0000 mg | DELAYED_RELEASE_TABLET | Freq: Two times a day (BID) | ORAL | 3 refills | Status: DC

## 2023-02-09 MED ORDER — OMEPRAZOLE 20 MG PO CPDR: DELAYED_RELEASE_CAPSULE | ORAL | 1 refills | Status: DC

## 2023-02-09 MED ORDER — FLUTICASONE PROPIONATE 50 MCG/ACT NA SUSP: 2.0000 | Freq: Every day | NASAL | 3 refills | Status: DC

## 2023-02-09 MED ORDER — FLUTICASONE-SALMETEROL 250-50 MCG/ACT IN AEPB: 1.0000 | INHALATION_SPRAY | Freq: Two times a day (BID) | RESPIRATORY_TRACT | 5 refills | Status: DC

## 2023-02-09 MED ORDER — MONTELUKAST SODIUM 10 MG PO TABS: 10.0000 mg | ORAL_TABLET | Freq: Every day | ORAL | 3 refills | Status: DC

## 2023-02-09 MED ORDER — METOPROLOL TARTRATE 25 MG PO TABS: 12.5000 mg | ORAL_TABLET | Freq: Two times a day (BID) | ORAL | 0 refills | Status: DC

## 2023-02-09 MED ORDER — EPINEPHRINE 0.3 MG/0.3ML IJ SOAJ: 0.3000 mg | INTRAMUSCULAR | 2 refills | Status: DC | PRN

## 2023-02-09 NOTE — Patient Instructions (Signed)
Health Maintenance After Age 76 After age 76, you are at a higher risk for certain long-term diseases and infections as well as injuries from falls. Falls are a major cause of broken bones and head injuries in people who are older than age 76. Getting regular preventive care can help to keep you healthy and well. Preventive care includes getting regular testing and making lifestyle changes as recommended by your health care provider. Talk with your health care provider about: Which screenings and tests you should have. A screening is a test that checks for a disease when you have no symptoms. A diet and exercise plan that is right for you. What should I know about screenings and tests to prevent falls? Screening and testing are the best ways to find a health problem early. Early diagnosis and treatment give you the best chance of managing medical conditions that are common after age 76. Certain conditions and lifestyle choices may make you more likely to have a fall. Your health care provider may recommend: Regular vision checks. Poor vision and conditions such as cataracts can make you more likely to have a fall. If you wear glasses, make sure to get your prescription updated if your vision changes. Medicine review. Work with your health care provider to regularly review all of the medicines you are taking, including over-the-counter medicines. Ask your health care provider about any side effects that may make you more likely to have a fall. Tell your health care provider if any medicines that you take make you feel dizzy or sleepy. Strength and balance checks. Your health care provider may recommend certain tests to check your strength and balance while standing, walking, or changing positions. Foot health exam. Foot pain and numbness, as well as not wearing proper footwear, can make you more likely to have a fall. Screenings, including: Osteoporosis screening. Osteoporosis is a condition that causes  the bones to get weaker and break more easily. Blood pressure screening. Blood pressure changes and medicines to control blood pressure can make you feel dizzy. Depression screening. You may be more likely to have a fall if you have a fear of falling, feel depressed, or feel unable to do activities that you used to do. Alcohol use screening. Using too much alcohol can affect your balance and may make you more likely to have a fall. Follow these instructions at home: Lifestyle Do not drink alcohol if: Your health care provider tells you not to drink. If you drink alcohol: Limit how much you have to: 0-1 drink a day for women. 0-2 drinks a day for men. Know how much alcohol is in your drink. In the [place] one drink equals one 12 oz bottle of beer (355 mL), one 5 oz glass of wine (148 mL), or one 1 oz glass of hard liquor (44 mL). Do not use any products that contain nicotine or tobacco. These products include cigarettes, chewing tobacco, and vaping devices, such as e-cigarettes. If you need help quitting, ask your health care provider. Activity  Follow a regular exercise program to stay fit. This will help you maintain your balance. Ask your health care provider what types of exercise are appropriate for you. If you need a cane or walker, use it as recommended by your health care provider. Wear supportive shoes that have nonskid soles. Safety  Remove any tripping hazards, such as rugs, cords, and clutter. Install safety equipment such as grab bars in bathrooms and safety rails on stairs. Keep rooms and walkways   well-lit. General instructions Talk with your health care provider about your risks for falling. Tell your health care provider if: You fall. Be sure to tell your health care provider about all falls, even ones that seem minor. You feel dizzy, tiredness (fatigue), or off-balance. Take over-the-counter and prescription medicines only as told by your health care provider. These include  supplements. Eat a healthy diet and maintain a healthy weight. A healthy diet includes low-fat dairy products, low-fat (lean) meats, and fiber from whole grains, beans, and lots of fruits and vegetables. Stay current with your vaccines. Schedule regular health, dental, and eye exams. Summary Having a healthy lifestyle and getting preventive care can help to protect your health and wellness after age 76. Screening and testing are the best way to find a health problem early and help you avoid having a fall. Early diagnosis and treatment give you the best chance for managing medical conditions that are more common for people who are older than age 76. Falls are a major cause of broken bones and head injuries in people who are older than age 76. Take precautions to prevent a fall at home. Work with your health care provider to learn what changes you can make to improve your health and wellness and to prevent falls. This information is not intended to replace advice given to you by your health care provider. Make sure you discuss any questions you have with your health care provider. Document Revised: 05/28/2020 Document Reviewed: 05/28/2020 Elsevier Patient Education  2024 [employer]  

## 2023-02-09 NOTE — Progress Notes (Signed)
Subjective:    Patient ID: Ronald Berger, male    DOB: 12-May-1945, 78 y.o.   MRN: [id number]  Chief Complaint  Patient presents with   Medical Management of Chronic Issues    Arms going to sleep and hurting carple tunnel   Pt presents to the office today chronic follow up. He is followed by nephrologists for every 6 months for kidneys stones.   Followed by Ortho every 6 month for chronic neck pain. Getting steroid injections.    He has CAD and has hs NSTEMI and takes atorvastatin daily. Followed by Cardiologists every other year.    Has OSA, but does not wear CPAP. Has been years since last sleep study.  Hypertension This is a chronic problem. The current episode started more than 1 year ago. The problem has been resolved since onset. The problem is controlled. Associated symptoms include malaise/fatigue and neck pain. Pertinent negatives include no blurred vision, peripheral edema or shortness of breath. Risk factors for coronary artery disease include obesity, male gender and dyslipidemia. The current treatment provides moderate improvement.  Asthma He complains of wheezing. There is no cough or shortness of breath. This is a chronic problem. The current episode started more than 1 year ago. The problem occurs intermittently. The problem has been waxing and waning. Associated symptoms include heartburn and malaise/fatigue. His symptoms are alleviated by beta-agonist and leukotriene antagonist. He reports moderate improvement on treatment. His past medical history is significant for asthma.  Gastroesophageal Reflux He complains of belching, heartburn and wheezing. He reports no coughing. This is a chronic problem. The current episode started more than 1 year ago. The problem occurs rarely. Risk factors include obesity. He has tried a PPI for the symptoms. The treatment provided moderate relief.  Hyperlipidemia This is a chronic problem. The current episode started more than 1 year ago.  The problem is controlled. Recent lipid tests were reviewed and are normal. Exacerbating diseases include obesity. Pertinent negatives include no shortness of breath. Current antihyperlipidemic treatment includes statins. The current treatment provides moderate improvement of lipids. Risk factors for coronary artery disease include dyslipidemia, diabetes mellitus, male sex and hypertension.  Diabetes He presents for his follow-up diabetic visit. Diabetes type: prediabetic. Pertinent negatives for diabetes include no blurred vision and no foot paresthesias. Symptoms are stable. Risk factors for coronary artery disease include hypertension and male sex. He is following a generally healthy diet. (Does not check at home)  Neck Pain  This is a chronic problem. The current episode started more than 1 year ago. The problem occurs intermittently. The problem has been waxing and waning. The pain is mild.  Insomnia Primary symptoms: difficulty falling asleep, frequent awakening, malaise/fatigue.   Past treatments include medication. The treatment provided moderate relief.      Review of Systems  Constitutional:  Positive for malaise/fatigue.  Eyes:  Negative for blurred vision.  Respiratory:  Positive for wheezing. Negative for cough and shortness of breath.   Gastrointestinal:  Positive for heartburn.  Musculoskeletal:  Positive for neck pain.  Psychiatric/Behavioral:  The patient has insomnia.   All other systems reviewed and are negative.  Family History  Problem Relation Age of Onset   Coronary artery disease Mother        CABG   CAD Sister    Bone cancer Sister    Alzheimer's disease Father    Aneurysm Brother        brain   Heart failure Sister    Bone cancer  Sister    Colon cancer Neg Hx    Colon polyps Neg Hx    Esophageal cancer Neg Hx    Rectal cancer Neg Hx    Stomach cancer Neg Hx    Social History   Socioeconomic History   Marital status: Married    Spouse name: Cala Bradford    Number of children: 1   Years of education: 12   Highest education level: Some college, no degree  Occupational History   Occupation: [employer]    Comment: Retired - prior Printmaker  Tobacco Use   Smoking status: Former    Current packs/day: 0.00    Types: Cigarettes    Start date: 04/26/1965    Quit date: 04/27/1966    Years since quitting: 56.8   Smokeless tobacco: Never  Vaping Use   Vaping status: Never Used  Substance and Sexual Activity   Alcohol use: Yes    Comment: occasional   Drug use: No   Sexual activity: Yes  Other Topics Concern   Not on file  Social History Narrative   Son lives with them   One level home   Social Drivers of Health   Financial Resource Strain: Low Risk  (04/07/2022)   Overall Financial Resource Strain (CARDIA)    Difficulty of Paying Living Expenses: Not hard at all  Food Insecurity: No Food Insecurity (04/07/2022)   Hunger Vital Sign    Worried About Running Out of Food in the Last Year: Never true    Ran Out of Food in the Last Year: Never true  Transportation Needs: No Transportation Needs (04/07/2022)   PRAPARE - Administrator, Civil Service (Medical): No    Lack of Transportation (Non-Medical): No  Physical Activity: Sufficiently Active (04/07/2022)   Exercise Vital Sign    Days of Exercise per Week: 5 days    Minutes of Exercise per Session: 60 min  Stress: No Stress Concern Present (04/07/2022)   [employer] of Occupational Health - Occupational Stress Questionnaire    Feeling of Stress : Not at all  Social Connections: Moderately Isolated (04/07/2022)   Social Connection and Isolation Panel [NHANES]    Frequency of Communication with Friends and Family: More than three times a week    Frequency of Social Gatherings with Friends and Family: More than three times a week    Attends Religious Services: Never    Database administrator or Organizations: No    Attends Banker Meetings:  Never    Marital Status: Married       Objective:   Physical Exam Vitals reviewed.  Constitutional:      General: He is not in acute distress.    Appearance: He is well-developed. He is obese.  HENT:     Head: Normocephalic.     Right Ear: Tympanic membrane normal.     Left Ear: Tympanic membrane normal.  Eyes:     General:        Right eye: No discharge.        Left eye: No discharge.     Pupils: Pupils are equal, round, and reactive to light.  Neck:     Thyroid: No thyromegaly.  Cardiovascular:     Rate and Rhythm: Normal rate and regular rhythm.     Heart sounds: Normal heart sounds. No murmur heard. Pulmonary:     Effort: Pulmonary effort is normal. No respiratory distress.     Breath sounds: Normal breath sounds.  No wheezing.  Abdominal:     General: Bowel sounds are normal. There is no distension.     Palpations: Abdomen is soft.     Tenderness: There is no abdominal tenderness.  Musculoskeletal:        General: No tenderness. Normal range of motion.     Cervical back: Normal range of motion and neck supple.  Skin:    General: Skin is warm and dry.     Findings: No erythema or rash.  Neurological:     Mental Status: He is alert and oriented to person, place, and time.     Cranial Nerves: No cranial nerve deficit.     Deep Tendon Reflexes: Reflexes are normal and symmetric.  Psychiatric:        Behavior: Behavior normal.        Thought Content: Thought content normal.        Judgment: Judgment normal.       BP (!) 144/67   Pulse (!) 49   Temp 97.9 F (36.6 C) (Temporal)   Ht 5\' 9"  (1.753 m)   Wt 239 lb (108.4 kg)   SpO2 99%   BMI 35.29 kg/m      Assessment & Plan:  JARVON SCHOPP comes in today with chief complaint of Medical Management of Chronic Issues (Arms going to sleep and hurting carple tunnel)   Diagnosis and orders addressed:  1. Moderate persistent asthma without complication - albuterol (VENTOLIN HFA) 108 (90 Base) MCG/ACT  inhaler; Inhale 2 puffs into the lungs every 6 (six) hours as needed for wheezing or shortness of breath.  Dispense: 20.1 g; Refill: 1 - CMP14+EGFR  2. Mild persistent asthma, uncomplicated - fluticasone-salmeterol (ADVAIR) 250-50 MCG/ACT AEPB; Inhale 1 puff into the lungs in the morning and at bedtime.  Dispense: 60 each; Refill: 5 - montelukast (SINGULAIR) 10 MG tablet; Take 1 tablet (10 mg total) by mouth at bedtime.  Dispense: 90 tablet; Refill: 3 - CMP14+EGFR  3. Muscle pain, cervical - methocarbamol (ROBAXIN) 500 MG tablet; TAKE 1 TABLET EVERY 8 HOURS AS NEEDED FOR MUSCLE SPASM(S)  Dispense: 180 tablet; Refill: 5 - CMP14+EGFR  4. Palpitations - metoprolol tartrate (LOPRESSOR) 25 MG tablet; Take 0.5 tablets (12.5 mg total) by mouth 2 (two) times daily.  Dispense: 90 tablet; Refill: 0 - CMP14+EGFR  5. Gastroesophageal reflux disease, unspecified whether esophagitis present - omeprazole (PRILOSEC) 20 MG capsule; TAKE 1 CAPSULE EVERY EVENING  Dispense: 90 capsule; Refill: 1 - CMP14+EGFR  6. Primary insomnia - traZODone (DESYREL) 50 MG tablet; Take 1 tablet (50 mg total) by mouth at bedtime.  Dispense: 90 tablet; Refill: 3 - CMP14+EGFR  7. Cervicalgia - diclofenac (VOLTAREN) 75 MG EC tablet; Take 1 tablet (75 mg total) by mouth 2 (two) times daily.  Dispense: 180 tablet; Refill: 3 - CMP14+EGFR  8. Bee sting allergy - EPINEPHrine 0.3 mg/0.3 mL IJ SOAJ injection; Inject 0.3 mg into the muscle as needed for anaphylaxis.  Dispense: 2 each; Refill: 2 - CMP14+EGFR  9. Seasonal allergic rhinitis due to pollen - montelukast (SINGULAIR) 10 MG tablet; Take 1 tablet (10 mg total) by mouth at bedtime.  Dispense: 90 tablet; Refill: 3 - CMP14+EGFR  10. History of non-ST elevation myocardial infarction (NSTEMI) - CMP14+EGFR  11. Hyperlipidemia LDL goal <70 - CMP14+EGFR  12. OSA (obstructive sleep apnea) - CMP14+EGFR  13. Prediabetes - Bayer DCA Hb A1c Waived - CMP14+EGFR  14.  Primary hypertension (Primary) - CMP14+EGFR    Labs pending Continue current  medications  Keep follow up with specialists  Health Maintenance reviewed Diet and exercise encouraged  Follow up plan: 6 months    [name], FNP

## 2023-02-10 LAB — CMP14+EGFR
ALT: 43 [IU]/L (ref 0–44)
AST: 25 [IU]/L (ref 0–40)
Albumin: 3.9 g/dL (ref 3.8–4.8)
Alkaline Phosphatase: 98 [IU]/L (ref 44–121)
BUN/Creatinine Ratio: 17 (ref 10–24)
BUN: 18 mg/dL (ref 8–27)
Bilirubin Total: 0.4 mg/dL (ref 0.0–1.2)
CO2: 24 mmol/L (ref 20–29)
Calcium: 8.8 mg/dL (ref 8.6–10.2)
Chloride: 106 mmol/L (ref 96–106)
Creatinine, Ser: 1.06 mg/dL (ref 0.76–1.27)
Globulin, Total: 2.6 g/dL (ref 1.5–4.5)
Glucose: 107 mg/dL — ABNORMAL HIGH (ref 70–99)
Potassium: 4.9 mmol/L (ref 3.5–5.2)
Sodium: 140 mmol/L (ref 134–144)
Total Protein: 6.5 g/dL (ref 6.0–8.5)
eGFR: 73 mL/min/{1.73_m2} (ref >59)

## 2023-02-20 ENCOUNTER — Telehealth (INDEPENDENT_AMBULATORY_CARE_PROVIDER_SITE_OTHER): Payer: Medicare HMO | Admitting: Family

## 2023-02-20 ENCOUNTER — Encounter: Payer: Self-pay | Admitting: Family

## 2023-02-20 DIAGNOSIS — R399 Unspecified symptoms and signs involving the genitourinary system: Secondary | ICD-10-CM

## 2023-02-20 DIAGNOSIS — R319 Hematuria, unspecified: Secondary | ICD-10-CM | POA: Diagnosis not present

## 2023-02-20 LAB — MICROSCOPIC EXAMINATION
RBC, Urine: >30 /[HPF] — AB (ref 0–2)
Renal Epithel, UA: NONE SEEN /[HPF]
Yeast, UA: NONE SEEN

## 2023-02-20 LAB — URINALYSIS, COMPLETE
Bilirubin, UA: NEGATIVE
Glucose, UA: NEGATIVE
Leukocytes,UA: NEGATIVE
Nitrite, UA: NEGATIVE
Specific Gravity, UA: >1.03 — ABNORMAL HIGH (ref 1.005–1.030)
Urobilinogen, Ur: 0.2 mg/dL (ref 0.2–1.0)
pH, UA: 5.5 (ref 5.0–7.5)

## 2023-02-20 MED ORDER — CEPHALEXIN 500 MG PO CAPS: 500.0000 mg | ORAL_CAPSULE | Freq: Two times a day (BID) | ORAL | 0 refills | Status: DC

## 2023-02-20 NOTE — Patient Instructions (Signed)
 Urinary Tract Infection, Male A urinary tract infection (UTI) is an infection in your urinary tract. The urinary tract is made up of the organs that make, store, and get rid of pee (urine) in your body. These organs include: The kidneys. The ureters. The bladder. The urethra. What are the causes? Most UTIs are caused by germs called bacteria. They may be in or near your genitals. These germs grow and cause swelling in your urinary tract. What increases the risk? You're more likely to get a UTI if: You have a soft tube called a catheter that drains your pee. You can't control when you pee or poop. You have trouble peeing because of: A prostate that's bigger than normal. A kidney stone. A urinary blockage. A nerve condition that affects your bladder. Not getting enough to drink. You're sexually active. You're an older adult. You're also more likely to get a UTI if you have other health problems, such as: Diabetes. A weak immune system. Your immune system is your body's defense system. Sickle cell disease. Injury of the spine. What are the signs or symptoms? Symptoms may include: Needing to pee right away. Peeing small amounts often. Trouble getting the stream started. Pain or burning when you pee. Blood in your pee. Pee that smells bad or odd. Pain in your belly or lower back. You may also: Feel confused. This may be the first symptom in older adults. Vomit. Not feel hungry. Feel tired or easily annoyed. Have a fever or chills. How is this diagnosed? A UTI is diagnosed based on your medical history and an exam. You may also have other tests. These may include: Pee tests. Blood tests. Tests for sexually transmitted infections (STIs). If you've had more than one UTI, you may need to have imaging studies done to find out why you keep getting them. How is this treated? A UTI can be treated by: Taking antibiotics or other medicines. Drinking enough fluid to keep your pee  pale yellow. In rare cases, a UTI can cause a very bad condition called sepsis. Sepsis may be treated in the hospital. Follow these instructions at home: Medicines Take your medicines only as told by your health care provider. If you were given antibiotics, take them as told by your provider. Do not stop taking them even if you start to feel better. General instructions Make sure you: Pee often and fully. Do not hold your pee for a long time. Pee after you have sex. Contact a health care provider if: Your symptoms don't get better after 1-2 days of taking antibiotics. Your symptoms go away and then come back. You have a fever or chills. You vomit or feel like you may vomit. Get help right away if: You have very bad pain in your back or lower belly. You faint. This information is not intended to replace advice given to you by your health care provider. Make sure you discuss any questions you have with your health care provider. Document Revised: 04/11/2022 Document Reviewed: 04/11/2022 Elsevier Patient Education  2024 [employer].

## 2023-02-20 NOTE — Progress Notes (Signed)
Virtual Visit Consent   Ronald Berger, you are scheduled for a virtual visit with a [facility] provider today. Just as with appointments in the office, your consent must be obtained to participate. Your consent will be active for this visit and any virtual visit you may have with one of our providers in the next 365 days. If you have a MyChart account, a copy of this consent can be sent to you electronically.  As this is a virtual visit, video technology does not allow for your provider to perform a traditional examination. This may limit your provider's ability to fully assess your condition. If your provider identifies any concerns that need to be evaluated in person or the need to arrange testing (such as labs, EKG, etc.), we will make arrangements to do so. Although advances in technology are sophisticated, we cannot ensure that it will always work on either your end or our end. If the connection with a video visit is poor, the visit may have to be switched to a telephone visit. With either a video or telephone visit, we are not always able to ensure that we have a secure connection.  By engaging in this virtual visit, you consent to the provision of healthcare and authorize for your insurance to be billed (if applicable) for the services provided during this visit. Depending on your insurance coverage, you may receive a charge related to this service.  I need to obtain your verbal consent now. Are you willing to proceed with your visit today? Ronald Berger has provided verbal consent on 02/20/2023 for a virtual visit (video or telephone). Ronald Rodney, FNP  Date: 02/20/2023 10:20 AM  Virtual Visit via Video Note   I, Ronald Berger, connected with  Ronald Berger  ([id number], Apr 14, 1945) on 02/20/23 at 10:10 AM EST by a video-enabled telemedicine application and verified that I am speaking with the correct person using two identifiers.  Location: Patient: Virtual Visit Location  Patient: car Provider: Virtual Visit Location Provider: Home Office   I discussed the limitations of evaluation and management by telemedicine and the availability of in person appointments. The patient expressed understanding and agreed to proceed.    History of Present Illness: Ronald Berger is a 78 y.o. who identifies as a male who was assigned male at birth, and is being seen today for hematuria.  HPI: Hematuria This is a new problem. The current episode started yesterday. The problem is unchanged. His pain is at a severity of 0/10. He is experiencing no pain. He describes his urine color as tea-colored. Irritative symptoms include nocturia and urgency. Irritative symptoms do not include frequency. Associated symptoms include flank pain. Pertinent negatives include no fever or genital pain. His past medical history is significant for kidney stones.    Problems:  Patient Active Problem List   Diagnosis Date Noted   Primary hypertension 12/24/2021   Prediabetes 02/12/2021   Seasonal allergic rhinitis due to pollen 02/12/2021   Moderate persistent asthma with exacerbation 02/12/2021   Family history of bone cancer    Insomnia 12/08/2018   Bilateral carpal tunnel syndrome 12/08/2018   Adenomatous polyps 12/08/2018   OSA (obstructive sleep apnea) 04/30/2017   Palpitations 08/29/2015   CAD in native artery 08/23/2013   Hyperlipidemia LDL goal <70 08/23/2013   History of non-ST elevation myocardial infarction (NSTEMI) 08/05/2013   Esophageal reflux 01/22/2007    Allergies:  Allergies  Allergen Reactions   Bee Venom Shortness Of Breath   Oxycodone  Nausea And Vomiting   Percocet [Oxycodone-Acetaminophen] Nausea And Vomiting   Medications:  Current Outpatient Medications:    cephALEXin (KEFLEX) 500 MG capsule, Take 1 capsule (500 mg total) by mouth 2 (two) times daily., Disp: 14 capsule, Rfl: 0   albuterol (VENTOLIN HFA) 108 (90 Base) MCG/ACT inhaler, Inhale 2 puffs into the  lungs every 6 (six) hours as needed for wheezing or shortness of breath., Disp: 20.1 g, Rfl: 1   alfuzosin (UROXATRAL) 10 MG 24 hr tablet, TAKE 1 TABLET DAILY WITH BREAKFAST. PLEASE STOP SILODOSIN 8MG  AND START TAKING ALFUZOSIN, Disp: 90 tablet, Rfl: 3   aspirin 81 MG chewable tablet, Chew 1 tablet (81 mg total) by mouth daily., Disp: , Rfl:    atorvastatin (LIPITOR) 40 MG tablet, Take 1 tablet (40 mg total) by mouth daily., Disp: 90 tablet, Rfl: 3   diclofenac (VOLTAREN) 75 MG EC tablet, Take 1 tablet (75 mg total) by mouth 2 (two) times daily., Disp: 180 tablet, Rfl: 3   diclofenac Sodium (VOLTAREN) 1 % GEL, Apply 2 g topically 4 (four) times daily., Disp: 150 g, Rfl: 2   EPINEPHrine 0.3 mg/0.3 mL IJ SOAJ injection, Inject 0.3 mg into the muscle as needed for anaphylaxis., Disp: 2 each, Rfl: 2   fluticasone (FLONASE) 50 MCG/ACT nasal spray, Place 2 sprays into both nostrils daily., Disp: 48 g, Rfl: 3   fluticasone-salmeterol (ADVAIR) 250-50 MCG/ACT AEPB, Inhale 1 puff into the lungs in the morning and at bedtime., Disp: 60 each, Rfl: 5   KRILL OIL PO, Take by mouth., Disp: , Rfl:    levocetirizine (XYZAL) 5 MG tablet, TAKE 1 TABLET EVERY EVENING, Disp: 90 tablet, Rfl: 3   methocarbamol (ROBAXIN) 500 MG tablet, TAKE 1 TABLET EVERY 8 HOURS AS NEEDED FOR MUSCLE SPASM(S), Disp: 180 tablet, Rfl: 5   metoprolol tartrate (LOPRESSOR) 25 MG tablet, Take 0.5 tablets (12.5 mg total) by mouth 2 (two) times daily., Disp: 90 tablet, Rfl: 0   Misc Natural Products (NEURIVA PO), Take by mouth., Disp: , Rfl:    montelukast (SINGULAIR) 10 MG tablet, Take 1 tablet (10 mg total) by mouth at bedtime., Disp: 90 tablet, Rfl: 3   nitroGLYCERIN (NITROSTAT) 0.4 MG SL tablet, Place 1 tablet (0.4 mg total) under the tongue every 5 (five) minutes as needed for chest pain., Disp: 25 tablet, Rfl: 2   omeprazole (PRILOSEC) 20 MG capsule, TAKE 1 CAPSULE EVERY EVENING, Disp: 90 capsule, Rfl: 1   traZODone (DESYREL) 50 MG tablet,  Take 1 tablet (50 mg total) by mouth at bedtime., Disp: 90 tablet, Rfl: 3  Observations/Objective: Patient is well-developed, well-nourished in no acute distress.  Resting comfortably Head is normocephalic, atraumatic.  No labored breathing.  Speech is clear and coherent with logical content.  Patient is alert and oriented at baseline.    Assessment and Plan: 1. Hematuria, unspecified type (Primary) - Urinalysis, Complete - Urine Culture  2. UTI symptoms - cephALEXin (KEFLEX) 500 MG capsule; Take 1 capsule (500 mg total) by mouth 2 (two) times daily.  Dispense: 14 capsule; Refill: 0  Force fluids AZO over the counter X2 days Culture pending  Follow Up Instructions: I discussed the assessment and treatment plan with the patient. The patient was provided an opportunity to ask questions and all were answered. The patient agreed with the plan and demonstrated an understanding of the instructions.  A copy of instructions were sent to the patient via MyChart unless otherwise noted below.     The patient was  advised to call back or seek an in-person evaluation if the symptoms worsen or if the condition fails to improve as anticipated.    Ronald Rodney, FNP

## 2023-02-22 LAB — URINE CULTURE

## 2023-03-03 ENCOUNTER — Telehealth (INDEPENDENT_AMBULATORY_CARE_PROVIDER_SITE_OTHER): Payer: Medicare HMO | Admitting: Family

## 2023-03-03 ENCOUNTER — Encounter: Payer: Self-pay | Admitting: Family

## 2023-03-03 DIAGNOSIS — R6889 Other general symptoms and signs: Secondary | ICD-10-CM | POA: Diagnosis not present

## 2023-03-03 DIAGNOSIS — J4540 Moderate persistent asthma, uncomplicated: Secondary | ICD-10-CM | POA: Diagnosis not present

## 2023-03-03 MED ORDER — BENZONATATE 200 MG PO CAPS: 200.0000 mg | ORAL_CAPSULE | Freq: Three times a day (TID) | ORAL | 1 refills | Status: DC | PRN

## 2023-03-03 MED ORDER — OSELTAMIVIR PHOSPHATE 75 MG PO CAPS: 75.0000 mg | ORAL_CAPSULE | Freq: Two times a day (BID) | ORAL | 0 refills | Status: DC

## 2023-03-03 MED ORDER — PREDNISONE 10 MG (21) PO TBPK: ORAL_TABLET | ORAL | 0 refills | Status: DC

## 2023-03-03 MED ORDER — ALBUTEROL SULFATE HFA 108 (90 BASE) MCG/ACT IN AERS: 2.0000 | INHALATION_SPRAY | Freq: Four times a day (QID) | RESPIRATORY_TRACT | 1 refills | Status: DC | PRN

## 2023-03-03 NOTE — Progress Notes (Signed)
 Virtual Visit Consent   [name], you are scheduled for a virtual visit with a [facility] provider today. Just as with appointments in the office, your consent must be obtained to participate. Your consent will be active for this visit and any virtual visit you may have with one of our providers in the next 365 days. If you have a MyChart account, a copy of this consent can be sent to you electronically.  As this is a virtual visit, video technology does not allow for your provider to perform a traditional examination. This may limit your provider's ability to fully assess your condition. If your provider identifies any concerns that need to be evaluated in person or the need to arrange testing (such as labs, EKG, etc.), we will make arrangements to do so. Although advances in technology are sophisticated, we cannot ensure that it will always work on either your end or our end. If the connection with a video visit is poor, the visit may have to be switched to a telephone visit. With either a video or telephone visit, we are not always able to ensure that we have a secure connection.  By engaging in this virtual visit, you consent to the provision of healthcare and authorize for your insurance to be billed (if applicable) for the services provided during this visit. Depending on your insurance coverage, you may receive a charge related to this service.  I need to obtain your verbal consent now. Are you willing to proceed with your visit today? Ronald Berger has provided verbal consent on 03/03/2023 for a virtual visit (video or telephone). Ronald Rodney, FNP  Date: 03/03/2023 1:42 PM   Virtual Visit via Video Note   I, Ronald Berger, connected with  Ronald Berger  ([id number], Ronald Berger, 1947) on 02/11/Berger at  1:55 PM EST by a video-enabled telemedicine application and verified that I am speaking with the correct person using two identifiers.  Location: Patient: Virtual Visit Location  Patient: Home Provider: Virtual Visit Location Provider: Home Office   I discussed the limitations of evaluation and management by telemedicine and the availability of in person appointments. The patient expressed understanding and agreed to proceed.    History of Present Illness: Ronald Berger, and is being seen today for flu like symptoms that started 2 days ago. He reports his wife had flu several days ago.   HPI: Influenza This is a new problem. The current episode started in the past 7 days. The problem occurs constantly. The problem has been gradually worsening. Associated symptoms include chills, congestion, coughing, fatigue, a fever, headaches and myalgias. Pertinent negatives include no joint swelling, nausea, sore throat or vomiting. He has tried NSAIDs for the symptoms. The treatment provided mild relief.    Problems:  Patient Active Problem List   Diagnosis Date Noted   Primary hypertension 12/24/2021   Prediabetes 02/12/2021   Seasonal allergic rhinitis due to pollen 02/12/2021   Moderate persistent asthma with exacerbation 02/12/2021   Family history of bone cancer    Insomnia 12/08/2018   Bilateral carpal tunnel syndrome 12/08/2018   Adenomatous polyps 12/08/2018   OSA (obstructive sleep apnea) 04/30/2017   Palpitations 08/29/2015   CAD in native artery 08/23/2013   Hyperlipidemia LDL goal <70 08/23/2013   History of non-ST elevation myocardial infarction (NSTEMI) 08/05/2013   Esophageal reflux 01/22/2007    Allergies:  Allergies  Allergen Reactions  Bee Venom Shortness Of Breath   Oxycodone Nausea And Vomiting   Percocet [Oxycodone-Acetaminophen] Nausea And Vomiting   Medications:  Current Outpatient Medications:    benzonatate (TESSALON) 200 MG capsule, Take 1 capsule (200 mg total) by mouth 3 (three) times daily as needed., Disp: 30 capsule, Rfl: 1   oseltamivir (TAMIFLU) 75 MG capsule,  Take 1 capsule (75 mg total) by mouth 2 (two) times daily., Disp: 10 capsule, Rfl: 0   predniSONE (STERAPRED UNI-PAK 21 TAB) 10 MG (21) TBPK tablet, Use as directed, Disp: 21 tablet, Rfl: 0   albuterol (VENTOLIN HFA) 108 (90 Base) MCG/ACT inhaler, Inhale 2 puffs into the lungs every 6 (six) hours as needed for wheezing or shortness of breath., Disp: 20.1 g, Rfl: 1   alfuzosin (UROXATRAL) 10 MG 24 hr tablet, TAKE 1 TABLET DAILY WITH BREAKFAST. PLEASE STOP SILODOSIN 8MG  AND START TAKING ALFUZOSIN, Disp: 90 tablet, Rfl: 3   aspirin 81 MG chewable tablet, Chew 1 tablet (81 mg total) by mouth daily., Disp: , Rfl:    atorvastatin (LIPITOR) 40 MG tablet, Take 1 tablet (40 mg total) by mouth daily., Disp: 90 tablet, Rfl: 3   cephALEXin (KEFLEX) 500 MG capsule, Take 1 capsule (500 mg total) by mouth 2 (two) times daily., Disp: 14 capsule, Rfl: 0   diclofenac (VOLTAREN) 75 MG EC tablet, Take 1 tablet (75 mg total) by mouth 2 (two) times daily., Disp: 180 tablet, Rfl: 3   diclofenac Sodium (VOLTAREN) 1 % GEL, Apply 2 g topically 4 (four) times daily., Disp: 150 g, Rfl: 2   EPINEPHrine 0.3 mg/0.3 mL IJ SOAJ injection, Inject 0.3 mg into the muscle as needed for anaphylaxis., Disp: 2 each, Rfl: 2   fluticasone (FLONASE) 50 MCG/ACT nasal spray, Place 2 sprays into both nostrils daily., Disp: 48 g, Rfl: 3   fluticasone-salmeterol (ADVAIR) 250-50 MCG/ACT AEPB, Inhale 1 puff into the lungs in the morning and at bedtime., Disp: 60 each, Rfl: 5   KRILL OIL PO, Take by mouth., Disp: , Rfl:    levocetirizine (XYZAL) 5 MG tablet, TAKE 1 TABLET EVERY EVENING, Disp: 90 tablet, Rfl: 3   methocarbamol (ROBAXIN) 500 MG tablet, TAKE 1 TABLET EVERY 8 HOURS AS NEEDED FOR MUSCLE SPASM(S), Disp: 180 tablet, Rfl: 5   metoprolol tartrate (LOPRESSOR) Berger MG tablet, Take 0.5 tablets (12.5 mg total) by mouth 2 (two) times daily., Disp: 90 tablet, Rfl: 0   Misc Natural Products (NEURIVA PO), Take by mouth., Disp: , Rfl:    montelukast  (SINGULAIR) 10 MG tablet, Take 1 tablet (10 mg total) by mouth at bedtime., Disp: 90 tablet, Rfl: 3   nitroGLYCERIN (NITROSTAT) 0.4 MG SL tablet, Place 1 tablet (0.4 mg total) under the tongue every 5 (five) minutes as needed for chest pain., Disp: Berger tablet, Rfl: 2   omeprazole (PRILOSEC) 20 MG capsule, TAKE 1 CAPSULE EVERY EVENING, Disp: 90 capsule, Rfl: 1   traZODone (DESYREL) 50 MG tablet, Take 1 tablet (50 mg total) by mouth at bedtime., Disp: 90 tablet, Rfl: 3  Observations/Objective: Patient is well-developed, well-nourished in no acute distress.  Resting comfortably  at home.  Head is normocephalic, atraumatic.  No labored breathing.  Speech is clear and coherent with logical content.  Patient is alert and oriented at baseline.  Coarse nonproductive cough Wheezing  Assessment and Plan: 1. Flu-like symptoms (Primary) - oseltamivir (TAMIFLU) 75 MG capsule; Take 1 capsule (75 mg total) by mouth 2 (two) times daily.  Dispense: 10 capsule; Refill: 0 -  benzonatate (TESSALON) 200 MG capsule; Take 1 capsule (200 mg total) by mouth 3 (three) times daily as needed.  Dispense: 30 capsule; Refill: 1 - albuterol (VENTOLIN HFA) 108 (90 Base) MCG/ACT inhaler; Inhale 2 puffs into the lungs every 6 (six) hours as needed for wheezing or shortness of breath.  Dispense: 20.1 g; Refill: 1 - predniSONE (STERAPRED UNI-PAK 21 TAB) 10 MG (21) TBPK tablet; Use as directed  Dispense: 21 tablet; Refill: 0  2. Moderate persistent asthma without complication - albuterol (VENTOLIN HFA) 108 (90 Base) MCG/ACT inhaler; Inhale 2 puffs into the lungs every 6 (six) hours as needed for wheezing or shortness of breath.  Dispense: 20.1 g; Refill: 1 - predniSONE (STERAPRED UNI-PAK 21 TAB) 10 MG (21) TBPK tablet; Use as directed  Dispense: 21 tablet; Refill: 0  Rest Force fluids Tylenol as needed Droplet precautions  Follow up if symptoms worsen or do not improve  Follow Up Instructions: I discussed the assessment  and treatment plan with the patient. The patient was provided an opportunity to ask questions and all were answered. The patient agreed with the plan and demonstrated an understanding of the instructions.  A copy of instructions were sent to the patient via MyChart unless otherwise noted below.     The patient was advised to call back or seek an in-person evaluation if the symptoms worsen or if the condition fails to improve as anticipated.    Ronald Rodney, FNP

## 2023-03-10 DIAGNOSIS — M25521 Pain in right elbow: Secondary | ICD-10-CM | POA: Diagnosis not present

## 2023-03-13 ENCOUNTER — Other Ambulatory Visit: Payer: Self-pay | Admitting: Family

## 2023-03-13 DIAGNOSIS — I2510 Atherosclerotic heart disease of native coronary artery without angina pectoris: Secondary | ICD-10-CM

## 2023-03-13 DIAGNOSIS — E785 Hyperlipidemia, unspecified: Secondary | ICD-10-CM

## 2023-03-13 DIAGNOSIS — I252 Old myocardial infarction: Secondary | ICD-10-CM

## 2023-04-14 ENCOUNTER — Other Ambulatory Visit: Payer: Self-pay

## 2023-04-14 DIAGNOSIS — N200 Calculus of kidney: Secondary | ICD-10-CM

## 2023-04-15 ENCOUNTER — Ambulatory Visit (HOSPITAL_COMMUNITY)
Admission: RE | Admit: 2023-04-15 | Discharge: 2023-04-15 | Disposition: A | Discharge status: Home / Self Care | Source: Ambulatory Visit | Attending: Urology | Admitting: Urology

## 2023-04-15 ENCOUNTER — Ambulatory Visit: Payer: Medicare HMO | Admitting: Urology

## 2023-04-15 VITALS — BP 170/80 | HR 63

## 2023-04-15 DIAGNOSIS — R351 Nocturia: Secondary | ICD-10-CM | POA: Diagnosis not present

## 2023-04-15 DIAGNOSIS — N200 Calculus of kidney: Secondary | ICD-10-CM | POA: Diagnosis not present

## 2023-04-15 DIAGNOSIS — N401 Enlarged prostate with lower urinary tract symptoms: Secondary | ICD-10-CM

## 2023-04-15 DIAGNOSIS — N138 Other obstructive and reflux uropathy: Secondary | ICD-10-CM | POA: Diagnosis not present

## 2023-04-15 MED ORDER — ALFUZOSIN HCL ER 10 MG PO TB24: 10.0000 mg | ORAL_TABLET | Freq: Two times a day (BID) | ORAL | 3 refills | Status: DC

## 2023-04-15 NOTE — Progress Notes (Signed)
 04/15/2023 4:07 PM   Ronald Berger 09/10/1945 [id number]  Referring provider: Junie Spencer, FNP [address] [place],  [place] [address]  Followup nephrolithiasis   HPI: Mr Bontempo is a 77yo here for followup for nephrolithiasis and BPh with nocturia. IPSS 15 QOL 3 on uroxatral 10mg  daily. He passed small stones almost a year ago. KUB shows stable left renal calculi up to 7mm. No flank pain.   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 NSTEMI 07/2013 - DES to mLCx, DES to prox OM1, DES to mLAD with nonobstructive RCA stenosis, EF 55-65%.   Cataract    Chronic kidney disease    kidney stones    Dyslipidemia    Family history of bone cancer    GERD (gastroesophageal reflux disease)    History of kidney stones    History of non-ST elevation myocardial infarction (NSTEMI)    07/ 2015  S/P  DES X3   History of palpitations    Hyperlipidemia    Hypertension    Insomnia 12/08/2018   x many years   Myocardial infarction (HCC) 2015   NSTEMI- 3 stents    OSA (obstructive sleep apnea)    cpap intolerant   Pneumonia due to COVID-19 virus    Right knee meniscal tear    S/P drug eluting coronary stent placement    07/ 2015  x3  to mLCFX, OM1, mLAD   Sinus bradycardia    Sleep apnea    no cpap     Surgical History: Past Surgical History:  Procedure Laterality Date   CATARACT EXTRACTION, BILATERAL     COLONOSCOPY     >10 yrs ago-polyps per pt- we have no report    EXTRACORPOREAL SHOCK WAVE LITHOTRIPSY  x2 last one 2005   EXTRACORPOREAL SHOCK WAVE LITHOTRIPSY Right 05/28/2021   Procedure: EXTRACORPOREAL SHOCK WAVE LITHOTRIPSY (ESWL);  Surgeon: Milderd Meager., MD;  Location: [facility];  Service: Urology;   Laterality: Right;   KNEE ARTHROSCOPY Right 05/02/2015   Procedure: RIGHT KNEE ARTHROSCOPY WITH medial meniscal DEBRIDEMENT ;  Surgeon: Ollen Gross, MD;  Location: [facility];  Service: Orthopedics;  Laterality: Right;   LEFT HEART CATHETERIZATION WITH CORONARY ANGIOGRAM N/A 08/05/2013   Procedure: LEFT HEART CATHETERIZATION WITH CORONARY ANGIOGRAM;  Surgeon: Micheline Chapman, MD;  Location: [facility] C[facility];  Service: Cardiovascular;  Laterality: N/A;  Promus DES's to  mLCFx,  OM1,  mLAD (total 3)/   mRCA 30%,  dLM  20-30%,  preserved LVSF, ef 55-65%   LITHOTRIPSY     LUMBAR SPINE SURGERY  x3  last one 1980's   SHOULDER ARTHROSCOPY Right 2013   TONSILLECTOMY      Home Medications:  Allergies as of 04/15/2023       Reactions   Bee Venom Shortness Of Breath   Oxycodone Nausea And Vomiting   Percocet [oxycodone-acetaminophen] Nausea And Vomiting        Medication List        Accurate as of April 15, 2023  4:07 PM. If you have any questions, ask your nurse or doctor.          albuterol 108 (90 Base) MCG/ACT inhaler  Commonly known as: VENTOLIN HFA Inhale 2 puffs into the lungs every 6 (six) hours as needed for wheezing or shortness of breath.   alfuzosin 10 MG 24 hr tablet Commonly known as: UROXATRAL TAKE 1 TABLET DAILY WITH BREAKFAST. PLEASE STOP SILODOSIN 8MG  AND START TAKING ALFUZOSIN   aspirin 81 MG chewable tablet Chew 1 tablet (81 mg total) by mouth daily.   atorvastatin 40 MG tablet Commonly known as: LIPITOR Take 1 tablet (40 mg total) by mouth daily.   benzonatate 200 MG capsule Commonly known as: TESSALON Take 1 capsule (200 mg total) by mouth 3 (three) times daily as needed.   cephALEXin 500 MG capsule Commonly known as: KEFLEX Take 1 capsule (500 mg total) by mouth 2 (two) times daily.   diclofenac 75 MG EC tablet Commonly known as: VOLTAREN Take 1 tablet (75 mg total) by mouth 2 (two) times daily.   diclofenac Sodium 1 % Gel Commonly  known as: Voltaren Apply 2 g topically 4 (four) times daily.   EPINEPHrine 0.3 mg/0.3 mL Soaj injection Commonly known as: EPI-PEN Inject 0.3 mg into the muscle as needed for anaphylaxis.   fluticasone 50 MCG/ACT nasal spray Commonly known as: FLONASE Place 2 sprays into both nostrils daily.   fluticasone-salmeterol 250-50 MCG/ACT Aepb Commonly known as: ADVAIR Inhale 1 puff into the lungs in the morning and at bedtime.   KRILL OIL PO Take by mouth.   levocetirizine 5 MG tablet Commonly known as: XYZAL TAKE 1 TABLET EVERY EVENING   methocarbamol 500 MG tablet Commonly known as: ROBAXIN TAKE 1 TABLET EVERY 8 HOURS AS NEEDED FOR MUSCLE SPASM(S)   metoprolol tartrate 25 MG tablet Commonly known as: LOPRESSOR Take 0.5 tablets (12.5 mg total) by mouth 2 (two) times daily.   montelukast 10 MG tablet Commonly known as: SINGULAIR Take 1 tablet (10 mg total) by mouth at bedtime.   NEURIVA PO Take by mouth.   nitroGLYCERIN 0.4 MG SL tablet Commonly known as: NITROSTAT Place 1 tablet (0.4 mg total) under the tongue every 5 (five) minutes as needed for chest pain.   omeprazole 20 MG capsule Commonly known as: PRILOSEC TAKE 1 CAPSULE EVERY EVENING   oseltamivir 75 MG capsule Commonly known as: Tamiflu Take 1 capsule (75 mg total) by mouth 2 (two) times daily.   predniSONE 10 MG (21) Tbpk tablet Commonly known as: STERAPRED UNI-PAK 21 TAB Use as directed   traZODone 50 MG tablet Commonly known as: DESYREL Take 1 tablet (50 mg total) by mouth at bedtime.        Allergies:  Allergies  Allergen Reactions   Bee Venom Shortness Of Breath   Oxycodone Nausea And Vomiting   Percocet [Oxycodone-Acetaminophen] Nausea And Vomiting    Family History: Family History  Problem Relation Age of Onset   Coronary artery disease Mother        CABG   CAD Sister    Bone cancer Sister    Alzheimer's disease Father    Aneurysm Brother        brain   Heart failure Sister     Bone cancer Sister    Colon cancer Neg Hx    Colon polyps Neg Hx    Esophageal cancer Neg Hx    Rectal cancer Neg Hx    Stomach cancer Neg Hx     Social History:  reports that he quit smoking about 57 years ago. His smoking use included cigarettes. He started smoking about 58 years ago. He has never  used smokeless tobacco. He reports current alcohol use. He reports that he does not use drugs.  ROS: All other review of systems were reviewed and are negative except what is noted above in HPI  Physical Exam: BP (!) 170/80   Pulse 63   Constitutional:  Alert and oriented, No acute distress. HEENT: [place] AT, moist mucus membranes.  Trachea midline, no masses. Cardiovascular: No clubbing, cyanosis, or edema. Respiratory: Normal respiratory effort, no increased work of breathing. GI: Abdomen is soft, nontender, nondistended, no abdominal masses GU: No CVA tenderness.  Lymph: No cervical or inguinal lymphadenopathy. Skin: No rashes, bruises or suspicious lesions. Neurologic: Grossly intact, no focal deficits, moving all 4 extremities. Psychiatric: Normal mood and affect.  Laboratory Data: Lab Results  Component Value Date   WBC 8.0 07/31/2022   HGB 14.3 07/31/2022   HCT 44.6 07/31/2022   MCV 85 07/31/2022   PLT 212 07/31/2022    Lab Results  Component Value Date   CREATININE 1.06 02/09/2023    No results found for: "PSA"  No results found for: "TESTOSTERONE"  Lab Results  Component Value Date   HGBA1C 5.5 02/09/2023    Urinalysis    Component Value Date/Time   COLORURINE YELLOW 02/20/2020 2218   APPEARANCEUR Clear 02/20/2023 1008   LABSPEC 1.023 02/20/2020 2218   PHURINE 5.0 02/20/2020 2218   GLUCOSEU Negative 02/20/2023 1008   HGBUR NEGATIVE 02/20/2020 2218   BILIRUBINUR Negative 02/20/2023 1008   KETONESUR NEGATIVE 02/20/2020 2218   PROTEINUR 1+ (A) 02/20/2023 1008   PROTEINUR NEGATIVE 02/20/2020 2218   NITRITE Negative 02/20/2023 1008   NITRITE NEGATIVE  02/20/2020 2218   LEUKOCYTESUR Negative 02/20/2023 1008   LEUKOCYTESUR NEGATIVE 02/20/2020 2218    Lab Results  Component Value Date   LABMICR See below: 02/20/2023   WBCUA 0-5 02/20/2023   LABEPIT 0-10 02/20/2023   MUCUS Present (A) 02/20/2023   BACTERIA Moderate (A) 02/20/2023    Pertinent Imaging: KUB today: Images reviewed and discussed with the patient  Results for orders placed during the hospital encounter of 04/09/22  Abdomen 1 view (KUB)  Narrative CLINICAL DATA:  Nephrolithiasis, follow-up kidney stones  EXAM: ABDOMEN - 1 VIEW  COMPARISON:  10/09/2021  FINDINGS: Three small LEFT renal calculi again identified, largest 5 mm.  No definite RIGHT renal calculi or ureteral calculi.  Bowel gas pattern normal.  Bones demineralized.  IMPRESSION: Three small LEFT renal calculi.   Electronically Signed By: Ulyses Southward M.D. On: 04/11/2022 15:40  No results found for this or any previous visit.  No results found for this or any previous visit.  No results found for this or any previous visit.  Results for orders placed during the hospital encounter of 07/31/21  Ultrasound renal complete  Narrative CLINICAL DATA:  Nephrolithiasis.  EXAM: RENAL / URINARY TRACT ULTRASOUND COMPLETE  COMPARISON:  CT scan May 29, 2021  FINDINGS: Right Kidney:  Renal measurements: 12.3 x 5.2 x 5.8 cm = volume: 192 mL. Contains a 9 mm nonobstructive stone.  Left Kidney:  Renal measurements: 11.2 x 5.9 x 5.4 cm = volume: 187 mL. Contains a 12 mm nonobstructive stone. Contains a 4.8 cm cyst of no significance. No follow-up imaging recommended for the cyst.  Bladder:  Appears normal for degree of bladder distention.  Other:  None.  IMPRESSION: 1. Bilateral nonobstructive renal stones as above. 2. No other significant abnormalities.   Electronically Signed By: Gerome Sam III M.D. On: 08/01/2021 14:10  No results found for  this or any previous  visit.  No results found for this or any previous visit.  Results for orders placed in visit on 05/15/21  CT RENAL STONE STUDY  Narrative CLINICAL DATA:  Bilateral lower abdominal pain x1 week, history of kidney stones.  EXAM: CT ABDOMEN AND PELVIS WITHOUT CONTRAST  TECHNIQUE: Multidetector CT imaging of the abdomen and pelvis was performed following the standard protocol without IV contrast.  RADIATION DOSE REDUCTION: This exam was performed according to the departmental dose-optimization program which includes automated exposure control, adjustment of the mA and/or kV according to patient size and/or use of iterative reconstruction technique.  COMPARISON:  Same day abdominal radiograph.  FINDINGS: Lower chest: Coronary artery calcifications. Borderline cardiac enlargement. Bibasilar scarring versus atelectasis.  Hepatobiliary: No suspicious hepatic lesion on this noncontrast examination. Gallbladder is unremarkable. No biliary ductal dilation.  Pancreas: No pancreatic ductal dilation or evidence of acute inflammation.  Spleen: No splenomegaly or focal splenic lesion.  Adrenals/Urinary Tract: Bilateral adrenal glands appear normal.  No hydronephrosis. Nonobstructive bilateral renal stones measuring up to 7 mm in the right lower pole and 4 mm in the left lower pole. No obstructive ureteral or bladder calculi identified.  Fluid density left lower pole renal lesion measures 2.8 x 2.1 x 4.9 cm is considered benign given the fluid attenuation of the lesion without evidence of complexity on noncontrast examination and most consistent with a single renal cyst or an adjacent renal sinus and cortical renal cysts.  Stomach/Bowel: No radiopaque enteric contrast material was administered. Stomach is unremarkable for degree of distension. No pathologic dilation of small or large bowel. The appendix and terminal ileum appear normal. No evidence of acute  bowel inflammation.  Vascular/Lymphatic: Aortic and branch vessel atherosclerosis without abdominal aortic aneurysm. No pathologically enlarged abdominal or pelvic lymph nodes.  Reproductive: Prostate is unremarkable.  Other: No significant abdominopelvic free fluid.  Musculoskeletal: Multilevel degenerative changes spine. 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  IMPRESSION: 1. Nonobstructive bilateral renal stones measuring up to 7 mm in the right lower pole and 4 mm in the left lower pole. No obstructive ureteral or bladder calculi identified. 2. 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 3. Aortic Atherosclerosis (ICD10-I70.0).   Electronically Signed By: Maudry Mayhew M.D. On: 05/15/2021 15:31   Assessment & Plan:    1. Kidney stones (Primary) Followup 1 year with KUB - Urinalysis, Routine w reflex microscopic  2. Benign prostatic hyperplasia with urinary obstruction Continue uroxatral 10mg  daily  3. Nocturia Continue uroxatral 10mg  daily   No follow-ups on file.  Wilkie Aye, MD  [facility]

## 2023-04-16 LAB — URINALYSIS, ROUTINE W REFLEX MICROSCOPIC
Bilirubin, UA: NEGATIVE
Glucose, UA: NEGATIVE
Ketones, UA: NEGATIVE
Leukocytes,UA: NEGATIVE
Nitrite, UA: NEGATIVE
Protein,UA: NEGATIVE
Specific Gravity, UA: 1.015 (ref 1.005–1.030)
Urobilinogen, Ur: 0.2 mg/dL (ref 0.2–1.0)
pH, UA: 6 (ref 5.0–7.5)

## 2023-04-16 LAB — MICROSCOPIC EXAMINATION: Bacteria, UA: NONE SEEN

## 2023-04-18 ENCOUNTER — Encounter: Payer: Self-pay | Admitting: Urology

## 2023-04-18 NOTE — Patient Instructions (Signed)

## 2023-04-21 ENCOUNTER — Encounter: Payer: Self-pay | Admitting: Urology

## 2023-04-22 NOTE — Telephone Encounter (Signed)
 Pt was called to let him know the proper Rx had already been sent to the correct pharmacy

## 2023-04-24 ENCOUNTER — Other Ambulatory Visit: Payer: Self-pay | Admitting: Family

## 2023-04-24 DIAGNOSIS — R002 Palpitations: Secondary | ICD-10-CM

## 2023-05-08 ENCOUNTER — Other Ambulatory Visit: Payer: Self-pay | Admitting: Family

## 2023-05-09 IMAGING — DX DG CHEST 2V
2 series · 2 of 2 positions shown · non-contrast
Comparison: 02/28/2020.

CLINICAL DATA: Cough.  History of COVID.

EXAM:
CHEST - 2 VIEW

[chest pa]
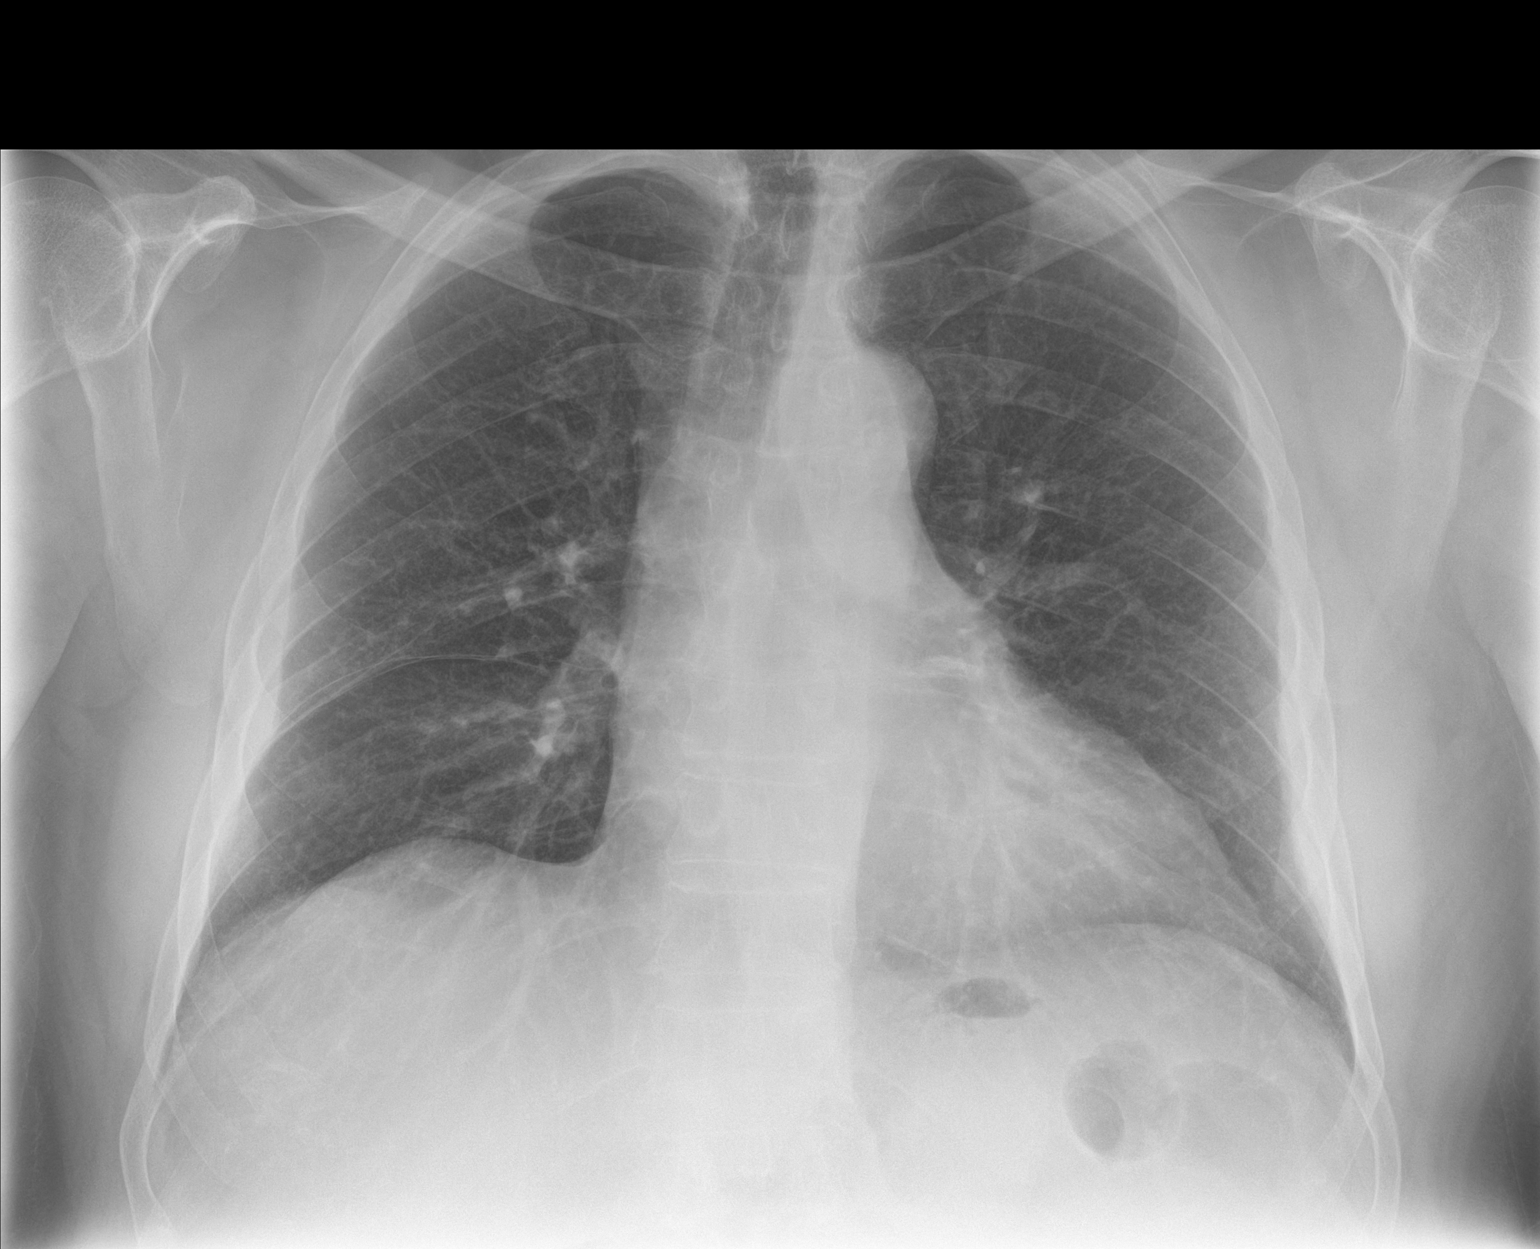

[chest lat]
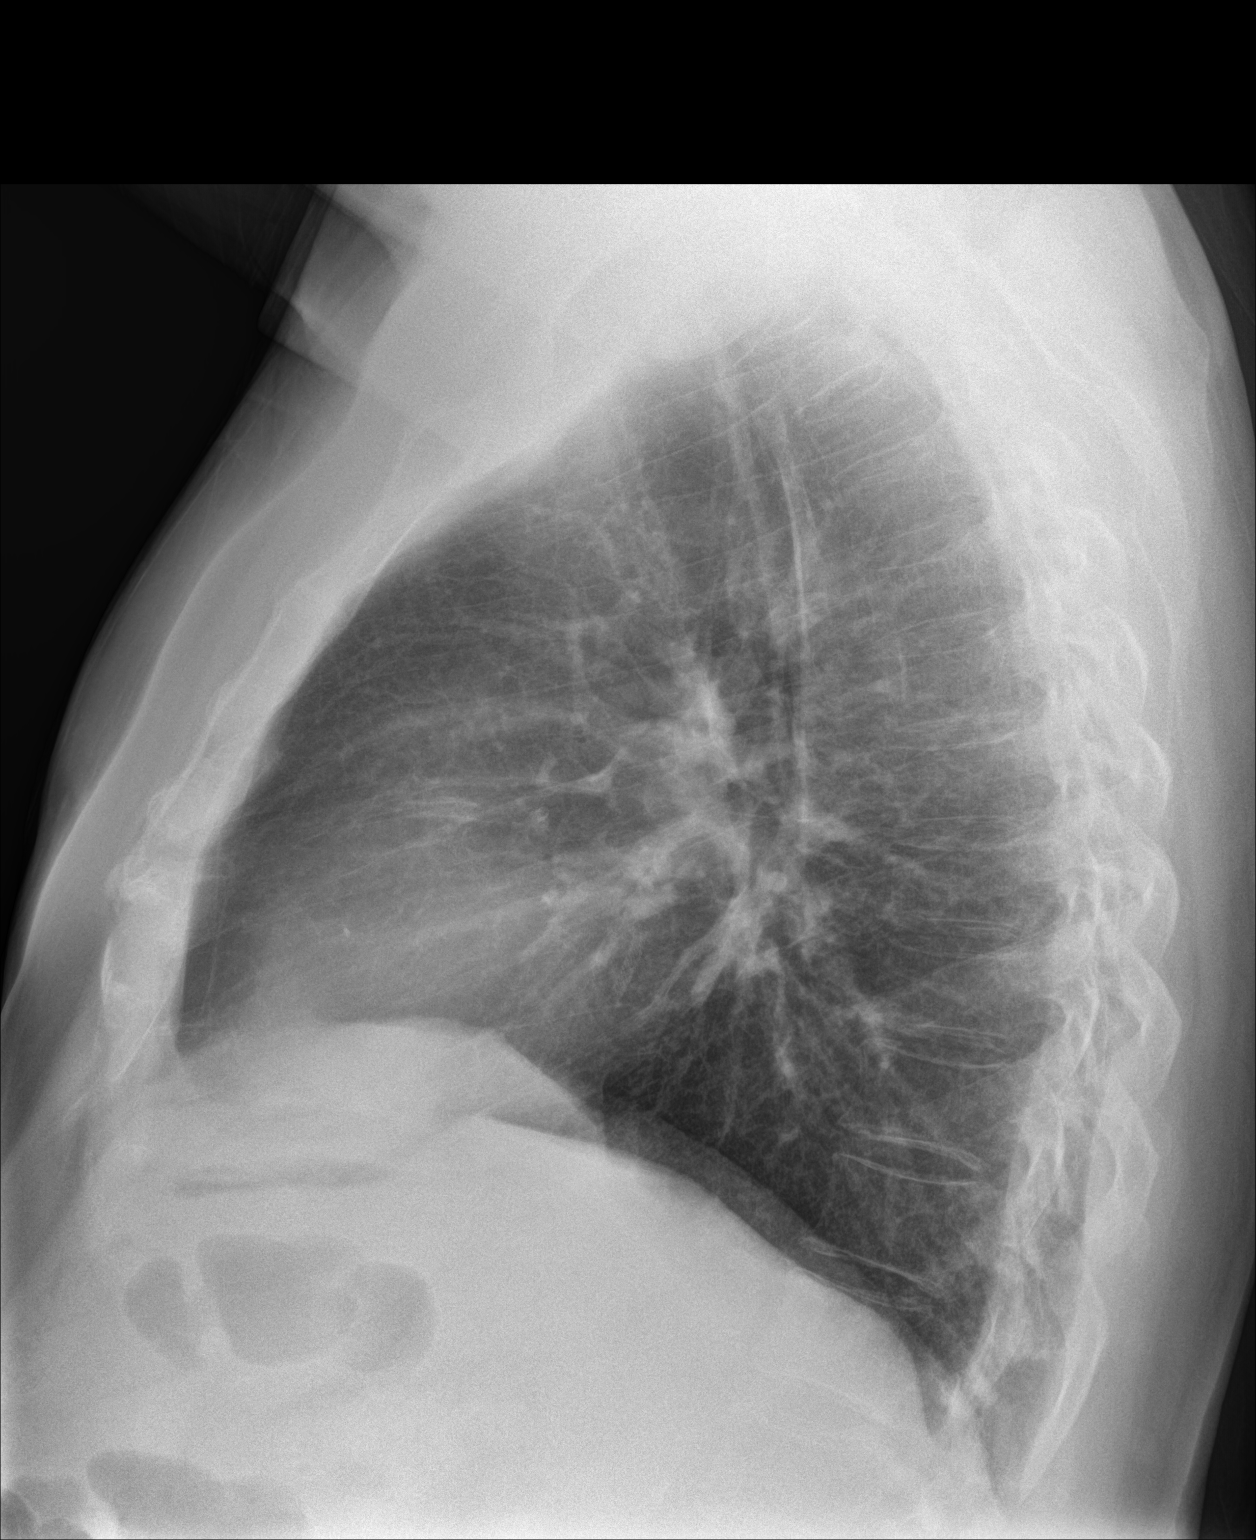

[2 of 2 positions shown; findings below may reference images not displayed]

FINDINGS: Mediastinum and hilar structures normal. Heart size stable. Low lung
volumes. Previously identified multifocal infiltrates have cleared.
No acute infiltrate noted. No pleural effusion or pneumothorax. Mild
pleuroparenchymal thickening consistent scarring again noted.
Thoracic spine scoliosis.
IMPRESSION: Low lung volumes. Previously identified multifocal infiltrates have
cleared. No acute infiltrate noted.

## 2023-06-04 NOTE — Progress Notes (Signed)
 [facility] - Clinic Note  06/16/2023   CHIEF COMPLAINT Patient presents for Retina Follow Up  HISTORY OF PRESENT ILLNESS: Ronald Berger is a 78 y.o. male who presents to the clinic today for:  HPI     Retina Follow Up   Patient presents with  Other.  In both eyes.  This started 6 months ago.  I, the attending physician,  performed the HPI with the patient and updated documentation appropriately.        Comments   Patient here for 6 months retina follow up for ERM OU. Patient states vision not as good as would like. Things are starting to get fuzzy in distance. Feels need cataract surgery. No eye pain.       Last edited by [name], MD on 06/17/2023  2:10 AM.     Pt states his left eye vision is getting blurrier  Referring physician: Yevette Hem, FNP [address] [place],  [place] [address]  HISTORICAL INFORMATION:  Selected notes from the MEDICAL RECORD NUMBER [facility] pt here for full loss of VF LEE:  Ocular Hx- PMH-   CURRENT MEDICATIONS: No current outpatient medications on file. (Ophthalmic Drugs)   No current facility-administered medications for this visit. (Ophthalmic Drugs)   Current Outpatient Medications (Other)  Medication Sig   albuterol  (VENTOLIN  HFA) 108 (90 Base) MCG/ACT inhaler Inhale 2 puffs into the lungs every 6 (six) hours as needed for wheezing or shortness of breath.   alfuzosin  (UROXATRAL ) 10 MG 24 hr tablet Take 1 tablet (10 mg total) by mouth in the morning and at bedtime.   aspirin  81 MG chewable tablet Chew 1 tablet (81 mg total) by mouth daily.   atorvastatin  (LIPITOR ) 40 MG tablet Take 1 tablet (40 mg total) by mouth daily.   benzonatate  (TESSALON ) 200 MG capsule Take 1 capsule (200 mg total) by mouth 3 (three) times daily as needed.   cephALEXin  (KEFLEX ) 500 MG capsule Take 1 capsule (500 mg total) by mouth 2 (two) times daily.   diclofenac  (VOLTAREN ) 75 MG EC tablet Take 1 tablet (75 mg total) by mouth 2  (two) times daily.   diclofenac  Sodium (VOLTAREN ) 1 % GEL Apply 2 g topically 4 (four) times daily.   EPINEPHrine  0.3 mg/0.3 mL IJ SOAJ injection Inject 0.3 mg into the muscle as needed for anaphylaxis.   fluticasone  (FLONASE ) 50 MCG/ACT nasal spray Place 2 sprays into both nostrils daily.   fluticasone -salmeterol (ADVAIR) 250-50 MCG/ACT AEPB Inhale 1 puff into the lungs in the morning and at bedtime.   KRILL OIL PO Take by mouth.   levocetirizine (XYZAL ) 5 MG tablet TAKE 1 TABLET EVERY EVENING   methocarbamol  (ROBAXIN ) 500 MG tablet TAKE 1 TABLET EVERY 8 HOURS AS NEEDED FOR MUSCLE SPASM(S)   metoprolol  tartrate (LOPRESSOR ) 25 MG tablet TAKE 1/2 TABLET TWICE DAILY   Misc Natural Products (NEURIVA PO) Take by mouth.   montelukast  (SINGULAIR ) 10 MG tablet Take 1 tablet (10 mg total) by mouth at bedtime.   nitroGLYCERIN  (NITROSTAT ) 0.4 MG SL tablet DISSOLVE 1 TABLET (0.4 MG TOTAL) UNDER THE TONGUE EVERY 5 (FIVE) MINUTES AS NEEDED FOR CHEST PAIN.   omeprazole  (PRILOSEC) 20 MG capsule TAKE 1 CAPSULE EVERY EVENING   oseltamivir  (TAMIFLU ) 75 MG capsule Take 1 capsule (75 mg total) by mouth 2 (two) times daily.   predniSONE  (STERAPRED UNI-PAK 21 TAB) 10 MG (21) TBPK tablet Use as directed   traZODone  (DESYREL ) 50 MG tablet Take  1 tablet (50 mg total) by mouth at bedtime.   No current facility-administered medications for this visit. (Other)   REVIEW OF SYSTEMS: ROS   Positive for: Cardiovascular, Allergic/Imm Last edited by Sylvan Evener, COA on 06/16/2023  3:09 PM.     ALLERGIES Allergies  Allergen Reactions   Bee Venom Shortness Of Breath   Oxycodone Nausea And Vomiting   Percocet [Oxycodone-Acetaminophen ] Nausea And Vomiting   PAST MEDICAL HISTORY Past Medical History:  Diagnosis Date   Allergic rhinitis    Allergy    Asthma due to environmental allergies    a.  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   Bilateral carpal tunnel syndrome  12/08/2018   CAD, multiple vessel CARDIOLOGIST-  DR HILTY   a. NSTEMI 07/2013 - DES to mLCx, DES to prox OM1, DES to mLAD with nonobstructive RCA stenosis, EF 55-65%.   Cataract    Chronic kidney disease    kidney stones    Dyslipidemia    Family history of bone cancer    GERD (gastroesophageal reflux disease)    History of kidney stones    History of non-ST elevation myocardial infarction (NSTEMI)    07/ 2015  S/P  DES X3   History of palpitations    Hyperlipidemia    Hypertension    Insomnia 12/08/2018   x many years   Myocardial infarction (HCC) 2015   NSTEMI- 3 stents    OSA (obstructive sleep apnea)    cpap intolerant   Pneumonia due to COVID-19 virus    Right knee meniscal tear    S/P drug eluting coronary stent placement    07/ 2015  x3  to mLCFX, OM1, mLAD   Sinus bradycardia    Sleep apnea    no cpap    Past Surgical History:  Procedure Laterality Date   CATARACT EXTRACTION, BILATERAL     COLONOSCOPY     >10 yrs ago-polyps per pt- we have no report    EXTRACORPOREAL SHOCK WAVE LITHOTRIPSY  x2 last one 2005   EXTRACORPOREAL SHOCK WAVE LITHOTRIPSY Right 05/28/2021   Procedure: EXTRACORPOREAL SHOCK WAVE LITHOTRIPSY (ESWL);  Surgeon: Mellie Sprinkle., MD;  Location: [facility];  Service: Urology;  Laterality: Right;   KNEE ARTHROSCOPY Right 05/02/2015   Procedure: RIGHT KNEE ARTHROSCOPY WITH medial meniscal DEBRIDEMENT ;  Surgeon: Liliane Rei, MD;  [facility];  Service: Orthopedics;  Laterality: Right;   LEFT HEART CATHETERIZATION WITH CORONARY ANGIOGRAM N/A 08/05/2013   Procedure: LEFT HEART CATHETERIZATION WITH CORONARY ANGIOGRAM;  Surgeon: Arlander Bellman, MD;  Location: [facility];  Service: Cardiovascular;  Laterality: N/A;  Promus DES's to  mLCFx,  OM1,  mLAD (total 3)/   mRCA 30%,  dLM  20-30%,  preserved LVSF, ef 55-65%   LITHOTRIPSY     LUMBAR SPINE SURGERY  x3  last one 1980's   SHOULDER ARTHROSCOPY Right 2013   TONSILLECTOMY      FAMILY HISTORY Family History  Problem Relation Age of Onset   Coronary artery disease Mother        CABG   CAD Sister    Bone cancer Sister    Alzheimer's disease Father    Aneurysm Brother        brain   Heart failure Sister    Bone cancer Sister    Colon cancer Neg Hx    Colon polyps Neg Hx    Esophageal cancer Neg Hx  Rectal cancer Neg Hx    Stomach cancer Neg Hx    SOCIAL HISTORY Social History   Tobacco Use   Smoking status: Former    Current packs/day: 0.00    Types: Cigarettes    Start date: 04/26/1965    Quit date: 04/27/1966    Years since quitting: 57.1   Smokeless tobacco: Never  Vaping Use   Vaping status: Never Used  Substance Use Topics   Alcohol use: Yes    Comment: occasional   Drug use: No       OPHTHALMIC EXAM:  Base Eye Exam     Visual Acuity (Snellen - Linear)       Right Left   Dist [place] 20/25 -2 20/40 +1   Dist ph Maurice 20/25 +1 NI         Tonometry (Tonopen, 3:07 PM)       Right Left   Pressure 18 14         Pupils       Dark Light Shape React APD   Right 3 2 Round Brisk None   Left 3 2 Round Brisk None         Visual Fields (Counting fingers)       Left Right    Full Full         Extraocular Movement       Right Left    Full, Ortho Full, Ortho         Neuro/Psych     Oriented x3: Yes   Mood/Affect: Normal         Dilation     Both eyes: 1.0% Mydriacyl, 2.5% Phenylephrine @ 3:06 PM           Slit Lamp and Fundus Exam     External Exam       Right Left   External Normal Normal         Slit Lamp Exam       Right Left   Lids/Lashes Dermatochalasis - upper lid Dermatochalasis - upper lid   Conjunctiva/Sclera nasal pingeucula White and quiet   Cornea Arcus, trace tear film debris Arcus, tear film debris   Anterior Chamber Deep and clear Deep and clear   Iris Round and moderately dilated Round and moderately dilated   Lens PC IOL in good postition 2-3+ Nuclear sclerosis with  brunescence, 2-3+ Cortical cataract   Anterior Vitreous mild syneresis Vitreous syneresis         Fundus Exam       Right Left   Disc Pink and sharp, +fibrosis Pink and sharp   C/D Ratio 0.1 0.2   Macula Blunted foveal reflex, ERM with preretinal fibrosis, punctate central drusen, RPE mottling Flat, Blunted foveal reflex, mild ERM, no heme or edema   Vessels attenuated, Tortuous, mild copper wiring, fibrosis along IT arcades attenuated, Tortuous   Periphery Attached, No heme Attached, No heme           IMAGING AND PROCEDURES  Imaging and Procedures for 06/16/2023  OCT, Retina - OU - Both Eyes        Right Eye Quality was good. Central Foveal Thickness: 466. Progression has been stable. Findings include no IRF, no SRF, abnormal foveal contour, epiretinal membrane, macular pucker, preretinal fibrosis (ERM with pucker and thickening (cystic changes / schisis) -- stable).   Left Eye Quality was good. Central Foveal Thickness: 334. Progression has been stable. Findings include no IRF, no SRF, abnormal foveal contour, epiretinal membrane, macular  pucker, vitreomacular adhesion (Mild ERM with blunting of foveal contour and mild pucker).   Notes  *Images captured and stored on drive  Diagnosis / Impression:  OD: ERM with pucker and thickening (cystic changes / schisis) -- stable OS: Mild ERM with blunting of foveal contour and mild pucker  Clinical management:  See below  Abbreviations: NFP - Normal foveal profile. CME - cystoid macular edema. PED - pigment epithelial detachment. IRF - intraretinal fluid. SRF - subretinal fluid. EZ - ellipsoid zone. ERM - epiretinal membrane. ORA - outer retinal atrophy. ORT - outer retinal tubulation. SRHM - subretinal hyper-reflective material. IRHM - intraretinal hyper-reflective material           ASSESSMENT/PLAN:   ICD-10-CM   1. Visual disturbance  H53.9     2. Epiretinal membrane (ERM), bilateral  H35.373 OCT, Retina - OU - Both  Eyes    3. Essential hypertension  I10     4. Hypertensive retinopathy of both eyes  H35.033     5. Pseudophakia, right eye  Z96.1     6. Combined forms of age-related cataract of left eye  H25.812      Visual Disturbance, OU - episode of tunnel vision on 10.17.24 for 20-30 minutes - dilated exam on 10.18.24 without findings to explain visual symptoms / episode - today, pt reports no repeat episodes and dilated exam stable - suspect ?ocular migraine - discussed symptoms, prognosis, and treatment options of ocular migraine - recommend monitoring  2. Epiretinal membrane, both eyes (OD>OS) - OCT shows OD: ERM with pucker and thickening (cystic changes / schisis) -- stable; OS: Mild ERM with blunting of foveal contour and mild pucker - BCVA OD decreased to 20/25 from 20/20, OS decreased to 20/40 from 20/25 - pt reports minimal metamorphopsia - no indication for surgery at this time - monitor for now - f/u in 6 mos  3,4. Hypertensive retinopathy OU - discussed importance of tight BP control - monitor   5. Pseudophakia OD  - s/p CE/IOL w/ Dr. Lasandra Points  - IOL in good position, doing well  - monitor   6. Mixed Cataract OS - The symptoms of cataract, surgical options, and treatments and risks were discussed with patient. - discussed diagnosis and progression - BCVA OS 20/40 from 20/25 last year - recommend follow-up / re-evaluation w/ Dr. Lasandra Points  Ophthalmic Meds Ordered this visit:  No orders of the defined types were placed in this encounter.    Return in about 6 months (around 12/17/2023).  There are no Patient Instructions on file for this visit.  Explained the diagnoses, plan, and follow up with the patient and they expressed understanding.  Patient expressed understanding of the importance of proper follow up care.   This document serves as a record of services personally performed by Jeanice Millard, MD, PhD. It was created on their behalf by Olene Berne, COT an  ophthalmic technician. The creation of this record is the provider's dictation and/or activities during the visit.    Electronically signed by:  Olene Berne, COT  06/17/23 2:11 AM  This document serves as a record of services personally performed by Jeanice Millard, MD, PhD. It was created on their behalf by Morley Arabia. Bevin Bucks, OA an ophthalmic technician. The creation of this record is the provider's dictation and/or activities during the visit.    Electronically signed by: Morley Arabia. Bevin Bucks, OA 06/17/23 2:11 AM  Jeanice Millard, M.D., Ph.D. Diseases & Surgery of the Retina  and Vitreous [facility] 06/16/2023  I have reviewed the above documentation for accuracy and completeness, and I agree with the above. Jeanice Millard, M.D., Ph.D. 06/17/23 2:14 AM    Abbreviations: M myopia (nearsighted); A astigmatism; H hyperopia (farsighted); P presbyopia; Mrx spectacle prescription;  CTL contact lenses; OD right eye; OS left eye; OU both eyes  XT exotropia; ET esotropia; PEK punctate epithelial keratitis; PEE punctate epithelial erosions; DES dry eye syndrome; MGD meibomian gland dysfunction; ATs artificial tears; PFAT's preservative free artificial tears; NSC nuclear sclerotic cataract; PSC posterior subcapsular cataract; ERM epi-retinal membrane; PVD posterior vitreous detachment; RD retinal detachment; DM diabetes mellitus; DR diabetic retinopathy; NPDR non-proliferative diabetic retinopathy; PDR proliferative diabetic retinopathy; CSME clinically significant macular edema; DME diabetic macular edema; dbh dot blot hemorrhages; CWS cotton wool spot; POAG primary open angle glaucoma; C/D cup-to-disc ratio; HVF humphrey visual field; GVF goldmann visual field; OCT optical coherence tomography; IOP intraocular pressure; BRVO Branch retinal vein occlusion; CRVO central retinal vein occlusion; CRAO central retinal artery occlusion; BRAO branch retinal artery occlusion; RT retinal  tear; SB scleral buckle; PPV pars plana vitrectomy; VH Vitreous hemorrhage; PRP panretinal laser photocoagulation; IVK intravitreal kenalog; VMT vitreomacular traction; MH Macular hole;  NVD neovascularization of the disc; NVE neovascularization elsewhere; AREDS age related eye disease study; ARMD age related macular degeneration; POAG primary open angle glaucoma; EBMD epithelial/anterior basement membrane dystrophy; ACIOL anterior chamber intraocular lens; IOL intraocular lens; PCIOL posterior chamber intraocular lens; Phaco/IOL phacoemulsification with intraocular lens placement; PRK photorefractive keratectomy; LASIK laser assisted in situ keratomileusis; HTN hypertension; DM diabetes mellitus; COPD chronic obstructive pulmonary disease

## 2023-06-09 DIAGNOSIS — M25522 Pain in left elbow: Secondary | ICD-10-CM | POA: Diagnosis not present

## 2023-06-16 ENCOUNTER — Encounter (INDEPENDENT_AMBULATORY_CARE_PROVIDER_SITE_OTHER): Payer: Self-pay | Admitting: Ophthalmology

## 2023-06-16 ENCOUNTER — Ambulatory Visit (INDEPENDENT_AMBULATORY_CARE_PROVIDER_SITE_OTHER): Payer: Medicare HMO | Admitting: Ophthalmology

## 2023-06-16 DIAGNOSIS — H35373 Puckering of macula, bilateral: Secondary | ICD-10-CM

## 2023-06-16 DIAGNOSIS — H25812 Combined forms of age-related cataract, left eye: Secondary | ICD-10-CM

## 2023-06-16 DIAGNOSIS — H35033 Hypertensive retinopathy, bilateral: Secondary | ICD-10-CM | POA: Diagnosis not present

## 2023-06-16 DIAGNOSIS — Z961 Presence of intraocular lens: Secondary | ICD-10-CM | POA: Diagnosis not present

## 2023-06-16 DIAGNOSIS — H539 Unspecified visual disturbance: Secondary | ICD-10-CM

## 2023-06-16 DIAGNOSIS — I10 Essential (primary) hypertension: Secondary | ICD-10-CM | POA: Diagnosis not present

## 2023-06-17 ENCOUNTER — Encounter (INDEPENDENT_AMBULATORY_CARE_PROVIDER_SITE_OTHER): Payer: Self-pay | Admitting: Ophthalmology

## 2023-06-25 ENCOUNTER — Other Ambulatory Visit: Payer: Self-pay | Admitting: Family

## 2023-06-25 DIAGNOSIS — R6889 Other general symptoms and signs: Secondary | ICD-10-CM

## 2023-06-25 DIAGNOSIS — J4540 Moderate persistent asthma, uncomplicated: Secondary | ICD-10-CM

## 2023-06-26 NOTE — Telephone Encounter (Signed)
 Appt scheduled for 07/30/2023

## 2023-06-26 NOTE — Telephone Encounter (Signed)
 Christy NTBS in July for 6 mos FU

## 2023-07-06 ENCOUNTER — Other Ambulatory Visit: Payer: Self-pay | Admitting: Family

## 2023-07-06 DIAGNOSIS — R002 Palpitations: Secondary | ICD-10-CM

## 2023-07-06 DIAGNOSIS — J301 Allergic rhinitis due to pollen: Secondary | ICD-10-CM

## 2023-07-07 ENCOUNTER — Ambulatory Visit (HOSPITAL_COMMUNITY)
Admission: RE | Admit: 2023-07-07 | Discharge: 2023-07-07 | Disposition: A | Discharge status: Home / Self Care | Source: Ambulatory Visit | Attending: Urology | Admitting: Urology

## 2023-07-07 ENCOUNTER — Ambulatory Visit (INDEPENDENT_AMBULATORY_CARE_PROVIDER_SITE_OTHER): Admitting: Urology

## 2023-07-07 ENCOUNTER — Encounter: Payer: Self-pay | Admitting: Urology

## 2023-07-07 VITALS — BP 150/78 | HR 54

## 2023-07-07 DIAGNOSIS — N200 Calculus of kidney: Secondary | ICD-10-CM

## 2023-07-07 DIAGNOSIS — N401 Enlarged prostate with lower urinary tract symptoms: Secondary | ICD-10-CM

## 2023-07-07 DIAGNOSIS — R351 Nocturia: Secondary | ICD-10-CM

## 2023-07-07 DIAGNOSIS — N138 Other obstructive and reflux uropathy: Secondary | ICD-10-CM | POA: Diagnosis not present

## 2023-07-07 DIAGNOSIS — R109 Unspecified abdominal pain: Secondary | ICD-10-CM | POA: Insufficient documentation

## 2023-07-07 LAB — URINALYSIS, ROUTINE W REFLEX MICROSCOPIC
Bilirubin, UA: NEGATIVE
Glucose, UA: NEGATIVE
Ketones, UA: NEGATIVE
Leukocytes,UA: NEGATIVE
Nitrite, UA: NEGATIVE
Protein,UA: NEGATIVE
RBC, UA: NEGATIVE
Specific Gravity, UA: 1.015 (ref 1.005–1.030)
Urobilinogen, Ur: 1 mg/dL (ref 0.2–1.0)
pH, UA: 6.5 (ref 5.0–7.5)

## 2023-07-07 LAB — BLADDER SCAN AMB NON-IMAGING: Scan Result: 168

## 2023-07-07 NOTE — Progress Notes (Signed)
 Name: Ronald Berger DOB: Jun 15, 1945 MRN: [id number]  History of Present Illness: Ronald Berger is a 78 y.o. male who presents today for follow up visit at [facility] [place]. Relevant History includes: 1. BPH with nocturia.  - Taking Uroxatrol (Alfuzosin ) 10 mg twice per day. 2. Kidney stones. - 05/28/2021: ESWL procedure.  At last visit with Ronald Berger on 04/15/2023: Doing well.   Since last visit: 04/15/2023: KUB showed Increased left nephrolithiasis over the last year, largest stone measures 9 mm (previously 5 mm).  Today: He denies recent stone passage but states that 1-2 months ago he was seen in his local ER [facility]) for gross hematuria and was told that he was passing crystals in his urine. He denies having any imaging performed during that ER visit.   Today He reports bilateral flank pain (R > L) radiating to midline lower abdomen which began about 1 week ago. Over the past week he has also been having increased urinary urgency, frequency, nocturia x2, straining to void, and sensations of incomplete emptying. Reports solid urinary stream. Denies dysuria, gross hematuria, or hesitancy.  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 He acknowledges it's possible he may be experiencing musculoskeletal strain / muscle pull.  Medications: Current Outpatient Medications  Medication Sig Dispense Refill   albuterol  (VENTOLIN  HFA) 108 (90 Base) MCG/ACT inhaler INHALE 2 PUFFS INTO THE LUNGS EVERY 6 (SIX) HOURS AS NEEDED FOR WHEEZING OR SHORTNESS OF BREATH. 3 each 0   alfuzosin  (UROXATRAL ) 10 MG 24 hr tablet Take 1 tablet (10 mg total) by mouth in the morning and at bedtime. 180 tablet 3   aspirin  81 MG chewable tablet Chew 1 tablet (81 mg total) by mouth daily.     atorvastatin  (LIPITOR ) 40 MG tablet Take 1 tablet (40 mg total) by mouth daily. 90 tablet 3   benzonatate  (TESSALON ) 200 MG capsule Take 1 capsule (200 mg total) by  mouth 3 (three) times daily as needed. 30 capsule 1   cephALEXin  (KEFLEX ) 500 MG capsule Take 1 capsule (500 mg total) by mouth 2 (two) times daily. 14 capsule 0   diclofenac  (VOLTAREN ) 75 MG EC tablet Take 1 tablet (75 mg total) by mouth 2 (two) times daily. 180 tablet 3   diclofenac  Sodium (VOLTAREN ) 1 % GEL Apply 2 g topically 4 (four) times daily. 150 g 2   EPINEPHrine  0.3 mg/0.3 mL IJ SOAJ injection Inject 0.3 mg into the muscle as needed for anaphylaxis. 2 each 2   fluticasone  (FLONASE ) 50 MCG/ACT nasal spray Place 2 sprays into both nostrils daily. 48 g 3   fluticasone -salmeterol (ADVAIR) 250-50 MCG/ACT AEPB Inhale 1 puff into the lungs in the morning and at bedtime. 60 each 5   KRILL OIL PO Take by mouth.     levocetirizine (XYZAL ) 5 MG tablet TAKE 1 TABLET EVERY EVENING 90 tablet 3   methocarbamol  (ROBAXIN ) 500 MG tablet TAKE 1 TABLET EVERY 8 HOURS AS NEEDED FOR MUSCLE SPASM(S) 180 tablet 5   metoprolol  tartrate (LOPRESSOR ) 25 MG tablet TAKE 1/2 TABLET TWICE DAILY 90 tablet 0   Misc Natural Products (NEURIVA PO) Take by mouth.     montelukast  (SINGULAIR ) 10 MG tablet Take 1 tablet (10 mg total) by mouth at bedtime. 90 tablet 3   nitroGLYCERIN  (NITROSTAT ) 0.4 MG SL tablet DISSOLVE 1 TABLET (0.4 MG TOTAL) UNDER THE TONGUE EVERY 5 (FIVE) MINUTES AS NEEDED FOR CHEST PAIN. 25 tablet 11  omeprazole  (PRILOSEC) 20 MG capsule TAKE 1 CAPSULE EVERY EVENING 90 capsule 1   oseltamivir  (TAMIFLU ) 75 MG capsule Take 1 capsule (75 mg total) by mouth 2 (two) times daily. 10 capsule 0   predniSONE  (STERAPRED UNI-PAK 21 TAB) 10 MG (21) TBPK tablet Use as directed 21 tablet 0   traZODone  (DESYREL ) 50 MG tablet Take 1 tablet (50 mg total) by mouth at bedtime. 90 tablet 3   No current facility-administered medications for this visit.    Allergies: Allergies  Allergen Reactions   Bee Venom Shortness Of Breath   Oxycodone Nausea And Vomiting   Percocet [Oxycodone-Acetaminophen ] Nausea And Vomiting     Past Medical History:  Diagnosis Date   Allergic rhinitis    Allergy    Asthma due to environmental allergies    a.  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 NSTEMI 07/2013 - DES to mLCx, DES to prox OM1, DES to mLAD with nonobstructive RCA stenosis, EF 55-65%.   Cataract    Chronic kidney disease    kidney stones    Dyslipidemia    Family history of bone cancer    GERD (gastroesophageal reflux disease)    History of kidney stones    History of non-ST elevation myocardial infarction (NSTEMI)    07/ 2015  S/P  DES X3   History of palpitations    Hyperlipidemia    Hypertension    Insomnia 12/08/2018   x many years   Myocardial infarction (HCC) 2015   NSTEMI- 3 stents    OSA (obstructive sleep apnea)    cpap intolerant   Pneumonia due to COVID-19 virus    Right knee meniscal tear    S/P drug eluting coronary stent placement    07/ 2015  x3  to mLCFX, OM1, mLAD   Sinus bradycardia    Sleep apnea    no cpap    Past Surgical History:  Procedure Laterality Date   CATARACT EXTRACTION, BILATERAL     COLONOSCOPY     >10 yrs ago-polyps per pt- we have no report    EXTRACORPOREAL SHOCK WAVE LITHOTRIPSY  x2 last one 2005   EXTRACORPOREAL SHOCK WAVE LITHOTRIPSY Right 05/28/2021   Procedure: EXTRACORPOREAL SHOCK WAVE LITHOTRIPSY (ESWL);  Surgeon: Mellie Sprinkle., MD;  Location: [facility];  Service: Urology;  Laterality: Right;   KNEE ARTHROSCOPY Right 05/02/2015   Procedure: RIGHT KNEE ARTHROSCOPY WITH medial meniscal DEBRIDEMENT ;  Surgeon: Liliane Rei, MD;  Location: [facility];  Service: Orthopedics;  Laterality: Right;   LEFT HEART CATHETERIZATION WITH CORONARY ANGIOGRAM N/A 08/05/2013   Procedure: LEFT HEART CATHETERIZATION WITH CORONARY ANGIOGRAM;  Surgeon: Arlander Bellman, MD;  Location: [facility] CATH [facility];  Service:  Cardiovascular;  Laterality: N/A;  Promus DES's to  mLCFx,  OM1,  mLAD (total 3)/   mRCA 30%,  dLM  20-30%,  preserved LVSF, ef 55-65%   LITHOTRIPSY     LUMBAR SPINE SURGERY  x3  last one 1980's   SHOULDER ARTHROSCOPY Right 2013   TONSILLECTOMY     Family History  Problem Relation Age of Onset   Coronary artery disease Mother        CABG   CAD Sister    Bone cancer Sister    Alzheimer's disease Father    Aneurysm Brother        brain  Heart failure Sister    Bone cancer Sister    Colon cancer Neg Hx    Colon polyps Neg Hx    Esophageal cancer Neg Hx    Rectal cancer Neg Hx    Stomach cancer Neg Hx    Social History   Socioeconomic History   Marital status: Married    Spouse name: Jullie Oiler   Number of children: 1   Years of education: 12   Highest education level: Associate degree: occupational, Scientist, product/process development, or vocational program  Occupational History   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  Tobacco Use   Smoking status: Former    Current packs/day: 0.00    Types: Cigarettes    Start date: 04/26/1965    Quit date: 04/27/1966    Years since quitting: 57.2   Smokeless tobacco: Never  Vaping Use   Vaping status: Never Used  Substance and Sexual Activity   Alcohol use: Yes    Comment: occasional   Drug use: No   Sexual activity: Yes  Other Topics Concern   Not on file  Social History Narrative   Son lives with them   One level home   Social Drivers of Health   Financial Resource Strain: Low Risk  (02/19/2023)   Overall Financial Resource Strain (CARDIA)    Difficulty of Paying Living Expenses: Not hard at all  Food Insecurity: No Food Insecurity (02/19/2023)   Hunger Vital Sign    Worried About Running Out of Food in the Last Year: Never true    Ran Out of Food in the Last Year: Never true  Transportation Needs: No Transportation Needs (02/19/2023)   PRAPARE - Administrator, Civil Service (Medical): No    Lack  of Transportation (Non-Medical): No  Physical Activity: Sufficiently Active (02/19/2023)   Exercise Vital Sign    Days of Exercise per Week: 5 days    Minutes of Exercise per Session: 150+ min  Stress: No Stress Concern Present (02/19/2023)   [employer] of Occupational Health - Occupational Stress Questionnaire    Feeling of Stress : Not at all  Social Connections: Unknown (02/19/2023)   Social Connection and Isolation Panel    Frequency of Communication with Friends and Family: More than three times a week    Frequency of Social Gatherings with Friends and Family: Patient declined    Attends Religious Services: Patient declined    Database administrator or Organizations: No    Attends Engineer, structural: Not on file    Marital Status: Married  Catering manager Violence: Not At Risk (04/07/2022)   Humiliation, Afraid, Rape, and Kick questionnaire    Fear of Current or Ex-Partner: No    Emotionally Abused: No    Physically Abused: No    Sexually Abused: No    SUBJECTIVE  Review of Systems Constitutional: Patient denies any unintentional weight loss or change in strength lntegumentary: Patient denies any rashes or pruritus Cardiovascular: Patient denies chest pain or syncope Respiratory: Patient denies shortness of breath Gastrointestinal: Patient denies nausea, vomiting, constipation, or diarrhea  Musculoskeletal: Patient denies muscle cramps or weakness Neurologic: Patient denies convulsions or seizures Allergic/Immunologic: Patient denies recent allergic reaction(s) Hematologic/Lymphatic: Patient denies bleeding tendencies Endocrine: Patient denies heat/cold intolerance  GU: As per HPI.  OBJECTIVE Vitals:   07/07/23 1031  BP: (!) 150/78  Pulse: (!) 54   There is no height or weight on file to calculate BMI.  Physical  Examination Constitutional: No obvious distress; patient is non-toxic appearing  Cardiovascular: No visible lower extremity edema.   Respiratory: The patient does not have audible wheezing/stridor; respirations do not appear labored  Gastrointestinal: Abdomen non-distended Musculoskeletal: Normal ROM of UEs  Skin: No obvious rashes/open sores  Neurologic: CN 2-12 grossly intact Psychiatric: Answered questions appropriately with normal affect  Hematologic/Lymphatic/Immunologic: No obvious bruises or sites of spontaneous bleeding  UA: negative  PVR: 168 ml  ASSESSMENT Benign prostatic hyperplasia with urinary obstruction - Plan: Urinalysis, Routine w reflex microscopic, BLADDER SCAN AMB NON-IMAGING  Kidney stones - Plan: Urinalysis, Routine w reflex microscopic, BLADDER SCAN AMB NON-IMAGING, DG Abd 1 View  Nocturia - Plan: Urinalysis, Routine w reflex microscopic, BLADDER SCAN AMB NON-IMAGING  Bilateral flank pain - Plan: DG Abd 1 View  We agreed to obtain updated KUB to assess for possible stone migration, although discussed more likely musculoskeletal strain based on bilateral nature of his pain. Will plan to follow up in 6 months KUB for stone surveillance or sooner if needed. Patient verbalized understanding of and agreement with current plan. All questions were answered.  PLAN Advised the following: KUB today. OTC analgesics PRN. Return in about 6 months (around 01/06/2024) for Stone, BPH, with UA & PVR, needs KUB prior.  Orders Placed This Encounter  Procedures   DG Abd 1 View    Standing Status:   Future    Number of Occurrences:   1    Expected Date:   07/07/2023    Expiration Date:   07/06/2024    Reason for Exam (SYMPTOM  OR DIAGNOSIS REQUIRED):   kidney stone    Preferred imaging location?:   [facility]   Urinalysis, Routine w reflex microscopic   BLADDER SCAN AMB NON-IMAGING    It has been explained that the patient is to follow regularly with their PCP in addition to all other providers involved in their care and to follow instructions provided by these respective offices. Patient  advised to contact urology clinic if any urologic-pertaining questions, concerns, new symptoms or problems arise in the interim period.  There are no Patient Instructions on file for this visit.  Electronically signed by:  Lauretta Ponto, MSN, FNP-C, CUNP 07/07/2023 12:47 PM

## 2023-07-08 ENCOUNTER — Telehealth: Payer: Self-pay

## 2023-07-08 NOTE — Telephone Encounter (Signed)
-----   Message from Lauretta Ponto sent at 07/08/2023  8:38 AM EDT ----- Please let patient know that I looked at his KUB from yesterday. Awaiting radiology read; stone burden appears stable to me - I don't see any evidence of stone passing at this time.

## 2023-07-08 NOTE — Telephone Encounter (Signed)
 Patient is made aware via detailed voiced message.

## 2023-07-14 ENCOUNTER — Ambulatory Visit: Payer: Self-pay | Admitting: Urology

## 2023-07-14 ENCOUNTER — Other Ambulatory Visit: Payer: Self-pay | Admitting: Family

## 2023-07-14 DIAGNOSIS — J4530 Mild persistent asthma, uncomplicated: Secondary | ICD-10-CM

## 2023-07-14 NOTE — Progress Notes (Signed)
 Please let patient know that his KUB read confirmed no stone migration. As discussed his pain is likely musculoskeletal strain related to work. Advised to f/u with PCP if it fails to improve / resolve.

## 2023-07-25 ENCOUNTER — Other Ambulatory Visit: Payer: Self-pay | Admitting: Family

## 2023-07-25 DIAGNOSIS — K219 Gastro-esophageal reflux disease without esophagitis: Secondary | ICD-10-CM

## 2023-07-30 ENCOUNTER — Ambulatory Visit: Admitting: Family

## 2023-07-30 ENCOUNTER — Encounter: Payer: Self-pay | Admitting: Family

## 2023-07-30 VITALS — BP 135/76 | HR 60 | Temp 97.6°F | Ht 69.0 in | Wt 221.4 lb

## 2023-07-30 DIAGNOSIS — M542 Cervicalgia: Secondary | ICD-10-CM

## 2023-07-30 DIAGNOSIS — Z0001 Encounter for general adult medical examination with abnormal findings: Secondary | ICD-10-CM

## 2023-07-30 DIAGNOSIS — F5101 Primary insomnia: Secondary | ICD-10-CM

## 2023-07-30 DIAGNOSIS — I252 Old myocardial infarction: Secondary | ICD-10-CM

## 2023-07-30 DIAGNOSIS — E785 Hyperlipidemia, unspecified: Secondary | ICD-10-CM | POA: Diagnosis not present

## 2023-07-30 DIAGNOSIS — R7303 Prediabetes: Secondary | ICD-10-CM

## 2023-07-30 DIAGNOSIS — I10 Essential (primary) hypertension: Secondary | ICD-10-CM | POA: Diagnosis not present

## 2023-07-30 DIAGNOSIS — J4541 Moderate persistent asthma with (acute) exacerbation: Secondary | ICD-10-CM | POA: Diagnosis not present

## 2023-07-30 DIAGNOSIS — Z0000 Encounter for general adult medical examination without abnormal findings: Secondary | ICD-10-CM | POA: Diagnosis not present

## 2023-07-30 DIAGNOSIS — I2510 Atherosclerotic heart disease of native coronary artery without angina pectoris: Secondary | ICD-10-CM | POA: Diagnosis not present

## 2023-07-30 DIAGNOSIS — G4733 Obstructive sleep apnea (adult) (pediatric): Secondary | ICD-10-CM

## 2023-07-30 DIAGNOSIS — K219 Gastro-esophageal reflux disease without esophagitis: Secondary | ICD-10-CM

## 2023-07-30 LAB — BAYER DCA HB A1C WAIVED: HB A1C (BAYER DCA - WAIVED): 5.7 % — ABNORMAL HIGH (ref 4.8–5.6)

## 2023-07-30 MED ORDER — DICLOFENAC SODIUM 75 MG PO TBEC: 75.0000 mg | DELAYED_RELEASE_TABLET | Freq: Two times a day (BID) | ORAL | 3 refills | Status: DC

## 2023-07-30 MED ORDER — DICLOFENAC SODIUM 1 % EX GEL: 2.0000 g | Freq: Four times a day (QID) | CUTANEOUS | 2 refills | Status: DC

## 2023-07-30 NOTE — Progress Notes (Signed)
 Subjective:    Patient ID: Ronald Berger, male    DOB: 13-Aug-1945, 78 y.o.   MRN: [id number]  Chief Complaint  Patient presents with   Medical Management of Chronic Issues   Pt presents to the office today CPE and chronic follow up.   He is followed by nephrologists for every 6 months for kidneys stones.   Followed by Ortho every 6 month for chronic neck pain. Getting steroid injections.    He has CAD and has hs NSTEMI and takes atorvastatin  daily. Followed by Cardiologists every other year.    Has OSA, but does not wear CPAP. Has been years since last sleep study.  Hypertension This is a chronic problem. The current episode started more than 1 year ago. The problem has been resolved since onset. The problem is controlled. Associated symptoms include malaise/fatigue, neck pain and peripheral edema (some times). Pertinent negatives include no blurred vision or shortness of breath. Risk factors for coronary artery disease include obesity, male gender and dyslipidemia. The current treatment provides moderate improvement.  Asthma He complains of wheezing. There is no cough or shortness of breath. This is a chronic problem. The current episode started more than 1 year ago. The problem occurs intermittently. The problem has been waxing and waning. Associated symptoms include heartburn and malaise/fatigue. His symptoms are alleviated by beta-agonist and leukotriene antagonist. He reports moderate improvement on treatment. His past medical history is significant for asthma.  Gastroesophageal Reflux He complains of belching, heartburn and wheezing. He reports no coughing. This is a chronic problem. The current episode started more than 1 year ago. The problem occurs rarely. The symptoms are aggravated by certain foods. Risk factors include obesity. He has tried a PPI for the symptoms. The treatment provided moderate relief.  Hyperlipidemia This is a chronic problem. The current episode started  more than 1 year ago. The problem is controlled. Recent lipid tests were reviewed and are normal. Exacerbating diseases include obesity. Pertinent negatives include no shortness of breath. Current antihyperlipidemic treatment includes statins. The current treatment provides moderate improvement of lipids. Risk factors for coronary artery disease include dyslipidemia, diabetes mellitus, male sex and hypertension.  Diabetes He presents for his follow-up diabetic visit. Diabetes type: prediabetic. Pertinent negatives for diabetes include no blurred vision and no foot paresthesias. Symptoms are stable. Risk factors for coronary artery disease include hypertension and male sex. He is following a generally healthy diet. (Does not check at home)  Neck Pain  This is a chronic problem. The current episode started more than 1 year ago. The problem occurs intermittently. The problem has been waxing and waning. The pain is at a severity of 3/10. The pain is mild.  Insomnia Primary symptoms: difficulty falling asleep, frequent awakening, malaise/fatigue.   Past treatments include medication. The treatment provided moderate relief.      Review of Systems  Constitutional:  Positive for malaise/fatigue.  Eyes:  Negative for blurred vision.  Respiratory:  Positive for wheezing. Negative for cough and shortness of breath.   Gastrointestinal:  Positive for heartburn.  Musculoskeletal:  Positive for neck pain.  All other systems reviewed and are negative.  Family History  Problem Relation Age of Onset   Coronary artery disease Mother        CABG   CAD Sister    Bone cancer Sister    Alzheimer's disease Father    Aneurysm Brother        brain   Heart failure Sister  Bone cancer Sister    Colon cancer Neg Hx    Colon polyps Neg Hx    Esophageal cancer Neg Hx    Rectal cancer Neg Hx    Stomach cancer Neg Hx    Social History   Socioeconomic History   Marital status: Married    Spouse name:  Suzen   Number of children: 1   Years of education: 12   Highest education level: Associate degree: occupational, Scientist, product/process development, or vocational program  Occupational History   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  Tobacco Use   Smoking status: Former    Current packs/day: 0.00    Types: Cigarettes    Start date: 04/26/1965    Quit date: 04/27/1966    Years since quitting: 57.2   Smokeless tobacco: Never  Vaping Use   Vaping status: Never Used  Substance and Sexual Activity   Alcohol use: Yes    Comment: occasional   Drug use: No   Sexual activity: Yes  Other Topics Concern   Not on file  Social History Narrative   Son lives with them   One level home   Social Drivers of Health   Financial Resource Strain: Low Risk  (02/19/2023)   Overall Financial Resource Strain (CARDIA)    Difficulty of Paying Living Expenses: Not hard at all  Food Insecurity: No Food Insecurity (02/19/2023)   Hunger Vital Sign    Worried About Running Out of Food in the Last Year: Never true    Ran Out of Food in the Last Year: Never true  Transportation Needs: No Transportation Needs (02/19/2023)   PRAPARE - Administrator, Civil Service (Medical): No    Lack of Transportation (Non-Medical): No  Physical Activity: Sufficiently Active (02/19/2023)   Exercise Vital Sign    Days of Exercise per Week: 5 days    Minutes of Exercise per Session: 150+ min  Stress: No Stress Concern Present (02/19/2023)   [employer] of Occupational Health - Occupational Stress Questionnaire    Feeling of Stress : Not at all  Social Connections: Unknown (02/19/2023)   Social Connection and Isolation Panel    Frequency of Communication with Friends and Family: More than three times a week    Frequency of Social Gatherings with Friends and Family: Patient declined    Attends Religious Services: Patient declined    Database administrator or Organizations: No    Attends Probation officer: Not on file    Marital Status: Married       Objective:   Physical Exam Vitals reviewed.  Constitutional:      General: He is not in acute distress.    Appearance: He is well-developed. He is obese.  HENT:     Head: Normocephalic.     Right Ear: Tympanic membrane normal.     Left Ear: Tympanic membrane normal.  Eyes:     General:        Right eye: No discharge.        Left eye: No discharge.     Pupils: Pupils are equal, round, and reactive to light.  Neck:     Thyroid : No thyromegaly.  Cardiovascular:     Rate and Rhythm: Normal rate and regular rhythm.     Heart sounds: Normal heart sounds. No murmur heard. Pulmonary:     Effort: Pulmonary effort is normal. No respiratory distress.     Breath sounds: Normal  breath sounds. No wheezing.  Abdominal:     General: Bowel sounds are normal. There is no distension.     Palpations: Abdomen is soft.     Tenderness: There is no abdominal tenderness.  Musculoskeletal:        General: No tenderness. Normal range of motion.     Cervical back: Normal range of motion and neck supple.  Skin:    General: Skin is warm and dry.     Findings: No erythema or rash.  Neurological:     Mental Status: He is alert and oriented to person, place, and time.     Cranial Nerves: No cranial nerve deficit.     Deep Tendon Reflexes: Reflexes are normal and symmetric.  Psychiatric:        Behavior: Behavior normal.        Thought Content: Thought content normal.        Judgment: Judgment normal.       BP 135/76   Pulse 60   Temp 97.6 F (36.4 C) (Temporal)   Ht 5' 9 (1.753 m)   Wt 221 lb 6.4 oz (100.4 kg)   BMI 32.70 kg/m      Assessment & Plan:  LASHON HILLIER comes in today with chief complaint of Medical Management of Chronic Issues   Diagnosis and orders addressed:  1. Cervicalgia - diclofenac  (VOLTAREN ) 75 MG EC tablet; Take 1 tablet (75 mg total) by mouth 2 (two) times daily.  Dispense: 180  tablet; Refill: 3 - diclofenac  Sodium (VOLTAREN ) 1 % GEL; Apply 2 g topically 4 (four) times daily.  Dispense: 150 g; Refill: 2 - CMP14+EGFR - CBC with Differential/Platelet  2. Annual physical exam (Primary) - CMP14+EGFR - CBC with Differential/Platelet - Lipid panel - PSA, total and free  3. Primary hypertension - CMP14+EGFR - CBC with Differential/Platelet  4. Moderate persistent asthma with exacerbation  - CMP14+EGFR - CBC with Differential/Platelet  5. Primary insomnia - CMP14+EGFR - CBC with Differential/Platelet  6. Gastroesophageal reflux disease, unspecified whether esophagitis present  - CMP14+EGFR - CBC with Differential/Platelet  7. History of non-ST elevation myocardial infarction (NSTEMI)  - CMP14+EGFR - CBC with Differential/Platelet  8. CAD in native artery - CMP14+EGFR - CBC with Differential/Platelet  9. Hyperlipidemia LDL goal <70  - CMP14+EGFR - CBC with Differential/Platelet - Lipid panel  10. OSA (obstructive sleep apnea)  - CMP14+EGFR - CBC with Differential/Platelet  11. Prediabetes - CMP14+EGFR - CBC with Differential/Platelet   Labs pending Continue current medications  Keep follow up with specialists  Health Maintenance reviewed Diet and exercise encouraged  Follow up plan: 6 months    Bari Learn, FNP

## 2023-07-30 NOTE — Patient Instructions (Signed)
 Health Maintenance After Age 76 After age 4, you are at a higher risk for certain long-term diseases and infections as well as injuries from falls. Falls are a major cause of broken bones and head injuries in people who are older than age 47. Getting regular preventive care can help to keep you healthy and well. Preventive care includes getting regular testing and making lifestyle changes as recommended by your health care provider. Talk with your health care provider about: Which screenings and tests you should have. A screening is a test that checks for a disease when you have no symptoms. A diet and exercise plan that is right for you. What should I know about screenings and tests to prevent falls? Screening and testing are the best ways to find a health problem early. Early diagnosis and treatment give you the best chance of managing medical conditions that are common after age 37. Certain conditions and lifestyle choices may make you more likely to have a fall. Your health care provider may recommend: Regular vision checks. Poor vision and conditions such as cataracts can make you more likely to have a fall. If you wear glasses, make sure to get your prescription updated if your vision changes. Medicine review. Work with your health care provider to regularly review all of the medicines you are taking, including over-the-counter medicines. Ask your health care provider about any side effects that may make you more likely to have a fall. Tell your health care provider if any medicines that you take make you feel dizzy or sleepy. Strength and balance checks. Your health care provider may recommend certain tests to check your strength and balance while standing, walking, or changing positions. Foot health exam. Foot pain and numbness, as well as not wearing proper footwear, can make you more likely to have a fall. Screenings, including: Osteoporosis screening. Osteoporosis is a condition that causes  the bones to get weaker and break more easily. Blood pressure screening. Blood pressure changes and medicines to control blood pressure can make you feel dizzy. Depression screening. You may be more likely to have a fall if you have a fear of falling, feel depressed, or feel unable to do activities that you used to do. Alcohol use screening. Using too much alcohol can affect your balance and may make you more likely to have a fall. Follow these instructions at home: Lifestyle Do not drink alcohol if: Your health care provider tells you not to drink. If you drink alcohol: Limit how much you have to: 0-1 drink a day for women. 0-2 drinks a day for men. Know how much alcohol is in your drink. In the [place] one drink equals one 12 oz bottle of beer (355 mL), one 5 oz glass of wine (148 mL), or one 1 oz glass of hard liquor (44 mL). Do not use any products that contain nicotine or tobacco. These products include cigarettes, chewing tobacco, and vaping devices, such as e-cigarettes. If you need help quitting, ask your health care provider. Activity  Follow a regular exercise program to stay fit. This will help you maintain your balance. Ask your health care provider what types of exercise are appropriate for you. If you need a cane or walker, use it as recommended by your health care provider. Wear supportive shoes that have nonskid soles. Safety  Remove any tripping hazards, such as rugs, cords, and clutter. Install safety equipment such as grab bars in bathrooms and safety rails on stairs. Keep rooms and walkways  well-lit. General instructions Talk with your health care provider about your risks for falling. Tell your health care provider if: You fall. Be sure to tell your health care provider about all falls, even ones that seem minor. You feel dizzy, tiredness (fatigue), or off-balance. Take over-the-counter and prescription medicines only as told by your health care provider. These include  supplements. Eat a healthy diet and maintain a healthy weight. A healthy diet includes low-fat dairy products, low-fat (lean) meats, and fiber from whole grains, beans, and lots of fruits and vegetables. Stay current with your vaccines. Schedule regular health, dental, and eye exams. Summary Having a healthy lifestyle and getting preventive care can help to protect your health and wellness after age 11. Screening and testing are the best way to find a health problem early and help you avoid having a fall. Early diagnosis and treatment give you the best chance for managing medical conditions that are more common for people who are older than age 28. Falls are a major cause of broken bones and head injuries in people who are older than age 48. Take precautions to prevent a fall at home. Work with your health care provider to learn what changes you can make to improve your health and wellness and to prevent falls. This information is not intended to replace advice given to you by your health care provider. Make sure you discuss any questions you have with your health care provider. Document Revised: 05/28/2020 Document Reviewed: 05/28/2020 Elsevier Patient Education  2024 [employer].

## 2023-07-31 ENCOUNTER — Telehealth: Payer: Self-pay | Admitting: *Deleted

## 2023-07-31 ENCOUNTER — Ambulatory Visit: Payer: Self-pay | Admitting: Family

## 2023-07-31 DIAGNOSIS — M542 Cervicalgia: Secondary | ICD-10-CM

## 2023-07-31 LAB — CMP14+EGFR
ALT: 36 IU/L (ref 0–44)
AST: 25 IU/L (ref 0–40)
Albumin: 4 g/dL (ref 3.8–4.8)
Alkaline Phosphatase: 106 IU/L (ref 44–121)
BUN/Creatinine Ratio: 18 (ref 10–24)
BUN: 20 mg/dL (ref 8–27)
Bilirubin Total: 0.4 mg/dL (ref 0.0–1.2)
CO2: 22 mmol/L (ref 20–29)
Calcium: 9.2 mg/dL (ref 8.6–10.2)
Chloride: 105 mmol/L (ref 96–106)
Creatinine, Ser: 1.14 mg/dL (ref 0.76–1.27)
Globulin, Total: 2.5 g/dL (ref 1.5–4.5)
Glucose: 83 mg/dL (ref 70–99)
Potassium: 4.7 mmol/L (ref 3.5–5.2)
Sodium: 141 mmol/L (ref 134–144)
Total Protein: 6.5 g/dL (ref 6.0–8.5)
eGFR: 67 mL/min/1.73 (ref >59)

## 2023-07-31 LAB — CBC WITH DIFFERENTIAL/PLATELET
Basophils Absolute: 0.1 x10E3/uL (ref 0.0–0.2)
Basos: 1 %
EOS (ABSOLUTE): 0.3 x10E3/uL (ref 0.0–0.4)
Eos: 4 %
Hematocrit: 44.5 % (ref 37.5–51.0)
Hemoglobin: 14.7 g/dL (ref 13.0–17.7)
Immature Grans (Abs): 0 x10E3/uL (ref 0.0–0.1)
Immature Granulocytes: 0 %
Lymphocytes Absolute: 3 x10E3/uL (ref 0.7–3.1)
Lymphs: 35 %
MCH: 28.9 pg (ref 26.6–33.0)
MCHC: 33 g/dL (ref 31.5–35.7)
MCV: 88 fL (ref 79–97)
Monocytes Absolute: 0.8 x10E3/uL (ref 0.1–0.9)
Monocytes: 9 %
Neutrophils Absolute: 4.4 x10E3/uL (ref 1.4–7.0)
Neutrophils: 51 %
Platelets: 229 x10E3/uL (ref 150–450)
RBC: 5.08 x10E6/uL (ref 4.14–5.80)
RDW: 12 % (ref 11.6–15.4)
WBC: 8.7 x10E3/uL (ref 3.4–10.8)

## 2023-07-31 LAB — LIPID PANEL
Chol/HDL Ratio: 3.6 ratio (ref 0.0–5.0)
Cholesterol, Total: 131 mg/dL (ref 100–199)
HDL: 36 mg/dL — ABNORMAL LOW (ref >39)
LDL Chol Calc (NIH): 62 mg/dL (ref 0–99)
Triglycerides: 198 mg/dL — ABNORMAL HIGH (ref 0–149)
VLDL Cholesterol Cal: 33 mg/dL (ref 5–40)

## 2023-07-31 LAB — PSA, TOTAL AND FREE
PSA, Free Pct: 30.1 %
PSA, Free: 2.02 ng/mL
Prostate Specific Ag, Serum: 6.7 ng/mL — ABNORMAL HIGH (ref 0.0–4.0)

## 2023-07-31 MED ORDER — DICLOFENAC SODIUM 75 MG PO TBEC: 75.0000 mg | DELAYED_RELEASE_TABLET | Freq: Two times a day (BID) | ORAL | 3 refills | Status: AC

## 2023-07-31 MED ORDER — DICLOFENAC SODIUM 1 % EX GEL: 2.0000 g | Freq: Four times a day (QID) | CUTANEOUS | 2 refills | Status: AC

## 2023-07-31 NOTE — Telephone Encounter (Signed)
 Informed pt that medication was an anti-inflammatory for his neck issue that he saw his PCP for yesterday and that I sent them to [facility].    Copied from CRM [id number]. Topic: Clinical - Prescription Issue >> Jul 31, 2023  8:08 AM Willma R wrote: Reason for CRM: Patient calling in regards to refills of diclofenac  Sodium (VOLTAREN ) 1 % GEL & diclofenac  (VOLTAREN ) 75 MG EC tablet. They were sent to a pharmacy in [place] but her would like them sent to:  [facility] - [place], [place] - [address] [place] [address] Phone: [phone] Fax: [phone]  Patient would like a call to confirm what the medications are for.  Patent can be reached at [phone]

## 2023-08-06 ENCOUNTER — Telehealth

## 2023-08-15 IMAGING — DX DG ABDOMEN 1V
2 series · 2 of 2 positions shown · non-contrast
Comparison: May 28, 2021

CLINICAL DATA: Asymptomatic. Follow-up, status post lithotripsy on

EXAM:
ABDOMEN - 1 VIEW

[abdomen kub (1 of 2)]
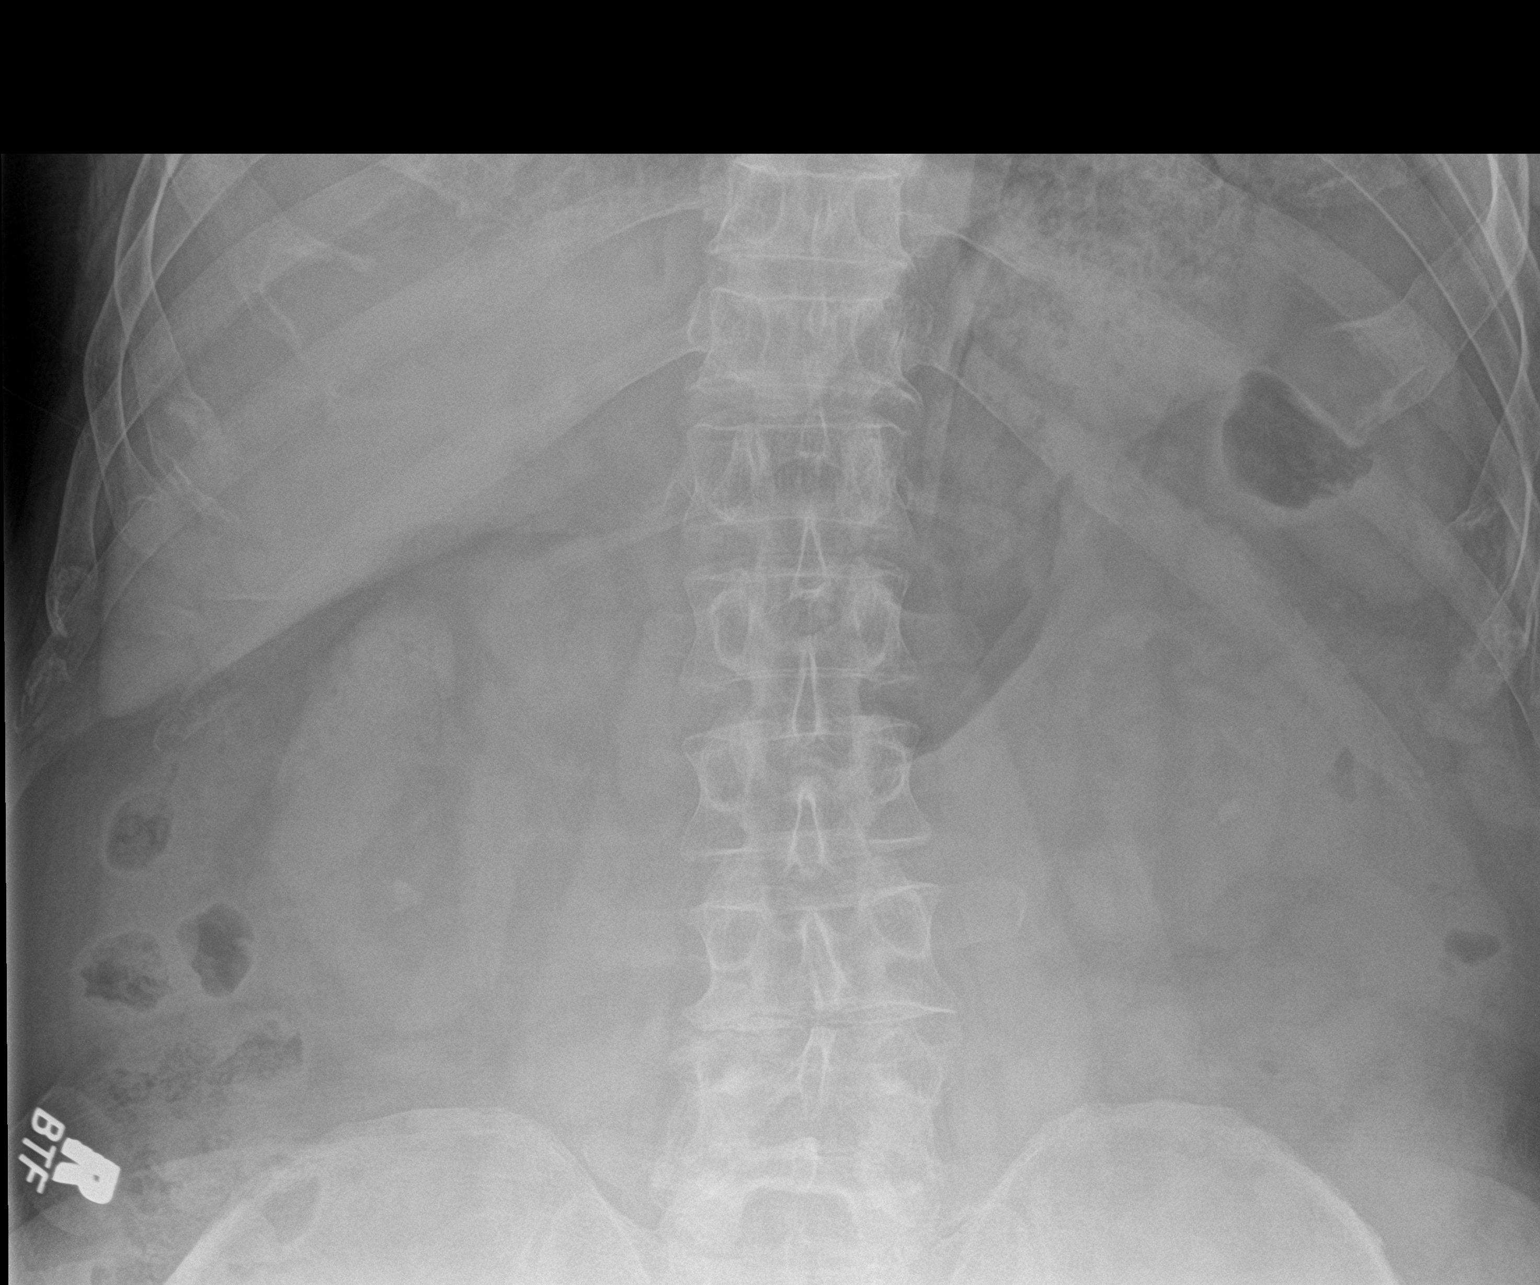

[abdomen kub (2 of 2)]
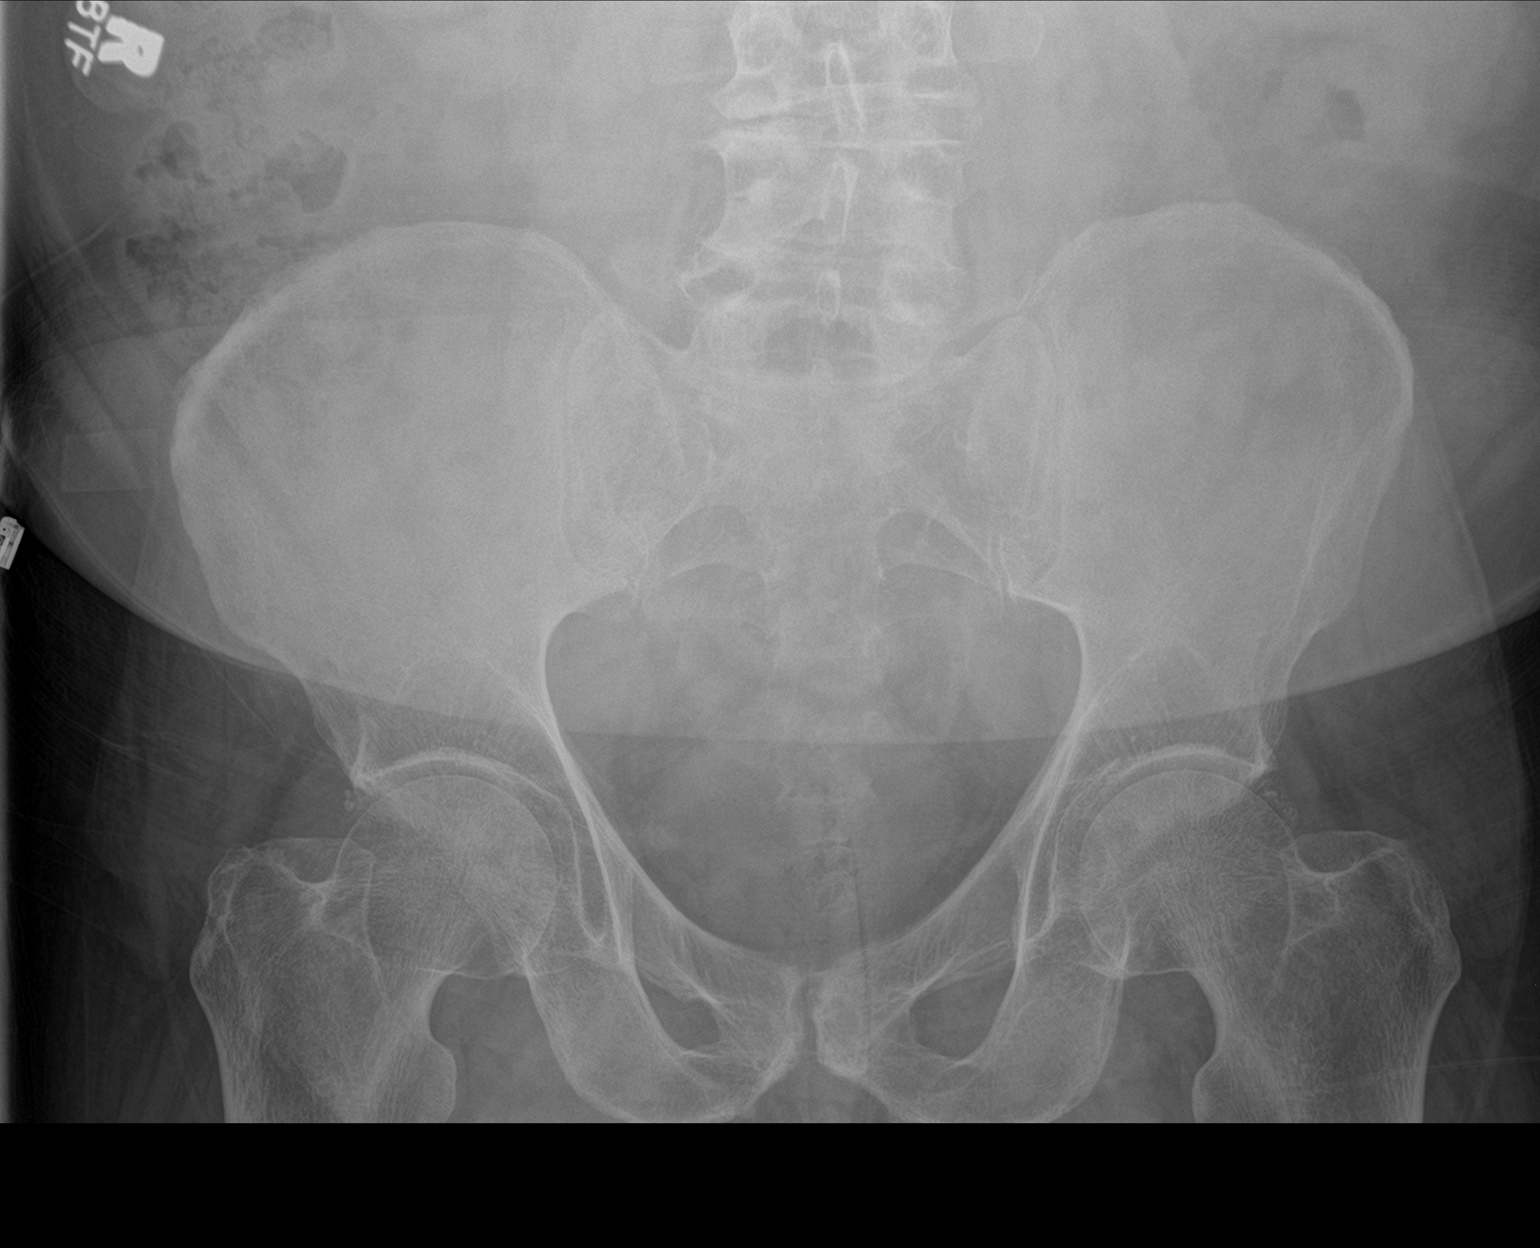

[2 of 2 positions shown; findings below may reference images not displayed]

FINDINGS: The bowel gas pattern is normal. A stable 10 mm soft tissue
calcification is seen projecting over the lower pole of the right
kidney. An additional stable 5 mm soft tissue calcification is seen
projecting over the left kidney.
IMPRESSION: Stable bilateral renal calculi.

## 2023-08-28 DIAGNOSIS — H524 Presbyopia: Secondary | ICD-10-CM | POA: Diagnosis not present

## 2023-08-28 DIAGNOSIS — H52213 Irregular astigmatism, bilateral: Secondary | ICD-10-CM | POA: Diagnosis not present

## 2023-08-28 DIAGNOSIS — H2512 Age-related nuclear cataract, left eye: Secondary | ICD-10-CM | POA: Diagnosis not present

## 2023-08-28 DIAGNOSIS — H5703 Miosis: Secondary | ICD-10-CM | POA: Diagnosis not present

## 2023-08-31 ENCOUNTER — Other Ambulatory Visit: Payer: Self-pay | Admitting: Family

## 2023-08-31 DIAGNOSIS — R6889 Other general symptoms and signs: Secondary | ICD-10-CM

## 2023-08-31 DIAGNOSIS — J4540 Moderate persistent asthma, uncomplicated: Secondary | ICD-10-CM

## 2023-09-17 ENCOUNTER — Other Ambulatory Visit: Payer: Self-pay | Admitting: Family

## 2023-09-17 DIAGNOSIS — R002 Palpitations: Secondary | ICD-10-CM

## 2023-09-30 ENCOUNTER — Other Ambulatory Visit: Payer: Self-pay | Admitting: Family

## 2023-09-30 DIAGNOSIS — J4530 Mild persistent asthma, uncomplicated: Secondary | ICD-10-CM

## 2023-10-05 ENCOUNTER — Ambulatory Visit: Admitting: Family Medicine

## 2023-10-05 ENCOUNTER — Ambulatory Visit (INDEPENDENT_AMBULATORY_CARE_PROVIDER_SITE_OTHER)

## 2023-10-05 ENCOUNTER — Encounter: Payer: Self-pay | Admitting: Family Medicine

## 2023-10-05 VITALS — BP 139/80 | HR 48 | Ht 69.0 in | Wt 213.0 lb

## 2023-10-05 DIAGNOSIS — M170 Bilateral primary osteoarthritis of knee: Secondary | ICD-10-CM | POA: Diagnosis not present

## 2023-10-05 DIAGNOSIS — Z23 Encounter for immunization: Secondary | ICD-10-CM

## 2023-10-05 DIAGNOSIS — M25461 Effusion, right knee: Secondary | ICD-10-CM | POA: Diagnosis not present

## 2023-10-05 DIAGNOSIS — M7989 Other specified soft tissue disorders: Secondary | ICD-10-CM | POA: Diagnosis not present

## 2023-10-05 DIAGNOSIS — M25561 Pain in right knee: Secondary | ICD-10-CM

## 2023-10-05 MED ORDER — METHYLPREDNISOLONE ACETATE 80 MG/ML IJ SUSP
80.0000 mg | Freq: Once | INTRAMUSCULAR | Status: AC
  Administered 2023-10-05: 80 mg via INTRA_ARTICULAR

## 2023-10-05 NOTE — Progress Notes (Signed)
 BP 139/80   Pulse (!) 48   Ht 5' 9 (1.753 m)   Wt 213 lb (96.6 kg)   SpO2 98%   BMI 31.45 kg/m    Subjective:   Patient ID: Ronald Berger, male    DOB: 09/15/1945, 78 y.o.   MRN: [id number]  HPI: Ronald Berger is a 78 y.o. male presenting on 10/05/2023 for Leg Pain and Knee Pain (Right)   Discussed the use of AI scribe software for clinical note transcription with the patient, who gave verbal consent to proceed.  History of Present Illness   Ronald Berger is a 78 year old male who presents with knee and leg pain.  He has experienced swelling and pain in his right knee, particularly over the last two to three weeks. The pain is located at the back of the knee and radiates down the back of the leg. This is a new symptom for him.  He has a history of knee surgery approximately twenty years ago for a torn meniscus. At that time, he was informed that he might eventually require a knee replacement due to arthritis.  He experiences severe cramps in the affected leg despite taking magnesium gummies, consuming three to four bananas daily, eating mustard, and drinking pickle juice. These measures have not alleviated the cramps.  He works outside and walks a lot. He is unsure of the last time he had an x-ray of his knee.          Relevant past medical, surgical, family and social history reviewed and updated as indicated. Interim medical history since our last visit reviewed. Allergies and medications reviewed and updated.  Review of Systems  Constitutional:  Negative for chills and fever.  Eyes:  Negative for visual disturbance.  Respiratory:  Negative for shortness of breath and wheezing.   Cardiovascular:  Negative for chest pain and leg swelling.  Musculoskeletal:  Positive for arthralgias, joint swelling and myalgias. Negative for back pain and gait problem.  Skin:  Negative for rash.  All other systems reviewed and are negative.   Per HPI unless specifically  indicated above   Allergies as of 10/05/2023       Reactions   Bee Venom Shortness Of Breath   Oxycodone Nausea And Vomiting   Percocet [oxycodone-acetaminophen ] Nausea And Vomiting        Medication List        Accurate as of October 05, 2023  8:55 AM. If you have any questions, ask your nurse or doctor.          albuterol  108 (90 Base) MCG/ACT inhaler Commonly known as: VENTOLIN  HFA INHALE 2 PUFFS INTO THE LUNGS EVERY 6 (SIX) HOURS AS NEEDED FOR WHEEZING OR SHORTNESS OF BREATH.   alfuzosin  10 MG 24 hr tablet Commonly known as: UROXATRAL  Take 1 tablet (10 mg total) by mouth in the morning and at bedtime.   aspirin  81 MG chewable tablet Chew 1 tablet (81 mg total) by mouth daily.   atorvastatin  40 MG tablet Commonly known as: LIPITOR  Take 1 tablet (40 mg total) by mouth daily.   diclofenac  75 MG EC tablet Commonly known as: VOLTAREN  Take 1 tablet (75 mg total) by mouth 2 (two) times daily.   diclofenac  Sodium 1 % Gel Commonly known as: Voltaren  Apply 2 g topically 4 (four) times daily.   EPINEPHrine  0.3 mg/0.3 mL Soaj injection Commonly known as: EPI-PEN Inject 0.3 mg into the muscle as needed for anaphylaxis.   fluticasone   50 MCG/ACT nasal spray Commonly known as: FLONASE  Place 2 sprays into both nostrils daily.   fluticasone -salmeterol 250-50 MCG/ACT Aepb Commonly known as: ADVAIR INHALE 1 PUFF INTO THE LUNGS IN THE MORNING AND AT BEDTIME.   KRILL OIL PO Take by mouth.   levocetirizine 5 MG tablet Commonly known as: XYZAL  TAKE 1 TABLET EVERY EVENING   methocarbamol  500 MG tablet Commonly known as: ROBAXIN  TAKE 1 TABLET EVERY 8 HOURS AS NEEDED FOR MUSCLE SPASM(S)   metoprolol  tartrate 25 MG tablet Commonly known as: LOPRESSOR  TAKE 1/2 TABLET TWICE DAILY   montelukast  10 MG tablet Commonly known as: SINGULAIR  Take 1 tablet (10 mg total) by mouth at bedtime.   NEURIVA PO Take by mouth.   nitroGLYCERIN  0.4 MG SL tablet Commonly known as:  NITROSTAT  DISSOLVE 1 TABLET (0.4 MG TOTAL) UNDER THE TONGUE EVERY 5 (FIVE) MINUTES AS NEEDED FOR CHEST PAIN.   omeprazole  20 MG capsule Commonly known as: PRILOSEC TAKE 1 CAPSULE EVERY EVENING   traZODone  50 MG tablet Commonly known as: DESYREL  Take 1 tablet (50 mg total) by mouth at bedtime.         Objective:   BP 139/80   Pulse (!) 48   Ht 5' 9 (1.753 m)   Wt 213 lb (96.6 kg)   SpO2 98%   BMI 31.45 kg/m   Wt Readings from Last 3 Encounters:  10/05/23 213 lb (96.6 kg)  07/30/23 221 lb 6.4 oz (100.4 kg)  02/09/23 239 lb (108.4 kg)    Physical Exam Vitals and nursing note reviewed.  Musculoskeletal:     Right knee: Effusion and crepitus present. No deformity, erythema or bony tenderness. Normal range of motion. No tenderness. No LCL laxity, MCL laxity, ACL laxity or PCL laxity. Normal alignment, normal meniscus and normal patellar mobility.     Instability Tests: Anterior drawer test negative. Posterior drawer test negative. Anterior Lachman test negative. Medial McMurray test negative and lateral McMurray test negative.    Physical Exam   MUSCULOSKELETAL: Right knee non-tender on palpation. Discomfort on posterior palpation and pain on movement. Pain localized on lateral aspect on specific movement.       Right knee x-ray: Mild trace compartmental narrowing, worse on the medial aspect of both knees  Knee injection: Consent form signed. Risk factors of bleeding and infection discussed with patient and patient is agreeable towards injection. Patient prepped with Betadine . Lateral approach towards injection used. Injected 80 mg of Depo-Medrol  and 1 mL of 2% lidocaine . Patient tolerated procedure well and no side effects from noted. Minimal to no bleeding. Simple bandage applied after.   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 History of meniscal tear surgery. - Order x-ray of the right knee.  Right leg cramps Recurrent right leg cramps unresponsive to dietary interventions. Possible association with knee pain and swelling.          Follow up plan: Return if symptoms worsen or fail to improve.  Counseling  provided for all of the vaccine components Orders Placed This Encounter  Procedures   DG Knee 1-2 Views Right    Fonda Levins, MD [facility] 10/05/2023, 8:55 AM

## 2023-10-12 ENCOUNTER — Other Ambulatory Visit: Payer: Self-pay | Admitting: Family

## 2023-10-12 DIAGNOSIS — M542 Cervicalgia: Secondary | ICD-10-CM

## 2023-10-15 ENCOUNTER — Other Ambulatory Visit: Payer: Self-pay | Admitting: Family

## 2023-10-15 ENCOUNTER — Ambulatory Visit (INDEPENDENT_AMBULATORY_CARE_PROVIDER_SITE_OTHER): Admitting: Family Medicine

## 2023-10-15 ENCOUNTER — Encounter: Payer: Self-pay | Admitting: Family Medicine

## 2023-10-15 ENCOUNTER — Ambulatory Visit: Payer: Self-pay | Admitting: Family Medicine

## 2023-10-15 ENCOUNTER — Ambulatory Visit: Payer: Self-pay

## 2023-10-15 VITALS — BP 98/61 | HR 60 | Temp 97.2°F | Ht 69.0 in | Wt 211.0 lb

## 2023-10-15 DIAGNOSIS — R6883 Chills (without fever): Secondary | ICD-10-CM | POA: Diagnosis not present

## 2023-10-15 DIAGNOSIS — R197 Diarrhea, unspecified: Secondary | ICD-10-CM | POA: Diagnosis not present

## 2023-10-15 DIAGNOSIS — R52 Pain, unspecified: Secondary | ICD-10-CM | POA: Diagnosis not present

## 2023-10-15 DIAGNOSIS — Z91030 Bee allergy status: Secondary | ICD-10-CM

## 2023-10-15 LAB — VERITOR FLU A/B WAIVED
Influenza A: NEGATIVE
Influenza B: NEGATIVE

## 2023-10-15 NOTE — Telephone Encounter (Signed)
 FYI Only or Action Required?: FYI only for provider.  Patient was last seen in primary care on 10/05/2023 by Dettinger, Fonda LABOR, MD.  Called Nurse Triage reporting Diarrhea.  Symptoms began yesterday.  Interventions attempted: Nothing.  Symptoms are: gradually worsening.  Triage Disposition: See Physician Within 24 Hours  Patient/caregiver understands and will follow disposition?: Yes, will follow disposition  Copied from CRM [id number]. Topic: Clinical - Red Word Triage >> Oct 15, 2023  9:35 AM Leonette P wrote: Red Word that prompted transfer to Nurse Triage: congestion in chest, rash, serious diarrhea , chills and sweats Reason for Disposition  [1] MODERATE diarrhea (e.g., 4-6 times / day more than normal) AND [2] age > 70 years  Answer Assessment - Initial Assessment Questions 1. DIARRHEA SEVERITY: How bad is the diarrhea? How many more stools have you had in the past 24 hours than normal?      2 this morning 2. ONSET: When did the diarrhea begin?      yesterday 3. STOOL DESCRIPTION:  How loose or watery is the diarrhea? What is the stool color? Is there any blood or mucous in the stool?     loose 4. VOMITING: Are you also vomiting? If Yes, ask: How many times in the past 24 hours?      denies 5. ABDOMEN PAIN: Are you having any abdomen pain? If Yes, ask: What does it feel like? (e.g., crampy, dull, intermittent, constant)      denies 6. ABDOMEN PAIN SEVERITY: If present, ask: How bad is the pain?  (e.g., Scale 1-10; mild, moderate, or severe)     denies 8. HYDRATION: Any signs of dehydration? (e.g., dry mouth [not just dry lips], too weak to stand, dizziness, new weight loss) When did you last urinate?     States keeping hydrated  9. EXPOSURE: Have you traveled to a foreign country recently? Have you been exposed to anyone with diarrhea? Could you have eaten any food that was spoiled?     denies 10. ANTIBIOTIC USE: Are you taking antibiotics  now or have you taken antibiotics in the past 2 months?       denies 11. OTHER SYMPTOMS: Do you have any other symptoms? (e.g., fever, blood in stool)       Chills, rash on abd, sweats  Protocols used: Diarrhea-A-AH

## 2023-10-15 NOTE — Telephone Encounter (Signed)
 Appt made.

## 2023-10-15 NOTE — Progress Notes (Signed)
 Subjective:  Patient ID: Ronald Berger, male    DOB: May 13, 1945, 78 y.o.   MRN: [id number]  Patient Care Team: Lavell Bari LABOR, FNP as PCP - General (Family Medicine) Mona Vinie BROCKS, MD as PCP - Cardiology (Cardiology) Sherrilee Belvie CROME, MD as Consulting Physician (Urology) Ladora Ross Lacy Phebe, MD as Referring Physician (Optometry) Iva Marty Saltness, MD as Consulting Physician (Allergy and Immunology) Lavell Bari LABOR, FNP as Nurse Practitioner (Family Medicine)   Chief Complaint:  Diarrhea and Chills (Patient states it started last night )   HPI: Ronald Berger is a 78 y.o. male presenting on 10/15/2023 for Diarrhea and Chills (Patient states it started last night )  Ronald Berger is a 78 year old male who presents with generalized body aches, chills, and diarrhea.  He has been experiencing generalized body aches and chills, with alternating sensations of being cold and hot. He started experiencing diarrhea last night, with approximately four episodes since onset. The diarrhea was present until about 8:30 AM today but has since improved. No blood in the stool has been noted.  No fever has been noted, with his temperature consistently around 97-98 degrees Fahrenheit, as checked by his wife. He has not been around anyone sick, nor has he been hospitalized recently.  He has taken some cough medicine but has not used any medications like acetaminophen  or ibuprofen for his symptoms. He received a flu shot and a steroid injection in his knee last Wednesday and Thursday, which are the only recent changes in his medical routine.  No recent antibiotic use or changes in his medications.       Relevant past medical, surgical, family, and social history reviewed and updated as indicated.  Allergies and medications reviewed and updated. Data reviewed: Chart in Epic.   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 NSTEMI 07/2013 - DES to mLCx, DES to prox OM1, DES to mLAD with nonobstructive RCA stenosis, EF 55-65%.   Cataract    Chronic kidney disease    kidney stones    Dyslipidemia    Family history of bone cancer    GERD (gastroesophageal reflux disease)    History of kidney stones    History of non-ST elevation myocardial infarction (NSTEMI)    07/ 2015  S/P  DES X3   History of palpitations    Hyperlipidemia    Hypertension    Insomnia 12/08/2018   x many years   Myocardial infarction (HCC) 2015   NSTEMI- 3 stents    OSA (obstructive sleep apnea)    cpap intolerant   Pneumonia due to COVID-19 virus    Right knee meniscal tear    S/P drug eluting coronary stent placement    07/ 2015  x3  to mLCFX, OM1, mLAD   Sinus bradycardia    Sleep apnea    no cpap     Past Surgical History:  Procedure Laterality Date   CATARACT EXTRACTION, BILATERAL     COLONOSCOPY     >10 yrs ago-polyps per pt- we have no report    EXTRACORPOREAL SHOCK WAVE LITHOTRIPSY  x2 last one 2005   EXTRACORPOREAL SHOCK WAVE LITHOTRIPSY Right 05/28/2021  Procedure: EXTRACORPOREAL SHOCK WAVE LITHOTRIPSY (ESWL);  Surgeon: Roseann Adine PARAS., MD;  Location: [facility];  Service: Urology;  Laterality: Right;   KNEE ARTHROSCOPY Right 05/02/2015   Procedure: RIGHT KNEE ARTHROSCOPY WITH medial meniscal DEBRIDEMENT ;  Surgeon: Dempsey Moan, MD;  Location: [facility];  Service: Orthopedics;  Laterality: Right;   LEFT HEART CATHETERIZATION WITH CORONARY ANGIOGRAM N/A 08/05/2013   Procedure: LEFT HEART CATHETERIZATION WITH CORONARY ANGIOGRAM;  Surgeon: Ozell JONETTA Fell, MD;  Location: [facility] CATH LAB;  Service: Cardiovascular;  Laterality: N/A;  Promus DES's to  mLCFx,  OM1,  mLAD (total 3)/   mRCA 30%,  dLM  20-30%,  preserved  LVSF, ef 55-65%   LITHOTRIPSY     LUMBAR SPINE SURGERY  x3  last one 1980's   SHOULDER ARTHROSCOPY Right 2013   TONSILLECTOMY      Social History   Socioeconomic History   Marital status: Married    Spouse name: Suzen   Number of children: 1   Years of education: 12   Highest education level: Associate degree: occupational, Scientist, product/process development, or vocational program  Occupational History   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  Tobacco Use   Smoking status: Former    Current packs/day: 0.00    Types: Cigarettes    Start date: 04/26/1965    Quit date: 04/27/1966    Years since quitting: 57.5   Smokeless tobacco: Never  Vaping Use   Vaping status: Never Used  Substance and Sexual Activity   Alcohol use: Yes    Comment: occasional   Drug use: No   Sexual activity: Yes  Other Topics Concern   Not on file  Social History Narrative   Son lives with them   One level home   Social Drivers of Health   Financial Resource Strain: Low Risk  (10/04/2023)   Overall Financial Resource Strain (CARDIA)    Difficulty of Paying Living Expenses: Not hard at all  Food Insecurity: No Food Insecurity (10/04/2023)   Hunger Vital Sign    Worried About Running Out of Food in the Last Year: Never true    Ran Out of Food in the Last Year: Never true  Transportation Needs: No Transportation Needs (10/04/2023)   PRAPARE - Administrator, Civil Service (Medical): No    Lack of Transportation (Non-Medical): No  Physical Activity: Sufficiently Active (10/04/2023)   Exercise Vital Sign    Days of Exercise per Week: 5 days    Minutes of Exercise per Session: 150+ min  Stress: No Stress Concern Present (10/04/2023)   [employer] of Occupational Health - Occupational Stress Questionnaire    Feeling of Stress: Not at all  Social Connections: Moderately Isolated (10/04/2023)   Social Connection and Isolation Panel    Frequency of Communication with  Friends and Family: More than three times a week    Frequency of Social Gatherings with Friends and Family: Once a week    Attends Religious Services: Never    Database administrator or Organizations: No    Attends Engineer, structural: Not on file    Marital Status: Married  Catering manager Violence: Not At Risk (04/07/2022)   Humiliation, Afraid, Rape, and Kick questionnaire    Fear of Current or Ex-Partner: No    Emotionally Abused: No    Physically Abused: No    Sexually Abused: No    Outpatient Encounter Medications as of 10/15/2023  Medication  Sig   albuterol  (VENTOLIN  HFA) 108 (90 Base) MCG/ACT inhaler INHALE 2 PUFFS INTO THE LUNGS EVERY 6 (SIX) HOURS AS NEEDED FOR WHEEZING OR SHORTNESS OF BREATH.   alfuzosin  (UROXATRAL ) 10 MG 24 hr tablet Take 1 tablet (10 mg total) by mouth in the morning and at bedtime.   aspirin  81 MG chewable tablet Chew 1 tablet (81 mg total) by mouth daily.   atorvastatin  (LIPITOR ) 40 MG tablet Take 1 tablet (40 mg total) by mouth daily.   diclofenac  (VOLTAREN ) 75 MG EC tablet Take 1 tablet (75 mg total) by mouth 2 (two) times daily.   diclofenac  Sodium (VOLTAREN ) 1 % GEL Apply 2 g topically 4 (four) times daily.   EPINEPHrine  0.3 mg/0.3 mL IJ SOAJ injection Inject 0.3 mg into the muscle as needed for anaphylaxis.   fluticasone  (FLONASE ) 50 MCG/ACT nasal spray Place 2 sprays into both nostrils daily.   fluticasone -salmeterol (ADVAIR) 250-50 MCG/ACT AEPB INHALE 1 PUFF INTO THE LUNGS IN THE MORNING AND AT BEDTIME.   KRILL OIL PO Take by mouth.   levocetirizine (XYZAL ) 5 MG tablet TAKE 1 TABLET EVERY EVENING   methocarbamol  (ROBAXIN ) 500 MG tablet TAKE 1 TABLET EVERY 8 HOURS AS NEEDED FOR MUSCLE SPASM(S)   metoprolol  tartrate (LOPRESSOR ) 25 MG tablet TAKE 1/2 TABLET TWICE DAILY   Misc Natural Products (NEURIVA PO) Take by mouth.   montelukast  (SINGULAIR ) 10 MG tablet Take 1 tablet (10 mg total) by mouth at bedtime.   nitroGLYCERIN  (NITROSTAT ) 0.4 MG  SL tablet DISSOLVE 1 TABLET (0.4 MG TOTAL) UNDER THE TONGUE EVERY 5 (FIVE) MINUTES AS NEEDED FOR CHEST PAIN.   omeprazole  (PRILOSEC) 20 MG capsule TAKE 1 CAPSULE EVERY EVENING   traZODone  (DESYREL ) 50 MG tablet Take 1 tablet (50 mg total) by mouth at bedtime.   No facility-administered encounter medications on file as of 10/15/2023.    Allergies  Allergen Reactions   Bee Venom Shortness Of Breath   Oxycodone Nausea And Vomiting   Percocet [Oxycodone-Acetaminophen ] Nausea And Vomiting    Pertinent ROS per HPI, otherwise unremarkable      Objective:  BP 98/61   Pulse 60   Temp (!) 97.2 F (36.2 C)   Ht 5' 9 (1.753 m)   Wt 211 lb (95.7 kg)   SpO2 93%   BMI 31.16 kg/m    Wt Readings from Last 3 Encounters:  10/15/23 211 lb (95.7 kg)  10/05/23 213 lb (96.6 kg)  07/30/23 221 lb 6.4 oz (100.4 kg)    Physical Exam Vitals and nursing note reviewed.  Constitutional:      General: He is not in acute distress.    Appearance: Normal appearance. He is obese. He is ill-appearing. He is not toxic-appearing or diaphoretic.  HENT:     Head: Normocephalic and atraumatic.     Right Ear: Tympanic membrane, ear canal and external ear normal.     Left Ear: Tympanic membrane, ear canal and external ear normal.     Nose: Nose normal.     Mouth/Throat:     Mouth: Mucous membranes are moist.     Pharynx: Oropharynx is clear. Posterior oropharyngeal erythema present. No oropharyngeal exudate.  Eyes:     Conjunctiva/sclera: Conjunctivae normal.     Pupils: Pupils are equal, round, and reactive to light.  Cardiovascular:     Rate and Rhythm: Normal rate and regular rhythm.     Heart sounds: Normal heart sounds.  Pulmonary:     Effort: Pulmonary effort is normal.  Breath sounds: Normal breath sounds.  Abdominal:     General: Bowel sounds are increased.     Palpations: Abdomen is soft.     Tenderness: There is no abdominal tenderness.  Musculoskeletal:     Cervical back: Neck supple.      Right lower leg: No edema.     Left lower leg: No edema.  Lymphadenopathy:     Cervical: No cervical adenopathy.  Skin:    General: Skin is warm and dry.     Capillary Refill: Capillary refill takes less than 2 seconds.  Neurological:     General: No focal deficit present.     Mental Status: He is alert and oriented to person, place, and time.     Gait: Gait abnormal (antalgic).  Psychiatric:        Mood and Affect: Mood normal.        Thought Content: Thought content normal.        Judgment: Judgment normal.      Results for orders placed or performed in visit on 07/30/23  Bayer DCA Hb A1c Waived   Collection Time: 07/30/23  3:43 PM  Result Value Ref Range   HB A1C (BAYER DCA - WAIVED) 5.7 (H) 4.8 - 5.6 %  CMP14+EGFR   Collection Time: 07/30/23  3:45 PM  Result Value Ref Range   Glucose 83 70 - 99 mg/dL   BUN 20 8 - 27 mg/dL   Creatinine, Ser 8.85 0.76 - 1.27 mg/dL   eGFR 67 >40 fO/fpw/8.26   BUN/Creatinine Ratio 18 10 - 24   Sodium 141 134 - 144 mmol/L   Potassium 4.7 3.5 - 5.2 mmol/L   Chloride 105 96 - 106 mmol/L   CO2 22 20 - 29 mmol/L   Calcium  9.2 8.6 - 10.2 mg/dL   Total Protein 6.5 6.0 - 8.5 g/dL   Albumin 4.0 3.8 - 4.8 g/dL   Globulin, Total 2.5 1.5 - 4.5 g/dL   Bilirubin Total 0.4 0.0 - 1.2 mg/dL   Alkaline Phosphatase 106 44 - 121 IU/L   AST 25 0 - 40 IU/L   ALT 36 0 - 44 IU/L  CBC with Differential/Platelet   Collection Time: 07/30/23  3:45 PM  Result Value Ref Range   WBC 8.7 3.4 - 10.8 x10E3/uL   RBC 5.08 4.14 - 5.80 x10E6/uL   Hemoglobin 14.7 13.0 - 17.7 g/dL   Hematocrit 55.4 62.4 - 51.0 %   MCV 88 79 - 97 fL   MCH 28.9 26.6 - 33.0 pg   MCHC 33.0 31.5 - 35.7 g/dL   RDW 87.9 88.3 - 84.5 %   Platelets 229 150 - 450 x10E3/uL   Neutrophils 51 Not Estab. %   Lymphs 35 Not Estab. %   Monocytes 9 Not Estab. %   Eos 4 Not Estab. %   Basos 1 Not Estab. %   Neutrophils Absolute 4.4 1.4 - 7.0 x10E3/uL   Lymphocytes Absolute 3.0 0.7 - 3.1  x10E3/uL   Monocytes Absolute 0.8 0.1 - 0.9 x10E3/uL   EOS (ABSOLUTE) 0.3 0.0 - 0.4 x10E3/uL   Basophils Absolute 0.1 0.0 - 0.2 x10E3/uL   Immature Granulocytes 0 Not Estab. %   Immature Grans (Abs) 0.0 0.0 - 0.1 x10E3/uL  Lipid panel   Collection Time: 07/30/23  3:45 PM  Result Value Ref Range   Cholesterol, Total 131 100 - 199 mg/dL   Triglycerides 801 (H) 0 - 149 mg/dL   HDL 36 (L) >60 mg/dL  VLDL Cholesterol Cal 33 5 - 40 mg/dL   LDL Chol Calc (NIH) 62 0 - 99 mg/dL   Chol/HDL Ratio 3.6 0.0 - 5.0 ratio  PSA, total and free   Collection Time: 07/30/23  3:45 PM  Result Value Ref Range   Prostate Specific Ag, Serum 6.7 (H) 0.0 - 4.0 ng/mL   PSA, Free 2.02 N/A ng/mL   PSA, Free Pct 30.1 %       Pertinent labs & imaging results that were available during my care of the patient were reviewed by me and considered in my medical decision making.  Assessment & Plan:  Penny was seen today for diarrhea and chills.  Diagnoses and all orders for this visit:  Diarrhea in adult patient -     COVID-19, Flu A+B and RSV -     Veritor Flu A/B Waived  Chills -     COVID-19, Flu A+B and RSV -     Veritor Flu A/B Waived  Generalized body aches -     COVID-19, Flu A+B and RSV -     Veritor Flu A/B Waived      Suspected viral infection (influenza or COVID-19) Symptoms include sore, achy feeling, chills, and diarrhea. No fever reported. Recent flu vaccine and steroid injection in the knee may have contributed to immune suppression. Rapid flu test negative, but high suspicion for viral infection remains. Awaiting results from send-out test for confirmation. Differential includes influenza and COVID-19. - Order send-out test for influenza and COVID-19 - Advise rest and hydration - Recommend acetaminophen  500-650 mg every 4-6 hours as needed for pain and fever - Minimize NSAID intake due to medical history - If send-out test confirms influenza, prescribe oseltamivir  - Instruct to report  any worsening of symptoms  Chills without fever Chills present without accompanying fever. Temperature checks by wife show normal range. Likely associated with suspected viral infection. - Advise rest and hydration - Recommend acetaminophen  500-650 mg every 4-6 hours as needed for chills  Acute diarrhea Onset last night with four episodes reported. No blood in stool. No recent antibiotic use or changes in medication. Likely related to suspected viral infection. - Advise rest and hydration - Monitor symptoms and report any worsening          Continue all other maintenance medications.  Follow up plan: Return if symptoms worsen or fail to improve.   Continue healthy lifestyle choices, including diet (rich in fruits, vegetables, and lean proteins, and low in salt and simple carbohydrates) and exercise (at least 30 minutes of moderate physical activity daily).   The above assessment and management plan was discussed with the patient. The patient verbalized understanding of and has agreed to the management plan. Patient is aware to call the clinic if they develop any new symptoms or if symptoms persist or worsen. Patient is aware when to return to the clinic for a follow-up visit. Patient educated on when it is appropriate to go to the emergency department.   Rosaline Bruns, FNP-C [facility] [phone]

## 2023-10-19 LAB — COVID-19, FLU A+B AND RSV
Influenza A, NAA: NOT DETECTED
Influenza B, NAA: NOT DETECTED
RSV, NAA: NOT DETECTED
SARS-CoV-2, NAA: NOT DETECTED

## 2023-11-04 ENCOUNTER — Other Ambulatory Visit: Payer: Self-pay | Admitting: Family

## 2023-11-04 DIAGNOSIS — R6889 Other general symptoms and signs: Secondary | ICD-10-CM

## 2023-11-04 DIAGNOSIS — J4540 Moderate persistent asthma, uncomplicated: Secondary | ICD-10-CM

## 2023-11-05 ENCOUNTER — Encounter: Payer: Self-pay | Admitting: Family

## 2023-11-05 NOTE — Telephone Encounter (Signed)
 LMTCB to schedule appt Letter mailed

## 2023-11-05 NOTE — Telephone Encounter (Signed)
 [employer]BS in Jan for 6 mos FU RF sent to pharmacy

## 2023-11-10 ENCOUNTER — Ambulatory Visit

## 2023-11-10 VITALS — BP 98/61 | HR 60 | Ht 69.0 in | Wt 211.0 lb

## 2023-11-10 DIAGNOSIS — Z0000 Encounter for general adult medical examination without abnormal findings: Secondary | ICD-10-CM

## 2023-11-10 NOTE — Progress Notes (Addendum)
 Subjective:   Ronald Berger is a 78 y.o. who presents for a Medicare Wellness preventive visit.  As a reminder, Annual Wellness Visits don't include a physical exam, and some assessments may be limited, especially if this visit is performed virtually. We may recommend an in-person follow-up visit with your provider if needed.  Visit Complete: Virtual I connected with  Ronald Berger on 11/10/23 by video and audio enabled telemedicine application and verified that I am speaking with the correct person using two identifiers.  Patient Location: Home  Provider Location: Home Office  I discussed the limitations of evaluation and management by telemedicine. The patient expressed understanding and agreed to proceed.  Vital Signs: Because this visit was a virtual/telehealth visit, some criteria may be missing or patient reported. Any vitals not documented were not able to be obtained and vitals that have been documented are patient reported.  VideoDeclined- This patient declined Librarian, academic. Therefore the visit was completed with audio only.  Persons Participating in Visit: Patient.  AWV Questionnaire: Yes: Patient Medicare AWV questionnaire was completed by the patient on 11/09/23; I have confirmed that all information answered by patient is correct and no changes since this date.        Objective:    Today's Vitals   11/10/23 0822  BP: 98/61  Pulse: 60  Weight: 211 lb (95.7 kg)  Height: 5' 9 (1.753 m)   Body mass index is 31.16 kg/m.     11/10/2023    8:10 AM 04/07/2022   10:41 AM 05/29/2021    1:06 PM 04/03/2021   11:13 AM 04/02/2020   11:03 AM 02/21/2020   10:00 PM 04/01/2019   10:40 AM  Advanced Directives  Does Patient Have a Medical Advance Directive? No Yes No No Yes No Yes  Type of Furniture conservator/restorer;Living will   Living will  Healthcare Power of [place];Living will  Does patient want to make  changes to medical advance directive?     No - Patient declined  No - Patient declined  Copy of Healthcare Power of Attorney in Chart?  No - copy requested     No - copy requested  Would patient like information on creating a medical advance directive?   No - Patient declined Yes (MAU/Ambulatory/Procedural Areas - Information given)  No - Patient declined No - Patient declined    Current Medications (verified) Outpatient Encounter Medications as of 11/10/2023  Medication Sig   albuterol  (VENTOLIN  HFA) 108 (90 Base) MCG/ACT inhaler INHALE 2 PUFFS EVERY 6 HOURS AS NEEDED FOR WHEEZING OR SHORTNESS OF BREATH   alfuzosin  (UROXATRAL ) 10 MG 24 hr tablet Take 1 tablet (10 mg total) by mouth in the morning and at bedtime.   aspirin  81 MG chewable tablet Chew 1 tablet (81 mg total) by mouth daily.   atorvastatin  (LIPITOR ) 40 MG tablet Take 1 tablet (40 mg total) by mouth daily.   diclofenac  (VOLTAREN ) 75 MG EC tablet Take 1 tablet (75 mg total) by mouth 2 (two) times daily.   diclofenac  Sodium (VOLTAREN ) 1 % GEL Apply 2 g topically 4 (four) times daily.   EPINEPHrine  0.3 mg/0.3 mL IJ SOAJ injection INJECT 0.3 MG INTO THE MUSCLE AS NEEDED FOR ANAPHYLAXIS.   fluticasone  (FLONASE ) 50 MCG/ACT nasal spray Place 2 sprays into both nostrils daily.   fluticasone -salmeterol (ADVAIR) 250-50 MCG/ACT AEPB INHALE 1 PUFF INTO THE LUNGS IN THE MORNING AND AT BEDTIME.   KRILL OIL PO  Take by mouth.   levocetirizine (XYZAL ) 5 MG tablet TAKE 1 TABLET EVERY EVENING   methocarbamol  (ROBAXIN ) 500 MG tablet TAKE 1 TABLET EVERY 8 HOURS AS NEEDED FOR MUSCLE SPASM(S)   metoprolol  tartrate (LOPRESSOR ) 25 MG tablet TAKE 1/2 TABLET TWICE DAILY   Misc Natural Products (NEURIVA PO) Take by mouth.   montelukast  (SINGULAIR ) 10 MG tablet Take 1 tablet (10 mg total) by mouth at bedtime.   nitroGLYCERIN  (NITROSTAT ) 0.4 MG SL tablet DISSOLVE 1 TABLET (0.4 MG TOTAL) UNDER THE TONGUE EVERY 5 (FIVE) MINUTES AS NEEDED FOR CHEST PAIN.    omeprazole  (PRILOSEC) 20 MG capsule TAKE 1 CAPSULE EVERY EVENING   traZODone  (DESYREL ) 50 MG tablet Take 1 tablet (50 mg total) by mouth at bedtime.   No facility-administered encounter medications on file as of 11/10/2023.    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 NSTEMI 07/2013 - DES to mLCx, DES to prox OM1, DES to mLAD with nonobstructive RCA stenosis, EF 55-65%.   Cataract    Chronic kidney disease    kidney stones    Dyslipidemia    Family history of bone cancer    GERD (gastroesophageal reflux disease)    History of kidney stones    History of non-ST elevation myocardial infarction (NSTEMI)    07/ 2015  S/P  DES X3   History of palpitations    Hyperlipidemia    Hypertension    Insomnia 12/08/2018   x many years   Myocardial infarction (HCC) 2015   NSTEMI- 3 stents    OSA (obstructive sleep apnea)    cpap intolerant   Pneumonia due to COVID-19 virus    Right knee meniscal tear    S/P drug eluting coronary stent placement    07/ 2015  x3  to mLCFX, OM1, mLAD   Sinus bradycardia    Sleep apnea    no cpap    Past Surgical History:  Procedure Laterality Date   CATARACT EXTRACTION, BILATERAL     COLONOSCOPY     >10 yrs ago-polyps per pt- we have no report    EXTRACORPOREAL SHOCK WAVE LITHOTRIPSY  x2 last one 2005   EXTRACORPOREAL SHOCK WAVE LITHOTRIPSY Right 05/28/2021   Procedure: EXTRACORPOREAL SHOCK WAVE LITHOTRIPSY (ESWL);  Surgeon: Roseann Adine PARAS., MD;  Location: [facility];  Service: Urology;  Laterality: Right;   KNEE ARTHROSCOPY Right 05/02/2015   Procedure: RIGHT KNEE ARTHROSCOPY WITH medial meniscal DEBRIDEMENT ;   Surgeon: Dempsey Moan, MD;  Location[facility];  Service: Orthopedics;  Laterality: Right;   LEFT HEART CATHETERIZATION WITH CORONARY ANGIOGRAM N/A 08/05/2013   Procedure: LEFT HEART CATHETERIZATION WITH CORONARY ANGIOGRAM;  Surgeon: Ozell JONETTA Fell, MD;  Location: [facility];  Service: Cardiovascular;  Laterality: N/A;  Promus DES's to  mLCFx,  OM1,  mLAD (total 3)/   mRCA 30%,  dLM  20-30%,  preserved LVSF, ef 55-65%   LITHOTRIPSY     LUMBAR SPINE SURGERY  x3  last one 1980's   SHOULDER ARTHROSCOPY Right 2013   TONSILLECTOMY     Family History  Problem Relation Age of Onset   Coronary  artery disease Mother        CABG   CAD Sister    Bone cancer Sister    Alzheimer's disease Father    Aneurysm Brother        brain   Heart failure Sister    Bone cancer Sister    Colon cancer Neg Hx    Colon polyps Neg Hx    Esophageal cancer Neg Hx    Rectal cancer Neg Hx    Stomach cancer Neg Hx    Social History   Socioeconomic History   Marital status: Married    Spouse name: Suzen   Number of children: 1   Years of education: 12   Highest education level: Associate degree: occupational, Scientist, product/process development, or vocational program  Occupational History   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  Tobacco Use   Smoking status: Former    Current packs/day: 0.00    Types: Cigarettes    Start date: 04/26/1965    Quit date: 04/27/1966    Years since quitting: 57.5   Smokeless tobacco: Never  Vaping Use   Vaping status: Never Used  Substance and Sexual Activity   Alcohol use: Not Currently    Comment: occasional   Drug use: No   Sexual activity: Yes  Other Topics Concern   Not on file  Social History Narrative   Son lives with them   One level home   Social Drivers of Health   Financial Resource Strain: Low Risk  (11/09/2023)   Overall Financial Resource Strain (CARDIA)    Difficulty of Paying Living Expenses: Not hard at all   Food Insecurity: No Food Insecurity (11/09/2023)   Hunger Vital Sign    Worried About Running Out of Food in the Last Year: Never true    Ran Out of Food in the Last Year: Never true  Transportation Needs: No Transportation Needs (11/09/2023)   PRAPARE - Administrator, Civil Service (Medical): No    Lack of Transportation (Non-Medical): No  Physical Activity: Sufficiently Active (11/09/2023)   Exercise Vital Sign    Days of Exercise per Week: 5 days    Minutes of Exercise per Session: 30 min  Stress: No Stress Concern Present (11/09/2023)   [employer] of Occupational Health - Occupational Stress Questionnaire    Feeling of Stress: Not at all  Social Connections: Moderately Isolated (11/09/2023)   Social Connection and Isolation Panel    Frequency of Communication with Friends and Family: More than three times a week    Frequency of Social Gatherings with Friends and Family: Once a week    Attends Religious Services: Never    Database administrator or Organizations: No    Attends Engineer, structural: Not on file    Marital Status: Married    Tobacco Counseling Counseling given: Yes    Clinical Intake:  Pre-visit preparation completed: Yes  Pain : No/denies pain     BMI - recorded: 31.16 Nutritional Status: BMI > 30  Obese Nutritional Risks: None Diabetes: No  Lab Results  Component Value Date   HGBA1C 5.7 (H) 07/30/2023   HGBA1C 5.5 02/09/2023   HGBA1C 5.7 (H) 07/31/2022     How often do you need to have someone help you when you read instructions, pamphlets, or other written materials from your doctor or pharmacy?: 1 - Never  Interpreter Needed?: No  Information entered by :: alia t/cma   Activities  of Daily Living     11/09/2023    4:49 PM  In your present state of health, do you have any difficulty performing the following activities:  Hearing? 0  Vision? 0  Difficulty concentrating or making decisions? 0  Walking or  climbing stairs? 0  Dressing or bathing? 0  Doing errands, shopping? 0  Preparing Food and eating ? N  Using the Toilet? N  In the past six months, have you accidently leaked urine? N  Do you have problems with loss of bowel control? N  Managing your Medications? N  Managing your Finances? N  Housekeeping or managing your Housekeeping? N    Patient Care Team: Lavell Bari LABOR, FNP as PCP - General (Family Medicine) Mona Vinie BROCKS, MD as PCP - Cardiology (Cardiology) Sherrilee Belvie CROME, MD as Consulting Physician (Urology) Ladora Ross Lacy Phebe, MD as Referring Physician (Optometry) Iva Marty Saltness, MD as Consulting Physician (Allergy and Immunology) Lavell Bari LABOR, FNP as Nurse Practitioner (Family Medicine)  I have updated your Care Teams any recent Medical Services you may have received from other providers in the past year.     Assessment:   This is a routine wellness examination for Ronald Berger.  Hearing/Vision screen Hearing Screening - Comments:: Pt denies hearing dif Vision Screening - Comments:: Pt have readers/pt have cataracts remove next month/pt goes to [facility] in [place],/last over a yr   Goals Addressed             This Visit's Progress    Exercise 3x per week (30 min per time)   On track    Try to exercise for at least 30 minutes, 3 times weekly       Depression Screen     11/10/2023    8:10 AM 10/05/2023    8:20 AM 07/30/2023    3:05 PM 02/09/2023    8:46 AM 10/16/2022    3:08 PM 07/31/2022   11:59 AM 04/07/2022   10:40 AM  PHQ 2/9 Scores  PHQ - 2 Score 0 0 0 0 0 0 0  PHQ- 9 Score   0   1 0    Fall Risk     11/10/2023    8:08 AM 11/09/2023    4:49 PM 10/05/2023    8:19 AM 07/30/2023    3:05 PM 10/16/2022    3:08 PM  Fall Risk   Falls in the past year? 0 1 0 0 0  Number falls in past yr: 0  0 0   Injury with Fall? 0  0    Risk for fall due to : No Fall Risks  Orthopedic patient No Fall Risks   Follow up Falls evaluation completed  Falls  evaluation completed Education provided;Falls evaluation completed     MEDICARE RISK AT HOME:  Medicare Risk at Home Any stairs in or around the home?: (Patient-Rptd) Yes If so, are there any without handrails?: (Patient-Rptd) Yes Home free of loose throw rugs in walkways, pet beds, electrical cords, etc?: (Patient-Rptd) Yes Adequate lighting in your home to reduce risk of falls?: (Patient-Rptd) Yes Life alert?: (Patient-Rptd) No Use of a cane, walker or w/c?: (Patient-Rptd) No Grab bars in the bathroom?: (Patient-Rptd) No Shower chair or bench in shower?: (Patient-Rptd) No Elevated toilet seat or a handicapped toilet?: (Patient-Rptd) No  TIMED UP AND GO:  Was the test performed?  No  Cognitive Function: 6CIT completed        11/10/2023    8:11 AM 04/07/2022  10:42 AM 04/02/2020   11:06 AM 04/01/2019   10:36 AM  6CIT Screen  What Year? 0 points 0 points 0 points 0 points  What month? 0 points 0 points 0 points 0 points  What time? 0 points 0 points 0 points 0 points  Count back from 20 0 points 0 points 0 points 0 points  Months in reverse 4 points 0 points 0 points 0 points  Repeat phrase 0 points 0 points 0 points 0 points  Total Score 4 points 0 points 0 points 0 points    Immunizations Immunization History  Administered Date(s) Administered   Fluad Quad(high Dose 65+) 12/03/2018, 10/26/2019, 12/04/2020, 10/22/2021   Fluad Trivalent(High Dose 65+) 10/16/2022   INFLUENZA, HIGH DOSE SEASONAL PF 10/05/2023   Influenza Inj Mdck Quad Pf 12/03/2018   Influenza,inj,Quad PF,6+ Mos 11/10/2016, 12/01/2017   Influenza-Unspecified 11/27/2015   Moderna Covid-19 Vaccine Bivalent Booster 58yrs & up 12/04/2020   Moderna Sars-Covid-2 Vaccination 03/28/2019, 04/25/2019, 05/01/2020   Pneumococcal Conjugate-13 02/23/2015   Pneumococcal Polysaccharide-23 08/06/2013   Tdap 06/04/2019   Zoster Recombinant(Shingrix ) 04/16/2021, 06/12/2021    Screening Tests Health Maintenance  Topic  Date Due   Medicare Annual Wellness (AWV)  04/07/2023   COVID-19 Vaccine (5 - 2025-26 season) 09/21/2023   Colonoscopy  05/22/2027   DTaP/Tdap/Td (2 - Td or Tdap) 06/03/2029   Pneumococcal Vaccine: 50+ Years  Completed   Influenza Vaccine  Completed   Hepatitis C Screening  Completed   Zoster Vaccines- Shingrix   Completed   Meningococcal B Vaccine  Aged Out    Health Maintenance Items Addressed: See Nurse Notes at the end of this note  Additional Screening:  Vision Screening: Recommended annual ophthalmology exams for early detection of glaucoma and other disorders of the eye. Is the patient up to date with their annual eye exam?  Yes  Who is the provider or what is the name of the office in which the patient attends annual eye exams? [facility] in [place], [place]  Dental Screening: Recommended annual dental exams for proper oral hygiene  Community Resource Referral / Chronic Care Management: CRR required this visit?  No   CCM required this visit?  No   Plan:    I have personally reviewed and noted the following in the patient's chart:   Medical and social history Use of alcohol, tobacco or illicit drugs  Current medications and supplements including opioid prescriptions. Patient is not currently taking opioid prescriptions. Functional ability and status Nutritional status Physical activity Advanced directives List of other physicians Hospitalizations, surgeries, and ER visits in previous 12 months Vitals Screenings to include cognitive, depression, and falls Referrals and appointments  In addition, I have reviewed and discussed with patient certain preventive protocols, quality metrics, and best practice recommendations. A written personalized care plan for preventive services as well as general preventive health recommendations were provided to patient.   Ronald Berger, CMA   11/10/2023   After Visit Summary: (MyChart) Due to this being a telephonic visit, the after  visit summary with patients personalized plan was offered to patient via MyChart   Notes: Nothing significant to report at this time.

## 2023-11-10 NOTE — Patient Instructions (Signed)
 [name],  Thank you for taking the time for your Medicare Wellness Visit. I appreciate your continued commitment to your health goals. Please review the care plan we discussed, and feel free to reach out if I can assist you further.  Medicare recommends these wellness visits once per year to help you and your care team stay ahead of potential health issues. These visits are designed to focus on prevention, allowing your provider to concentrate on managing your acute and chronic conditions during your regular appointments.  Please note that Annual Wellness Visits do not include a physical exam. Some assessments may be limited, especially if the visit was conducted virtually. If needed, we may recommend a separate in-person follow-up with your provider.  Ongoing Care Seeing your primary care provider every 3 to 6 months helps us  monitor your health and provide consistent, personalized care.   Referrals If a referral was made during today's visit and you haven't received any updates within two weeks, please contact the referred provider directly to check on the status.  Recommended Screenings:  Health Maintenance  Topic Date Due   Medicare Annual Wellness Visit  04/07/2023   COVID-19 Vaccine (5 - 2025-26 season) 09/21/2023   Colon Cancer Screening  05/22/2027   DTaP/Tdap/Td vaccine (2 - Td or Tdap) 06/03/2029   Pneumococcal Vaccine for age over 43  Completed   Flu Shot  Completed   Hepatitis C Screening  Completed   Zoster (Shingles) Vaccine  Completed   Meningitis B Vaccine  Aged Out       11/10/2023    8:10 AM  Advanced Directives  Does Patient Have a Medical Advance Directive? No   Advance Care Planning is important because it: Ensures you receive medical care that aligns with your values, goals, and preferences. Provides guidance to your family and loved ones, reducing the emotional burden of decision-making during critical moments.  Vision: Annual vision screenings are  recommended for early detection of glaucoma, cataracts, and diabetic retinopathy. These exams can also reveal signs of chronic conditions such as diabetes and high blood pressure.  Dental: Annual dental screenings help detect early signs of oral cancer, gum disease, and other conditions linked to overall health, including heart disease and diabetes.  Please see the attached documents for additional preventive care recommendations.

## 2023-11-19 ENCOUNTER — Ambulatory Visit: Admitting: Family Medicine

## 2023-11-19 ENCOUNTER — Encounter: Payer: Self-pay | Admitting: Family Medicine

## 2023-11-19 VITALS — BP 115/65 | HR 57 | Temp 97.6°F | Ht 69.0 in | Wt 212.0 lb

## 2023-11-19 DIAGNOSIS — J0100 Acute maxillary sinusitis, unspecified: Secondary | ICD-10-CM

## 2023-11-19 MED ORDER — BETAMETHASONE SOD PHOS & ACET 6 (3-3) MG/ML IJ SUSP
6.0000 mg | Freq: Once | INTRAMUSCULAR | Status: AC
  Administered 2023-11-19: 6 mg via INTRAMUSCULAR

## 2023-11-19 MED ORDER — AMOXICILLIN-POT CLAVULANATE 875-125 MG PO TABS: 1.0000 | ORAL_TABLET | Freq: Two times a day (BID) | ORAL | 0 refills | Status: DC

## 2023-11-19 MED ORDER — PSEUDOEPHEDRINE-GUAIFENESIN ER 60-600 MG PO TB12: 1.0000 | ORAL_TABLET | Freq: Two times a day (BID) | ORAL | 0 refills | Status: AC

## 2023-11-19 NOTE — Progress Notes (Addendum)
 Chief Complaint  Patient presents with   Sinusitis    Reoccuring sinus infection. Started two days ago. Coughing, sinus drainage and pressure, and headache. No fever. Flu and covid test negative.     HPI  Patient presents today for Discussed the use of AI scribe software for clinical note transcription with the patient, who gave verbal consent to proceed.  History of Present Illness Ronald Berger is a 78 year old male who presents with sinus problems.  He experiences sinus issues annually during the late fall, characterized by green nasal drainage, coughing, and a sore throat, which he attributes to allergies. The symptoms began approximately two days ago.  He has mild headaches and earaches, but they are not severe. He coughs up green sputum and frequently blows his nose to clear the drainage. He has experienced some shortness of breath. No fever.     PMH: Smoking status noted Review of Systems  Objective: BP 115/65   Pulse (!) 57   Temp 97.6 F (36.4 C)   Ht 5' 9 (1.753 m)   Wt 212 lb (96.2 kg)   SpO2 95%   BMI 31.31 kg/m  Gen: NAD, alert, cooperative with exam HEENT: NCAT, EOMI, PERRL. Tender for maxillary percussion, bilaterally. Nasal passages swollen, inflamed CV: RRR, good S1/S2, no murmur Resp: CTABL, no wheezes, non-labored Ext: No edema, warm Neuro: Alert and oriented, No gross deficits . Nasal passages Acute maxillary sinusitis, recurrence not specified -     Betamethasone  Sod Phos & Acet  Other orders -     Pseudoephedrine-guaiFENesin  ER; Take 1 tablet by mouth every 12 (twelve) hours for 10 days. As needed for congestion  Dispense: 20 tablet; Refill: 0 -     Amoxicillin -Pot Clavulanate; Take 1 tablet by mouth 2 (two) times daily. Take all of this medication  Dispense: 20 tablet; Refill: 0    Assessment and Plan Assessment & Plan Acute sinusitis   He presents with acute sinusitis characterized by green nasal drainage, cough, sore throat, and mild  headache for two days. There is no fever, but he experiences mild shortness of breath and cheek tenderness, likely due to seasonal allergies. Administer 1 mL IM Celestone . Prescribe Augmentin . Recommend an age-appropriate decongestant and communicate with the pharmacist at [facility].      Butler Der, MD

## 2023-11-19 NOTE — Addendum Note (Signed)
 Addended by: ZOLLIE LOWERS on: 11/19/2023 10:59 AM   Modules accepted: Orders

## 2023-11-25 ENCOUNTER — Other Ambulatory Visit: Payer: Self-pay | Admitting: Family

## 2023-11-25 DIAGNOSIS — J301 Allergic rhinitis due to pollen: Secondary | ICD-10-CM

## 2023-11-25 DIAGNOSIS — J4530 Mild persistent asthma, uncomplicated: Secondary | ICD-10-CM

## 2023-11-30 ENCOUNTER — Telehealth: Payer: Self-pay | Admitting: Urology

## 2023-11-30 NOTE — Telephone Encounter (Signed)
 Patient wants appt. He has kidney stones and is having left pain and thinks it is moving.

## 2023-11-30 NOTE — Telephone Encounter (Signed)
 Return call to patient. Patient is aware KUB order are in and he can go get KUB and we will routed it to MD, McKenzie for review. Pt voiced understanding

## 2023-12-01 ENCOUNTER — Ambulatory Visit (HOSPITAL_COMMUNITY)
Admission: RE | Admit: 2023-12-01 | Discharge: 2023-12-01 | Disposition: A | Discharge status: Home / Self Care | Source: Ambulatory Visit | Attending: Urology | Admitting: Urology

## 2023-12-01 DIAGNOSIS — N200 Calculus of kidney: Secondary | ICD-10-CM | POA: Diagnosis not present

## 2023-12-01 NOTE — Progress Notes (Signed)
 [facility] - Clinic Note  12/15/2023   CHIEF COMPLAINT Patient presents for Retina Follow Up  HISTORY OF PRESENT ILLNESS: Ronald Berger is a 78 y.o. male who presents to the clinic today for:  HPI     Retina Follow Up   Patient presents with  Other.  In both eyes.  This started 6 months ago.  I, the attending physician,  performed the HPI with the patient and updated documentation appropriately.        Comments   Patient here for 6 months retina follow up for visual disturbance. Patient states vision a lot better since had cataract surgery OS. Had it last week. No eye pain. On occasion sees little white flash. Using the combo drop QID OS.       Last edited by Valdemar Rogue, MD on 12/15/2023  4:57 PM.    Pt states he had cataract sx last week w/ Dr. Fleeta. Seeing FOL in OS--seems to go away when he puts his sunglasses on.   Referring physician: Lavell Bari LABOR, FNP [address] [place],  [place] [address]  HISTORICAL INFORMATION:  Selected notes from the MEDICAL RECORD NUMBER [facility] pt here for full loss of VF LEE:  Ocular Hx- PMH-   CURRENT MEDICATIONS: No current outpatient medications on file. (Ophthalmic Drugs)   No current facility-administered medications for this visit. (Ophthalmic Drugs)   Current Outpatient Medications (Other)  Medication Sig   albuterol  (VENTOLIN  HFA) 108 (90 Base) MCG/ACT inhaler INHALE 2 PUFFS EVERY 6 HOURS AS NEEDED FOR WHEEZING OR SHORTNESS OF BREATH   alfuzosin  (UROXATRAL ) 10 MG 24 hr tablet Take 1 tablet (10 mg total) by mouth in the morning and at bedtime.   amoxicillin -clavulanate (AUGMENTIN ) 875-125 MG tablet Take 1 tablet by mouth 2 (two) times daily. Take all of this medication   aspirin  81 MG chewable tablet Chew 1 tablet (81 mg total) by mouth daily.   atorvastatin  (LIPITOR ) 40 MG tablet Take 1 tablet (40 mg total) by mouth daily.   diclofenac  (VOLTAREN ) 75 MG EC tablet Take 1 tablet (75 mg total) by mouth  2 (two) times daily.   diclofenac  Sodium (VOLTAREN ) 1 % GEL Apply 2 g topically 4 (four) times daily.   EPINEPHrine  0.3 mg/0.3 mL IJ SOAJ injection INJECT 0.3 MG INTO THE MUSCLE AS NEEDED FOR ANAPHYLAXIS.   fluticasone  (FLONASE ) 50 MCG/ACT nasal spray Place 2 sprays into both nostrils daily.   fluticasone -salmeterol (ADVAIR) 250-50 MCG/ACT AEPB INHALE 1 PUFF INTO THE LUNGS IN THE MORNING AND AT BEDTIME.   KRILL OIL PO Take by mouth.   levocetirizine (XYZAL ) 5 MG tablet TAKE 1 TABLET EVERY EVENING   methocarbamol  (ROBAXIN ) 500 MG tablet TAKE 1 TABLET EVERY 8 HOURS AS NEEDED FOR MUSCLE SPASM(S)   metoprolol  tartrate (LOPRESSOR ) 25 MG tablet TAKE 1/2 TABLET TWICE DAILY   Misc Natural Products (NEURIVA PO) Take by mouth.   montelukast  (SINGULAIR ) 10 MG tablet TAKE 1 TABLET AT BEDTIME   nitroGLYCERIN  (NITROSTAT ) 0.4 MG SL tablet DISSOLVE 1 TABLET (0.4 MG TOTAL) UNDER THE TONGUE EVERY 5 (FIVE) MINUTES AS NEEDED FOR CHEST PAIN.   omeprazole  (PRILOSEC) 20 MG capsule TAKE 1 CAPSULE EVERY EVENING   traZODone  (DESYREL ) 50 MG tablet Take 1 tablet (50 mg total) by mouth at bedtime.   vancomycin  (VANCOCIN ) 125 MG capsule Take 1 capsule (125 mg total) by mouth 4 (four) times daily for 10 days.   No current facility-administered medications for this visit. (Other)  REVIEW OF SYSTEMS: ROS   Positive for: Cardiovascular, Allergic/Imm Last edited by Orval Asberry RAMAN, COA on 12/15/2023  2:33 PM.      ALLERGIES Allergies  Allergen Reactions   Bee Venom Shortness Of Breath   Oxycodone Nausea And Vomiting   Percocet [Oxycodone-Acetaminophen ] Nausea And Vomiting   PAST MEDICAL HISTORY Past Medical History:  Diagnosis Date   Allergic rhinitis    Allergy    Asthma due to environmental allergies    a.  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 NSTEMI 07/2013 - DES to  mLCx, DES to prox OM1, DES to mLAD with nonobstructive RCA stenosis, EF 55-65%.   Cataract    Chronic kidney disease    kidney stones    Dyslipidemia    Family history of bone cancer    GERD (gastroesophageal reflux disease)    History of kidney stones    History of non-ST elevation myocardial infarction (NSTEMI)    07/ 2015  S/P  DES X3   History of palpitations    Hyperlipidemia    Hypertension    Insomnia 12/08/2018   x many years   Myocardial infarction (HCC) 2015   NSTEMI- 3 stents    OSA (obstructive sleep apnea)    cpap intolerant   Pneumonia due to COVID-19 virus    Right knee meniscal tear    S/P drug eluting coronary stent placement    07/ 2015  x3  to mLCFX, OM1, mLAD   Sinus bradycardia    Sleep apnea    no cpap    Past Surgical History:  Procedure Laterality Date   CATARACT EXTRACTION, BILATERAL     COLONOSCOPY     >10 yrs ago-polyps per pt- we have no report    EXTRACORPOREAL SHOCK WAVE LITHOTRIPSY  x2 last one 2005   EXTRACORPOREAL SHOCK WAVE LITHOTRIPSY Right 05/28/2021   Procedure: EXTRACORPOREAL SHOCK WAVE LITHOTRIPSY (ESWL);  Surgeon: Roseann Adine PARAS., MD;  Location: [facility];  Service: Urology;  Laterality: Right;   KNEE ARTHROSCOPY Right 05/02/2015   Procedure: RIGHT KNEE ARTHROSCOPY WITH medial meniscal DEBRIDEMENT ;  Surgeon: Dempsey Moan, MD;  Location: [facility];  Service: Orthopedics;  Laterality: Right;   LEFT HEART CATHETERIZATION WITH CORONARY ANGIOGRAM N/A 08/05/2013   Procedure: LEFT HEART CATHETERIZATION WITH CORONARY ANGIOGRAM;  Surgeon: Ozell JONETTA Fell, MD;  Location: [facility] CATH LAB;  Service: Cardiovascular;  Laterality: N/A;  Promus DES's to  mLCFx,  OM1,  mLAD (total 3)/   mRCA 30%,  dLM  20-30%,  preserved LVSF, ef 55-65%   LITHOTRIPSY     LUMBAR SPINE SURGERY  x3  last one 1980's   SHOULDER ARTHROSCOPY Right 2013   TONSILLECTOMY     FAMILY HISTORY Family History  Problem Relation Age of Onset   Coronary artery  disease Mother        CABG   CAD Sister    Bone cancer Sister    Alzheimer's disease Father    Aneurysm Brother        brain   Heart failure Sister    Bone cancer Sister    Colon cancer Neg Hx    Colon polyps Neg Hx    Esophageal cancer Neg Hx    Rectal cancer Neg Hx    Stomach cancer Neg Hx    SOCIAL HISTORY Social History  Tobacco Use   Smoking status: Former    Current packs/day: 0.00    Types: Cigarettes    Start date: 04/26/1965    Quit date: 04/27/1966    Years since quitting: 57.6   Smokeless tobacco: Never  Vaping Use   Vaping status: Never Used  Substance Use Topics   Alcohol use: Not Currently    Comment: occasional   Drug use: No       OPHTHALMIC EXAM:  Base Eye Exam     Visual Acuity (Snellen - Linear)       Right Left   Dist [place] 20/25 20/30 +1   Dist ph [place]  20/25 -2         Tonometry (Tonopen, 2:29 PM)       Right Left   Pressure 16 22         Pupils       Dark Light Shape React APD   Right 3 2 Round Brisk None   Left 3 2 Round Brisk None         Visual Fields (Counting fingers)       Left Right    Full Full         Extraocular Movement       Right Left    Full, Ortho Full, Ortho         Neuro/Psych     Oriented x3: Yes   Mood/Affect: Normal         Dilation     Both eyes: 1.0% Mydriacyl, 2.5% Phenylephrine @ 2:29 PM           Slit Lamp and Fundus Exam     External Exam       Right Left   External Normal Normal         Slit Lamp Exam       Right Left   Lids/Lashes Dermatochalasis - upper lid Dermatochalasis - upper lid   Conjunctiva/Sclera nasal pingeucula White and quiet   Cornea Arcus, trace tear film debris Arcus, tear film debris, Well healed temporal cataract wound   Anterior Chamber Deep and clear 2+ fine cell/pigment   Iris Round and dilated Round and moderately dilated   Lens PC IOL in good position, trace inferior PCO PC IOL in good position   Anterior Vitreous mild syneresis  Vitreous syneresis         Fundus Exam       Right Left   Disc Pink and sharp, +fibrosis Pink and sharp   C/D Ratio 0.1 0.2   Macula Blunted foveal reflex, ERM with preretinal fibrosis inferior mac, punctate central drusen, RPE mottling Flat, Blunted foveal reflex, mild ERM, no heme or edema   Vessels attenuated, Tortuous, mild copper wiring, fibrosis along IT arcades, milld AV crossing changes attenuated, Tortuous   Periphery Attached, No heme Attached, No heme           IMAGING AND PROCEDURES  Imaging and Procedures for 12/15/2023  OCT, Retina - OU - Both Eyes       Right Eye Quality was good. Central Foveal Thickness: 498. Progression has been stable. Findings include no IRF, no SRF, abnormal foveal contour, epiretinal membrane, macular pucker, preretinal fibrosis (ERM with pucker and thickening (cystic changes / schisis) -- stable).   Left Eye Quality was good. Central Foveal Thickness: 335. Progression has been stable. Findings include no IRF, no SRF, abnormal foveal contour, epiretinal membrane, macular pucker, vitreomacular adhesion (Mild ERM with blunting of foveal contour and  mild pucker).   Notes *Images captured and stored on drive  Diagnosis / Impression:  OD: ERM with pucker and thickening (cystic changes / schisis) -- stable OS: Mild ERM with blunting of foveal contour and mild pucker  Clinical management:  See below  Abbreviations: NFP - Normal foveal profile. CME - cystoid macular edema. PED - pigment epithelial detachment. IRF - intraretinal fluid. SRF - subretinal fluid. EZ - ellipsoid zone. ERM - epiretinal membrane. ORA - outer retinal atrophy. ORT - outer retinal tubulation. SRHM - subretinal hyper-reflective material. IRHM - intraretinal hyper-reflective material           ASSESSMENT/PLAN:   ICD-10-CM   1. Epiretinal membrane (ERM), bilateral  H35.373 OCT, Retina - OU - Both Eyes    2. Essential hypertension  I10     3. Hypertensive  retinopathy of both eyes  H35.033     4. Pseudophakia of both eyes  Z96.1      1. Epiretinal membrane, both eyes (OD>OS) - OCT shows OD: ERM with pucker and thickening (cystic changes / schisis) -- stable; OS: Mild ERM with blunting of foveal contour and mild pucker - BCVA OD 20/25-stable, OS 20/25--improved from 20/40 s/p CEIOL OS - pt reports minimal metamorphopsia - no indication for surgery at this time - monitor for now - f/u in 9 mos  2,3. Hypertensive retinopathy OU - discussed importance of tight BP control - monitor   4. Pseudophakia OU  - s/p CE/IOL OU (OD w/ Dr. Cleatus; OS w/ Dr. Fleeta, Nov 2025)  - IOL in good position, doing well  - post op drops OS per Dr. Fleeta  - monitor    Ophthalmic Meds Ordered this visit:  No orders of the defined types were placed in this encounter.    Return in about 9 months (around 09/13/2024) for ERM OU, DFE, OCT.  There are no Patient Instructions on file for this visit.  Explained the diagnoses, plan, and follow up with the patient and they expressed understanding.  Patient expressed understanding of the importance of proper follow up care.   This document serves as a record of services personally performed by Redell JUDITHANN Hans, MD, PhD. It was created on their behalf by Wanda GEANNIE Keens, COT an ophthalmic technician. The creation of this record is the provider's dictation and/or activities during the visit.    Electronically signed by:  Wanda GEANNIE Keens, COT  12/15/23 4:59 PM  This document serves as a record of services personally performed by Redell JUDITHANN Hans, MD, PhD. It was created on their behalf by Almetta Pesa, an ophthalmic technician. The creation of this record is the provider's dictation and/or activities during the visit.    Electronically signed by: Almetta Pesa, OA, 12/15/23  4:59 PM  Redell JUDITHANN Hans, M.D., Ph.D. Diseases & Surgery of the Retina and Vitreous [facility] 12/15/2023  I  have reviewed the above documentation for accuracy and completeness, and I agree with the above. Redell JUDITHANN Hans, M.D., Ph.D. 12/15/23 5:01 PM   Abbreviations: M myopia (nearsighted); A astigmatism; H hyperopia (farsighted); P presbyopia; Mrx spectacle prescription;  CTL contact lenses; OD right eye; OS left eye; OU both eyes  XT exotropia; ET esotropia; PEK punctate epithelial keratitis; PEE punctate epithelial erosions; DES dry eye syndrome; MGD meibomian gland dysfunction; ATs artificial tears; PFAT's preservative free artificial tears; NSC nuclear sclerotic cataract; PSC posterior subcapsular cataract; ERM epi-retinal membrane; PVD posterior vitreous detachment; RD retinal detachment; DM diabetes mellitus;  DR diabetic retinopathy; NPDR non-proliferative diabetic retinopathy; PDR proliferative diabetic retinopathy; CSME clinically significant macular edema; DME diabetic macular edema; dbh dot blot hemorrhages; CWS cotton wool spot; POAG primary open angle glaucoma; C/D cup-to-disc ratio; HVF humphrey visual field; GVF goldmann visual field; OCT optical coherence tomography; IOP intraocular pressure; BRVO Branch retinal vein occlusion; CRVO central retinal vein occlusion; CRAO central retinal artery occlusion; BRAO branch retinal artery occlusion; RT retinal tear; SB scleral buckle; PPV pars plana vitrectomy; VH Vitreous hemorrhage; PRP panretinal laser photocoagulation; IVK intravitreal kenalog; VMT vitreomacular traction; MH Macular hole;  NVD neovascularization of the disc; NVE neovascularization elsewhere; AREDS age related eye disease study; ARMD age related macular degeneration; POAG primary open angle glaucoma; EBMD epithelial/anterior basement membrane dystrophy; ACIOL anterior chamber intraocular lens; IOL intraocular lens; PCIOL posterior chamber intraocular lens; Phaco/IOL phacoemulsification with intraocular lens placement; PRK photorefractive keratectomy; LASIK laser assisted in situ  keratomileusis; HTN hypertension; DM diabetes mellitus; COPD chronic obstructive pulmonary disease

## 2023-12-03 ENCOUNTER — Ambulatory Visit: Admitting: Family

## 2023-12-03 ENCOUNTER — Encounter: Payer: Self-pay | Admitting: Family

## 2023-12-03 VITALS — BP 133/77 | HR 59 | Temp 97.1°F | Ht 69.0 in | Wt 214.2 lb

## 2023-12-03 DIAGNOSIS — R197 Diarrhea, unspecified: Secondary | ICD-10-CM

## 2023-12-03 DIAGNOSIS — W57XXXA Bitten or stung by nonvenomous insect and other nonvenomous arthropods, initial encounter: Secondary | ICD-10-CM

## 2023-12-03 DIAGNOSIS — S30861A Insect bite (nonvenomous) of abdominal wall, initial encounter: Secondary | ICD-10-CM

## 2023-12-03 NOTE — Patient Instructions (Signed)
C. Diff Infection C. diff infection, or C. diff, is caused by germs (bacteria) called Clostridioides difficile. These germs cause diarrhea and very bad inflammation in your colon. This infection often happens after taking antibiotics. C. diff can spread easily to others, usually from touching or swallowing something that has the germ on it. What are the causes? Common causes of C. diff include: Taking antibiotics. Coming in contact with people, food, or things that have the germ on it. What increases the risk? You may be more likely to get C. diff if you: Take antibiotics that kill many types of germs or take antibiotics for a long time. Stay in a hospital or nursing home for a long time. Are age 87 or older. Have had it before or have been in contact with C. diff germs. Have a weak disease-fighting, or immune, system. Have a serious health condition, such as colon cancer or inflammatory bowel disease (IBD). Take medicines that treat stomach acid. Have had a surgery on your digestive system. What are the signs or symptoms? Diarrhea at least 3 times a day for many days. Fever. Nausea. Not feeling hungry. Swelling, pain, cramping, or tenderness in the belly, or abdomen. How is this diagnosed? C. diff is diagnosed with: Your medical history and a physical exam. Tests such as: A test that checks your poop (stool) for C. diff. Blood tests. Imaging tests of your intestines, such as a CT scan or X-ray. A procedure to look at your colon. This is rare. How is this treated? Treatment for C. diff may include: Stopping the antibiotics that caused C. diff. Taking antibiotics that kill C. diff. Placing poop from a healthy person into your colon. This is called a fecal transplant. It may be done if C. diff keeps coming back. Having surgery to take out the infected part of the colon. This is rare. Follow these instructions at home: Medicines Take over-the-counter and prescription medicines only  as told by your health care provider. Take your antibiotics as told by your provider. Do not stop taking your antibiotics even if you start to feel better. Do not take medicines that treat diarrhea unless your provider tells you to. Eating and drinking Follow instructions from your provider about what you may eat or drink. Eat bland foods in small amounts that are easy to digest. These include bananas, applesauce, rice, and toast. Avoid milk, caffeine, and alcohol. Replace any body fluid that has been lost. You can do this by: Drinking clear fluids to keep your pee (urine) pale yellow. This includes water, clear fruit juice with water added to it, and low-calorie sports drinks. Sucking on ice chips. Taking an oral rehydration solution (ORS). This drink is sold at pharmacies and retail stores. General instructions Wash your hands often with soap and water for at least 20 seconds. Take a bath or shower every day. Return to your normal activities as told by your provider. Ask your provider what activities are safe for you. Be sure your home is clean before you leave the hospital or clinic. Clean every day for at least a week. Keep all follow-up visits to make sure the infection is gone. How is this prevented? Wash [employer] your hands with soap and water for at least 20 seconds. Wash your hands before cooking and after using the bathroom. Other people should wash their hands too, especially: People who live with you. People who visit you. Stop germs from spreading Tell your provider right away if you get  diarrhea while in a hospital or nursing home. When you visit someone in a hospital or nursing home, wear a gown, gloves, or other protection. Try to stay away from people who have diarrhea. Try to use a different bathroom if you're sick and live with other people. Clean surfaces Clean surfaces that you touch every day. Use a product that has a 10% chlorine bleach solution. Be sure  to: Read the product label to make sure it will kill germs. Clean toilets and flush handles, bathtubs, sinks, doorknobs and handles, countertops, and work surfaces. If you're in the hospital, make sure the surfaces in your room are cleaned each day. Tell someone right away if body fluids have splashed or spilled. Washing clothes and linens Wash clothes and linens using laundry soap that has chlorine bleach. Be sure to: Use powder soap instead of liquid. Clean your washing machine once a month. To do this, turn on the hot setting with only soap in it. Contact a health care provider if: Your symptoms do not get better or get worse. Your symptoms go away and then come back. You have a fever. You have new symptoms. Get help right away if: You have more pain or tenderness in your belly. You have poop that's bloody or looks black and tarry. You vomit every time you eat or drink. You have signs of not having enough fluids in your body. These include: Dark yellow pee, very little pee, or no pee. Cracked lips or dry mouth. No tears when you cry. Sunken eyes. Feeling tired. Feeling weak or dizzy. This information is not intended to replace advice given to you by your health care provider. Make sure you discuss any questions you have with your health care provider. Document Revised: 03/31/2022 Document Reviewed: 03/31/2022 Elsevier Patient Education  2024 [employer].

## 2023-12-03 NOTE — Progress Notes (Signed)
 Subjective:    Patient ID: Ronald Berger, male    DOB: 09-22-1945, 78 y.o.   MRN: [id number]  Chief Complaint  Patient presents with   Diarrhea    Several times ago 6 times already today.   Tick Removal   PT presents to the office today with diarrhea that started a month ago that is unchanged.   He reports he has had several tick bites over the last year. His last one on his left abdomen. Denies any fever, rash, headache, or new joint pain.  Diarrhea  This is a new problem. The current episode started more than 1 month ago. The problem occurs more than 10 times per day. The problem has been unchanged. The stool consistency is described as Watery. Associated symptoms include increased flatus. Pertinent negatives include no bloating, chills, coughing, fever, URI or vomiting. Nothing aggravates the symptoms. Risk factors include recent antibiotic use. He has tried nothing for the symptoms. The treatment provided no relief.      Review of Systems  Constitutional:  Negative for chills and fever.  Respiratory:  Negative for cough.   Gastrointestinal:  Positive for diarrhea and flatus. Negative for bloating and vomiting.  All other systems reviewed and are negative.   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  Tobacco Use   Smoking status: Former    Current packs/day: 0.00    Types: Cigarettes    Start date: 04/26/1965    Quit date: 04/27/1966    Years since quitting: 57.6   Smokeless tobacco: Never  Vaping Use   Vaping status: Never Used  Substance and Sexual Activity   Alcohol use: Not Currently    Comment: occasional   Drug use: No   Sexual activity: Yes  Other Topics Concern    Not on file  Social History Narrative   Son lives with them   One level home   Social Drivers of Health   Financial Resource Strain: Low Risk  (11/09/2023)   Overall Financial Resource Strain (CARDIA)    Difficulty of Paying Living Expenses: Not hard at all  Food Insecurity: No Food Insecurity (11/09/2023)   Hunger Vital Sign    Worried About Running Out of Food in the Last Year: Never true    Ran Out of Food in the Last Year: Never true  Transportation Needs: No Transportation Needs (11/09/2023)   PRAPARE - Administrator, Civil Service (Medical): No    Lack of Transportation (Non-Medical): No  Physical Activity: Sufficiently Active (11/09/2023)   Exercise Vital Sign    Days of Exercise per Week: 5 days    Minutes of Exercise per Session: 30 min  Stress: No Stress Concern Present (11/09/2023)   [employer] of Occupational Health - Occupational Stress Questionnaire    Feeling of Stress: Not at all  Social Connections: Moderately Isolated (11/09/2023)   Social Connection and Isolation Panel    Frequency of Communication with Friends and Family: More than three times a week    Frequency of Social Gatherings with Friends and Family: Once a week    Attends Religious Services: Never    Database Administrator or Organizations: No  Attends Engineer, Structural: Not on file    Marital Status: Married   Family History  Problem Relation Age of Onset   Coronary artery disease Mother        CABG   CAD Sister    Bone cancer Sister    Alzheimer's disease Father    Aneurysm Brother        brain   Heart failure Sister    Bone cancer Sister    Colon cancer Neg Hx    Colon polyps Neg Hx    Esophageal cancer Neg Hx    Rectal cancer Neg Hx    Stomach cancer Neg Hx         Objective:   Physical Exam Vitals reviewed.  Constitutional:      General: He is not in acute distress.    Appearance: He is well-developed.  HENT:     Head: Normocephalic.      Right Ear: Tympanic membrane normal.     Left Ear: Tympanic membrane normal.  Eyes:     General:        Right eye: No discharge.        Left eye: No discharge.     Pupils: Pupils are equal, round, and reactive to light.  Neck:     Thyroid : No thyromegaly.  Cardiovascular:     Rate and Rhythm: Normal rate and regular rhythm.     Heart sounds: Normal heart sounds. No murmur heard. Pulmonary:     Effort: Pulmonary effort is normal. No respiratory distress.     Breath sounds: Normal breath sounds. No wheezing.  Abdominal:     General: Bowel sounds are normal. There is no distension.     Palpations: Abdomen is soft.     Tenderness: There is no abdominal tenderness.  Musculoskeletal:        General: No tenderness. Normal range of motion.     Cervical back: Normal range of motion and neck supple.  Skin:    General: Skin is warm and dry.     Findings: No erythema or rash.  Neurological:     Mental Status: He is alert and oriented to person, place, and time.     Cranial Nerves: No cranial nerve deficit.     Deep Tendon Reflexes: Reflexes are normal and symmetric.  Psychiatric:        Behavior: Behavior normal.        Thought Content: Thought content normal.        Judgment: Judgment normal.       BP 133/77   Pulse (!) 59   Temp (!) 97.1 F (36.2 C) (Temporal)   Ht 5' 9 (1.753 m)   Wt 214 lb 3.2 oz (97.2 kg)   SpO2 96%   BMI 31.63 kg/m      Assessment & Plan:  Ronald Berger comes in today with chief complaint of Diarrhea (Several times ago 6 times already today.) and Tick Removal   Diagnosis and orders addressed:  1. Diarrhea, unspecified type (Primary) Force fluids BRAT diet Imodium as needed after stool samples Good hand hygiene  Worrisome for C Diff, will bring stool samples asap - CMP14+EGFR - CBC with Differential/Platelet - Cdiff NAA+O+P+Stool Culture  2. Tick bite of abdominal wall, initial encounter -Pt to report any new fever, joint pain, or  rash -Wear protective clothing while outside- Long sleeves and long pants -Put insect repellent on all exposed skin and along clothing -Take a shower as soon  as possible after being outside Follow up if symptoms worsen or do not improve  - CMP14+EGFR - CBC with Differential/Platelet - Alpha-Gal Panel - [place] spotted fvr abs pnl(IgG+IgM) - Lyme Disease Serology w/Reflex       Bari Learn, FNP

## 2023-12-04 ENCOUNTER — Ambulatory Visit: Payer: Self-pay | Admitting: Family

## 2023-12-07 ENCOUNTER — Other Ambulatory Visit

## 2023-12-07 DIAGNOSIS — R197 Diarrhea, unspecified: Secondary | ICD-10-CM | POA: Diagnosis not present

## 2023-12-07 LAB — ALPHA-GAL PANEL
Allergen Lamb IgE: <0.1 kU/L
Beef IgE: <0.1 kU/L
IgE (Immunoglobulin E), Serum: 3 [IU]/mL — ABNORMAL LOW (ref 6–495)
O215-IgE Alpha-Gal: <0.1 kU/L
Pork IgE: <0.1 kU/L

## 2023-12-07 LAB — CMP14+EGFR
ALT: 35 IU/L (ref 0–44)
AST: 28 IU/L (ref 0–40)
Albumin: 3.8 g/dL (ref 3.8–4.8)
Alkaline Phosphatase: 98 IU/L (ref 47–123)
BUN/Creatinine Ratio: 19 (ref 10–24)
BUN: 21 mg/dL (ref 8–27)
Bilirubin Total: 0.5 mg/dL (ref 0.0–1.2)
CO2: 23 mmol/L (ref 20–29)
Calcium: 8.8 mg/dL (ref 8.6–10.2)
Chloride: 105 mmol/L (ref 96–106)
Creatinine, Ser: 1.12 mg/dL (ref 0.76–1.27)
Globulin, Total: 2.5 g/dL (ref 1.5–4.5)
Glucose: 93 mg/dL (ref 70–99)
Potassium: 4.2 mmol/L (ref 3.5–5.2)
Sodium: 140 mmol/L (ref 134–144)
Total Protein: 6.3 g/dL (ref 6.0–8.5)
eGFR: 68 mL/min/1.73 (ref >59)

## 2023-12-07 LAB — CBC WITH DIFFERENTIAL/PLATELET
Basophils Absolute: 0.1 x10E3/uL (ref 0.0–0.2)
Basos: 1 %
EOS (ABSOLUTE): 0.2 x10E3/uL (ref 0.0–0.4)
Eos: 3 %
Hematocrit: 46.2 % (ref 37.5–51.0)
Hemoglobin: 14.8 g/dL (ref 13.0–17.7)
Immature Grans (Abs): 0 x10E3/uL (ref 0.0–0.1)
Immature Granulocytes: 0 %
Lymphocytes Absolute: 2.7 x10E3/uL (ref 0.7–3.1)
Lymphs: 30 %
MCH: 28.6 pg (ref 26.6–33.0)
MCHC: 32 g/dL (ref 31.5–35.7)
MCV: 89 fL (ref 79–97)
Monocytes Absolute: 0.8 x10E3/uL (ref 0.1–0.9)
Monocytes: 9 %
Neutrophils Absolute: 5.2 x10E3/uL (ref 1.4–7.0)
Neutrophils: 57 %
Platelets: 251 x10E3/uL (ref 150–450)
RBC: 5.18 x10E6/uL (ref 4.14–5.80)
RDW: 12.5 % (ref 11.6–15.4)
WBC: 9.1 x10E3/uL (ref 3.4–10.8)

## 2023-12-07 LAB — LYME DISEASE SEROLOGY W/REFLEX: Lyme Total Antibody EIA: NEGATIVE

## 2023-12-07 LAB — SPOTTED FEVER GROUP ANTIBODIES
Spotted Fever Group IgG: 1:256 {titer} — ABNORMAL HIGH
Spotted Fever Group IgM: <1:64 {titer}

## 2023-12-08 DIAGNOSIS — H268 Other specified cataract: Secondary | ICD-10-CM | POA: Diagnosis not present

## 2023-12-08 DIAGNOSIS — H2512 Age-related nuclear cataract, left eye: Secondary | ICD-10-CM | POA: Diagnosis not present

## 2023-12-10 ENCOUNTER — Other Ambulatory Visit: Payer: Self-pay | Admitting: Family

## 2023-12-10 MED ORDER — VANCOMYCIN HCL 125 MG PO CAPS: 125.0000 mg | ORAL_CAPSULE | Freq: Four times a day (QID) | ORAL | 0 refills | Status: AC

## 2023-12-11 LAB — CDIFF NAA+O+P+STOOL CULTURE
E coli, Shiga toxin Assay: NEGATIVE
Toxigenic C. Difficile by PCR: POSITIVE — AB

## 2023-12-15 ENCOUNTER — Ambulatory Visit (INDEPENDENT_AMBULATORY_CARE_PROVIDER_SITE_OTHER): Admitting: Ophthalmology

## 2023-12-15 ENCOUNTER — Encounter (INDEPENDENT_AMBULATORY_CARE_PROVIDER_SITE_OTHER): Payer: Self-pay | Admitting: Ophthalmology

## 2023-12-15 DIAGNOSIS — H539 Unspecified visual disturbance: Secondary | ICD-10-CM

## 2023-12-15 DIAGNOSIS — H35033 Hypertensive retinopathy, bilateral: Secondary | ICD-10-CM | POA: Diagnosis not present

## 2023-12-15 DIAGNOSIS — I10 Essential (primary) hypertension: Secondary | ICD-10-CM | POA: Diagnosis not present

## 2023-12-15 DIAGNOSIS — Z961 Presence of intraocular lens: Secondary | ICD-10-CM | POA: Diagnosis not present

## 2023-12-15 DIAGNOSIS — H35373 Puckering of macula, bilateral: Secondary | ICD-10-CM

## 2023-12-19 ENCOUNTER — Other Ambulatory Visit: Payer: Self-pay | Admitting: Family

## 2023-12-19 DIAGNOSIS — J4530 Mild persistent asthma, uncomplicated: Secondary | ICD-10-CM

## 2023-12-20 ENCOUNTER — Other Ambulatory Visit: Payer: Self-pay | Admitting: Internal Medicine

## 2023-12-20 DIAGNOSIS — I2510 Atherosclerotic heart disease of native coronary artery without angina pectoris: Secondary | ICD-10-CM

## 2023-12-20 DIAGNOSIS — E785 Hyperlipidemia, unspecified: Secondary | ICD-10-CM

## 2023-12-20 DIAGNOSIS — I252 Old myocardial infarction: Secondary | ICD-10-CM

## 2024-01-01 ENCOUNTER — Ambulatory Visit: Payer: Self-pay

## 2024-01-01 ENCOUNTER — Other Ambulatory Visit: Payer: Self-pay | Admitting: Family

## 2024-01-01 NOTE — Telephone Encounter (Signed)
 I called and spoke with patient and got him scheduled to see PCP on Monday for follow up.

## 2024-01-01 NOTE — Telephone Encounter (Signed)
 FYI Only or Action Required?: Action required by provider: clinical question for provider and update on patient condition.  Patient was last seen in primary care on 12/03/2023 by Lavell Bari LABOR, FNP.  Called Nurse Triage reporting Diarrhea.  Symptoms began about a month ago.  Interventions attempted: Prescription medications: completed vanc.  Symptoms are: unchanged.  Triage Disposition: See Physician Within 24 Hours  Patient/caregiver understands and will follow disposition?: Yes   Reason for Triage: Patient is calling because he seen the provider once before about his bowels are loose and they are still the same as they were from his last visit. He is concerned. Could you assist? Patients callback number is [phone].   Reason for Disposition  [1] MODERATE diarrhea (e.g., 4-6 times / day more than normal) AND [2] age > 70 years  Answer Assessment - Initial Assessment Questions Pt called in stating that diarrhea has not improved much since being treated for Cdiff last month. Pt confirms he did complete abx as directed and felt better for about a week. States that he is having multiple loose stools a day, usual routine is once in am before work. Pt states he goes up to 3 times within a 45 minute period before work. Discussed I would send to PCP, unsure if he would need to complete another stool sample at this time. Please advise.    1. DIARRHEA SEVERITY: How bad is the diarrhea? How many more stools have you had in the past 24 hours than normal?      Pt tx'ed for Cdiff last month; states he felt better for about a week but now diarrhea is back. States that he has 3 BM in the am before work then periodically throughout the day. Denies foul odor as before, no blood or straining   2. ONSET: When did the diarrhea begin?      Ongoing since November   3. STOOL DESCRIPTION:  How loose or watery is the diarrhea? What is the stool color? Is there any blood or mucous in the  stool?     Loose, brown; denies blood   4. VOMITING: Are you also vomiting? If Yes, ask: How many times in the past 24 hours?      None   5. ABDOMEN PAIN: Are you having any abdomen pain? If Yes, ask: What does it feel like? (e.g., crampy, dull, intermittent, constant)      None   6. ABDOMEN PAIN SEVERITY: If present, ask: How bad is the pain?  (e.g., Scale 1-10; mild, moderate, or severe)     None   7. ORAL INTAKE: If vomiting, Have you been able to drink liquids? How much liquids have you had in the past 24 hours?     None   8. HYDRATION: Any signs of dehydration? (e.g., dry mouth [not just dry lips], too weak to stand, dizziness, new weight loss) When did you last urinate?     Yes; pt reports increasing fluids to aid with flushing system   10. ANTIBIOTIC USE: Are you taking antibiotics now or have you taken antibiotics in the past 2 months?       Completed vanc from previous Cdiff 11/2023  11. OTHER SYMPTOMS: Do you have any other symptoms? (e.g., fever, blood in stool)       None  Protocols used: Diarrhea-A-AH

## 2024-01-04 ENCOUNTER — Ambulatory Visit: Admitting: Family

## 2024-01-04 ENCOUNTER — Encounter: Payer: Self-pay | Admitting: Family

## 2024-01-04 VITALS — BP 127/71 | HR 59 | Temp 96.6°F | Ht 69.0 in | Wt 221.4 lb

## 2024-01-04 DIAGNOSIS — I252 Old myocardial infarction: Secondary | ICD-10-CM | POA: Diagnosis not present

## 2024-01-04 DIAGNOSIS — J4541 Moderate persistent asthma with (acute) exacerbation: Secondary | ICD-10-CM

## 2024-01-04 DIAGNOSIS — G4733 Obstructive sleep apnea (adult) (pediatric): Secondary | ICD-10-CM

## 2024-01-04 DIAGNOSIS — R7303 Prediabetes: Secondary | ICD-10-CM | POA: Diagnosis not present

## 2024-01-04 DIAGNOSIS — I10 Essential (primary) hypertension: Secondary | ICD-10-CM | POA: Diagnosis not present

## 2024-01-04 DIAGNOSIS — K219 Gastro-esophageal reflux disease without esophagitis: Secondary | ICD-10-CM | POA: Diagnosis not present

## 2024-01-04 DIAGNOSIS — F5101 Primary insomnia: Secondary | ICD-10-CM | POA: Diagnosis not present

## 2024-01-04 DIAGNOSIS — E785 Hyperlipidemia, unspecified: Secondary | ICD-10-CM | POA: Diagnosis not present

## 2024-01-04 DIAGNOSIS — I2510 Atherosclerotic heart disease of native coronary artery without angina pectoris: Secondary | ICD-10-CM

## 2024-01-04 DIAGNOSIS — R197 Diarrhea, unspecified: Secondary | ICD-10-CM | POA: Diagnosis not present

## 2024-01-04 LAB — BAYER DCA HB A1C WAIVED: HB A1C (BAYER DCA - WAIVED): 5.5 % (ref 4.8–5.6)

## 2024-01-04 NOTE — Progress Notes (Signed)
 Subjective:    Patient ID: Ronald Berger, male    DOB: [birth year], 78 y.o.   MRN: [id number]  Chief Complaint  Patient presents with   Medical Management of Chronic Issues   Diarrhea   Pt presents to the office today chronic follow up.   He is followed by nephrologists for every 6 months for kidneys stones.   Followed by Ortho every 6 month for chronic neck pain. Getting steroid injections.    He has CAD and has hs NSTEMI and takes atorvastatin  daily. Followed by Cardiologists every other year.    Has OSA, but does not wear CPAP. Has been years since last sleep study.     Hypertension This is a chronic problem. The current episode started more than 1 year ago. The problem has been resolved since onset. The problem is controlled. Associated symptoms include malaise/fatigue and neck pain. Pertinent negatives include no blurred vision, peripheral edema or shortness of breath. Risk factors for coronary artery disease include obesity, male gender and dyslipidemia. The current treatment provides moderate improvement.  Asthma He complains of wheezing (Some times). There is no cough or shortness of breath. This is a chronic problem. The current episode started more than 1 year ago. The problem occurs intermittently. The problem has been waxing and waning. Associated symptoms include heartburn and malaise/fatigue. His symptoms are alleviated by beta-agonist and leukotriene antagonist. He reports moderate improvement on treatment. His past medical history is significant for asthma.  Gastroesophageal Reflux He complains of belching, heartburn and wheezing (Some times). He reports no coughing. This is a chronic problem. The current episode started more than 1 year ago. The problem occurs rarely. The symptoms are aggravated by certain foods. Risk factors include obesity. He has tried a PPI for the symptoms. The treatment provided moderate relief.  Hyperlipidemia This is a chronic problem. The  current episode started more than 1 year ago. The problem is controlled. Recent lipid tests were reviewed and are normal. Exacerbating diseases include obesity. Pertinent negatives include no shortness of breath. Current antihyperlipidemic treatment includes statins. The current treatment provides moderate improvement of lipids. Risk factors for coronary artery disease include dyslipidemia, diabetes mellitus, male sex and hypertension.  Diabetes He presents for his follow-up diabetic visit. Diabetes type: prediabetic. Pertinent negatives for diabetes include no blurred vision and no foot paresthesias. Symptoms are stable. Risk factors for coronary artery disease include hypertension and male sex. He is following a generally healthy diet. (Does not check at home)  Neck Pain  This is a chronic problem. The current episode started more than 1 year ago. The problem occurs intermittently. The problem has been waxing and waning. The pain is at a severity of 3/10. The pain is mild. He has tried NSAIDs for the symptoms. The treatment provided mild relief.  Insomnia Primary symptoms: difficulty falling asleep, frequent awakening, malaise/fatigue.   Past treatments include medication. The treatment provided moderate relief.  Diarrhea  This is a recurrent problem. The current episode started more than 1 month ago. The problem occurs 5 to 10 times per day. The problem has been waxing and waning. The stool consistency is described as Watery. Pertinent negatives include no coughing. He has tried anti-motility drug for the symptoms. The treatment provided mild relief.      Review of Systems  Constitutional:  Positive for malaise/fatigue.  Eyes:  Negative for blurred vision.  Respiratory:  Positive for wheezing (Some times). Negative for cough and shortness of breath.   Gastrointestinal:  Positive for diarrhea and heartburn.  Musculoskeletal:  Positive for neck pain.  All other systems reviewed and are  negative.  Family History  Problem Relation Age of Onset   Coronary artery disease Mother        CABG   CAD Sister    Bone cancer Sister    Alzheimer's disease Father    Aneurysm Brother        brain   Heart failure Sister    Bone cancer Sister    Colon cancer Neg Hx    Colon polyps Neg Hx    Esophageal cancer Neg Hx    Rectal cancer Neg Hx    Stomach cancer Neg Hx    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  Tobacco Use   Smoking status: Former    Current packs/day: 0.00    Types: Cigarettes    Start date: 04/26/1965    Quit date: 04/27/1966    Years since quitting: 57.7   Smokeless tobacco: Never  Vaping Use   Vaping status: Never Used  Substance and Sexual Activity   Alcohol use: Not Currently    Comment: occasional   Drug use: No   Sexual activity: Yes  Other Topics Concern   Not on file  Social History Narrative   Son lives with them   One level home   Social Drivers of Health   Tobacco Use: Medium Risk (01/04/2024)   Patient History    Smoking Tobacco Use: Former    Smokeless Tobacco Use: Never    Passive Exposure: Not on Actuary Strain: Low Risk (11/09/2023)   Overall Financial Resource Strain (CARDIA)    Difficulty of Paying Living Expenses: Not hard at all  Food Insecurity: No Food Insecurity (11/09/2023)   Epic    Worried About Programme Researcher, Broadcasting/film/video in the Last Year: Never true    Ran Out of Food in the Last Year: Never true  Transportation Needs: No Transportation Needs (11/09/2023)   Epic    Lack of Transportation (Medical): No    Lack of Transportation (Non-Medical): No  Physical Activity: Sufficiently Active (11/09/2023)   Exercise  Vital Sign    Days of Exercise per Week: 5 days    Minutes of Exercise per Session: 30 min  Stress: No Stress Concern Present (11/09/2023)   [employer] of Occupational Health - Occupational Stress Questionnaire    Feeling of Stress: Not at all  Social Connections: Moderately Isolated (11/09/2023)   Social Connection and Isolation Panel    Frequency of Communication with Friends and Family: More than three times a week    Frequency of Social Gatherings with Friends and Family: Once a week    Attends Religious Services: Never    Database Administrator or Organizations: No    Attends Banker Meetings: Not on file    Marital Status: Married  Depression (PHQ2-9): Low Risk (01/04/2024)   Depression (PHQ2-9)    PHQ-2 Score: 0  Alcohol Screen: Low Risk (10/04/2023)   Alcohol Screen    Last Alcohol Screening Score (AUDIT): 1  Housing: Low Risk (11/09/2023)   Epic    Unable to Pay for Housing in  the Last Year: No    Number of Times Moved in the Last Year: 0    Homeless in the Last Year: No  Utilities: Not At Risk (11/10/2023)   Epic    Threatened with loss of utilities: No  Health Literacy: Adequate Health Literacy (11/10/2023)   B1300 Health Literacy    Frequency of need for help with medical instructions: Never       Objective:   Physical Exam Vitals reviewed.  Constitutional:      General: He is not in acute distress.    Appearance: He is well-developed. He is obese.  HENT:     Head: Normocephalic.     Right Ear: Tympanic membrane normal.     Left Ear: Tympanic membrane normal.  Eyes:     General:        Right eye: No discharge.        Left eye: No discharge.     Pupils: Pupils are equal, round, and reactive to light.  Neck:     Thyroid : No thyromegaly.  Cardiovascular:     Rate and Rhythm: Normal rate and regular rhythm.     Heart sounds: Normal heart sounds. No murmur heard. Pulmonary:     Effort: Pulmonary effort is normal. No respiratory  distress.     Breath sounds: Normal breath sounds. No wheezing.  Abdominal:     General: Bowel sounds are normal. There is no distension.     Palpations: Abdomen is soft.     Tenderness: There is no abdominal tenderness.  Musculoskeletal:        General: No tenderness. Normal range of motion.     Cervical back: Normal range of motion and neck supple.     Right lower leg: Edema (trace) present.     Left lower leg: Edema (trace) present.  Skin:    General: Skin is warm and dry.     Findings: No erythema or rash.  Neurological:     Mental Status: He is alert and oriented to person, place, and time.     Cranial Nerves: No cranial nerve deficit.     Deep Tendon Reflexes: Reflexes are normal and symmetric.  Psychiatric:        Behavior: Behavior normal.        Thought Content: Thought content normal.        Judgment: Judgment normal.       BP 127/71   Pulse (!) 59   Temp (!) 96.6 F (35.9 C) (Temporal)   Ht 5' 9 (1.753 m)   Wt 221 lb 6.4 oz (100.4 kg)   BMI 32.70 kg/m      Assessment & Plan:  ELSTON ALDAPE comes in today with chief complaint of Medical Management of Chronic Issues and Diarrhea   Diagnosis and orders addressed:  1. CAD in native artery - CMP14+EGFR - CBC with Differential/Platelet  2. Gastroesophageal reflux disease, unspecified whether esophagitis present - CMP14+EGFR - CBC with Differential/Platelet  3. Hyperlipidemia LDL goal <70 - CMP14+EGFR - CBC with Differential/Platelet  4. History of non-ST elevation myocardial infarction (NSTEMI) - CMP14+EGFR - CBC with Differential/Platelet  5. Primary insomnia - CMP14+EGFR - CBC with Differential/Platelet  6. OSA (obstructive sleep apnea) - CMP14+EGFR - CBC with Differential/Platelet  7. Moderate persistent asthma with exacerbation - CMP14+EGFR - CBC with Differential/Platelet  8. Prediabetes - Bayer DCA Hb A1c Waived - CMP14+EGFR - CBC with Differential/Platelet  9. Primary  hypertension (Primary)  - CMP14+EGFR - CBC with Differential/Platelet  10. Diarrhea, unspecified type C Diff pending  Force fluids Imodium  - CMP14+EGFR - CBC with Differential/Platelet - Clostridium difficile EIA    Labs pending Continue current medications  Keep follow up with specialists  Health Maintenance reviewed Diet and exercise encouraged  Follow up plan: 6 months    Bari Learn, FNP

## 2024-01-04 NOTE — Patient Instructions (Signed)
C. Diff Infection C. diff infection, or C. diff, is caused by germs (bacteria) called Clostridioides difficile. These germs cause diarrhea and very bad inflammation in your colon. This infection often happens after taking antibiotics. C. diff can spread easily to others, usually from touching or swallowing something that has the germ on it. What are the causes? Common causes of C. diff include: Taking antibiotics. Coming in contact with people, food, or things that have the germ on it. What increases the risk? You may be more likely to get C. diff if you: Take antibiotics that kill many types of germs or take antibiotics for a long time. Stay in a hospital or nursing home for a long time. Are age 87 or older. Have had it before or have been in contact with C. diff germs. Have a weak disease-fighting, or immune, system. Have a serious health condition, such as colon cancer or inflammatory bowel disease (IBD). Take medicines that treat stomach acid. Have had a surgery on your digestive system. What are the signs or symptoms? Diarrhea at least 3 times a day for many days. Fever. Nausea. Not feeling hungry. Swelling, pain, cramping, or tenderness in the belly, or abdomen. How is this diagnosed? C. diff is diagnosed with: Your medical history and a physical exam. Tests such as: A test that checks your poop (stool) for C. diff. Blood tests. Imaging tests of your intestines, such as a CT scan or X-ray. A procedure to look at your colon. This is rare. How is this treated? Treatment for C. diff may include: Stopping the antibiotics that caused C. diff. Taking antibiotics that kill C. diff. Placing poop from a healthy person into your colon. This is called a fecal transplant. It may be done if C. diff keeps coming back. Having surgery to take out the infected part of the colon. This is rare. Follow these instructions at home: Medicines Take over-the-counter and prescription medicines only  as told by your health care provider. Take your antibiotics as told by your provider. Do not stop taking your antibiotics even if you start to feel better. Do not take medicines that treat diarrhea unless your provider tells you to. Eating and drinking Follow instructions from your provider about what you may eat or drink. Eat bland foods in small amounts that are easy to digest. These include bananas, applesauce, rice, and toast. Avoid milk, caffeine, and alcohol. Replace any body fluid that has been lost. You can do this by: Drinking clear fluids to keep your pee (urine) pale yellow. This includes water, clear fruit juice with water added to it, and low-calorie sports drinks. Sucking on ice chips. Taking an oral rehydration solution (ORS). This drink is sold at pharmacies and retail stores. General instructions Wash your hands often with soap and water for at least 20 seconds. Take a bath or shower every day. Return to your normal activities as told by your provider. Ask your provider what activities are safe for you. Be sure your home is clean before you leave the hospital or clinic. Clean every day for at least a week. Keep all follow-up visits to make sure the infection is gone. How is this prevented? Wash [employer] your hands with soap and water for at least 20 seconds. Wash your hands before cooking and after using the bathroom. Other people should wash their hands too, especially: People who live with you. People who visit you. Stop germs from spreading Tell your provider right away if you get  diarrhea while in a hospital or nursing home. When you visit someone in a hospital or nursing home, wear a gown, gloves, or other protection. Try to stay away from people who have diarrhea. Try to use a different bathroom if you're sick and live with other people. Clean surfaces Clean surfaces that you touch every day. Use a product that has a 10% chlorine bleach solution. Be sure  to: Read the product label to make sure it will kill germs. Clean toilets and flush handles, bathtubs, sinks, doorknobs and handles, countertops, and work surfaces. If you're in the hospital, make sure the surfaces in your room are cleaned each day. Tell someone right away if body fluids have splashed or spilled. Washing clothes and linens Wash clothes and linens using laundry soap that has chlorine bleach. Be sure to: Use powder soap instead of liquid. Clean your washing machine once a month. To do this, turn on the hot setting with only soap in it. Contact a health care provider if: Your symptoms do not get better or get worse. Your symptoms go away and then come back. You have a fever. You have new symptoms. Get help right away if: You have more pain or tenderness in your belly. You have poop that's bloody or looks black and tarry. You vomit every time you eat or drink. You have signs of not having enough fluids in your body. These include: Dark yellow pee, very little pee, or no pee. Cracked lips or dry mouth. No tears when you cry. Sunken eyes. Feeling tired. Feeling weak or dizzy. This information is not intended to replace advice given to you by your health care provider. Make sure you discuss any questions you have with your health care provider. Document Revised: 03/31/2022 Document Reviewed: 03/31/2022 Elsevier Patient Education  2024 [employer].

## 2024-01-05 ENCOUNTER — Ambulatory Visit: Payer: Self-pay | Admitting: Family

## 2024-01-05 LAB — CBC WITH DIFFERENTIAL/PLATELET
Basophils Absolute: 0.1 x10E3/uL (ref 0.0–0.2)
Basos: 1 %
EOS (ABSOLUTE): 0.3 x10E3/uL (ref 0.0–0.4)
Eos: 3 %
Hematocrit: 42.6 % (ref 37.5–51.0)
Hemoglobin: 14.1 g/dL (ref 13.0–17.7)
Immature Grans (Abs): 0 x10E3/uL (ref 0.0–0.1)
Immature Granulocytes: 0 %
Lymphocytes Absolute: 2.8 x10E3/uL (ref 0.7–3.1)
Lymphs: 35 %
MCH: 29 pg (ref 26.6–33.0)
MCHC: 33.1 g/dL (ref 31.5–35.7)
MCV: 88 fL (ref 79–97)
Monocytes Absolute: 0.7 x10E3/uL (ref 0.1–0.9)
Monocytes: 9 %
Neutrophils Absolute: 4.1 x10E3/uL (ref 1.4–7.0)
Neutrophils: 52 %
Platelets: 213 x10E3/uL (ref 150–450)
RBC: 4.87 x10E6/uL (ref 4.14–5.80)
RDW: 12.6 % (ref 11.6–15.4)
WBC: 8 x10E3/uL (ref 3.4–10.8)

## 2024-01-05 LAB — CMP14+EGFR
ALT: 43 IU/L (ref 0–44)
AST: 24 IU/L (ref 0–40)
Albumin: 3.9 g/dL (ref 3.8–4.8)
Alkaline Phosphatase: 88 IU/L (ref 47–123)
BUN/Creatinine Ratio: 19 (ref 10–24)
BUN: 19 mg/dL (ref 8–27)
Bilirubin Total: 0.5 mg/dL (ref 0.0–1.2)
CO2: 23 mmol/L (ref 20–29)
Calcium: 8.9 mg/dL (ref 8.6–10.2)
Chloride: 104 mmol/L (ref 96–106)
Creatinine, Ser: 1.02 mg/dL (ref 0.76–1.27)
Globulin, Total: 2.6 g/dL (ref 1.5–4.5)
Glucose: 83 mg/dL (ref 70–99)
Potassium: 4.2 mmol/L (ref 3.5–5.2)
Sodium: 141 mmol/L (ref 134–144)
Total Protein: 6.5 g/dL (ref 6.0–8.5)
eGFR: 76 mL/min/1.73 (ref >59)

## 2024-01-06 ENCOUNTER — Other Ambulatory Visit: Payer: Self-pay | Admitting: Family

## 2024-01-06 DIAGNOSIS — F5101 Primary insomnia: Secondary | ICD-10-CM

## 2024-01-07 ENCOUNTER — Other Ambulatory Visit: Payer: Self-pay | Admitting: Family

## 2024-01-07 ENCOUNTER — Other Ambulatory Visit

## 2024-01-09 LAB — CLOSTRIDIUM DIFFICILE EIA: C difficile Toxins A+B, EIA: NEGATIVE

## 2024-01-11 ENCOUNTER — Ambulatory Visit: Payer: Self-pay | Admitting: Family

## 2024-01-11 ENCOUNTER — Other Ambulatory Visit: Payer: Self-pay | Admitting: Family

## 2024-01-11 DIAGNOSIS — J4540 Moderate persistent asthma, uncomplicated: Secondary | ICD-10-CM

## 2024-01-11 DIAGNOSIS — R6889 Other general symptoms and signs: Secondary | ICD-10-CM

## 2024-01-28 ENCOUNTER — Other Ambulatory Visit: Payer: Self-pay | Admitting: Urology

## 2024-01-28 ENCOUNTER — Other Ambulatory Visit: Payer: Self-pay | Admitting: Family

## 2024-01-28 DIAGNOSIS — M542 Cervicalgia: Secondary | ICD-10-CM

## 2024-01-28 DIAGNOSIS — N138 Other obstructive and reflux uropathy: Secondary | ICD-10-CM

## 2024-02-03 ENCOUNTER — Other Ambulatory Visit: Payer: Self-pay | Admitting: Family

## 2024-02-03 DIAGNOSIS — J4530 Mild persistent asthma, uncomplicated: Secondary | ICD-10-CM

## 2024-02-03 DIAGNOSIS — J301 Allergic rhinitis due to pollen: Secondary | ICD-10-CM

## 2024-02-08 ENCOUNTER — Encounter: Payer: Self-pay | Admitting: Family

## 2024-02-08 ENCOUNTER — Ambulatory Visit (INDEPENDENT_AMBULATORY_CARE_PROVIDER_SITE_OTHER): Payer: Self-pay | Admitting: Family

## 2024-02-08 VITALS — BP 124/78 | HR 56 | Temp 97.1°F | Ht 69.0 in | Wt 219.8 lb

## 2024-02-08 DIAGNOSIS — R197 Diarrhea, unspecified: Secondary | ICD-10-CM | POA: Diagnosis not present

## 2024-02-08 DIAGNOSIS — Z87442 Personal history of urinary calculi: Secondary | ICD-10-CM

## 2024-02-08 DIAGNOSIS — Z8619 Personal history of other infectious and parasitic diseases: Secondary | ICD-10-CM

## 2024-02-08 NOTE — Patient Instructions (Signed)
 Diarrhea, Adult Diarrhea is frequent loose and sometimes watery bowel movements. Diarrhea can make you feel weak and cause you to become dehydrated. Dehydration is a condition in which there is not enough water or other fluids in the body. Dehydration can make you tired and thirsty, cause you to have a dry mouth, and decrease how often you urinate. Diarrhea typically lasts 2-3 days. However, it can last longer if it is a sign of something more serious. It is important to treat your diarrhea as told by your health care provider. Follow these instructions at home: Eating and drinking     Follow these recommendations as told by your health care provider: Take an oral rehydration solution (ORS). This is an over-the-counter medicine that helps return your body to its normal balance of nutrients and water. It is found at pharmacies and retail stores. Drink enough fluid to keep your urine pale yellow. Drink fluids such as water, diluted fruit juice, and low-calorie sports drinks. You can drink milk also, if desired. Sucking on ice chips is another way to get fluids. Avoid drinking fluids that contain a lot of sugar or caffeine, such as soda, energy drinks, and regular sports drinks. Avoid alcohol. Eat bland, easy-to-digest foods in small amounts as you are able. These foods include bananas, applesauce, rice, lean meats, toast, and crackers. Avoid spicy or fatty foods.  Medicines Take over-the-counter and prescription medicines only as told by your health care provider. If you were prescribed antibiotics, take them as told by your health care provider. Do not stop using the antibiotic even if you start to feel better. General instructions  Wash your hands often using soap and water for at least 20 seconds. If soap and water are not available, use hand sanitizer. Others in the household should wash their hands as well. Hands should be washed: After using the toilet or changing a diaper. Before  preparing, cooking, or serving food. While caring for a sick person or while visiting someone in a hospital. Rest at home while you recover. Take a warm bath to relieve any burning or pain from frequent diarrhea episodes. Watch your condition for any changes. Contact a health care provider if: You have a fever. Your diarrhea gets worse. You have new symptoms. You vomit every time you eat or drink. You feel light-headed, dizzy, or have a headache. You have muscle cramps. You have signs of dehydration, such as: Dark urine, very little urine, or no urine. Cracked lips. Dry mouth. Sunken eyes. Sleepiness. Weakness. You have bloody or black stools or stools that look like tar. You have severe pain, cramping, or bloating in your abdomen. Your skin feels cold and clammy. You feel confused. Get help right away if: You have chest pain or your heart is beating very quickly. You have trouble breathing or you are breathing very quickly. You feel extremely weak or you faint. These symptoms may be an emergency. Get help right away. Call 911. Do not wait to see if the symptoms will go away. Do not drive yourself to the hospital. This information is not intended to replace advice given to you by your health care provider. Make sure you discuss any questions you have with your health care provider. Document Revised: 06/25/2021 Document Reviewed: 06/25/2021 Elsevier Patient Education  2024 [employer].

## 2024-02-08 NOTE — Progress Notes (Signed)
 "  Subjective:    Patient ID: Ronald Berger, male    DOB: 05-05-1945, 79 y.o.   MRN: [id number]  Chief Complaint  Patient presents with   Follow-up    Diarrhea still there 2-3 times in the morning. Hits every so often 9 pm   PT presents to the office today with recurrent diarrhea. He had C diff 12/07/23 and completed Oral vancomycin . He continued to have diarrhea and was seen on  01/03/14 and had a negative C Diff at that time. His CBC was stable.   Reports he has 2-3 episodes of diarrhea every morning since C Diff.   He has hx of kidney stones. Has passed 2 stones in the last month. Wants a new referral to a different Urologists.  Diarrhea  This is a recurrent problem. The current episode started more than 1 month ago. The problem occurs 2 to 4 times per day. The problem has been unchanged. The stool consistency is described as Watery and mucous. Pertinent negatives include no arthralgias, bloating, chills, coughing, fever, headaches or increased  flatus. He has tried anti-motility drug and increased fluids (fiber) for the symptoms. The treatment provided mild relief.      Review of Systems  Constitutional:  Negative for chills and fever.  Respiratory:  Negative for cough.   Gastrointestinal:  Positive for diarrhea. Negative for bloating and flatus.  Musculoskeletal:  Negative for arthralgias.  Neurological:  Negative for headaches.  All other systems reviewed and are negative.   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  Tobacco Use   Smoking status: Former    Current packs/day: 0.00    Types: Cigarettes    Start date: 04/26/1965    Quit date: 04/27/1966    Years since quitting: 57.8    Smokeless tobacco: Never  Vaping Use   Vaping status: Never Used  Substance and Sexual Activity   Alcohol use: Not Currently    Comment: occasional   Drug use: No   Sexual activity: Yes  Other Topics Concern   Not on file  Social History Narrative   Son lives with them   One level home   Social Drivers of Health   Tobacco Use: Medium Risk (02/08/2024)   Patient History    Smoking Tobacco Use: Former    Smokeless Tobacco Use: Never    Passive Exposure: Not on Actuary Strain: Low Risk (11/09/2023)   Overall Financial Resource Strain (CARDIA)    Difficulty of Paying Living Expenses: Not hard at all  Food Insecurity: No Food Insecurity (11/09/2023)   Epic    Worried About Programme Researcher, Broadcasting/film/video in the Last Year: Never true    Ran Out of Food in the Last Year: Never true  Transportation Needs: No Transportation Needs (11/09/2023)   Epic    Lack of Transportation (Medical): No    Lack of Transportation (Non-Medical): No  Physical Activity: Sufficiently Active (11/09/2023)   Exercise Vital Sign    Days of Exercise per Week: 5 days    Minutes of Exercise per Session: 30 min  Stress: No Stress Concern Present (11/09/2023)   [employer] of Occupational Health - Occupational Stress Questionnaire  Feeling of Stress: Not at all  Social Connections: Moderately Isolated (11/09/2023)   Social Connection and Isolation Panel    Frequency of Communication with Friends and Family: More than three times a week    Frequency of Social Gatherings with Friends and Family: Once a week    Attends Religious Services: Never    Database Administrator or Organizations: No    Attends Engineer, Structural: Not on file    Marital Status: Married  Depression (PHQ2-9): Low Risk (02/08/2024)   Depression (PHQ2-9)    PHQ-2 Score: 0  Alcohol Screen: Low Risk (10/04/2023)   Alcohol Screen    Last Alcohol Screening Score (AUDIT): 1  Housing: Low Risk (11/09/2023)   Epic     Unable to Pay for Housing in the Last Year: No    Number of Times Moved in the Last Year: 0    Homeless in the Last Year: No  Utilities: Not At Risk (11/10/2023)   Epic    Threatened with loss of utilities: No  Health Literacy: Adequate Health Literacy (11/10/2023)   B1300 Health Literacy    Frequency of need for help with medical instructions: Never   Family History  Problem Relation Age of Onset   Coronary artery disease Mother        CABG   CAD Sister    Bone cancer Sister    Alzheimer's disease Father    Aneurysm Brother        brain   Heart failure Sister    Bone cancer Sister    Colon cancer Neg Hx    Colon polyps Neg Hx    Esophageal cancer Neg Hx    Rectal cancer Neg Hx    Stomach cancer Neg Hx         Objective:   Physical Exam Vitals reviewed.  Constitutional:      General: He is not in acute distress.    Appearance: He is well-developed.  HENT:     Head: Normocephalic.     Right Ear: Tympanic membrane normal.     Left Ear: Tympanic membrane normal.  Eyes:     General:        Right eye: No discharge.        Left eye: No discharge.     Pupils: Pupils are equal, round, and reactive to light.  Neck:     Thyroid : No thyromegaly.  Cardiovascular:     Rate and Rhythm: Normal rate and regular rhythm.     Heart sounds: Normal heart sounds. No murmur heard. Pulmonary:     Effort: Pulmonary effort is normal. No respiratory distress.     Breath sounds: Normal breath sounds. No wheezing.  Abdominal:     General: Bowel sounds are normal. There is no distension.     Palpations: Abdomen is soft.     Tenderness: There is no abdominal tenderness.  Musculoskeletal:        General: No tenderness. Normal range of motion.     Cervical back: Normal range of motion and neck supple.  Skin:    General: Skin is warm and dry.     Findings: No erythema or rash.  Neurological:     Mental Status: He is alert and oriented to person, place, and time.     Cranial Nerves: No  cranial nerve deficit.     Deep Tendon Reflexes: Reflexes are normal and symmetric.  Psychiatric:        Behavior: Behavior normal.  Thought Content: Thought content normal.        Judgment: Judgment normal.       BP 124/78   Pulse (!) 56   Temp (!) 97.1 F (36.2 C) (Temporal)   Ht 5' 9 (1.753 m)   Wt 219 lb 12.8 oz (99.7 kg)   BMI 32.46 kg/m      Assessment & Plan:  THOAMS SIEFERT comes in today with chief complaint of Follow-up (Diarrhea still there 2-3 times in the morning. Hits every so often 9 pm)   Diagnosis and orders addressed:  1. Diarrhea, unspecified type (Primary) Imodium as needed  Increase fiber in det Force fluids Referral GI - Ambulatory referral to Gastroenterology  2. H/O Clostridium difficile infection - Ambulatory referral to Gastroenterology  3. History of kidney stones Force fluids Referral to Urologists  - Ambulatory referral to Urology      Bari Learn, FNP   "

## 2024-05-02 ENCOUNTER — Ambulatory Visit: Admitting: Urology

## 2024-09-13 ENCOUNTER — Encounter (INDEPENDENT_AMBULATORY_CARE_PROVIDER_SITE_OTHER): Admitting: Ophthalmology
